# Patient Record
Sex: Female | Born: 1935 | Race: White | Hispanic: No | Smoking: Never smoker
Health system: Southern US, Community
[De-identification: ages and names within clinical notes are randomized; demographics above are authoritative.]

## PROBLEM LIST (undated history)

## (undated) DIAGNOSIS — I739 Peripheral vascular disease, unspecified: Secondary | ICD-10-CM

## (undated) DIAGNOSIS — I4891 Unspecified atrial fibrillation: Secondary | ICD-10-CM

## (undated) DIAGNOSIS — E7800 Pure hypercholesterolemia, unspecified: Secondary | ICD-10-CM

## (undated) DIAGNOSIS — F419 Anxiety disorder, unspecified: Secondary | ICD-10-CM

## (undated) DIAGNOSIS — I4819 Other persistent atrial fibrillation: Secondary | ICD-10-CM

## (undated) DIAGNOSIS — R609 Edema, unspecified: Secondary | ICD-10-CM

## (undated) DIAGNOSIS — K5730 Diverticulosis of large intestine without perforation or abscess without bleeding: Secondary | ICD-10-CM

## (undated) DIAGNOSIS — K449 Diaphragmatic hernia without obstruction or gangrene: Secondary | ICD-10-CM

## (undated) DIAGNOSIS — IMO0001 Reserved for inherently not codable concepts without codable children: Secondary | ICD-10-CM

## (undated) DIAGNOSIS — J8410 Pulmonary fibrosis, unspecified: Secondary | ICD-10-CM

## (undated) DIAGNOSIS — I872 Venous insufficiency (chronic) (peripheral): Secondary | ICD-10-CM

## (undated) DIAGNOSIS — I10 Essential (primary) hypertension: Secondary | ICD-10-CM

## (undated) DIAGNOSIS — M1990 Unspecified osteoarthritis, unspecified site: Secondary | ICD-10-CM

## (undated) DIAGNOSIS — Z8701 Personal history of pneumonia (recurrent): Secondary | ICD-10-CM

## (undated) DIAGNOSIS — C20 Malignant neoplasm of rectum: Secondary | ICD-10-CM

## (undated) DIAGNOSIS — M8580 Other specified disorders of bone density and structure, unspecified site: Secondary | ICD-10-CM

## (undated) DIAGNOSIS — R0989 Other specified symptoms and signs involving the circulatory and respiratory systems: Secondary | ICD-10-CM

## (undated) DIAGNOSIS — I341 Nonrheumatic mitral (valve) prolapse: Secondary | ICD-10-CM

## (undated) DIAGNOSIS — J45909 Unspecified asthma, uncomplicated: Secondary | ICD-10-CM

## (undated) DIAGNOSIS — Z950 Presence of cardiac pacemaker: Secondary | ICD-10-CM

## (undated) DIAGNOSIS — Z789 Other specified health status: Secondary | ICD-10-CM

## (undated) DIAGNOSIS — K219 Gastro-esophageal reflux disease without esophagitis: Secondary | ICD-10-CM

## (undated) DIAGNOSIS — I059 Rheumatic mitral valve disease, unspecified: Secondary | ICD-10-CM

## (undated) DIAGNOSIS — Z5189 Encounter for other specified aftercare: Secondary | ICD-10-CM

## (undated) HISTORY — DX: Diaphragmatic hernia without obstruction or gangrene: K44.9

## (undated) HISTORY — DX: Venous insufficiency (chronic) (peripheral): I87.2

## (undated) HISTORY — DX: Personal history of pneumonia (recurrent): Z87.01

## (undated) HISTORY — DX: Peripheral vascular disease, unspecified: I73.9

## (undated) HISTORY — DX: Rheumatic mitral valve disease, unspecified: I05.9

## (undated) HISTORY — PX: COLON SURGERY: SHX602

## (undated) HISTORY — DX: Malignant neoplasm of rectum: C20

## (undated) HISTORY — DX: Essential (primary) hypertension: I10

## (undated) HISTORY — DX: Reserved for inherently not codable concepts without codable children: IMO0001

## (undated) HISTORY — DX: Nonrheumatic mitral (valve) prolapse: I34.1

## (undated) HISTORY — DX: Pulmonary fibrosis, unspecified: J84.10

## (undated) HISTORY — DX: Anxiety disorder, unspecified: F41.9

## (undated) HISTORY — DX: Unspecified atrial fibrillation: I48.91

## (undated) HISTORY — DX: Other specified symptoms and signs involving the circulatory and respiratory systems: R09.89

## (undated) HISTORY — DX: Pure hypercholesterolemia, unspecified: E78.00

## (undated) HISTORY — DX: Presence of cardiac pacemaker: Z95.0

## (undated) HISTORY — DX: Diverticulosis of large intestine without perforation or abscess without bleeding: K57.30

## (undated) HISTORY — PX: APPENDECTOMY: SHX54

## (undated) HISTORY — DX: Unspecified asthma, uncomplicated: J45.909

## (undated) HISTORY — DX: Unspecified osteoarthritis, unspecified site: M19.90

## (undated) HISTORY — DX: Gastro-esophageal reflux disease without esophagitis: K21.9

## (undated) HISTORY — DX: Other specified disorders of bone density and structure, unspecified site: M85.80

## (undated) HISTORY — DX: Other persistent atrial fibrillation: I48.19

## (undated) HISTORY — DX: Other specified health status: Z78.9

## (undated) HISTORY — DX: Encounter for other specified aftercare: Z51.89

## (undated) HISTORY — DX: Edema, unspecified: R60.9

## (undated) HISTORY — PX: ECTOPIC PREGNANCY SURGERY: SHX613

---

## 1978-11-21 HISTORY — PX: VAGINAL HYSTERECTOMY: SUR661

## 1992-03-22 HISTORY — PX: NASAL SINUS SURGERY: SHX719

## 1993-03-22 HISTORY — PX: HAMMER TOE SURGERY: SHX385

## 1997-08-08 ENCOUNTER — Other Ambulatory Visit: Admission: RE | Admit: 1997-08-08 | Discharge: 1997-08-08 | Payer: Self-pay | Admitting: Obstetrics and Gynecology

## 1998-09-02 ENCOUNTER — Other Ambulatory Visit: Admission: RE | Admit: 1998-09-02 | Discharge: 1998-09-02 | Payer: Self-pay | Admitting: Obstetrics and Gynecology

## 1999-07-08 ENCOUNTER — Other Ambulatory Visit: Admission: RE | Admit: 1999-07-08 | Discharge: 1999-07-08 | Payer: Self-pay | Admitting: Obstetrics and Gynecology

## 2000-07-18 ENCOUNTER — Other Ambulatory Visit: Admission: RE | Admit: 2000-07-18 | Discharge: 2000-07-18 | Payer: Self-pay | Admitting: Obstetrics and Gynecology

## 2001-07-24 ENCOUNTER — Other Ambulatory Visit: Admission: RE | Admit: 2001-07-24 | Discharge: 2001-07-24 | Payer: Self-pay | Admitting: Obstetrics and Gynecology

## 2004-09-17 ENCOUNTER — Ambulatory Visit: Payer: Self-pay | Admitting: Pulmonary Disease

## 2004-10-07 ENCOUNTER — Ambulatory Visit: Payer: Self-pay | Admitting: Cardiology

## 2005-10-11 ENCOUNTER — Ambulatory Visit: Payer: Self-pay | Admitting: Pulmonary Disease

## 2005-10-18 ENCOUNTER — Ambulatory Visit: Payer: Self-pay | Admitting: Internal Medicine

## 2006-04-18 ENCOUNTER — Ambulatory Visit: Payer: Self-pay | Admitting: Pulmonary Disease

## 2006-04-18 LAB — CONVERTED CEMR LAB
AST: 27 units/L (ref 0–37)
Albumin: 3.9 g/dL (ref 3.5–5.2)
BUN: 16 mg/dL (ref 6–23)
Basophils Absolute: 0 10*3/uL (ref 0.0–0.1)
Calcium: 9.3 mg/dL (ref 8.4–10.5)
Chloride: 109 meq/L (ref 96–112)
Eosinophils Absolute: 0.2 10*3/uL (ref 0.0–0.6)
Eosinophils Relative: 2.4 % (ref 0.0–5.0)
GFR calc non Af Amer: 73 mL/min
Glucose, Bld: 109 mg/dL — ABNORMAL HIGH (ref 70–99)
HCT: 37.7 % (ref 36.0–46.0)
HDL: 67.8 mg/dL (ref >39.0)
Hemoglobin: 13.1 g/dL (ref 12.0–15.0)
MCHC: 34.8 g/dL (ref 30.0–36.0)
MCV: 93.3 fL (ref 78.0–100.0)
Neutro Abs: 3.8 10*3/uL (ref 1.4–7.7)
Neutrophils Relative %: 60.9 % (ref 43.0–77.0)
Potassium: 4.7 meq/L (ref 3.5–5.1)
Sodium: 143 meq/L (ref 135–145)
Total Protein: 7.3 g/dL (ref 6.0–8.3)
Triglycerides: 119 mg/dL (ref 0–149)
VLDL: 24 mg/dL (ref 0–40)

## 2006-06-27 ENCOUNTER — Ambulatory Visit: Payer: Self-pay | Admitting: Pulmonary Disease

## 2006-08-31 ENCOUNTER — Ambulatory Visit: Payer: Self-pay | Admitting: Pulmonary Disease

## 2006-10-17 ENCOUNTER — Ambulatory Visit: Payer: Self-pay | Admitting: Pulmonary Disease

## 2006-10-17 LAB — CONVERTED CEMR LAB
ALT: 11 units/L (ref 0–35)
Albumin: 4.1 g/dL (ref 3.5–5.2)
Alkaline Phosphatase: 68 units/L (ref 39–117)
Bilirubin, Direct: 0.1 mg/dL (ref 0.0–0.3)
Cholesterol: 183 mg/dL (ref 0–200)
Creatinine, Ser: 0.8 mg/dL (ref 0.4–1.2)
HDL: 65.2 mg/dL (ref >39.0)
LDL Cholesterol: 96 mg/dL (ref 0–99)
Potassium: 5.6 meq/L — ABNORMAL HIGH (ref 3.5–5.1)
Triglycerides: 111 mg/dL (ref 0–149)
VLDL: 22 mg/dL (ref 0–40)

## 2006-10-24 ENCOUNTER — Ambulatory Visit: Payer: Self-pay | Admitting: Pulmonary Disease

## 2006-10-24 LAB — CONVERTED CEMR LAB: Potassium: 4.7 meq/L (ref 3.5–5.1)

## 2007-04-19 DIAGNOSIS — M1990 Unspecified osteoarthritis, unspecified site: Secondary | ICD-10-CM | POA: Insufficient documentation

## 2007-04-19 DIAGNOSIS — E7800 Pure hypercholesterolemia, unspecified: Secondary | ICD-10-CM | POA: Insufficient documentation

## 2007-04-19 DIAGNOSIS — K219 Gastro-esophageal reflux disease without esophagitis: Secondary | ICD-10-CM

## 2007-04-19 DIAGNOSIS — F411 Generalized anxiety disorder: Secondary | ICD-10-CM

## 2007-04-19 DIAGNOSIS — J209 Acute bronchitis, unspecified: Secondary | ICD-10-CM

## 2007-04-19 DIAGNOSIS — T7840XA Allergy, unspecified, initial encounter: Secondary | ICD-10-CM | POA: Insufficient documentation

## 2007-04-19 HISTORY — DX: Pure hypercholesterolemia, unspecified: E78.00

## 2007-04-20 ENCOUNTER — Ambulatory Visit: Payer: Self-pay | Admitting: Pulmonary Disease

## 2007-04-20 DIAGNOSIS — I059 Rheumatic mitral valve disease, unspecified: Secondary | ICD-10-CM | POA: Insufficient documentation

## 2007-04-20 HISTORY — DX: Rheumatic mitral valve disease, unspecified: I05.9

## 2007-07-10 ENCOUNTER — Encounter: Payer: Self-pay | Admitting: Adult Health

## 2007-07-10 ENCOUNTER — Ambulatory Visit: Payer: Self-pay | Admitting: Internal Medicine

## 2007-07-10 DIAGNOSIS — N3000 Acute cystitis without hematuria: Secondary | ICD-10-CM | POA: Insufficient documentation

## 2007-07-10 LAB — CONVERTED CEMR LAB
Bilirubin Urine: NEGATIVE
Crystals: NEGATIVE
RBC / HPF: NONE SEEN
Specific Gravity, Urine: <=1.005 (ref 1.000–1.03)
pH: 6.5 (ref 5.0–8.0)

## 2007-08-21 ENCOUNTER — Encounter: Payer: Self-pay | Admitting: Pulmonary Disease

## 2007-10-13 ENCOUNTER — Encounter: Payer: Self-pay | Admitting: Adult Health

## 2007-10-19 ENCOUNTER — Telehealth (INDEPENDENT_AMBULATORY_CARE_PROVIDER_SITE_OTHER): Payer: Self-pay | Admitting: *Deleted

## 2007-10-19 ENCOUNTER — Ambulatory Visit: Payer: Self-pay | Admitting: Internal Medicine

## 2007-11-03 ENCOUNTER — Ambulatory Visit: Payer: Self-pay | Admitting: Pulmonary Disease

## 2008-02-01 ENCOUNTER — Ambulatory Visit: Payer: Self-pay | Admitting: Pulmonary Disease

## 2008-02-02 LAB — CONVERTED CEMR LAB
AST: 21 units/L (ref 0–37)
BUN: 15 mg/dL (ref 6–23)
Basophils Absolute: 0 10*3/uL (ref 0.0–0.1)
Basophils Relative: 0.8 % (ref 0.0–3.0)
CO2: 29 meq/L (ref 19–32)
Calcium: 9.1 mg/dL (ref 8.4–10.5)
Cholesterol: 189 mg/dL (ref 0–200)
Creatinine, Ser: 0.8 mg/dL (ref 0.4–1.2)
Eosinophils Relative: 4 % (ref 0.0–5.0)
GFR calc Af Amer: 88 mL/min
Glucose, Bld: 102 mg/dL — ABNORMAL HIGH (ref 70–99)
Hemoglobin: 13.2 g/dL (ref 12.0–15.0)
Lymphocytes Relative: 27.8 % (ref 12.0–46.0)
MCHC: 34.3 g/dL (ref 30.0–36.0)
Monocytes Relative: 7.4 % (ref 3.0–12.0)
Platelets: 220 10*3/uL (ref 150–400)
RDW: 12.4 % (ref 11.5–14.6)
Sodium: 142 meq/L (ref 135–145)
Total Protein: 7.7 g/dL (ref 6.0–8.3)

## 2008-02-13 ENCOUNTER — Telehealth (INDEPENDENT_AMBULATORY_CARE_PROVIDER_SITE_OTHER): Payer: Self-pay | Admitting: *Deleted

## 2008-02-28 LAB — CONVERTED CEMR LAB: Vit D, 1,25-Dihydroxy: 28 — ABNORMAL LOW (ref 30–89)

## 2008-07-31 ENCOUNTER — Ambulatory Visit: Payer: Self-pay | Admitting: Pulmonary Disease

## 2008-07-31 DIAGNOSIS — J189 Pneumonia, unspecified organism: Secondary | ICD-10-CM

## 2008-07-31 DIAGNOSIS — G589 Mononeuropathy, unspecified: Secondary | ICD-10-CM | POA: Insufficient documentation

## 2008-07-31 DIAGNOSIS — K449 Diaphragmatic hernia without obstruction or gangrene: Secondary | ICD-10-CM

## 2008-07-31 DIAGNOSIS — J8410 Pulmonary fibrosis, unspecified: Secondary | ICD-10-CM

## 2008-07-31 HISTORY — DX: Diaphragmatic hernia without obstruction or gangrene: K44.9

## 2008-07-31 HISTORY — DX: Pulmonary fibrosis, unspecified: J84.10

## 2008-08-02 ENCOUNTER — Ambulatory Visit: Payer: Self-pay | Admitting: Pulmonary Disease

## 2008-08-04 LAB — CONVERTED CEMR LAB
ALT: 12 units/L (ref 0–35)
AST: 22 units/L (ref 0–37)
Albumin: 3.8 g/dL (ref 3.5–5.2)
BUN: 13 mg/dL (ref 6–23)
CO2: 30 meq/L (ref 19–32)
Creatinine, Ser: 0.8 mg/dL (ref 0.4–1.2)
Eosinophils Absolute: 0.4 10*3/uL (ref 0.0–0.7)
Eosinophils Relative: 6.6 % — ABNORMAL HIGH (ref 0.0–5.0)
GFR calc non Af Amer: 72.67 mL/min (ref >60)
HDL: 75.3 mg/dL (ref >39.00)
MCHC: 34.5 g/dL (ref 30.0–36.0)
MCV: 94.9 fL (ref 78.0–100.0)
Neutro Abs: 3.6 10*3/uL (ref 1.4–7.7)
Neutrophils Relative %: 60.2 % (ref 43.0–77.0)
Platelets: 229 10*3/uL (ref 150.0–400.0)
Potassium: 4.2 meq/L (ref 3.5–5.1)
Sodium: 146 meq/L — ABNORMAL HIGH (ref 135–145)
Total CHOL/HDL Ratio: 2
Total Protein: 7.2 g/dL (ref 6.0–8.3)
WBC: 5.9 10*3/uL (ref 4.5–10.5)

## 2008-08-23 ENCOUNTER — Encounter: Payer: Self-pay | Admitting: Pulmonary Disease

## 2009-01-29 ENCOUNTER — Ambulatory Visit: Payer: Self-pay | Admitting: Pulmonary Disease

## 2009-08-28 ENCOUNTER — Encounter: Payer: Self-pay | Admitting: Pulmonary Disease

## 2009-09-10 ENCOUNTER — Ambulatory Visit: Payer: Self-pay | Admitting: Pulmonary Disease

## 2009-09-14 LAB — CONVERTED CEMR LAB
AST: 19 units/L (ref 0–37)
Alkaline Phosphatase: 56 units/L (ref 39–117)
BUN: 16 mg/dL (ref 6–23)
Basophils Relative: 0.5 % (ref 0.0–3.0)
Bilirubin Urine: NEGATIVE
Bilirubin, Direct: 0.1 mg/dL (ref 0.0–0.3)
Calcium: 9.2 mg/dL (ref 8.4–10.5)
Chloride: 108 meq/L (ref 96–112)
Cholesterol: 183 mg/dL (ref 0–200)
Creatinine, Ser: 0.8 mg/dL (ref 0.4–1.2)
Eosinophils Absolute: 0.2 10*3/uL (ref 0.0–0.7)
Hemoglobin: 12.5 g/dL (ref 12.0–15.0)
Ketones, ur: NEGATIVE mg/dL
LDL Cholesterol: 88 mg/dL (ref 0–99)
Lymphs Abs: 1.3 10*3/uL (ref 0.7–4.0)
MCHC: 33.4 g/dL (ref 30.0–36.0)
MCV: 97.1 fL (ref 78.0–100.0)
Monocytes Absolute: 0.4 10*3/uL (ref 0.1–1.0)
Neutro Abs: 3.6 10*3/uL (ref 1.4–7.7)
RBC: 3.85 M/uL — ABNORMAL LOW (ref 3.87–5.11)
Total CHOL/HDL Ratio: 2
Urine Glucose: NEGATIVE mg/dL
Urobilinogen, UA: 0.2 (ref 0.0–1.0)

## 2009-09-19 ENCOUNTER — Encounter: Payer: Self-pay | Admitting: Pulmonary Disease

## 2009-09-19 ENCOUNTER — Ambulatory Visit: Payer: Self-pay | Admitting: Internal Medicine

## 2010-04-01 ENCOUNTER — Ambulatory Visit
Admission: RE | Admit: 2010-04-01 | Discharge: 2010-04-01 | Payer: Self-pay | Source: Home / Self Care | Attending: Pulmonary Disease | Admitting: Pulmonary Disease

## 2010-04-21 NOTE — Assessment & Plan Note (Signed)
Summary: 6 months/apc   CC:  6 month ROV & review of mult medical problems....  History of Present Illness: 77 y/o WF here for a follow up visit... she has a hx of AB & is a never smoker, and uses PROAIR Prn... she has hyperlipidemia on LIPITOR, and a hx of GERD controlled on PRILOSEC... she has repeatedly refused routine colonoscopy despite efforts to get her to do so...   ~  May10:  she's had a good 56mo- still plays golf regularly (she shot a 55 yest)... c/o neuropathy discomfort in LE's and friend w/ similar symptoms takes Gabapentin> tried it, no benefit.  ~  Nov10:  she had a good 6 mo- went to [place] w/ her daughter... no new complaints or concerns... she has lost 15# on her diet down to 148# (eating less, appetite is pretty good, etc)... OK flu shot today.   ~  September 10, 2009:  another good 59mo- no new complaints or concerns, in good spirits & feeling well... notes it's too hot to golf, but exerc w/ walking etc... GYN= DrHenley & BMD is due... also due for TDAP today.   Current Problem List:   ** cell # [phone]  ALLERGY (ICD-995.3) - uses OTC meds as needed.  ASTHMATIC BRONCHITIS, ACUTE (ICD-466.0) - uses PROAIR as needed, but doing well, breathing OK.  ~  7/09:  AB exac Rx w/ Pred, Avelox, Mucinex, etc... symptoms resolved.  ~  CXR 8/09 looked like her baseline film w/ scarring, chr changes, HH seen, & NAD.Marland Kitchen.  ~  CXR 5/10 & 6/11>  no change...  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 exam showed bibasilar velcro rales about 1/3rd way up> no change.  MITRAL VALVE PROLAPSE (ICD-424.0) - on ASA 81mg /d... had 2DEcho 2/04 showing MVP, mild MR, normal LVF... StressEcho w/ EF=60%... subseq Cardiolite 12/03 was normal w/o ischemia and EF=65%... denies CP, palpit, dizzy, syncope, edema, etc... she is active- still golfs etc.  HYPERCHOLESTEROLEMIA (ICD-272.0) - controlled on LIPITOR 20mg /d,  and tol well...   ~   labs 7/08 showed TChol 183, TG 111, HDL 65, LDL 96  ~  FLP 11/09 showed TChol 189, TG 70, HDL 71, LDL 104  ~  FLP 5/10 showed TChol 173, TG 53, HDL 75, LDL 87  ~  FLP 6/11 showed TChol 183, TG 103, HDL 74, LDL 88  HIATAL HERNIA (ICD-553.3) & GERD (ICD-530.81) - she has a HH seen on CXR and CT Chest... takes OMEPRAZOLE 20mg /d and denies heartburn, GERD, regurg, dysphagia, etc... pt never had EGD or Colonoscopy... she has repeatedly refused to sched a colonoscopy!  ~  11/10:  denies abd pain, N/V, & notes norm BM's, etc...  DEGENERATIVE JOINT DISEASE (ICD-715.90) - mild in fingers, no other complaints x "I can't make quick turns anymore"... she notes the Tonic Water Qhs works great for her nightime cramps, tried Neurontin for ?neuropathy symptoms but no benefit & she stopped it...  OSTEOPENIA (ICD-733.90) - BMDs here in 2003, 2005, 2007- showed osteopenia/ osteoporosis w/ TScores -1.9 to -2.5.Marland KitchenMarland Kitchen on FOSAMAX, Ca++, Vits... she is due for f/u BMD (if not at [facility] office, then get it here)...  ANXIETY (ICD-300.00)  Health Maintenance - GYN= DrHenley & follow up [place] w/ neg exam she says and stool cards were neg by her report... last Mammogram 6/10 at [facility] was neg... she refuses to consider a colonoscopy... OK 2010 flu vaccine 11/10.Marland KitchenMarland Kitchen  had PNEUMOVAX 2009... OK TDAP 6/11...   Preventive Screening-Counseling & Management  Alcohol-Tobacco     Smoking Status: never  Allergies: 1)  ! Sulfa  Comments:  Nurse/Medical Assistant: The patient's medications and allergies were reviewed with the patient and were updated in the Medication and Allergy Lists.  Past History:  Past Medical History: ALLERGY (ICD-995.3) ASTHMATIC BRONCHITIS, ACUTE (ICD-466.0) Hx of PNEUMONIA, RIGHT MIDDLE LOBE (ICD-486) PULMONARY FIBROSIS, POSTINFLAMMATORY (ICD-515) MITRAL VALVE PROLAPSE (ICD-424.0) HYPERCHOLESTEROLEMIA (ICD-272.0) HIATAL HERNIA (ICD-553.3) GERD (ICD-530.81) DEGENERATIVE JOINT DISEASE  (ICD-715.90) OSTEOPENIA (ICD-733.90) ANXIETY (ICD-300.00)  Past Surgical History: S/P hysterectomy in the 1980's S/P sinus surgery in 1994 S/P hammertoe surgery by DrGioffre in 1995  Family History: Reviewed history from 04/20/2007 and no changes required. Father died age 39 w/ MI, hx strokes, prostate cancer. Mother died age 8 3 Sibs: 2Bro- one died w/ lung cancer, one alive w/ hx stroke             1Sis- good general health  Social History: Reviewed history from 04/20/2007 and no changes required. 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  The patient denies anorexia, fever, weight loss, weight gain, vision loss, decreased hearing, hoarseness, chest pain, syncope, peripheral edema, prolonged cough, headaches, hemoptysis, abdominal pain, melena, hematochezia, severe indigestion/heartburn, hematuria, incontinence, muscle weakness, suspicious skin lesions, transient blindness, difficulty walking, depression, unusual weight change, abnormal bleeding, enlarged lymph nodes, and angioedema.    Vital Signs:  Patient profile:   77 year old female Height:      67.5 inches Weight:      146 pounds BMI:     22.61 O2 Sat:      96 % on Room air Temp:     97.7 degrees F oral Pulse rate:   72 / minute BP sitting:   130 / 70  (left arm) Cuff size:   regular  Vitals Entered By: Elita Boone CMA (September 10, 2009 9:55 AM)  O2 Sat at Rest %:  96 O2 Flow:  Room air CC: 6 month ROV & review of mult medical problems... Is Patient Diabetic? No Pain Assessment Patient in pain? no      Comments meds updated today with pt   Physical Exam  Additional Exam:  WD, WN, 77 y/o WF in NAD... GENERAL:  Alert & oriented; pleasant & cooperative... HEENT:  [place]/AT, Glasses, EOM-wnl, PERRLA, EACs-clear, TMs-wnl, NOSE-clear, THROAT-clear & wnl. NECK:  Supple w/ fair ROM; no JVD; normal carotid impulses w/o  bruits; no thyromegaly or nodules palpated; no lymphadenopathy. CHEST:  few bibasilar dry rales about 1/3rd way up, no wheezing, no rhonchi, no cough, etc... HEART:  Regular Rhythm; without murmurs/ rubs/ or gallops, no msc heard... ABDOMEN:  Soft & nontender; normal bowel sounds; no organomegaly or masses detected. EXT:  no deformities, mild arthritic changes; no varicose veins/ +venous insuffic/ no edema. NEURO:  CN's intact; no focal neuro deficits... DERM:  No lesions noted; no rash etc...    CXR  Procedure date:  09/10/2009  Findings:      CHEST - 2 VIEW Comparison: Chest x-ray of 07/31/2008   Findings: The lungs are clear but hyperaerated consistent with COPD.  Mild cardiomegaly is stable.  There is lucency overlying the midline probably representing a moderate sized hiatal hernia.  No acute bony abnormality is seen.   IMPRESSION: COPD.  Cardiomegaly.  Probable hiatal  hernia.   Read By:  Joretta Bachelor,  M.D.   MISC. Report  Procedure date:  09/10/2009  Findings:      BMP (METABOL)   Sodium                    144 mEq/L                   135-145   Potassium            [H]  5.5 mEq/L                   3.5-5.1   Chloride                  108 mEq/L                   96-112   Carbon Dioxide            31 mEq/L                    19-32   Glucose                   96 mg/dL                    70-99   BUN                       16 mg/dL                    6-23   Creatinine                0.8 mg/dL                   0.4-1.2   Calcium                   9.2 mg/dL                   8.4-10.5   GFR                       74.62 mL/min                >60  Lipid Panel (LIPID)   Cholesterol               183 mg/dL                   0-200   Triglycerides             103.0 mg/dL                 0.0-149.0   HDL                       74.30 mg/dL                 >39.00   LDL Cholesterol           88 mg/dL                    0-99  Hepatic/Liver Function Panel (HEPATIC)   Total Bilirubin            0.9 mg/dL                   0.3-1.2  Direct Bilirubin          0.1 mg/dL                   0.0-0.3   Alkaline Phosphatase      56 U/L                      39-117   AST                       19 U/L                      0-37   ALT                       9 U/L                       0-35   Total Protein             7.8 g/dL                    6.0-8.3   Albumin                   4.2 g/dL                    3.5-5.2  TSH (TSH)   FastTSH                   3.28 uIU/mL                 0.35-5.50  Comments:      CBC Platelet w/Diff (CBCD)   White Cell Count          5.6 K/uL                    4.5-10.5   Red Cell Count       [L]  3.85 Mil/uL                 3.87-5.11   Hemoglobin                12.5 g/dL                   12.0-15.0   Hematocrit                37.4 %                      36.0-46.0   MCV                       97.1 fl                     78.0-100.0   Platelet Count            224.0 K/uL                  150.0-400.0   Neutrophil %              64.4 %                      43.0-77.0   Lymphocyte %              23.1 %  12.0-46.0   Monocyte %                8.0 %                       3.0-12.0   Eosinophils%              4.0 %                       0.0-5.0   Basophils %               0.5 %                       0.0-3.0   UDip w/Micro (URINE)   Color                     LT. YELLOW   Clarity                   CLEAR                       Clear   Specific Gravity          1.020                       1.000 - 1.030   Urine Ph                  6.0                         5.0-8.0   Protein                   30                          Negative   Urine Glucose             NEGATIVE                    Negative   Ketones                   NEGATIVE                    Negative   Urine Bilirubin           NEGATIVE                    Negative   Blood                     TRACE-INTACT                Negative   Urobilinogen              0.2                         0.0 -  1.0   Leukocyte Esterace        LARGE                       Negative   Nitrite                   POSITIVE  Negative   Urine WBC                 TNTC(>50/hpf)               0-2/hpf   Urine RBC                 0-2/hpf                     0-2/hpf   Urine Epith               Rare(0-4/hpf)               Rare(0-4/hpf)   Urine Bacteria            Many(>50/hpf)    Impression & Recommendations:  Problem # 1:  ASTHMATIC BRONCHITIS, ACUTE (ICD-466.0) Stable> fibrotic rales bases w/o change... Her updated medication list for this problem includes:    Proair Hfa 108 (90 Base) Mcg/act Aers (Albuterol sulfate) .Marland Kitchen... 1-2 sprays q4-6h as needed for wheezing...  Orders: T-2 View CXR ([id number])  Problem # 2:  MITRAL VALVE PROLAPSE (ICD-424.0) Stable mild cardiomeg, denies CP/ palpit, exercises w/ walking etc... Her updated medication list for this problem includes:    Adult Aspirin Low Strength 81 Mg Tbdp (Aspirin) ..... Once daily  Problem # 3:  HYPERCHOLESTEROLEMIA (ICD-272.0) Stable on the Lip20... Her updated medication list for this problem includes:    Lipitor 20 Mg Tabs (Atorvastatin calcium) .Marland Kitchen... Take one tablet by mouth at bedtime  Problem # 4:  HIATAL HERNIA (ICD-553.3) Mod HH/ GERD> continue PPI Rx and avoid reflux etc... Her updated medication list for this problem includes:    Prilosec Otc 20 Mg Tbec (Omeprazole magnesium) .Marland Kitchen... 1 tab by mouth once daily taken 30 min before the first meal of the day...  Problem # 5:  OSTEOPENIA (ICD-733.90) She is due for another BMD (?53yrs since last) & poss drug holiday... Her updated medication list for this problem includes:    Fosamax 70 Mg Tabs (Alendronate sodium) .Marland Kitchen... Take one tablet by mouth every week  Problem # 6:  OTHER MEDICAL PROBLEMS AS NOTED>>>  Complete Medication List: 1)  Adult Aspirin Low Strength 81 Mg Tbdp (Aspirin) .... Once daily 2)  Proair Hfa 108 (90 Base) Mcg/act Aers (Albuterol sulfate) .Marland Kitchen.. 1-2  sprays q4-6h as needed for wheezing... 3)  Lipitor 20 Mg Tabs (Atorvastatin calcium) .... Take one tablet by mouth at bedtime 4)  Prilosec Otc 20 Mg Tbec (Omeprazole magnesium) .Marland Kitchen.. 1 tab by mouth once daily taken 30 min before the first meal of the day... 5)  Fosamax 70 Mg Tabs (Alendronate sodium) .... Take one tablet by mouth every week 6)  Calcium Carbonate-vitamin D 600-200 Mg-unit Tabs (Calcium carbonate-vitamin d) .... Two times a day 7)  Vitamin B-12 250 Mcg Tabs (Cyanocobalamin) .... Once daily 8)  Vitamin C 500 Mg Tabs (Ascorbic acid) .... Once daily 9)  Vitamin E 1000 Unit Caps (Vitamin e) .... Take 1 tablet by mouth once a day 10)  Protopic 0.1 % Oint (Tacrolimus) .... Apply as directed  Patient Instructions: 1)  Today we updated your med list- see below.... 2)  Continue your current m,eds the same... 3)  Today we did your follow up CXR & FASTING blood work... 4)  please call the "phone tree" in a few days for your lab results.Marland KitchenMarland Kitchen 5)  Call for any problems.Marland KitchenMarland Kitchen 6)  Please schedule a follow-up appointment in 6 months.

## 2010-04-21 NOTE — Miscellaneous (Signed)
Summary: BONE DENSITY  Clinical Lists Changes  Orders: Added new Test order of T-Bone Densitometry (77080) - Signed Added new Test order of T-Lumbar Vertebral Assessment (77082) - Signed 

## 2010-04-23 NOTE — Assessment & Plan Note (Signed)
Summary: rov 6 months///kp   CC:  6 month ROV & review of mult medical problems....  History of Present Illness: 77 y/o WF here for a follow up visit... she has a hx of AB & is a never-smoker, uses PROAIR Prn... she has hyperlipidemia on LIPITOR, and a hx of GERD controlled on PRILOSEC... she has repeatedly refused routine colonoscopy despite efforts to get her to do so...   ~  May10:  she's had a good 46mo- still plays golf regularly... c/o neuropathy discomfort in LE's and friend w/ similar symptoms takes Gabapentin ==> tried it, no benefit.  ~  Nov10:  she had a good 6 mo- went to [place] w/ her daughter... no new complaints or concerns... she has lost 15# on her diet down to 148# (eating less, appetite is pretty good, etc)... OK flu shot today.  ~  [id number]:  another good 54mo- no new complaints or concerns, in good spirits & feeling well... notes it's too hot to golf, but exerc w/ walking etc... GYN= [facility] Mercado & BMD is due... also due for TDAP today.   ~  April 01, 2010:  she had BMD in our office 7/11> osteopenia w/ TScores -2.3 in Lumbar Spine & -2.3 in right FemNeck> on Fosamax, Calcium, MVI, Vit D 1000 u daily...  she once again notes the Neuropathy symptoms in her legs- esp at night & wants to try the Gabapentin again, therefore we will start w/ 100mg  Qhs & titrate up to 300mg .    Current Problem List:   ** cell # [phone]  ALLERGY (ICD-995.3) - uses OTC meds as needed.  ASTHMATIC BRONCHITIS, ACUTE (ICD-466.0) - uses PROAIR as needed, but doing well, breathing OK.  ~  7/09:  AB exac Rx w/ Pred, Avelox, Mucinex, etc... symptoms resolved.  ~  CXR 8/09 looked like her baseline film w/ scarring, chr changes, HH seen, & NAD.Marland Kitchen.  ~  CXR 5/10 & 6/11>  no change...  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 exam showed bibasilar velcro rales about 1/3rd way up> no change.  MITRAL VALVE PROLAPSE (ICD-424.0) - on ASA  81mg /d... had 2DEcho 2/04 showing MVP, mild MR, normal LVF... StressEcho w/ EF=60%... subseq Cardiolite 12/03 was normal w/o ischemia and EF=65%... denies CP, palpit, dizzy, syncope, edema, etc... she is active- still golfs etc.  HYPERCHOLESTEROLEMIA (ICD-272.0) - controlled on LIPITOR 20mg /d,  and tol well...   ~  labs 7/08 showed TChol 183, TG 111, HDL 65, LDL 96  ~  FLP 11/09 showed TChol 189, TG 70, HDL 71, LDL 104  ~  FLP 5/10 showed TChol 173, TG 53, HDL 75, LDL 87  ~  FLP 6/11 showed TChol 183, TG 103, HDL 74, LDL 88  HIATAL HERNIA (ICD-553.3) & GERD (ICD-530.81) - she has a HH seen on CXR and CT Chest... takes OMEPRAZOLE 20mg /d and denies heartburn, GERD, regurg, dysphagia, etc... pt never had EGD or Colonoscopy... she has repeatedly refused to sched a colonoscopy!  ~  1/12:  she continues to deny abd pain, N/V, etc> & notes norm BM's, etc...  DEGENERATIVE JOINT DISEASE (ICD-715.90) - mild in fingers, no other complaints x "I can't make quick turns anymore"... she notes the Tonic Water Qhs works great for her nightime cramps, tried Neurontin for ?neuropathy symptoms but no benefit & she stopped it...  OSTEOPENIA (ICD-733.90) - BMDs here in 2003, 2005, 2007- showed osteopenia/ osteoporosis w/ TScores -  1.9 to -2.5.Marland KitchenMarland Kitchen on FOSAMAX, Ca++, Vits...  ~  labs 11/09 showed Vit D level = 28... rec to take OTC supplement 1000 u daily.  ~  BMD here 7/11 showed TScores -2.3 in Lumbar Spine, and -2.3 in right FemNeck... continue Rx.  ANXIETY (ICD-300.00)  Health Maintenance - GYN= [facility] & follow up [facility] w/ neg exam she says and stool cards were neg by her report... last Mammogram 6/10 at [facility] was neg... she refuses to consider a colonoscopy... OK 2010 flu vaccine 11/10... had PNEUMOVAX 2009... OK TDAP 6/11...   Allergies (verified): 1)  ! Sulfa  Comments:  Nurse/Medical Assistant: The patient's medications and allergies were reviewed with the patient and were updated in the Medication  and Allergy Lists. Hannah Poisson CNA/MA  April 01, 2010 10:09 AM   Past History:  Past Medical History: ALLERGY (ICD-995.3) ASTHMATIC BRONCHITIS, ACUTE (ICD-466.0) Hx of PNEUMONIA, RIGHT MIDDLE LOBE (ICD-486) PULMONARY FIBROSIS, POSTINFLAMMATORY (ICD-515) MITRAL VALVE PROLAPSE (ICD-424.0) HYPERCHOLESTEROLEMIA (ICD-272.0) HIATAL HERNIA (ICD-553.3) GERD (ICD-530.81) DEGENERATIVE JOINT DISEASE (ICD-715.90) OSTEOPENIA (ICD-733.90) ANXIETY (ICD-300.00)  Past Surgical History: S/P hysterectomy in the 1980's S/P sinus surgery in 1994 S/P hammertoe surgery by [facility] Mercado in 1995  Family History: Reviewed history from 04/20/2007 and no changes required. Father died age 28 w/ MI, hx strokes, prostate cancer. Mother died age 50 3 Sibs: 2Bro- one died w/ lung cancer, one alive w/ hx stroke             1Sis- good general health  Social History: Reviewed history from 04/20/2007 and no changes required. 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    Vital Signs:  Patient profile:   77 year old female Height:      67.5 inches Weight:      146.38 pounds BMI:     22.67 O2 Sat:      97 % on Room air Temp:     97.3 degrees F oral Pulse rate:   77 / minute BP sitting:   140 / 80  (left arm) Cuff size:   regular  Vitals Entered By: Hannah Poisson CNA/MA (April 01, 2010 10:08 AM)  O2 Flow:  Room air 77  Physical Exam  Additional Exam:  WD, WN, 77 y/o WF in NAD... GENERAL:   Alert & oriented; pleasant & cooperative... HEENT:  Hannah Mercado/AT, Glasses, EOM-wnl, PERRLA, EACs-clear, TMs-wnl, NOSE-clear, THROAT-clear & wnl. NECK:  Supple w/ fair ROM; no JVD; normal carotid impulses w/o bruits; no thyromegaly or nodules palpated; no lymphadenopathy. CHEST:  few bibasilar dry rales about 1/3rd way up, no wheezing, no rhonchi, no cough, etc... HEART:  Regular Rhythm; without murmurs/ rubs/ or gallops, no msc heard... ABDOMEN:  Soft & nontender; normal bowel sounds; no organomegaly or masses detected. EXT:  no deformities, mild arthritic changes; no varicose veins/ +venous insuffic/ no edema. NEURO:  CN's intact; no focal neuro deficits... DERM:  No lesions noted; no rash etc...    Impression & Recommendations:  Problem # 1:  ASTHMATIC BRONCHITIS, ACUTE (ICD-466.0) Stable w/o acute exac recently... The following medications were removed from the medication list:    Cipro 250  Mg Tabs (Ciprofloxacin hcl) .Marland Kitchen... Take one tablet by mouth two times a day until gone Her updated medication list for this problem includes:    Proair Hfa 108 (90 Base) Mcg/act Aers (Albuterol sulfate) .Marland Kitchen... 1-2 sprays q4-6h as needed for wheezing...  Problem # 2:  PULMONARY FIBROSIS, POSTINFLAMMATORY (ICD-515) Stable subjectively & by exam... due for f/u CXR 6/12...  Problem # 3:  MITRAL VALVE PROLAPSE (ICD-424.0) Stable w/o CP, palpit, SOB, edema, etc... Her updated medication list for this problem includes:    Adult Aspirin Low Strength 81 Mg Tbdp (Aspirin) ..... Once daily  Problem # 4:  HYPERCHOLESTEROLEMIA (ICD-272.0) Stable on the Lip20... Her updated medication list for this problem includes:    Lipitor 20 Mg Tabs (Atorvastatin calcium) .Marland Kitchen... Take one tablet by mouth at bedtime  Problem # 5:  GERD (ICD-530.81) GI is stable as well... Her updated medication list for this problem includes:    Prilosec Otc 20 Mg Tbec (Omeprazole magnesium) .Marland Kitchen... 1 tab by mouth once daily taken 30 min before  the first meal of the day...  Problem # 6:  OSTEOPENIA (ICD-733.90) DJD, Osteopenia>  continue Fosamax, MVI, Vit D... and wt bearing exerc... Her updated medication list for this problem includes:    Fosamax 70 Mg Tabs (Alendronate sodium) .Marland Kitchen... Take one tablet by mouth every week  Problem # 7:  OTHER MEDICAL PROBLEMS AS NOTED...  Complete Medication List: 1)  Adult Aspirin Low Strength 81 Mg Tbdp (Aspirin) .... Once daily 2)  Proair Hfa 108 (90 Base) Mcg/act Aers (Albuterol sulfate) .Marland Kitchen.. 1-2 sprays q4-6h as needed for wheezing... 3)  Lipitor 20 Mg Tabs (Atorvastatin calcium) .... Take one tablet by mouth at bedtime 4)  Prilosec Otc 20 Mg Tbec (Omeprazole magnesium) .Marland Kitchen.. 1 tab by mouth once daily taken 30 min before the first meal of the day... 5)  Fosamax 70 Mg Tabs (Alendronate sodium) .... Take one tablet by mouth every week 6)  Calcium Carbonate-vitamin D 600-200 Mg-unit Tabs (Calcium carbonate-vitamin d) .... Two times a day 7)  Vitamin B-12 250 Mcg Tabs (Cyanocobalamin) .... Once daily 8)  Vitamin C 500 Mg Tabs (Ascorbic acid) .... Once daily 9)  Vitamin D3 2000 Unit Caps (Cholecalciferol) .... Take 1 cap by mouth once daily... 10)  Vitamin E 1000 Unit Caps (Vitamin e) .... Take 1 tablet by mouth once a day 11)  Protopic 0.1 % Oint (Tacrolimus) .... Apply as directed 12)  Gabapentin 100 Mg Caps (Gabapentin) .... Take 1 tab by mouth at bedtime for 1 week & slowly increase to 3 tabs as directed...  Patient Instructions: 1)  Today we updated your med list- see below.... 2)  We refilled your meds and we wrote a new perscription for GABAPENTIN 100mg  >> start w/ one cap at bedtime for 1 week or so, then increase to 2 caps, then 3 if necessary.Marland KitchenMarland Kitchen 3)  Call for any questions.Marland KitchenMarland Kitchen 4)  Please schedule a follow-up appointment in 6 months, & we will plan to check your fasting blood work at that time... Prescriptions: GABAPENTIN 100 MG CAPS (GABAPENTIN) take 1 tab by mouth at bedtime for 1 week &  slowly increase to 3 tabs as directed...  #90 x 12   Entered and Authorized by:   Noralee Space MD   Signed by:   Noralee Space MD on 04/01/2010   Method used:   Print then Give to Patient   RxID:   [id number] FOSAMAX 70 MG  TABS (ALENDRONATE SODIUM) take one  tablet by mouth every week  #4 x 12   Entered and Authorized by:   Noralee Space MD   Signed by:   Noralee Space MD on 04/01/2010   Method used:   Print then Give to Patient   RxID:   [id number] LIPITOR 20 MG  TABS (ATORVASTATIN CALCIUM) take one tablet by mouth at bedtime  #30 x 12   Entered and Authorized by:   Noralee Space MD   Signed by:   Noralee Space MD on 04/01/2010   Method used:   Print then Give to Patient   RxID:   [id number]    Immunization History:  Influenza Immunization History:    Influenza:  historical (12/20/2009)  Pneumovax Immunization History:    Pneumovax:  historical (03/22/2005)

## 2010-08-19 ENCOUNTER — Other Ambulatory Visit: Payer: Self-pay | Admitting: Pulmonary Disease

## 2010-09-18 ENCOUNTER — Encounter: Payer: Self-pay | Admitting: Pulmonary Disease

## 2010-10-01 ENCOUNTER — Ambulatory Visit: Payer: Self-pay | Admitting: Pulmonary Disease

## 2010-10-09 ENCOUNTER — Encounter: Payer: Self-pay | Admitting: Pulmonary Disease

## 2010-10-09 ENCOUNTER — Ambulatory Visit (INDEPENDENT_AMBULATORY_CARE_PROVIDER_SITE_OTHER): Payer: Medicare Other | Admitting: Pulmonary Disease

## 2010-10-09 ENCOUNTER — Ambulatory Visit (INDEPENDENT_AMBULATORY_CARE_PROVIDER_SITE_OTHER)
Admission: RE | Admit: 2010-10-09 | Discharge: 2010-10-09 | Disposition: A | Discharge status: Home / Self Care | Payer: Medicare Other | Source: Ambulatory Visit | Attending: Pulmonary Disease | Admitting: Pulmonary Disease

## 2010-10-09 ENCOUNTER — Other Ambulatory Visit: Payer: Self-pay | Admitting: *Deleted

## 2010-10-09 ENCOUNTER — Other Ambulatory Visit (INDEPENDENT_AMBULATORY_CARE_PROVIDER_SITE_OTHER): Payer: Medicare Other

## 2010-10-09 ENCOUNTER — Other Ambulatory Visit (INDEPENDENT_AMBULATORY_CARE_PROVIDER_SITE_OTHER): Payer: Medicare Other | Admitting: Pulmonary Disease

## 2010-10-09 DIAGNOSIS — M899 Disorder of bone, unspecified: Secondary | ICD-10-CM

## 2010-10-09 DIAGNOSIS — F411 Generalized anxiety disorder: Secondary | ICD-10-CM

## 2010-10-09 DIAGNOSIS — Z0000 Encounter for general adult medical examination without abnormal findings: Secondary | ICD-10-CM

## 2010-10-09 DIAGNOSIS — N3000 Acute cystitis without hematuria: Secondary | ICD-10-CM

## 2010-10-09 DIAGNOSIS — J8410 Pulmonary fibrosis, unspecified: Secondary | ICD-10-CM

## 2010-10-09 DIAGNOSIS — I059 Rheumatic mitral valve disease, unspecified: Secondary | ICD-10-CM

## 2010-10-09 DIAGNOSIS — M949 Disorder of cartilage, unspecified: Secondary | ICD-10-CM

## 2010-10-09 DIAGNOSIS — K449 Diaphragmatic hernia without obstruction or gangrene: Secondary | ICD-10-CM

## 2010-10-09 DIAGNOSIS — E7800 Pure hypercholesterolemia, unspecified: Secondary | ICD-10-CM

## 2010-10-09 DIAGNOSIS — K219 Gastro-esophageal reflux disease without esophagitis: Secondary | ICD-10-CM

## 2010-10-09 DIAGNOSIS — M1990 Unspecified osteoarthritis, unspecified site: Secondary | ICD-10-CM

## 2010-10-09 LAB — LIPID PANEL
HDL: 74.3 mg/dL (ref >39.00)
LDL Cholesterol: 82 mg/dL (ref 0–99)
VLDL: 25.4 mg/dL (ref 0.0–40.0)

## 2010-10-09 LAB — CBC WITH DIFFERENTIAL/PLATELET
Eosinophils Relative: 2.4 % (ref 0.0–5.0)
HCT: 39.6 % (ref 36.0–46.0)
Hemoglobin: 13.4 g/dL (ref 12.0–15.0)
Lymphs Abs: 1.4 10*3/uL (ref 0.7–4.0)
MCV: 95.9 fl (ref 78.0–100.0)
Monocytes Absolute: 0.4 10*3/uL (ref 0.1–1.0)
Monocytes Relative: 6.5 % (ref 3.0–12.0)
Neutro Abs: 4.6 10*3/uL (ref 1.4–7.7)
Platelets: 234 10*3/uL (ref 150.0–400.0)
RDW: 13.6 % (ref 11.5–14.6)
WBC: 6.6 10*3/uL (ref 4.5–10.5)

## 2010-10-09 LAB — TSH: TSH: 2.4 u[IU]/mL (ref 0.35–5.50)

## 2010-10-09 LAB — URINALYSIS, ROUTINE W REFLEX MICROSCOPIC
Bilirubin Urine: NEGATIVE
Ketones, ur: NEGATIVE
Specific Gravity, Urine: 1.01 (ref 1.000–1.030)
Urine Glucose: NEGATIVE
pH: 7.5 (ref 5.0–8.0)

## 2010-10-09 LAB — HEPATIC FUNCTION PANEL
AST: 21 U/L (ref 0–37)
Albumin: 4.7 g/dL (ref 3.5–5.2)
Alkaline Phosphatase: 64 U/L (ref 39–117)
Total Bilirubin: 0.8 mg/dL (ref 0.3–1.2)

## 2010-10-09 LAB — BASIC METABOLIC PANEL
Calcium: 9.3 mg/dL (ref 8.4–10.5)
GFR: 74.43 mL/min (ref >60.00)
Potassium: 4.6 mEq/L (ref 3.5–5.1)
Sodium: 143 mEq/L (ref 135–145)

## 2010-10-09 MED ORDER — ALENDRONATE SODIUM 70 MG PO TABS: 70.0000 mg | ORAL_TABLET | ORAL | Status: DC

## 2010-10-09 MED ORDER — CIPROFLOXACIN HCL 250 MG PO TABS: 250.0000 mg | ORAL_TABLET | Freq: Two times a day (BID) | ORAL | Status: AC

## 2010-10-09 MED ORDER — ALBUTEROL SULFATE HFA 108 (90 BASE) MCG/ACT IN AERS: 2.0000 | INHALATION_SPRAY | Freq: Four times a day (QID) | RESPIRATORY_TRACT | Status: DC | PRN

## 2010-10-09 MED ORDER — ATORVASTATIN CALCIUM 20 MG PO TABS: 20.0000 mg | ORAL_TABLET | Freq: Every day | ORAL | Status: DC

## 2010-10-09 NOTE — Patient Instructions (Signed)
Today we updated your med list in EPIC...    We refilled the meds you requested...  Today we did your follow up CXR & fasting blood work...    Please call the PHONE TREE in a few days for your results...    Dial [id number] & when prompted enter your patient number followed by the # symbol...    Your patient number is:  [id number]  Call for any questions...  Let's plan another follow up visit in about 6 months, sooner if needed for problems.Marland KitchenMarland Kitchen

## 2010-10-09 NOTE — Progress Notes (Signed)
Subjective:    Patient ID: Hannah Mercado, female    DOB: 14-Apr-1922, 77 y.o.   MRN: [id number]  HPI 77 y/o WF here for a follow up visit... she has a hx of AB & is a never-smoker, uses PROAIR Prn... she has hyperlipidemia on LIPITOR, and a hx of GERD controlled on PRILOSEC... she has repeatedly refused routine colonoscopy despite efforts to get her to do so...  ~  May10:  she's had a good 69mo- still plays golf regularly... c/o neuropathy discomfort in LE's and friend w/ similar symptoms takes Gabapentin ==> tried it, no benefit. ~  Nov10:  she had a good 6 mo- went to [place] w/ her daughter... no new complaints or concerns... she has lost 15# on her diet down to 148# (eating less, appetite is pretty good, etc)... OK flu shot today. ~  [id number]:  another good 35mo- no new complaints or concerns, in good spirits & feeling well... notes it's too hot to golf, but exerc w/ walking etc... GYN= DrHenley & BMD is due... also due for TDAP today.  ~  April 01, 2010:  she had BMD in our office 7/11> osteopenia w/ TScores -2.3 in Lumbar Spine & -2.3 in right FemNeck> on Fosamax, Calcium, MVI, Vit D 1000 u daily...  she once again notes the Neuropathy symptoms in her legs- esp at night & wants to try the Gabapentin again, therefore we will start w/ 100mg  Qhs & titrate up to 300mg .  ~  October 09, 2010:  86mo ROV & she reports a good interval w/o new complaints or concerns> she does not like the hot, humid weather but is generally stable, requesting refill prescriptions, and due for yearly CXR & fasting blood work...   Problem List:   pt's cell # [id number]  ALLERGY (ICD-995.3) - uses OTC meds as needed.  ASTHMATIC BRONCHITIS, ACUTE (ICD-466.0) - uses PROAIR as needed, but doing well, breathing OK. ~  7/09:  AB exac Rx w/ Pred, Avelox, Mucinex, etc... symptoms resolved. ~  CXR: baseline film w/ mild cardiomeg, scarring, chr changes, HH seen, & NAD.Marland Kitchen. ~  f/u CXR 6/11 & 7/12>  no change...  PULMONARY  FIBROSIS, POSTINFLAMMATORY (ICD-515) - she has a large HH seen on CXR and CT scan, + hx of pneumonia... exam showed bibasilar velcro rales about 1/3rd way up> no change.  MITRAL VALVE PROLAPSE (ICD-424.0) - on ASA 81mg /d... had 2DEcho 2/04 showing MVP, mild MR, normal LVF... StressEcho w/ EF=60%... subseq Cardiolite 12/03 was normal w/o ischemia and EF=65%... denies CP, palpit, dizzy, syncope, edema, etc... she is active- still golfs etc.  HYPERCHOLESTEROLEMIA (ICD-272.0) - controlled on LIPITOR 20mg /d,  and tol well...  ~  labs 7/08 showed TChol 183, TG 111, HDL 65, LDL 96... Doing well, continue Lip20. ~  FLP 11/09 showed TChol 189, TG 70, HDL 71, LDL 104 ~  FLP 5/10 showed TChol 173, TG 53, HDL 75, LDL 87... Remains stable on Lip20. ~  FLP 6/11 showed TChol 183, TG 103, HDL 74, LDL 88 ~  FLP 7/12 showed TChol 182, TG 127, HDL 74, LDL 82... Continues stable on Lip20.  HIATAL HERNIA (ICD-553.3) & GERD (ICD-530.81) - she has a HH seen on CXR and CT Chest... takes OMEPRAZOLE 20mg /d and denies heartburn, GERD, regurg, dysphagia, etc... pt never had EGD or Colonoscopy... she has repeatedly refused to sched a colonoscopy! ~  Q19mo ROVs>  she continues to deny abd pain, N/V, etc> & notes norm BM's, etc...  RECURRENT  UTIs>  Routine urinalysis w/ WBC, Bact, Leukocyte+, etc... Treated w/ Cipro as required.  DEGENERATIVE JOINT DISEASE (ICD-715.90) - mild in fingers, no other complaints x "I can't make quick turns anymore"... she notes the Tonic Water Qhs works great for her nightime cramps, & on NEURONTIN 100mg - 2Qhs for ?neuropathy symptoms?  OSTEOPENIA (ICD-733.90) - BMDs here in 2003, 2005, 2007- showed osteopenia/ osteoporosis w/ TScores -1.9 to -2.5.Marland KitchenMarland Kitchen on FOSAMAX, Ca++, Vits... ~  labs 11/09 showed Vit D level = 28... rec to take OTC supplement 1000 u daily. ~  BMD here 7/11 showed TScores -2.3 in Lumbar Spine, and -2.3 in right FemNeck... continue Rx.  ANXIETY (ICD-300.00)  Health Maintenance -  GYN= DrHenley & follow up [place] w/ neg exam she says and stool cards were neg by her report... last Mammogram 6/10 at [facility] was neg... she refuses to consider a colonoscopy... OK 2010 flu vaccine 11/10... had PNEUMOVAX 2009... OK TDAP 6/11...   Past Surgical History  Procedure Date  . Abdominal hysterectomy 1980's  . Nasal sinus surgery 1994  . Hammer toe surgery 1995    Dr. Gladstone Lighter    Outpatient Encounter Prescriptions as of 10/09/2010  Medication Sig Dispense Refill  . albuterol (PROAIR HFA) 108 (90 BASE) MCG/ACT inhaler Inhale 2 puffs into the lungs every 6 (six) hours as needed.  1 Inhaler  11  . aspirin 81 MG tablet Take 81 mg by mouth daily.        Marland Kitchen atorvastatin (LIPITOR) 20 MG tablet Take 1 tablet (20 mg total) by mouth daily.  30 tablet  11  . Calcium Carbonate-Vitamin D (CALCIUM-VITAMIN D) 500-200 MG-UNIT per tablet Take 1 tablet by mouth 2 (two) times daily with a meal.        . Cholecalciferol (VITAMIN D3) 2000 UNITS TABS Take 1 tablet by mouth daily.        Marland Kitchen gabapentin (NEURONTIN) 100 MG tablet Take 100 mg by mouth 2 (two) times daily.        Marland Kitchen omeprazole (PRILOSEC) 20 MG capsule Take 20 mg by mouth daily.        . tacrolimus (PROTOPIC) 0.1 % ointment Apply as directed       . vitamin B-12 (CYANOCOBALAMIN) 250 MCG tablet Take 250 mcg by mouth daily.        . vitamin C (ASCORBIC ACID) 500 MG tablet Take 500 mg by mouth daily.        . vitamin E 1000 UNIT capsule Take 1,000 Units by mouth daily.        Marland Kitchen alendronate (FOSAMAX) 70 MG tablet Take 1 tablet (70 mg total) by mouth every 7 (seven) days. Take with a full glass of water on an empty stomach.  4 tablet  11    Allergies  Allergen Reactions  . Sulfonamide Derivatives     Current Medications, Allergies, Past Medical History, Past Surgical History, Family History, and Social History were reviewed in [employer] record.   Review of Systems         See HPI - all other systems neg except as  noted...  The patient denies anorexia, fever, weight loss, weight gain, vision loss, decreased hearing, hoarseness, chest pain, syncope, dyspnea on exertion, peripheral edema, prolonged cough, headaches, hemoptysis, abdominal pain, melena, hematochezia, severe indigestion/heartburn, hematuria, incontinence, muscle weakness, suspicious skin lesions, transient blindness, difficulty walking, depression, unusual weight change, abnormal bleeding, enlarged lymph nodes, and angioedema.   Objective:   Physical Exam     WD,  WN, 77 y/o WF in NAD... GENERAL:  Alert & oriented; pleasant & cooperative... HEENT:  [place]/AT, Glasses, EOM-wnl, PERRLA, EACs-clear, TMs-wnl, NOSE-clear, THROAT-clear & wnl. NECK:  Supple w/ fair ROM; no JVD; normal carotid impulses w/o bruits; no thyromegaly or nodules palpated; no lymphadenopathy. CHEST:  few bibasilar dry rales about 1/3rd way up, no wheezing, no rhonchi, no cough, etc... HEART:  Regular Rhythm; without murmurs/ rubs/ or gallops, no msc heard... ABDOMEN:  Soft & nontender; normal bowel sounds; no organomegaly or masses detected. EXT:  no deformities, mild arthritic changes; no varicose veins/ +venous insuffic/ no edema. NEURO:  CN's intact; no focal neuro deficits... DERM:  No lesions noted; no rash etc...   Assessment & Plan:   PULM>  AB, Pulm Fibrosis>  She doesn't like the hot, humid weather, but overall stable on prn Proair; CXR & resp exam w/o changes, stable.  MVP>  She is essent asymptomatic w/o CP, palpit, SOB, edema, etc...  CHOL>  Stable on Lip20 + diet, continue same...  GI> HH, GERD, refusal of colonoscopy>  Stable on Prilosec, she requests stool cards to check for hidden blood...  DJD/ Osteopenia>  She remains on Fosamax, Calcium, MVI, Vit D, etc; she will be due for BMD next yr...  UTI>  Treated w/ Cipro empirically...  Anxiety>  Stable & she does not require anxiolytic Rx.Marland KitchenMarland Kitchen

## 2010-10-09 NOTE — Telephone Encounter (Signed)
Called and spoke with pt and she is aware of lab results per SN that shows uti.  cipro has been sent to the pharmacy. And pt is aware

## 2010-11-05 ENCOUNTER — Other Ambulatory Visit: Payer: Medicare Other

## 2010-11-10 ENCOUNTER — Other Ambulatory Visit: Payer: Self-pay | Admitting: Pulmonary Disease

## 2010-11-10 DIAGNOSIS — Z1289 Encounter for screening for malignant neoplasm of other sites: Secondary | ICD-10-CM

## 2010-11-10 LAB — HEMOCCULT SLIDES (X 3 CARDS)
OCCULT 1: POSITIVE
OCCULT 2: POSITIVE
OCCULT 3: NEGATIVE

## 2010-11-12 ENCOUNTER — Other Ambulatory Visit: Payer: Self-pay | Admitting: Pulmonary Disease

## 2010-11-12 DIAGNOSIS — R195 Other fecal abnormalities: Secondary | ICD-10-CM

## 2010-11-12 DIAGNOSIS — K219 Gastro-esophageal reflux disease without esophagitis: Secondary | ICD-10-CM

## 2010-11-13 ENCOUNTER — Telehealth: Payer: Self-pay | Admitting: Gastroenterology

## 2010-11-13 NOTE — Telephone Encounter (Signed)
Left message for patient to call back  

## 2010-11-13 NOTE — Telephone Encounter (Signed)
Patient is rescheduled to see Dr Carlean Purl on 8/28.  Chandler notified

## 2010-11-17 ENCOUNTER — Encounter: Payer: Self-pay | Admitting: Internal Medicine

## 2010-11-17 ENCOUNTER — Ambulatory Visit (INDEPENDENT_AMBULATORY_CARE_PROVIDER_SITE_OTHER): Payer: Medicare Other | Admitting: Internal Medicine

## 2010-11-17 VITALS — BP 166/76 | HR 60 | Ht 67.0 in | Wt 147.2 lb

## 2010-11-17 DIAGNOSIS — R195 Other fecal abnormalities: Secondary | ICD-10-CM

## 2010-11-17 MED ORDER — PEG-KCL-NACL-NASULF-NA ASC-C 100 G PO SOLR: 1.0000 | Freq: Once | ORAL | Status: DC

## 2010-11-17 NOTE — Patient Instructions (Signed)
You have been scheduled for a colonoscopy. Please follow the written instructions given to you today. Please pick up your Moviprep at the pharmacy.

## 2010-11-17 NOTE — Progress Notes (Signed)
  Subjective:    Patient ID: Hannah Mercado, female    DOB: [birth year], 77 y.o.   MRN: [id number]  HPI an elderly white woman who has declined screening colonoscopy for years. She did do Hemoccult cards recently and one of 3 was positive. Over the past 6 months she has also noted some bright red blood streaked on the stool at times but not on the toilet paper, associated with hard stools. There is no other significant change in bowel habit or other GI issues.    Review of Systems Remains active, planning on playing in a golf tournaments in. Does wear eyeglasses. Has some respiratory difficulty at times but overall doing well.    Objective:   Physical Exam General: Well-developed, well-nourished and in no acute distress Vitals: Reviewed and listed above Eyes:anicteric. Mouth and posterior pharynx: normal.  Neck: supple w/o thyromegaly or mass.  Lungs: clear. Heart: S1S2, no rubs, murmurs, gallops. Abdomen: soft, non-tender, no hepatosplenomegaly, hernia, or mass and BS+.  Rectal: Deferred Extremities:  no edema Neuro:  A&O x 3.  Psych: appropriate mood and  affect.        Assessment & Plan:

## 2010-11-17 NOTE — Assessment & Plan Note (Addendum)
Schedule colonoscopy to evaluate for cause - risks benefits and indications reviewed and she agrees to proceed.

## 2010-11-27 ENCOUNTER — Ambulatory Visit (AMBULATORY_SURGERY_CENTER): Payer: Medicare Other | Admitting: Internal Medicine

## 2010-11-27 ENCOUNTER — Telehealth: Payer: Self-pay

## 2010-11-27 ENCOUNTER — Other Ambulatory Visit: Payer: Medicare Other

## 2010-11-27 ENCOUNTER — Encounter: Payer: Self-pay | Admitting: Internal Medicine

## 2010-11-27 ENCOUNTER — Other Ambulatory Visit: Payer: Self-pay | Admitting: Internal Medicine

## 2010-11-27 DIAGNOSIS — K5730 Diverticulosis of large intestine without perforation or abscess without bleeding: Secondary | ICD-10-CM

## 2010-11-27 DIAGNOSIS — C20 Malignant neoplasm of rectum: Secondary | ICD-10-CM

## 2010-11-27 DIAGNOSIS — D126 Benign neoplasm of colon, unspecified: Secondary | ICD-10-CM

## 2010-11-27 DIAGNOSIS — R195 Other fecal abnormalities: Secondary | ICD-10-CM

## 2010-11-27 HISTORY — PX: COLONOSCOPY: SHX174

## 2010-11-27 MED ORDER — SODIUM CHLORIDE 0.9 % IV SOLN: 500.0000 mL | INTRAVENOUS | Status: DC

## 2010-11-27 NOTE — Telephone Encounter (Signed)
Patient is scheduled for CT scan on 11/30/10 3:00 at [facility].  I have contacted [facility] lab and they are going to add a BUN and creatinine to today's labs.  The patient verbalized understanding of instructions.

## 2010-11-27 NOTE — Patient Instructions (Addendum)
There is a mass in the rectum that looks like a cancer. Hopefully, it will be removable with surgery but you need to have a CT scan before we know, and possibly may need a test called endoscopic ultrasound. We will call you about the CT scan appointment. You will go to the lab for a blood test called CEA today. When I have the pathology results back (next week), we will call with the results. We will get you the help you need with this. A small benign polyp was also removed and there is diverticulosis. Gatha Mayer, MD, [facility]  Handouts given on diverticulosis, high fiber diet, and polyps. Discharge instructions given per blue and green sheets Your oral contrast will be sent home with you today. However, Dr Marla Roe office will be calling you with the directions and instructions for use. Today you will go to the lab for blood work Future plans after ct scan done per Dr Carlean Purl

## 2010-11-27 NOTE — Assessment & Plan Note (Signed)
CEA, CT abd/pelvis Await that and determine if needs EUS and GSU eval (likely unless mets)

## 2010-11-30 ENCOUNTER — Telehealth: Payer: Self-pay | Admitting: *Deleted

## 2010-11-30 ENCOUNTER — Ambulatory Visit (INDEPENDENT_AMBULATORY_CARE_PROVIDER_SITE_OTHER)
Admission: RE | Admit: 2010-11-30 | Discharge: 2010-11-30 | Disposition: A | Discharge status: Home / Self Care | Payer: Medicare Other | Source: Ambulatory Visit | Attending: Internal Medicine | Admitting: Internal Medicine

## 2010-11-30 ENCOUNTER — Encounter: Payer: Self-pay | Admitting: Internal Medicine

## 2010-11-30 DIAGNOSIS — C20 Malignant neoplasm of rectum: Secondary | ICD-10-CM

## 2010-11-30 MED ORDER — IOHEXOL 300 MG/ML  SOLN
100.0000 mL | Freq: Once | INTRAMUSCULAR | Status: AC | PRN
  Administered 2010-11-30: 100 mL via INTRAVENOUS

## 2010-11-30 NOTE — Progress Notes (Signed)
Quick Note:  Let her know that as best we can tell the CT does not show any distant spread of the rectal cancer - pathology did confirm this  She does have a cystic lesion in the pelvis that needs evaluation - seems very unlikely to be related to the rectal cancer  1) Pelvic US with vaginal and trans abd images - evaluate left pelvic cyst use radiologic abnormality GU /pelvic organs as dx 2) arrange endoscopic [place] by Dr. Ardis Hughs - rectal cancer, am copying him 3) appt sugery re: rectal cancer - Dr. Barry Dienes (unless she has history or other preference)  Let me know what ?'s she has if any and I can call her later in week ______

## 2010-11-30 NOTE — Progress Notes (Signed)
Quick Note:  Confirms what she was told and will be retold with CT results. No letter and no need for recall colonoscpy ______

## 2010-11-30 NOTE — Telephone Encounter (Signed)

## 2010-12-01 ENCOUNTER — Telehealth: Payer: Self-pay

## 2010-12-01 ENCOUNTER — Other Ambulatory Visit: Payer: Self-pay | Admitting: Gastroenterology

## 2010-12-01 DIAGNOSIS — C20 Malignant neoplasm of rectum: Secondary | ICD-10-CM

## 2010-12-01 DIAGNOSIS — R9389 Abnormal findings on diagnostic imaging of other specified body structures: Secondary | ICD-10-CM

## 2010-12-01 NOTE — Telephone Encounter (Signed)
Patient is scheduled for Dr Barry Dienes for 12/25/10 arrive 11:00 for 11:30 She is scheduled for an [place] at [facility] 12/04/10 at 1:00.  She does not need to be NPO but does need to have a full bladder  Left message for patient to call back

## 2010-12-01 NOTE — Telephone Encounter (Signed)
Patient advised of the two appts.  She is aware that she will be contacted by Dr Ardis Hughs CMA [place] to set up the EUS.

## 2010-12-01 NOTE — Telephone Encounter (Signed)
Message copied by Marlon Pel on Tue Dec 01, 2010 11:15 AM ------      Message from: Silvano Rusk E      Created: Mon Nov 30, 2010  9:59 PM       Let her know that as best we can tell the CT does not show any distant spread of the rectal cancer - pathology did confirm this            She does have a cystic lesion in the pelvis that needs evaluation - seems very unlikely to be related to the rectal cancer            1) Pelvic US with vaginal and trans abd images - evaluate left pelvic cyst use radiologic abnormality GU /pelvic organs as dx      2) arrange endoscopic [place] by Dr. Ardis Hughs - rectal cancer, am copying him      3) appt sugery re: rectal cancer - Dr. Barry Dienes (unless she has history or other preference)            Let me know what ?'s she has if any and I can call her later in week

## 2010-12-03 ENCOUNTER — Ambulatory Visit (HOSPITAL_COMMUNITY)
Admission: RE | Admit: 2010-12-03 | Discharge: 2010-12-03 | Disposition: A | Discharge status: Home / Self Care | Payer: Medicare Other | Source: Ambulatory Visit | Attending: Gastroenterology | Admitting: Gastroenterology

## 2010-12-03 ENCOUNTER — Encounter: Payer: Medicare Other | Admitting: Gastroenterology

## 2010-12-03 DIAGNOSIS — C20 Malignant neoplasm of rectum: Secondary | ICD-10-CM | POA: Insufficient documentation

## 2010-12-03 DIAGNOSIS — C210 Malignant neoplasm of anus, unspecified: Secondary | ICD-10-CM

## 2010-12-04 ENCOUNTER — Ambulatory Visit (HOSPITAL_COMMUNITY)
Admission: RE | Admit: 2010-12-04 | Discharge: 2010-12-04 | Disposition: A | Discharge status: Home / Self Care | Payer: Medicare Other | Source: Ambulatory Visit | Attending: Internal Medicine | Admitting: Internal Medicine

## 2010-12-04 ENCOUNTER — Encounter: Payer: Self-pay | Admitting: *Deleted

## 2010-12-04 ENCOUNTER — Telehealth: Payer: Self-pay | Admitting: *Deleted

## 2010-12-04 DIAGNOSIS — N838 Other noninflammatory disorders of ovary, fallopian tube and broad ligament: Secondary | ICD-10-CM | POA: Insufficient documentation

## 2010-12-04 DIAGNOSIS — R9389 Abnormal findings on diagnostic imaging of other specified body structures: Secondary | ICD-10-CM

## 2010-12-04 DIAGNOSIS — C20 Malignant neoplasm of rectum: Secondary | ICD-10-CM

## 2010-12-04 NOTE — Telephone Encounter (Signed)
error 

## 2010-12-04 NOTE — Telephone Encounter (Signed)
Patient Mrs. Hannah Mercado called from [facility] radiology because she was there for her 1 pm appointment but per radiology patient was scheduled for 1 pm appointment at [facility].   Notes stated per Barbera Setters that she scheduled patient at [facility] for [place] at 1 pm today.  Patient was very upset when I talked to her. I told patient that I do apologize for the mix up and I can call [facility] and explain the mix up and that she may be late for her 1 pm appointment. Patient stated no she didn't want me to call because she will have [facility] radiology call. Patient said she just called because she wanted me to note the mix up with the appointment in her chart.

## 2010-12-07 ENCOUNTER — Ambulatory Visit: Payer: Medicare Other | Admitting: Gastroenterology

## 2010-12-07 NOTE — Progress Notes (Signed)
Quick Note:  Let her know this is inconclusive - to be discussed at GI oncology conference Wed and we can decide other testing after that ______

## 2010-12-09 NOTE — Progress Notes (Signed)
Quick Note:  Let her know probably does not need further testing based upon opinions at cancer conference Next step is to see Dr. Barry Dienes as planned ______

## 2010-12-11 ENCOUNTER — Telehealth: Payer: Self-pay | Admitting: Internal Medicine

## 2010-12-11 NOTE — Telephone Encounter (Signed)
All questions answered.  She will keep her appt with Dr Barry Dienes in October

## 2010-12-29 ENCOUNTER — Ambulatory Visit (INDEPENDENT_AMBULATORY_CARE_PROVIDER_SITE_OTHER): Payer: Medicare Other | Admitting: General Surgery

## 2010-12-29 ENCOUNTER — Encounter (INDEPENDENT_AMBULATORY_CARE_PROVIDER_SITE_OTHER): Payer: Self-pay | Admitting: General Surgery

## 2010-12-29 DIAGNOSIS — N83202 Unspecified ovarian cyst, left side: Secondary | ICD-10-CM | POA: Insufficient documentation

## 2010-12-29 DIAGNOSIS — C20 Malignant neoplasm of rectum: Secondary | ICD-10-CM

## 2010-12-29 DIAGNOSIS — R1900 Intra-abdominal and pelvic swelling, mass and lump, unspecified site: Secondary | ICD-10-CM

## 2010-12-29 NOTE — Assessment & Plan Note (Signed)
Pt will likely need opinion of OB/GYN if Dr. Carlean Purl did not already arrange.

## 2010-12-29 NOTE — Patient Instructions (Addendum)
1.  Laparoscopic low anterior resection (abdominal operation to remove entire portion of colon/rectum with mass)  Advantages Lower risk of cancer coming back.    Disadvantages Higher risk of complications  Risk of ostomy (stool through skin)   Risk of leak of connection and getting very sick in ICU  Risk of other infections in the skin, abscess in abdomen, pneumonia, urinary infection  Higher risk of bleeding.  Risk of hernia at incision Longer recovery time (at least 6 months before energy level back to normal)   2.  Transanal excision of cancer (operation through anus to remove a disc of rectal wall and sewing that back together) Advantages Shorter recovery time Only need ostomy if leak occurs. Lower risk of complications altogether.  Disadvantages. Higher risk of cancer coming back.  I will likely be out of the operating room tomorrow between 11:30 am and 1 pm.  Please call the office between then and they will page me. [phone])      Instructions if you decide on colectomy.   Cancer of the Colon, Surgical Treatment Based on the location and type of colon cancer that you have, we have determined that surgical removal of this part of your colon will be part of your treatment.  This will treat symptoms of bleeding or blockage that you may be experiencing.  I will do everything that I can to remove your entire tumor safely. To do this, some normal tissue must also be removed to give you the best chance for a cure. The following will help describe what happens when you have this surgery.  TREATMENT Surgery is the most common treatment for colorectal cancer. It is a type of local therapy. It treats the cancer in the colon or rectum and the area close to the tumor by removing the tumor and some of the healthy tissue around it. This tissue includes an apron of tissue with lymph nodes and blood vessels.  We usually use laparoscopy (smaller incisions) to help minimize the size of  the larger incision on your abdomen.  However, even if we use laparoscopy primarily, we still need to make an incision large enough to bring the two ends of the colon out to reconnect them.  For tumors that are very large or very adherent to the adjacent structures, we may have to make a larger incision to do your operation safely. The surgeon checks the rest of the abdomen, the intestine and the liver to see if the cancer has spread.  There may be microscopic disease present that we are unable to detect.  We may do a liver ultrasound during surgery as part of this evaluation.    When a section of the colon or rectum is removed, we can usually reconnect the healthy parts. However, sometimes reconnection is not possible. In this case, we will make a new path for your bowel movements to leave the body. This is an opening (a stoma) in the wall of the abdomen. The upper end of the intestine is then connected to the stoma. The other end is closed. The operation to create the stoma is called a colostomy. A flat bag fits over the stoma to collect waste, and a special adhesive holds it in place.  Most patients are able to do their normal activities with a colostomy, including travelling, exercising, and swimming.    Some colostomies are temporary. The colostomy is needed only until the colon or rectum heals from surgery. Some patients will need to get  additional treatment such as chemotherapy before the ostomy can be taken down.  After this occurs, we can reconnect the parts of the intestine and closes the stoma.  Some  patients need a permanent colostomy, and some patients are too frail to undergo another surgery to take the ostomy down.    The part of colon that we plan to remove in your case is the rectum.  Whatever the operative findings, I will discuss the case with your family after we are done in the operating room.  I will talk to you in the next few days when you are more awake.  I will see you in the  hospital every weekday that I am not out of town.  My partners help see patients on the weekends and if I am out of town.    IF YOU ARE TAKING ASPIRIN, PLAVIX, COUMADIN, OR OTHER BLOOD THINNERS, LET us KNOW IMMEDIATELY SO WE CAN CONTACT YOUR PRESCRIBING HEALTH CARE PROVIDER TO HOLD THE MEDICATION FOR 5-7 DAYS BEFORE SURGERY    WHAT HAPPENS AFTER SURGERY: After surgery, you will go first to the recovery room, then to your room.   You will have many tubes and lines in place which is standard for this operation.  You will have several IVs, including possibly a central line in your chest or neck.  YOU WILL NOT BE ABLE TO EAT FOR SEVERAL DAYS AFTER SURGERY.  You will have a catheter in your bladder for around 1-3 days.  On your abdomen, you will have and possibly a pain pump with numbing medicine.  You will have compression stockings on your legs to decrease the risk of blood clots.    We will address your pain in several ways.  We will use an IV pain pump called a "PCA," or Patient Controlled Analgesia.  This allows you to press a button and immediately receive a dose of pain medication without waiting for a nurse.  We also use IV Tylenol and sometimes IV Toradol which is similar to ibuprofen.  You may also have a pump with numbing medicine delivered directly to your incision.  I use doses and medications that work for the majority of people, but you may need an adjustment to the dose or type of medicine if your pain is not adequately controlled.  Your throat may be sore, in which case you may need a throat spray or lozenges.  Some patients become disoriented with the pain medication and may need adjustments due to that.    We will ask you to get out of bed the day after surgery in order to maximize your chances of not having complications.  Your risk of pneumonia and blood clots is lower with walking and sitting in the chair.  We will also ask you to perform breathing exercises.  We will also ask to you walk  in your room and in the halls for the above reasons, but also in order for you to keep up your strength.    EATING: We will usually start you on clear liquids in around 1-2 days if your bowel function seems to be returning.  We advance your diet slowly to make sure you are tolerating each step.  All patients do not have a normal appetite when they go home.  Many patients also find that their taste buds do not seem the same right after surgery, and this can continue into the time of possible post operative chemotherapy and radiation.  We may need  to use different doses or type of medicine than you use at home to control high blood pressure, diabetes, or other medical problems.   SLEEPING: We do not give sleeping medications in the hospital routinely.  Many patients have difficulty sleeping due to the unfamiliar environment or the medical care that occurs at night.  Sleeping medications can interfere with the pain medications and cause significant mental status changes that are dangerous.    OTHER TREATMENT? I will present your case when the pathology is available at our GI Multidisciplinary conference which occurs every 1-2 weeks.  Medical and Radiation Oncologists are present, as well as the pathologist and radiologist in addition to myself.  If you have a larger tumor or if you have positive lymph nodes, we may have the oncologist stop by to see you while you are in the hospital.  Most firm post op treatment plans occur, however, once you are an outpatient because we will need to see how you are recovering from surgery.  GOING HOME! Usually you are able to go home in 4-8 days, depending on whether or not complications happen and what is going on with your overall health status.   If you have more health problems or if you have limited help at home, the therapists and nurses may recommend a temporary rehab or nursing facility to help you get back on your feet before you go home.  These decisions would  be made while you are in the hospital with the assistance of a social worker or case manager.  You cannot do any heavy lifting for 6-8 weeks due to the risk of hernia.    Please bring all insurance/disability forms to our office for the staff to fill out   POSSIBLE COMPLICATIONS This is a major operation and includes complications listed below: Bleeding Infection and possible wound complications such as hernia Damage to adjacent structures Leak of surgical connections, which can lead to other surgeries and possibly an ostomy. (5-10%) Possible need for other procedures, such as abscess drains in radiology  Possible prolonged hospital stay Possible diarrhea from removal of part of the colon. Possible constipation from narcotics.    Prolonged fatigue/weakness/appetite MOST PATIENTS' ENERGY LEVEL IS NOT BACK TO NORMAL FOR AT LEAST 2-4 MONTHS.  OLDER PATIENTS MAY FEEL WEAK FOR LONGER PERIODS OF TIME.   Difficulty with eating or post operative nausea  Possible early recurrence of cancer Possible complications of your medical problems such as heart disease or arrhythmias. Death (less than 1%)  All possible complications are not listed, just the most common.    FURTHER INFORMATION? Please ask questions if you find something that we did not discuss in the office and would like more information.  If you would like another appointment if you have many questions or if your family members would like to come as well, please contact the office.    FOLLOW-UP CARE  Follow-up care after treatment for colorectal cancer is important. Even when the cancer seems to have been completely removed or destroyed, the disease sometimes returns. Undetected cancer cells may still remain somewhere in the body after treatment. We check your recovery and for recurrence of the cancer. Recurrence means that the cancer comes back.  Checkups help make sure that changes in health are found. Checkups may include:  A physical  exam (possibly including a digital rectal exam). This means your caregiver checks you to see if there are any abnormal changes they can see or feel.   Lab  tests (CEA test) may be done. The "fecal occult blood test" checks for blood in the stool. The CEA (carcinoembryonic antigen) is a blood test that looks for a marker of colon cancer in the blood.   A colonoscopy is a test where your caregiver examines your colon with a flexible instrument like a thin telescope which looks at the inside of the large bowel. All patients with colon cancer are recommended to get a colonoscopy 1 year after the surgery  Other specialized x-rays, CT scans, or other tests may be performed.    Between scheduled visits you should contact your caregivers as soon as any health problems appear.

## 2010-12-29 NOTE — Progress Notes (Signed)
Chief Complaint  Patient presents with  . Other    rectal lesion    HISTORY: Pt is 77 year old female that presents with screening detected blood per rectum on hemoccult cards.  Colonoscopy revealed rectal mass at 7 cm on the right lateral wall.  Biopsies positive for cancer.  She has only seen blood in stool once.  She has not had symptoms of anemia.  She denies decreased stool caliber.  She has had slightly worse constipation recently, and she has had to increase laxative intake.  She has been very healthy her entire life.  She plays golf several times/week and does all her own housework.  She has undergone EUS by Dr. Ardis Hughs and this mass is a T2.  She also has had a CT scan which is negative for metastatic disease.  She does, however, have a pelvic mass suspicious for a ovarian mass.  Ultrasound was not conclusive.    Past Medical History  Diagnosis Date  . Allergy history unknown   . Asthmatic bronchitis   . History of pneumonia     right middle lobe  . Pulmonary fibrosis, postinflammatory   . Mitral valve prolapse   . Hypercholesteremia   . Hiatal hernia   . GERD (gastroesophageal reflux disease)   . DJD (degenerative joint disease)   . Osteopenia   . Anxiety     Past Surgical History  Procedure Date  . Nasal sinus surgery 1994  . Hammer toe surgery 1995    Dr. Silverio Decamp  . Appendectomy   . Colonoscopy 11/27/10    rectal cancer, small sigmoid adenoma, diverticulosis  . Vaginal hysterectomy 1980's  . Ectopic pregnancy surgery     Midline incision    Current Outpatient Prescriptions  Medication Sig Dispense Refill  . albuterol (PROAIR HFA) 108 (90 BASE) MCG/ACT inhaler Inhale 2 puffs into the lungs every 6 (six) hours as needed.  1 Inhaler  11  . alendronate (FOSAMAX) 70 MG tablet Take 1 tablet (70 mg total) by mouth every 7 (seven) days. Take with a full glass of water on an empty stomach.  4 tablet  11  . aspirin 81 MG tablet Take 81 mg by mouth daily.        Marland Kitchen  atorvastatin (LIPITOR) 20 MG tablet Take 1 tablet (20 mg total) by mouth daily.  30 tablet  11  . Calcium Carbonate-Vitamin D (CALCIUM-VITAMIN D) 500-200 MG-UNIT per tablet Take 1 tablet by mouth 2 (two) times daily with a meal.        . Cholecalciferol (VITAMIN D3) 2000 UNITS TABS Take 1 tablet by mouth daily.        Marland Kitchen gabapentin (NEURONTIN) 100 MG tablet Take 2 tablets by mouth at bedtime as needed      . omeprazole (PRILOSEC) 20 MG capsule Take 20 mg by mouth daily.        Marland Kitchen VITAMIN A PO Take 1 capsule by mouth daily.        . vitamin B-12 (CYANOCOBALAMIN) 250 MCG tablet Take 250 mcg by mouth daily.        . vitamin C (ASCORBIC ACID) 500 MG tablet Take 500 mg by mouth daily.        . vitamin E 1000 UNIT capsule Take 1,000 Units by mouth daily.        . tacrolimus (PROTOPIC) 0.1 % ointment Apply as directed          Allergies  Allergen Reactions  . Sulfonamide Derivatives  Family History  Problem Relation Age of Onset  . Colon cancer Neg Hx   . Heart disease Father   . Lung cancer Brother     x 2     History   Social History  . Marital Status: Widowed    Spouse Name: N/A    Number of Children: 2  . Years of Education: N/A   Occupational History  . housewife    Social History Main Topics  . Smoking status: Never Smoker   . Smokeless tobacco: None  . Alcohol Use: No  . Drug Use: No  . Sexually Active: None   Other Topics Concern  . None   Social History Narrative  . None     REVIEW OF SYSTEMS - PERTINENT POSITIVES ONLY: 12 point review of systems negative other than HPI and PMH except for occasional urinary incontinence.    EXAM: Filed Vitals:   12/29/10 1145  BP: 142/90  Pulse: 45  Temp: 97.3 F (36.3 C)  Resp: 18    Gen:  No acute distress.  Well nourished and well groomed.  In great shape for any age.   Neurological: Alert and oriented to person, place, and time. Coordination normal.  Head: Normocephalic and atraumatic.  Eyes: Conjunctivae  are normal. Pupils are equal, round, and reactive to light. No scleral icterus.  Neck: Normal range of motion. Neck supple. No tracheal deviation or thyromegaly present.  Cardiovascular: Normal rate, regular rhythm, normal heart sounds and intact distal pulses.  Exam reveals no gallop and no friction rub.  No murmur heard. Respiratory: Effort normal.  No respiratory distress. No chest wall tenderness. Breath sounds normal.  No wheezes, rales or rhonchi.  GI: Soft. Bowel sounds are normal. The abdomen is soft and nontender.  There is no rebound and no guarding.  Rectal exam:  No palpable mass.  Anus with slightly decreased tone.   Musculoskeletal: Normal range of motion. Extremities are nontender.  Lymphadenopathy: No cervical, preauricular, postauricular or axillary adenopathy is present Skin: Skin is warm and dry. No rash noted. No diaphoresis. No erythema. No pallor. No clubbing, cyanosis, or edema.   Psychiatric: Normal mood and affect. Behavior is normal. Judgment and thought content normal.    LABORATORY RESULTS: Available labs are reviewed in Epic.  CEA 2.3    RADIOLOGY RESULTS: See E-Chart or I-Site for most recent results.  Images and reports are reviewed.   Rectal cancer, at 7 cm, uT2N0 cM0 Pt in good condition.   Would be good candidate for lap LAR, but pt is 88 and very active.   Pt may decide to try transanal excision.  Lesion at the upper limits of size and location.   Discussed risks and benefits of both surgeries.   Lesion is T2 on ultrasound, so would not do neoadjuvant therapy.   Pt to talk to daughter and son, and daughter to call me hopefully tomorrow.   I think she would tolerate lap LAR and would not necessarily need a stoma.    Left pelvic cystic mass. Pt will likely need opinion of OB/GYN if Dr. Carlean Purl did not already arrange.        Milus Height MD Surgical Oncology, [facility]      Visit  Diagnoses: 1. Rectal cancer, at 7 cm, uT2N0 cM0   2. Left pelvic cystic mass.     Primary Care Physician: Noralee Space, MD, MD

## 2010-12-29 NOTE — Assessment & Plan Note (Signed)
Pt in good condition.   Would be good candidate for lap LAR, but pt is 88 and very active.   Pt may decide to try transanal excision.  Lesion at the upper limits of size and location.   Discussed risks and benefits of both surgeries.   Lesion is T2 on ultrasound, so would not do neoadjuvant therapy.   Pt to talk to daughter and son, and daughter to call me hopefully tomorrow.   I think she would tolerate lap LAR and would not necessarily need a stoma.

## 2011-01-05 ENCOUNTER — Encounter (INDEPENDENT_AMBULATORY_CARE_PROVIDER_SITE_OTHER): Payer: Self-pay | Admitting: General Surgery

## 2011-01-06 ENCOUNTER — Other Ambulatory Visit (INDEPENDENT_AMBULATORY_CARE_PROVIDER_SITE_OTHER): Payer: Self-pay | Admitting: General Surgery

## 2011-01-06 ENCOUNTER — Telehealth (INDEPENDENT_AMBULATORY_CARE_PROVIDER_SITE_OTHER): Payer: Self-pay

## 2011-01-06 ENCOUNTER — Ambulatory Visit (INDEPENDENT_AMBULATORY_CARE_PROVIDER_SITE_OTHER): Payer: Medicare Other | Admitting: General Surgery

## 2011-01-06 VITALS — BP 152/86 | HR 62 | Temp 97.8°F | Resp 14 | Ht 65.0 in | Wt 142.2 lb

## 2011-01-06 DIAGNOSIS — C20 Malignant neoplasm of rectum: Secondary | ICD-10-CM

## 2011-01-06 NOTE — Progress Notes (Signed)
Hannah Mercado is seeing me today for a second opinion. She has a new diagnosis of rectal cancer. She saw Dr. Barry Dienes last week and treatment options were discussed in detail. She is interested in a laparoscopic low anterior resection. She wants my opinion regarding her treatment options. I have reviewed her notes. She also has a left pelvic cystic mass.    After discussion with her and reviewing her notes I concur with Dr. Marlowe Aschoff recommendation. I think she should see her gynecologist which is Dr. Ulanda Edison regarding the pelvic mass. This may need to be removed at the same time and he may need to be involved. I told her I would be glad to assist Dr. Barry Dienes and she would like this if it would be possible. We will arrange for a referral to Dr. Ulanda Edison. I will talk with Dr. Barry Dienes about scheduling her surgery.

## 2011-01-11 ENCOUNTER — Telehealth (INDEPENDENT_AMBULATORY_CARE_PROVIDER_SITE_OTHER): Payer: Self-pay

## 2011-01-13 ENCOUNTER — Telehealth (INDEPENDENT_AMBULATORY_CARE_PROVIDER_SITE_OTHER): Payer: Self-pay

## 2011-01-13 NOTE — Telephone Encounter (Signed)
Telephone call completed.

## 2011-01-13 NOTE — Telephone Encounter (Signed)
Hannah Mercado called me concerned that her surgery was scheduled for Nov. 19th.  She felt that was too far out and was worried.  I explained that 3-4 weeks was the norm for a coordinated surgery, and assured her that it was as soon as they could schedule for a coordinated case.  I calmed her, and said if she had any questions or concerns she could call me anytime.

## 2011-01-27 ENCOUNTER — Encounter (HOSPITAL_COMMUNITY): Payer: Self-pay | Admitting: Pharmacy Technician

## 2011-01-29 ENCOUNTER — Other Ambulatory Visit: Payer: Self-pay | Admitting: Obstetrics and Gynecology

## 2011-02-01 ENCOUNTER — Encounter (HOSPITAL_COMMUNITY): Payer: Self-pay

## 2011-02-01 ENCOUNTER — Encounter (HOSPITAL_COMMUNITY): Payer: Medicare Other

## 2011-02-01 ENCOUNTER — Other Ambulatory Visit: Payer: Self-pay

## 2011-02-01 ENCOUNTER — Other Ambulatory Visit: Payer: Self-pay | Admitting: Obstetrics and Gynecology

## 2011-02-01 LAB — URINALYSIS, ROUTINE W REFLEX MICROSCOPIC
Hgb urine dipstick: NEGATIVE
Protein, ur: NEGATIVE mg/dL
Specific Gravity, Urine: 1.009 (ref 1.005–1.030)
Urobilinogen, UA: 0.2 mg/dL (ref 0.0–1.0)

## 2011-02-01 LAB — DIFFERENTIAL
Eosinophils Relative: 1 % (ref 0–5)
Lymphocytes Relative: 22 % (ref 12–46)
Lymphs Abs: 1.7 10*3/uL (ref 0.7–4.0)
Monocytes Absolute: 0.6 10*3/uL (ref 0.1–1.0)
Monocytes Relative: 7 % (ref 3–12)

## 2011-02-01 LAB — COMPREHENSIVE METABOLIC PANEL
AST: 20 U/L (ref 0–37)
Albumin: 4.3 g/dL (ref 3.5–5.2)
BUN: 14 mg/dL (ref 6–23)
CO2: 29 mEq/L (ref 19–32)
Calcium: 10 mg/dL (ref 8.4–10.5)
Chloride: 103 mEq/L (ref 96–112)
Creatinine, Ser: 0.84 mg/dL (ref 0.50–1.10)
GFR calc non Af Amer: 61 mL/min — ABNORMAL LOW (ref >90)
Total Bilirubin: 0.3 mg/dL (ref 0.3–1.2)

## 2011-02-01 LAB — SURGICAL PCR SCREEN: Staphylococcus aureus: NEGATIVE

## 2011-02-01 LAB — CBC
HCT: 40.6 % (ref 36.0–46.0)
Hemoglobin: 12.8 g/dL (ref 12.0–15.0)
MCV: 99.3 fL (ref 78.0–100.0)
RBC: 4.09 MIL/uL (ref 3.87–5.11)
WBC: 7.9 10*3/uL (ref 4.0–10.5)

## 2011-02-01 LAB — URINE MICROSCOPIC-ADD ON

## 2011-02-01 NOTE — Progress Notes (Signed)
Quick Note:  Labs ok for surgery ______ 

## 2011-02-01 NOTE — Patient Instructions (Addendum)
[place]  02/01/2011   Your procedure is scheduled on:  02/08/11 [id number] pm   Report to [facility] at [place] AM.  Call this number if you have problems the morning of surgery: [phone]   Remember:One day bowel prep per office of Dr Barry Dienes    Do not eat food:After Midnight.  Do not drink clear liquids: After Midnight.  Take these medicines the morning of surgery with A SIP OF WATER: Prilosec  Bring inhaler with you  Use inhaler if needed am of surgery    Do not wear jewelry, make-up or nail polish.  Do not wear lotions, powders, or perfumes.    Do not shave 48 hours prior to surgery.  Do not bring valuables to the hospital.  Contacts, dentures or bridgework may not be worn into surgery.  Leave suitcase in the car. After surgery it may be brought to your room.  For patients admitted to the hospital, checkout time is 11:00 AM the day of discharge.     Special Instructions: CHG Shower Use Special Wash: 1/2 bottle night before surgery and 1/2 bottle morning of surgery. Wash chin to toes Use regular soap for face and private parts.   Please read over the following fact sheets that you were given: MRSA Information, blood transfusion fact sheet

## 2011-02-02 NOTE — Pre-Procedure Instructions (Signed)
02/01/11 CXR done 10/09/10 report on chart. CT abd/pelvis 11/30/10 on chart and U/S pelvis 12/04/10 on chart .

## 2011-02-07 ENCOUNTER — Other Ambulatory Visit: Payer: Self-pay | Admitting: Obstetrics and Gynecology

## 2011-02-08 ENCOUNTER — Other Ambulatory Visit: Payer: Self-pay | Admitting: Obstetrics and Gynecology

## 2011-02-08 ENCOUNTER — Encounter (HOSPITAL_COMMUNITY): Payer: Self-pay

## 2011-02-08 ENCOUNTER — Inpatient Hospital Stay: Admit: 2011-02-08 | Payer: Self-pay | Admitting: General Surgery

## 2011-02-08 ENCOUNTER — Inpatient Hospital Stay (HOSPITAL_COMMUNITY)
Admission: RE | Admit: 2011-02-08 | Discharge: 2011-02-13 | DRG: 334 | Disposition: A | Discharge status: Home / Self Care | Payer: Medicare Other | Source: Ambulatory Visit | Attending: General Surgery | Admitting: General Surgery

## 2011-02-08 ENCOUNTER — Other Ambulatory Visit (INDEPENDENT_AMBULATORY_CARE_PROVIDER_SITE_OTHER): Payer: Self-pay | Admitting: General Surgery

## 2011-02-08 ENCOUNTER — Encounter (HOSPITAL_COMMUNITY): Payer: Self-pay | Admitting: *Deleted

## 2011-02-08 ENCOUNTER — Encounter (HOSPITAL_COMMUNITY)
Admission: RE | Disposition: A | Discharge status: Home / Self Care | Payer: Self-pay | Source: Ambulatory Visit | Attending: General Surgery

## 2011-02-08 ENCOUNTER — Other Ambulatory Visit: Payer: Self-pay

## 2011-02-08 ENCOUNTER — Inpatient Hospital Stay (HOSPITAL_COMMUNITY): Payer: Medicare Other | Admitting: *Deleted

## 2011-02-08 DIAGNOSIS — Z8701 Personal history of pneumonia (recurrent): Secondary | ICD-10-CM

## 2011-02-08 DIAGNOSIS — Z79899 Other long term (current) drug therapy: Secondary | ICD-10-CM

## 2011-02-08 DIAGNOSIS — N83209 Unspecified ovarian cyst, unspecified side: Secondary | ICD-10-CM | POA: Diagnosis present

## 2011-02-08 DIAGNOSIS — I959 Hypotension, unspecified: Secondary | ICD-10-CM | POA: Diagnosis not present

## 2011-02-08 DIAGNOSIS — Z882 Allergy status to sulfonamides status: Secondary | ICD-10-CM

## 2011-02-08 DIAGNOSIS — M899 Disorder of bone, unspecified: Secondary | ICD-10-CM | POA: Diagnosis present

## 2011-02-08 DIAGNOSIS — C189 Malignant neoplasm of colon, unspecified: Secondary | ICD-10-CM

## 2011-02-08 DIAGNOSIS — J45909 Unspecified asthma, uncomplicated: Secondary | ICD-10-CM | POA: Diagnosis present

## 2011-02-08 DIAGNOSIS — J8410 Pulmonary fibrosis, unspecified: Secondary | ICD-10-CM | POA: Insufficient documentation

## 2011-02-08 DIAGNOSIS — M1990 Unspecified osteoarthritis, unspecified site: Secondary | ICD-10-CM | POA: Diagnosis present

## 2011-02-08 DIAGNOSIS — C20 Malignant neoplasm of rectum: Secondary | ICD-10-CM | POA: Diagnosis present

## 2011-02-08 DIAGNOSIS — F411 Generalized anxiety disorder: Secondary | ICD-10-CM | POA: Diagnosis present

## 2011-02-08 DIAGNOSIS — M949 Disorder of cartilage, unspecified: Secondary | ICD-10-CM | POA: Diagnosis present

## 2011-02-08 DIAGNOSIS — K449 Diaphragmatic hernia without obstruction or gangrene: Secondary | ICD-10-CM | POA: Diagnosis present

## 2011-02-08 DIAGNOSIS — J9600 Acute respiratory failure, unspecified whether with hypoxia or hypercapnia: Secondary | ICD-10-CM

## 2011-02-08 DIAGNOSIS — K219 Gastro-esophageal reflux disease without esophagitis: Secondary | ICD-10-CM | POA: Insufficient documentation

## 2011-02-08 DIAGNOSIS — N83202 Unspecified ovarian cyst, left side: Secondary | ICD-10-CM | POA: Insufficient documentation

## 2011-02-08 DIAGNOSIS — I059 Rheumatic mitral valve disease, unspecified: Secondary | ICD-10-CM | POA: Diagnosis present

## 2011-02-08 DIAGNOSIS — E7800 Pure hypercholesterolemia, unspecified: Secondary | ICD-10-CM | POA: Diagnosis present

## 2011-02-08 DIAGNOSIS — Z801 Family history of malignant neoplasm of trachea, bronchus and lung: Secondary | ICD-10-CM

## 2011-02-08 DIAGNOSIS — I498 Other specified cardiac arrhythmias: Secondary | ICD-10-CM | POA: Diagnosis not present

## 2011-02-08 DIAGNOSIS — I509 Heart failure, unspecified: Secondary | ICD-10-CM

## 2011-02-08 DIAGNOSIS — Z7982 Long term (current) use of aspirin: Secondary | ICD-10-CM

## 2011-02-08 HISTORY — PX: OVARIAN CYST REMOVAL: SHX89

## 2011-02-08 HISTORY — PX: COLON RESECTION: SHX5231

## 2011-02-08 HISTORY — PX: OTHER SURGICAL HISTORY: SHX169

## 2011-02-08 LAB — COMPREHENSIVE METABOLIC PANEL
Alkaline Phosphatase: 41 U/L (ref 39–117)
BUN: 9 mg/dL (ref 6–23)
CO2: 20 mEq/L (ref 19–32)
GFR calc Af Amer: >90 mL/min (ref >90)
GFR calc non Af Amer: 78 mL/min — ABNORMAL LOW (ref >90)
Glucose, Bld: 455 mg/dL — ABNORMAL HIGH (ref 70–99)
Potassium: 3.3 mEq/L — ABNORMAL LOW (ref 3.5–5.1)
Total Bilirubin: 0.2 mg/dL — ABNORMAL LOW (ref 0.3–1.2)
Total Protein: 5.3 g/dL — ABNORMAL LOW (ref 6.0–8.3)

## 2011-02-08 LAB — ABO/RH: ABO/RH(D): A POS

## 2011-02-08 LAB — CBC
MCH: 31.6 pg (ref 26.0–34.0)
MCHC: 32.5 g/dL (ref 30.0–36.0)
Platelets: 183 10*3/uL (ref 150–400)

## 2011-02-08 LAB — TYPE AND SCREEN: ABO/RH(D): A POS

## 2011-02-08 LAB — BASIC METABOLIC PANEL
BUN: 10 mg/dL (ref 6–23)
Creatinine, Ser: 0.65 mg/dL (ref 0.50–1.10)
GFR calc Af Amer: >90 mL/min (ref >90)
GFR calc non Af Amer: 78 mL/min — ABNORMAL LOW (ref >90)

## 2011-02-08 LAB — CREATININE, SERUM: Creatinine, Ser: 0.67 mg/dL (ref 0.50–1.10)

## 2011-02-08 LAB — CARDIAC PANEL(CRET KIN+CKTOT+MB+TROPI): CK, MB: 4.1 ng/mL — ABNORMAL HIGH (ref 0.3–4.0)

## 2011-02-08 SURGERY — LAPAROSCOPIC SIGMOID COLON RESECTION
Anesthesia: General

## 2011-02-08 SURGERY — LAPAROSCOPIC SIGMOID COLON RESECTION
Anesthesia: General | Site: Abdomen | Wound class: Clean Contaminated

## 2011-02-08 MED ORDER — DIPHENHYDRAMINE HCL 50 MG/ML IJ SOLN: 12.5000 mg | Freq: Four times a day (QID) | INTRAMUSCULAR | Status: DC | PRN

## 2011-02-08 MED ORDER — BUPIVACAINE 0.25 % ON-Q PUMP DUAL CATH 300 ML
300.0000 mL | INJECTION | Status: DC
  Filled 2011-02-08: qty 300

## 2011-02-08 MED ORDER — ALVIMOPAN 12 MG PO CAPS
12.0000 mg | ORAL_CAPSULE | Freq: Two times a day (BID) | ORAL | Status: DC
  Administered 2011-02-09 – 2011-02-13 (×9): 12 mg via ORAL
  Filled 2011-02-08 (×10): qty 1

## 2011-02-08 MED ORDER — CISATRACURIUM BESYLATE 2 MG/ML IV SOLN
INTRAVENOUS | Status: DC | PRN
  Administered 2011-02-08: 2 mg via INTRAVENOUS
  Administered 2011-02-08: 4 mg via INTRAVENOUS
  Administered 2011-02-08: 2 mg via INTRAVENOUS
  Administered 2011-02-08: 10 mg via INTRAVENOUS
  Administered 2011-02-08: 4 mg via INTRAVENOUS

## 2011-02-08 MED ORDER — PROPOFOL 10 MG/ML IV EMUL
INTRAVENOUS | Status: DC | PRN
  Administered 2011-02-08: 50 ug/kg/min via INTRAVENOUS
  Administered 2011-02-08: 100 ug/kg/min via INTRAVENOUS

## 2011-02-08 MED ORDER — ALVIMOPAN 12 MG PO CAPS
12.0000 mg | ORAL_CAPSULE | Freq: Once | ORAL | Status: AC
  Administered 2011-02-08: 12 mg via ORAL

## 2011-02-08 MED ORDER — SODIUM CHLORIDE 0.9 % IV SOLN
INTRAVENOUS | Status: AC
  Filled 2011-02-08: qty 50

## 2011-02-08 MED ORDER — LIDOCAINE-EPINEPHRINE (PF) 1 %-1:200000 IJ SOLN
INTRAMUSCULAR | Status: AC
  Filled 2011-02-08: qty 10

## 2011-02-08 MED ORDER — DIPHENHYDRAMINE HCL 12.5 MG/5ML PO ELIX: 12.5000 mg | ORAL_SOLUTION | Freq: Four times a day (QID) | ORAL | Status: DC | PRN

## 2011-02-08 MED ORDER — ENOXAPARIN SODIUM 40 MG/0.4ML ~~LOC~~ SOLN
40.0000 mg | SUBCUTANEOUS | Status: DC
  Administered 2011-02-08 – 2011-02-12 (×5): 40 mg via SUBCUTANEOUS
  Filled 2011-02-08 (×6): qty 0.4

## 2011-02-08 MED ORDER — ACETAMINOPHEN 10 MG/ML IV SOLN
1000.0000 mg | Freq: Four times a day (QID) | INTRAVENOUS | Status: AC
  Administered 2011-02-08 – 2011-02-09 (×4): 1000 mg via INTRAVENOUS
  Filled 2011-02-08 (×4): qty 100

## 2011-02-08 MED ORDER — BUPIVACAINE HCL (PF) 0.25 % IJ SOLN
INTRAMUSCULAR | Status: DC | PRN
  Administered 2011-02-08: 30 mL

## 2011-02-08 MED ORDER — EPHEDRINE SULFATE 50 MG/ML IJ SOLN
INTRAMUSCULAR | Status: DC | PRN
  Administered 2011-02-08: 5 mg via INTRAVENOUS

## 2011-02-08 MED ORDER — BUPIVACAINE ON-Q PAIN PUMP (FOR ORDER SET NO CHG)
INJECTION | Status: DC
  Filled 2011-02-08: qty 1

## 2011-02-08 MED ORDER — ALBUTEROL SULFATE HFA 108 (90 BASE) MCG/ACT IN AERS
INHALATION_SPRAY | RESPIRATORY_TRACT | Status: AC
  Filled 2011-02-08: qty 6.7

## 2011-02-08 MED ORDER — NEOSTIGMINE METHYLSULFATE 1 MG/ML IJ SOLN
INTRAMUSCULAR | Status: DC | PRN
  Administered 2011-02-08 (×2): 2 mg via INTRAVENOUS

## 2011-02-08 MED ORDER — LIDOCAINE HCL (CARDIAC) 20 MG/ML IV SOLN
INTRAVENOUS | Status: DC | PRN
  Administered 2011-02-08: 50 mg via INTRAVENOUS

## 2011-02-08 MED ORDER — FENTANYL CITRATE 0.05 MG/ML IJ SOLN
25.0000 ug | INTRAMUSCULAR | Status: AC | PRN
  Administered 2011-02-08 (×6): 25 ug via INTRAVENOUS

## 2011-02-08 MED ORDER — GLYCOPYRROLATE 0.2 MG/ML IJ SOLN
INTRAMUSCULAR | Status: DC | PRN
  Administered 2011-02-08: .2 mg via INTRAVENOUS
  Administered 2011-02-08: 0.2 mg via INTRAVENOUS
  Administered 2011-02-08: .9 mg via INTRAVENOUS
  Administered 2011-02-08: 0.2 mg via INTRAVENOUS
  Administered 2011-02-08: 0.1 mg via INTRAVENOUS

## 2011-02-08 MED ORDER — FENTANYL CITRATE 0.05 MG/ML IJ SOLN
INTRAMUSCULAR | Status: AC
  Filled 2011-02-08: qty 2

## 2011-02-08 MED ORDER — CEFAZOLIN SODIUM 1-5 GM-% IV SOLN
INTRAVENOUS | Status: AC
  Filled 2011-02-08: qty 50

## 2011-02-08 MED ORDER — ACETAMINOPHEN 10 MG/ML IV SOLN
INTRAVENOUS | Status: AC
  Filled 2011-02-08: qty 100

## 2011-02-08 MED ORDER — ONDANSETRON HCL 4 MG PO TABS: 4.0000 mg | ORAL_TABLET | Freq: Four times a day (QID) | ORAL | Status: DC | PRN

## 2011-02-08 MED ORDER — ONDANSETRON HCL 4 MG/2ML IJ SOLN: 4.0000 mg | Freq: Four times a day (QID) | INTRAMUSCULAR | Status: DC | PRN

## 2011-02-08 MED ORDER — ONDANSETRON HCL 4 MG/2ML IJ SOLN
INTRAMUSCULAR | Status: DC | PRN
  Administered 2011-02-08 (×4): 1 mg via INTRAVENOUS

## 2011-02-08 MED ORDER — ALENDRONATE SODIUM 70 MG PO TABS
70.0000 mg | ORAL_TABLET | ORAL | Status: DC
Start: 2011-02-08 — End: 2011-02-08

## 2011-02-08 MED ORDER — LACTATED RINGERS IV SOLN
INTRAVENOUS | Status: DC | PRN
  Administered 2011-02-08 (×4): via INTRAVENOUS

## 2011-02-08 MED ORDER — FENTANYL CITRATE 0.05 MG/ML IJ SOLN
INTRAMUSCULAR | Status: DC | PRN
  Administered 2011-02-08 (×2): 50 ug via INTRAVENOUS
  Administered 2011-02-08: 100 ug via INTRAVENOUS
  Administered 2011-02-08: 25 ug via INTRAVENOUS
  Administered 2011-02-08 (×2): 50 ug via INTRAVENOUS
  Administered 2011-02-08: 100 ug via INTRAVENOUS
  Administered 2011-02-08: 50 ug via INTRAVENOUS
  Administered 2011-02-08: 25 ug via INTRAVENOUS
  Administered 2011-02-08: 50 ug via INTRAVENOUS

## 2011-02-08 MED ORDER — BUPIVACAINE 0.25 % ON-Q PUMP DUAL CATH 300 ML
INJECTION | Status: DC | PRN
  Administered 2011-02-08 (×2): 300 mL

## 2011-02-08 MED ORDER — ACETAMINOPHEN 10 MG/ML IV SOLN
INTRAVENOUS | Status: DC | PRN
  Administered 2011-02-08: 1000 mg via INTRAVENOUS

## 2011-02-08 MED ORDER — GABAPENTIN 100 MG PO CAPS
100.0000 mg | ORAL_CAPSULE | Freq: Every evening | ORAL | Status: DC | PRN
  Filled 2011-02-08: qty 1

## 2011-02-08 MED ORDER — SODIUM CHLORIDE 0.9 % IV SOLN: 1.0000 g | INTRAVENOUS | Status: DC

## 2011-02-08 MED ORDER — ALBUTEROL SULFATE HFA 108 (90 BASE) MCG/ACT IN AERS
2.0000 | INHALATION_SPRAY | Freq: Four times a day (QID) | RESPIRATORY_TRACT | Status: DC | PRN
  Filled 2011-02-08: qty 6.7

## 2011-02-08 MED ORDER — KCL IN DEXTROSE-NACL 20-5-0.45 MEQ/L-%-% IV SOLN
INTRAVENOUS | Status: DC
  Administered 2011-02-08: 100 mL/h via INTRAVENOUS
  Administered 2011-02-09 – 2011-02-11 (×5): via INTRAVENOUS
  Filled 2011-02-08 (×9): qty 1000

## 2011-02-08 MED ORDER — SODIUM CHLORIDE 0.9 % IV SOLN
1.0000 g | INTRAVENOUS | Status: DC | PRN
  Administered 2011-02-08: 1 g via INTRAVENOUS

## 2011-02-08 MED ORDER — NALOXONE HCL 0.4 MG/ML IJ SOLN: 0.4000 mg | INTRAMUSCULAR | Status: DC | PRN

## 2011-02-08 MED ORDER — LACTATED RINGERS IV SOLN
INTRAVENOUS | Status: DC | PRN
  Administered 2011-02-08 (×4): via INTRAVENOUS

## 2011-02-08 MED ORDER — LACTATED RINGERS IR SOLN
Status: DC | PRN
  Administered 2011-02-08: 1000 mL

## 2011-02-08 MED ORDER — SODIUM CHLORIDE 0.9 % IJ SOLN: 9.0000 mL | INTRAMUSCULAR | Status: DC | PRN

## 2011-02-08 MED ORDER — HYDROMORPHONE HCL PF 1 MG/ML IJ SOLN
INTRAMUSCULAR | Status: DC | PRN
  Administered 2011-02-08: .2 mg via INTRAVENOUS
  Administered 2011-02-08: 1 mg via INTRAVENOUS
  Administered 2011-02-08 (×2): .4 mg via INTRAVENOUS

## 2011-02-08 MED ORDER — DEXAMETHASONE SODIUM PHOSPHATE 10 MG/ML IJ SOLN
INTRAMUSCULAR | Status: DC | PRN
  Administered 2011-02-08: 10 mg via INTRAVENOUS

## 2011-02-08 MED ORDER — CEFAZOLIN SODIUM 1-5 GM-% IV SOLN
INTRAVENOUS | Status: DC | PRN
  Administered 2011-02-08: 1 g via INTRAVENOUS

## 2011-02-08 MED ORDER — MORPHINE SULFATE (PF) 1 MG/ML IV SOLN
INTRAVENOUS | Status: DC
  Administered 2011-02-08: 1 mg via INTRAVENOUS
  Administered 2011-02-09: 12:00:00 via INTRAVENOUS
  Administered 2011-02-09: 3 mg via INTRAVENOUS
  Administered 2011-02-09: 5 mg via INTRAVENOUS
  Administered 2011-02-09: 9 mg via INTRAVENOUS
  Administered 2011-02-09: 2 mg via INTRAVENOUS
  Administered 2011-02-09: 8 mg via INTRAVENOUS
  Administered 2011-02-09: 5 mg via INTRAVENOUS
  Administered 2011-02-10: via INTRAVENOUS
  Administered 2011-02-10: 2 mg via INTRAVENOUS
  Administered 2011-02-10: 15 mg via INTRAVENOUS
  Administered 2011-02-10: 07:00:00 via INTRAVENOUS
  Administered 2011-02-10: 4 mg via INTRAVENOUS
  Administered 2011-02-10: 2 mg via INTRAVENOUS
  Administered 2011-02-10: 3 mg via INTRAVENOUS
  Administered 2011-02-11: 5.23 mg via INTRAVENOUS
  Administered 2011-02-11: 16.5 mL via INTRAVENOUS
  Filled 2011-02-08 (×5): qty 25

## 2011-02-08 MED ORDER — PROPOFOL 10 MG/ML IV EMUL
INTRAVENOUS | Status: DC | PRN
  Administered 2011-02-08: 100 mg via INTRAVENOUS
  Administered 2011-02-08: 20 mg via INTRAVENOUS

## 2011-02-08 SURGICAL SUPPLY — 130 items
APL SKNCLS STERI-STRIP NONHPOA (GAUZE/BANDAGES/DRESSINGS) ×3
APPLICATOR COTTON TIP 6IN STRL (MISCELLANEOUS) ×2 IMPLANT
APPLIER CLIP 5 13 M/L LIGAMAX5 (MISCELLANEOUS)
APPLIER CLIP ROT 10 11.4 M/L (STAPLE)
APR CLP MED LRG 11.4X10 (STAPLE)
APR CLP MED LRG 5 ANG JAW (MISCELLANEOUS)
ATTRACTOMAT 16X20 MAGNETIC DRP (DRAPES) ×2 IMPLANT
BAG SPEC RTRVL LRG 6X4 10 (ENDOMECHANICALS) ×3
BAG URINE DRAINAGE (UROLOGICAL SUPPLIES) ×2 IMPLANT
BENZOIN TINCTURE PRP APPL 2/3 (GAUZE/BANDAGES/DRESSINGS) ×2 IMPLANT
BLADE EXTENDED COATED 6.5IN (ELECTRODE) ×4 IMPLANT
BLADE HEX COATED 2.75 (ELECTRODE) ×4 IMPLANT
BLADE SURG SZ10 CARB STEEL (BLADE) ×4 IMPLANT
CABLE HIGH FREQUENCY MONO STRZ (ELECTRODE) ×4 IMPLANT
CANISTER SUCTION 2500CC (MISCELLANEOUS) ×8 IMPLANT
CANNULA ENDOPATH XCEL 11M (ENDOMECHANICALS) IMPLANT
CELLS DAT CNTRL 66122 CELL SVR (MISCELLANEOUS) IMPLANT
CLIP APPLIE 5 13 M/L LIGAMAX5 (MISCELLANEOUS) IMPLANT
CLIP APPLIE ROT 10 11.4 M/L (STAPLE) IMPLANT
CLIP TI MEDIUM LARGE 6 (CLIP) ×12 IMPLANT
CLOSURE STERI STRIP 1/2 X4 (GAUZE/BANDAGES/DRESSINGS) ×2 IMPLANT
CLOTH BEACON ORANGE TIMEOUT ST (SAFETY) ×6 IMPLANT
CONT PATH 16OZ SNAP LID 3702 (MISCELLANEOUS) ×4 IMPLANT
COVER MAYO STAND STRL (DRAPES) ×4 IMPLANT
COVER SURGICAL LIGHT HANDLE (MISCELLANEOUS) ×4 IMPLANT
DECANTER SPIKE VIAL GLASS SM (MISCELLANEOUS) ×2 IMPLANT
DISSECTOR BLUNT TIP ENDO 5MM (MISCELLANEOUS) IMPLANT
DRAPE LAPAROSCOPIC ABDOMINAL (DRAPES) ×4 IMPLANT
DRAPE LG THREE QUARTER DISP (DRAPES) ×2 IMPLANT
DRAPE UTILITY 15X26 (DRAPE) ×4 IMPLANT
DRAPE WARM FLUID 44X44 (DRAPE) ×10 IMPLANT
DRSG COVADERM 4X8 (GAUZE/BANDAGES/DRESSINGS) ×2 IMPLANT
DRSG TEGADERM 2-3/8X2-3/4 SM (GAUZE/BANDAGES/DRESSINGS) ×2 IMPLANT
DRSG TEGADERM 4X4.75 (GAUZE/BANDAGES/DRESSINGS) ×2 IMPLANT
DRSG VASELINE 3X18 (GAUZE/BANDAGES/DRESSINGS) ×4 IMPLANT
ELECT REM PT RETURN 9FT ADLT (ELECTROSURGICAL) ×8
ELECTRODE REM PT RTRN 9FT ADLT (ELECTROSURGICAL) ×6 IMPLANT
FILTER SMOKE EVAC LAPAROSHD (FILTER) IMPLANT
GAUZE SPONGE 2X2 8PLY STRL LF (GAUZE/BANDAGES/DRESSINGS) ×1 IMPLANT
GAUZE SPONGE 4X4 12PLY STRL LF (GAUZE/BANDAGES/DRESSINGS) ×4 IMPLANT
GAUZE SPONGE 4X4 16PLY XRAY LF (GAUZE/BANDAGES/DRESSINGS) ×4 IMPLANT
GLOVE BIO SURGEON STRL SZ 6 (GLOVE) ×12 IMPLANT
GLOVE BIO SURGEON STRL SZ7.5 (GLOVE) ×8 IMPLANT
GLOVE BIOGEL M STRL SZ7.5 (GLOVE) ×20 IMPLANT
GLOVE BIOGEL PI IND STRL 6.5 (GLOVE) ×3 IMPLANT
GLOVE BIOGEL PI IND STRL 7.0 (GLOVE) ×3 IMPLANT
GLOVE BIOGEL PI INDICATOR 6.5 (GLOVE) ×1
GLOVE BIOGEL PI INDICATOR 7.0 (GLOVE) ×1
GLOVE INDICATOR 6.5 STRL GRN (GLOVE) ×8 IMPLANT
GOWN PREVENTION PLUS LG XLONG (DISPOSABLE) ×8 IMPLANT
GOWN PREVENTION PLUS XLARGE (GOWN DISPOSABLE) ×4 IMPLANT
GOWN PREVENTION PLUS XXLARGE (GOWN DISPOSABLE) ×4 IMPLANT
GOWN STRL REIN XL XLG (GOWN DISPOSABLE) ×4 IMPLANT
KIT BASIN OR (CUSTOM PROCEDURE TRAY) ×4 IMPLANT
LEGGING LITHOTOMY PAIR STRL (DRAPES) ×2 IMPLANT
LIGASURE IMPACT 36 18CM CVD LR (INSTRUMENTS) IMPLANT
NS IRRIG 1000ML POUR BTL (IV SOLUTION) ×18 IMPLANT
PACK ABDOMINAL WL (CUSTOM PROCEDURE TRAY) ×6 IMPLANT
PAD OB MATERNITY 4.3X12.25 (PERSONAL CARE ITEMS) ×4 IMPLANT
PENCIL BUTTON HOLSTER BLD 10FT (ELECTRODE) ×4 IMPLANT
POUCH SPECIMEN RETRIEVAL 10MM (ENDOMECHANICALS) ×2 IMPLANT
RELOAD BLUE (STAPLE) ×2 IMPLANT
RETRACTOR WND ALEXIS 18 MED (MISCELLANEOUS) IMPLANT
RETRACTOR WND ALEXIS 25 LRG (MISCELLANEOUS) IMPLANT
RTRCTR WOUND ALEXIS 18CM MED (MISCELLANEOUS)
RTRCTR WOUND ALEXIS 25CM LRG (MISCELLANEOUS)
SCALPEL HARMONIC ACE (MISCELLANEOUS) IMPLANT
SCISSORS LAP 5X35 DISP (ENDOMECHANICALS) ×4 IMPLANT
SET IRRIG TUBING LAPAROSCOPIC (IRRIGATION / IRRIGATOR) ×2 IMPLANT
SHEET LAVH (DRAPES) ×4 IMPLANT
SLEEVE SURGEON STRL (DRAPES) ×4 IMPLANT
SOLUTION ANTI FOG 6CC (MISCELLANEOUS) ×4 IMPLANT
SPONGE GAUZE 2X2 STER 10/PKG (GAUZE/BANDAGES/DRESSINGS) ×1
SPONGE GAUZE 4X4 12PLY (GAUZE/BANDAGES/DRESSINGS) ×4 IMPLANT
SPONGE LAP 18X18 X RAY DECT (DISPOSABLE) ×14 IMPLANT
SPONGE LAP 4X18 X RAY DECT (DISPOSABLE) ×4 IMPLANT
STAPLE ECHEON FLEX 60 POW ENDO (STAPLE) ×2 IMPLANT
STAPLER CIRC CVD 29MM 37CM (STAPLE) ×2 IMPLANT
STAPLER VISISTAT 35W (STAPLE) ×4 IMPLANT
SUCTION POOLE TIP (SUCTIONS) ×4 IMPLANT
SUT ETHILON 1 LR 30 (SUTURE) IMPLANT
SUT MNCRL AB 4-0 PS2 18 (SUTURE) ×4 IMPLANT
SUT PDS AB 0 CT1 36 (SUTURE) ×2 IMPLANT
SUT PDS AB 0 CTX 60 (SUTURE) ×4 IMPLANT
SUT PDS AB 1 CTX 36 (SUTURE) ×4 IMPLANT
SUT PROLENE 0 SH 30 (SUTURE) ×2 IMPLANT
SUT PROLENE 2 0 SH DA (SUTURE) IMPLANT
SUT SILK 2 0 (SUTURE)
SUT SILK 2 0 30  PSL (SUTURE)
SUT SILK 2 0 30 PSL (SUTURE) IMPLANT
SUT SILK 2 0 SH CR/8 (SUTURE) IMPLANT
SUT SILK 2-0 18XBRD TIE 12 (SUTURE) ×2 IMPLANT
SUT SILK 3 0 (SUTURE)
SUT SILK 3 0 SH CR/8 (SUTURE) IMPLANT
SUT SILK 3-0 18XBRD TIE 12 (SUTURE) IMPLANT
SUT VIC AB 0 CT1 18XCR BRD8 (SUTURE) ×6 IMPLANT
SUT VIC AB 0 CT1 27 (SUTURE)
SUT VIC AB 0 CT1 27XBRD ANBCTR (SUTURE) ×6 IMPLANT
SUT VIC AB 0 CT1 36 (SUTURE) ×6 IMPLANT
SUT VIC AB 0 CT1 8-18 (SUTURE)
SUT VIC AB 2-0 CT2 27 (SUTURE) IMPLANT
SUT VIC AB 2-0 SH 18 (SUTURE) ×2 IMPLANT
SUT VIC AB 2-0 SH 27 (SUTURE)
SUT VIC AB 2-0 SH 27X BRD (SUTURE) ×4 IMPLANT
SUT VIC AB 3-0 CTX 36 (SUTURE) ×2 IMPLANT
SUT VIC AB 3-0 SH 18 (SUTURE) ×2 IMPLANT
SUT VIC AB 3-0 SH 27 (SUTURE)
SUT VIC AB 3-0 SH 27X BRD (SUTURE) IMPLANT
SUT VIC AB 4-0 PS1 27 (SUTURE) ×4 IMPLANT
SUT VICRYL 0 TIES 12 18 (SUTURE) ×4 IMPLANT
SUT VICRYL 2 0 18  UND BR (SUTURE)
SUT VICRYL 2 0 18 UND BR (SUTURE) IMPLANT
SYR 50ML LL SCALE MARK (SYRINGE) ×2 IMPLANT
SYS LAPSCP GELPORT 120MM (MISCELLANEOUS) ×4
SYSTEM LAPSCP GELPORT 120MM (MISCELLANEOUS) ×1 IMPLANT
TOWEL OR 17X24 6PK STRL BLUE (TOWEL DISPOSABLE) ×4 IMPLANT
TOWEL OR 17X26 10 PK STRL BLUE (TOWEL DISPOSABLE) ×8 IMPLANT
TOWEL OR NON WOVEN STRL DISP B (DISPOSABLE) ×4 IMPLANT
TRAY FOLEY CATH 14FR (SET/KITS/TRAYS/PACK) ×2 IMPLANT
TRAY FOLEY CATH 14FRSI W/METER (CATHETERS) ×6 IMPLANT
TRAY LAP CHOLE (CUSTOM PROCEDURE TRAY) ×4 IMPLANT
TROCAR ADV FIXATION 5X100MM (TROCAR) ×2 IMPLANT
TROCAR BLADELESS OPT 5 75 (ENDOMECHANICALS) ×6 IMPLANT
TROCAR XCEL 12X100 BLDLESS (ENDOMECHANICALS) ×2 IMPLANT
TROCAR XCEL BLUNT TIP 100MML (ENDOMECHANICALS) ×2 IMPLANT
TROCAR XCEL NON-BLD 11X100MML (ENDOMECHANICALS) IMPLANT
WATER STERILE IRR 1000ML POUR (IV SOLUTION) ×4 IMPLANT
WATER STERILE IRR 1500ML POUR (IV SOLUTION) ×4 IMPLANT
YANKAUER SUCT BULB TIP 10FT TU (MISCELLANEOUS) ×4 IMPLANT
YANKAUER SUCT BULB TIP NO VENT (SUCTIONS) ×4 IMPLANT

## 2011-02-08 NOTE — H&P (Signed)
NAME:  Hannah Mercado, TOP NO.:  MEDICAL RECORD NO.:  [id number]  LOCATION:                                 FACILITY:  PHYSICIAN:  Lucille Passy. Ulanda Edison, M.D. DATE OF BIRTH:  1922/07/20  DATE OF ADMISSION:  02/08/2011 DATE OF DISCHARGE:                             HISTORY & PHYSICAL   HISTORY OF PRESENT ILLNESS:  This is an 77 year old white female who was admitted to the hospital for treatment of rectal cancer and incidentally, she has an enlarged ovary that is going to be removed at the same time.  This patient was noted to have rectal cancer and during a CT scan, it was noted to have an enlargement of an ovary.  She is going to have the rectal cancer removed and the enlarged ovary will be removed at the same time.  The ovarian cyst does not show evidence of any malignancy.  It is a simple cyst with a septation.  PAST MEDICAL HISTORY:  Reveals allergy to SULFA.  She has had a breast biopsy, 2 tubal pregnancies, vaginal hysterectomy, A and P repair, and appendectomy.  She has had usual childhood diseases.  She does not drink, smoke, or take drugs.  FAMILY HISTORY:  Revealed her mother and father died at 38 and 27 respectively, 2 brothers died in 71 and in the 38s from lung cancer and 1 sister died in her 43s.  PHYSICAL EXAMINATION:  GENERAL:  On admission, well-developed, slender white female, in no distress. VITAL SIGNS:  Blood pressure is 130/78, pulse is 80. HEART:  There are occasional premature beats.  Heart normal size and sounds.  No murmurs. LUNGS:  Clear to auscultation. ABDOMEN:  Soft.  There is a midline scar from the pubis to the umbilicus.  PELVIC:  Exam reveals lichen sclerosus is present on the vulva.  The opening to the vagina is single digit.  There are no pelvic masses felt.  I can feel the cancer of the rectum on rectal exam, high in the rectum at about 8 cm.  ADMITTING IMPRESSION:  Rectal cancer, enlarged ovary.  PLAN:  To do  laparoscopy and hopefully remove the affected tube and ovary through laparoscopic intervention and possibly, the other tube and ovary if cannot be removed laparoscopically, then a consideration will be given to removing it by making an incision.  The patient understands the risks of surgery, and she understands that the ovarian cyst is a __________ indication for this procedure.     Lucille Passy. Ulanda Edison, M.D.     TFH/MEDQ  D:  02/07/2011  T:  02/07/2011  Job:  [id number]

## 2011-02-08 NOTE — Preoperative (Signed)
Beta Blockers   Reason not to administer Beta Blockers:Not Applicable 

## 2011-02-08 NOTE — Transfer of Care (Signed)
Immediate Anesthesia Transfer of Care Note  Patient: Hannah Mercado  Procedure(s) Performed:  LAPAROSCOPIC SIGMOID COLON RESECTION - Laparoscopic Lower Anterior Bowel Resection; OVARIAN CYSTECTOMY - Removal of Left Ovary and Pelvic Mass  Patient Location: PACU  Anesthesia Type: General  Level of Consciousness: awake, sedated and patient cooperative  Airway & Oxygen Therapy: Patient Spontanous Breathing and Patient connected to face mask oxygen  Post-op Assessment: Report given to PACU RN, Post -op Vital signs reviewed and stable and Patient moving all extremities X 4  Post vital signs: Reviewed and stable  Complications: No apparent anesthesia complications

## 2011-02-08 NOTE — Progress Notes (Signed)
Patient name: Hannah Mercado Medical record number: [id number] Date of birth: 11-Aug-1922 Age: 77 y.o. Gender: female PCP: Noralee Space, MD, MD  Date: 02/08/2011 Reason for Consult: medical rx/ hypotension Referring Physician: Norman Clay   Brief history 77 year old female s/p  laparoscopic partial colectomy for Rectal Cancer, uT2N0M0. Rectal cancer on 11/18. Returned to the ICU post-op. Intra-op noted to have episodes of bradycardia and hypotension. She is followed by Dr Lenna Gilford in the [place] setting. PCCM asked to eval to assist with post-op management.   Lines/tubes  Culture data/sepsis markers  Antibiotics invanz  (surgical prophy)11/19>>>  Best practice ppi 11/19 lmwh 11/19  Protocols/consults  Events/studies 11/19: s/p  laparoscopic partial colectomy for Rectal Cancer, uT2N0M0.  HPI:  77 year old female s/p  laparoscopic partial colectomy for Rectal Cancer, uT2N0M0. Rectal cancer on 11/18. Returns to the ICU post-op. Intra-op noted to have episodes of bradycardia and hypotension. She is followed by Dr Lenna Gilford in the [place] setting. PCCM asked to eval to assist with post-op management.    Past Medical History  Diagnosis Date  . Allergy history unknown   . History of pneumonia     right middle lobe  . Pulmonary fibrosis, postinflammatory     pt denies at this visit   . Mitral valve prolapse     pt denies at this visit   . Hypercholesteremia   . Hiatal hernia   . GERD (gastroesophageal reflux disease)   . DJD (degenerative joint disease)   . Osteopenia   . Anxiety   . Asthmatic bronchitis     use inhaler prn   . Blood transfusion     ? with ectopic pregnancy 50 yrs ago   . Cancer     rectal cancer     Past Surgical History  Procedure Date  . Nasal sinus surgery 1994  . Hammer toe surgery 1995    Dr. Silverio Decamp  . Appendectomy   . Colonoscopy 11/27/10    rectal cancer, small sigmoid adenoma, diverticulosis  . Vaginal hysterectomy 1980's  . Ectopic pregnancy  surgery     Midline incision    Family History  Problem Relation Age of Onset  . Colon cancer Neg Hx   . Heart disease Father   . Lung cancer Brother     x 2    Social History:  reports that she has never smoked. She has never used smokeless tobacco. She reports that she does not drink alcohol or use illicit drugs.  Allergies:  Allergies  Allergen Reactions  . Sulfonamide Derivatives Nausea And Vomiting    Happened a long time ago, does not remember the other reaction she had.    Medications:  Prior to Admission medications   Medication Sig Start Date End Date Taking? Authorizing Provider  alendronate (FOSAMAX) 70 MG tablet Take 1 tablet (70 mg total) by mouth every 7 (seven) days. Take with a full glass of water on an empty stomach. 10/09/10  Yes Noralee Space, MD  ascorbic acid (VITAMIN C) 250 MG CHEW Chew 250 mg by mouth daily.    Yes Historical Provider, MD  aspirin 81 MG tablet Take 81 mg by mouth every morning.    Yes Historical Provider, MD  Calcium Carbonate-Vitamin D (CALCIUM-VITAMIN D) 500-200 MG-UNIT per tablet Take 2 tablets by mouth daily.    Yes Historical Provider, MD  Cholecalciferol (VITAMIN D3) 1000 UNITS CAPS Take 1 capsule by mouth daily.    Yes Historical Provider, MD  gabapentin (NEURONTIN) 100  MG tablet Take 100 mg by mouth at bedtime as needed. Pain    Yes Historical Provider, MD  omeprazole (PRILOSEC) 20 MG capsule Take 20 mg by mouth daily.    Yes Historical Provider, MD  vitamin A 8000 UNIT capsule Take 8,000 Units by mouth daily.    Yes Historical Provider, MD  vitamin B-12 (CYANOCOBALAMIN) 250 MCG tablet Take 250 mcg by mouth daily.    Yes Historical Provider, MD  vitamin E (VITAMIN E) 200 UNIT capsule Take 200 Units by mouth daily.    Yes Historical Provider, MD  albuterol (PROVENTIL HFA;VENTOLIN HFA) 108 (90 BASE) MCG/ACT inhaler Inhale 2 puffs into the lungs every 6 (six) hours as needed. Wheezing  10/09/10   Noralee Space, MD  atorvastatin (LIPITOR)  20 MG tablet Take 20 mg by mouth at bedtime.  10/09/10   Noralee Space, MD     Temp:  [96.1 F (35.6 C)-98.1 F (36.7 C)] 96.1 F (35.6 C) (11/19 1424) Pulse Rate:  [52-75] 56  (11/19 1430) Resp:  [9-18] 18  (11/19 1546) BP: (128-170)/(53-73) 150/73 mmHg (11/19 1430) SpO2:  [95 %-100 %] 100 % (11/19 1546) Weight:  [65 kg (143 lb 4.8 oz)] 143 lb 4.8 oz (65 kg) (11/19 1430)    Intake/Output Summary (Last 24 hours) at 02/08/11 1616 Last data filed at 02/08/11 1400  Gross per 24 hour  Intake   4725 ml  Output   1950 ml  Net   2775 ml   Physical exam General: well developed 78 year old female currently no acute distress s/p lab. HENT: N/C, no JVD, no adenpoathy. MM moist. Pulm: clear, equal no accessory muscle use. Card: no MMR, bradycardia on tele. Abd: soft, tender to palp. Dressing w/ staining. Pain cath in place. GU: foley to straight drain. Neuro: a&O   radiology    LAB RESULT Lab Results  Component Value Date   CREATININE 0.67 02/08/2011   BUN 10 02/08/2011   NA 138 02/08/2011   K 3.5 02/08/2011   CL 103 02/08/2011   CO2 26 02/08/2011   Lab Results  Component Value Date   WBC 11.8* 02/08/2011   HGB 11.0* 02/08/2011   HCT 33.8* 02/08/2011   MCV 97.1 02/08/2011   PLT 183 02/08/2011   Lab Results  Component Value Date   ALT 5 02/08/2011   AST 13 02/08/2011   ALKPHOS 41 02/08/2011   BILITOT 0.2* 02/08/2011   Lab Results  Component Value Date   INR 0.96 02/01/2011    Assessment and Plan  1)  Rectal cancer, at 7 cm, uT2N0 cM0. S/p lap per gen surg.  2) post-op bradycardia. Currently clinically stable. Has had isolated episode of hypotension.  No results found for this basename: CKTOTAL, CKMB, CKMBINDEX, TROPONINI  plan: -check EKG -cycle CEs -careful w/ narcotics, and avoid hypoxia -watch in ICU  3) PULMONARY FIBROSIS, POSTINFLAMMATORY.  Relatively well compensated at present, monitor sats, early mobilization and IS     4)  GERD Plan: -ppi   Christinia Gully, MD [facility] [phone]

## 2011-02-08 NOTE — H&P (Signed)
Chief Complaint   Patient presents with   .  Other     rectal lesion    HISTORY:  Pt is 77 year old female that presents with screening detected blood per rectum on hemoccult cards. Colonoscopy revealed rectal mass at 7 cm on the right lateral wall. Biopsies positive for cancer. She has only seen blood in stool once. She has not had symptoms of anemia. She denies decreased stool caliber. She has had slightly worse constipation recently, and she has had to increase laxative intake. She has been very healthy her entire life. She plays golf several times/week and does all her own housework. She has undergone EUS by Dr. Ardis Hughs and this mass is a T2. She also has had a CT scan which is negative for metastatic disease. She does, however, have a pelvic mass suspicious for a ovarian mass. Ultrasound was not conclusive.   Past Medical History   Diagnosis  Date   .  Allergy history unknown    .  Asthmatic bronchitis    .  History of pneumonia      right middle lobe   .  Pulmonary fibrosis, postinflammatory    .  Mitral valve prolapse    .  Hypercholesteremia    .  Hiatal hernia    .  GERD (gastroesophageal reflux disease)    .  DJD (degenerative joint disease)    .  Osteopenia    .  Anxiety     Past Surgical History   Procedure  Date   .  Nasal sinus surgery  1994   .  Hammer toe surgery  1995     Dr. Silverio Decamp   .  Appendectomy    .  Colonoscopy  11/27/10     rectal cancer, small sigmoid adenoma, diverticulosis   .  Vaginal hysterectomy  1980's   .  Ectopic pregnancy surgery      Midline incision    Current Outpatient Prescriptions   Medication  Sig  Dispense  Refill   .  albuterol (PROAIR HFA) 108 (90 BASE) MCG/ACT inhaler  Inhale 2 puffs into the lungs every 6 (six) hours as needed.  1 Inhaler  11   .  alendronate (FOSAMAX) 70 MG tablet  Take 1 tablet (70 mg total) by mouth every 7 (seven) days. Take with a full glass of water on an empty stomach.  4 tablet  11   .  aspirin 81 MG  tablet  Take 81 mg by mouth daily.     Marland Kitchen  atorvastatin (LIPITOR) 20 MG tablet  Take 1 tablet (20 mg total) by mouth daily.  30 tablet  11   .  Calcium Carbonate-Vitamin D (CALCIUM-VITAMIN D) 500-200 MG-UNIT per tablet  Take 1 tablet by mouth 2 (two) times daily with a meal.     .  Cholecalciferol (VITAMIN D3) 2000 UNITS TABS  Take 1 tablet by mouth daily.     Marland Kitchen  gabapentin (NEURONTIN) 100 MG tablet  Take 2 tablets by mouth at bedtime as needed     .  omeprazole (PRILOSEC) 20 MG capsule  Take 20 mg by mouth daily.     Marland Kitchen  VITAMIN A PO  Take 1 capsule by mouth daily.     .  vitamin B-12 (CYANOCOBALAMIN) 250 MCG tablet  Take 250 mcg by mouth daily.     .  vitamin C (ASCORBIC ACID) 500 MG tablet  Take 500 mg by mouth daily.     Marland Kitchen  vitamin E 1000 UNIT capsule  Take 1,000 Units by mouth daily.     .  tacrolimus (PROTOPIC) 0.1 % ointment  Apply as directed      Allergies   Allergen  Reactions   .  Sulfonamide Derivatives     Family History   Problem  Relation  Age of Onset   .  Colon cancer  Neg Hx    .  Heart disease  Father    .  Lung cancer  Brother       x 2    History    Social History   .  Marital Status:  Widowed     Spouse Name:  N/A     Number of Children:  2   .  Years of Education:  N/A    Occupational History   .  housewife     Social History Main Topics   .  Smoking status:  Never Smoker   .  Smokeless tobacco:  None   .  Alcohol Use:  No   .  Drug Use:  No   .  Sexually Active:  None     REVIEW OF SYSTEMS - PERTINENT POSITIVES ONLY:  12 point review of systems negative other than HPI and PMH except for occasional urinary incontinence.   EXAM:  Filed Vitals:    12/29/10 1145   BP:  142/90   Pulse:  45   Temp:  97.3 F (36.3 C)   Resp:  18     Gen: No acute distress. Well nourished and well groomed. In great shape for any age.  Neurological: Alert and oriented to person, place, and time. Coordination normal.  Head: Normocephalic and atraumatic.  Eyes:  Conjunctivae are normal. Pupils are equal, round, and reactive to light. No scleral icterus.  Neck: Normal range of motion. Neck supple. No tracheal deviation or thyromegaly present.  Cardiovascular: Normal rate, regular rhythm, normal heart sounds and intact distal pulses. Exam reveals no gallop and no friction rub. No murmur heard.  Respiratory: Effort normal. No respiratory distress. No chest wall tenderness. Breath sounds normal. No wheezes, rales or rhonchi.  GI: Soft. Bowel sounds are normal. The abdomen is soft and nontender. There is no rebound and no guarding.  Rectal exam: No palpable mass. Anus with slightly decreased tone.  Musculoskeletal: Normal range of motion. Extremities are nontender.  Lymphadenopathy: No cervical, preauricular, postauricular or axillary adenopathy is present Skin: Skin is warm and dry. No rash noted. No diaphoresis. No erythema. No pallor. No clubbing, cyanosis, or edema.  Psychiatric: Normal mood and affect. Behavior is normal. Judgment and thought content normal.   LABORATORY RESULTS:  Available labs are reviewed in Epic. CEA 2.3   RADIOLOGY RESULTS:  See E-Chart or I-Site for most recent results. Images and reports are reviewed.  Rectal cancer, at 7 cm, uT2N0 cM0  Pt in good condition.  Would be good candidate for lap LAR, but pt is 88 and very active.  Pt may decide to try transanal excision. Lesion at the upper limits of size and location.  Discussed risks and benefits of both surgeries.  Lesion is T2 on ultrasound, so would not do neoadjuvant therapy.  Pt to talk to daughter and son, and daughter to call me hopefully tomorrow.  I think she would tolerate lap LAR and would not necessarily need a stoma.  Left pelvic cystic mass.  Pt will likely need opinion of OB/GYN if Dr. Carlean Purl did not already arrange.

## 2011-02-08 NOTE — Interval H&P Note (Signed)
History and Physical Interval Note:   02/08/2011  7:14 AM  Hannah Mercado  has presented today for surgery, with the diagnosis of rectal cancer  The various methods of treatment have been discussed with the patient and family. After consideration of risks, benefits and other options for treatment, the patient has consented to  Procedure(s): [place] as a surgical intervention .  The patients' history has been reviewed, patient examined, no change in status, stable for surgery.  I have reviewed the patients' chart and labs.  Questions were answered to the patient's satisfaction.     [facility]  MD

## 2011-02-08 NOTE — Op Note (Signed)
Indications: This patient presents for a laparoscopic partial colectomy for Rectal Cancer, uT2N0M0.  Rectal cancer discovered after FOBT positive.   Incidentally noted was a Left septated pelvic mass.  Patient had normal CEA and no evidence for metastatic disease.    Pre-operative Diagnosis: Rectal Cancer, uT2N0M0   Post-operative Diagnosis: Rectal Cancer, uT2N0M0   Procedure:  Laparoscopic Left oophorectomy, laparoscopic low anterior resection, placement of OnQ pain pump.  Surgeon: Stark Klein   Assistants: Newton Pigg (FOR OOPHORECTOMY) AND ROSENBOWER, TODD (FOR LAR)  Anesthesia: General endotracheal anesthesia   ASA Class: 3      Procedure Details  The patient was seen in the Holding Room. The risks, benefits, complications, treatment options, and expected outcomes were discussed with the patient. The possibilities of reaction to medication, bleeding, Infection, recurrent cancer, the need for additional procedures, failure to diagnose a condition, and creating a complication requiring transfusion or operation were discussed with the patient. The patient was advised of the risk of ostomy. The patient concurred with the proposed plan, giving informed consent. The patient was taken to the operating room, identified, and the procedure verified. A Time Out was held and the above information confirmed.   The patient was brought to the operating room and placed supine. After induction of a general anesthetic, a Foley catheter was inserted and the abdomen was prepped and draped in standard fashion. Local anesthetic was infiltrated around the umbilicus. The skin was incised vertically with a #11 blade. The subcutaneous tissues were divided bluntly with a Kelly clamp. The fascia was elevated and incised with a #11 blade. A Claiborne Billings was used to confirm entrance into the peritoneal cavity. A 0 Vicryl pursestring suture was placed around the fascial incision. The Hasson was introduced into the abdomen and  held in place to the abdominal wall with the tails of the suture. Pneumoperitoneum was insufflated to a pressure of 15 mm Hg. This was decreased to 12 mmHg due to bradycardia in the patient.  The laparoscope was introduced.   Exploration revealed a normal omentum, colon, small bowel, peritoneum, liver, and stomach. Two left lateral and one right lateral 5-mm trocars were then placed after anesthetizing the skin and peritoneum with Marcaine. The midline adhesions were taken down with the scissors and the EnSeal.  Two 5 mm trocars were placed similarly on the right side.  The left ovary was seen with a large benign appearing cyst.  This was freed up from the surrounding structures with the EnSeal.  It was then placed in the Endocatch bag, aspirated with a needle and removed through the Hasson trocar.  The right ovary was normal in appearance.  The descending colon and sigmoid colon were then mobilized with gentle retraction of the colon in a medial direction with mobilization of the peritoneal reflection with cautery and the Enseal. Mobilization of this area was complete to expose the retroperitoneum. The ureter was identified during this mobilization process but no structures were divided during this mobilization. There was no blood loss during this portion of the procedure.   The mobilization continued to include the rectum circumferentially down into the pelvis. The left and right hypogastric plexus was identified and avoided. Posteriorly, care was taken to preserve the mesorectum. A hand port was placed vertically in the low midline to assist with visualization.  The mesentery was divided high at the inferior mesenteric artery with the EnSeal. The remaining mesentery was divided with the EnSeal. The distal portion was identified from the rectum with rectal exam to  make sure adequate mobilization was achieved.  The colon was resected distally with the powered Echelon 60 mm  stapler 2 cm distal to the area in  question in regard to the specimen. The rectum was removed through the hand port.  An appropriate site for transection was identified, and a curved bowel clamp was placed on the sigmoid.  The bowel was transected with the cautery.    The end of the colon was sized and a 29 mm EEA was selected. A 0 Prolene suture was used to create a pursestring suture around the anvil. The EEA was advanced through the rectum. The spike was deployed through the rectal wall. Prior to deployment, the vagina was checked to confirm that the stapler was in the rectum. The anvil was coupled to the spike. The surrounding tissues were held out of the way and the stapler closed. The position was checked to make sure that the colon was not twisted, and that it was not under tension.  The stapler was fired and pressure was held for 30 seconds. The stapler was opened 3 half turns and removed.    The proctoscope was advanced through the anus and the rectum was insufflated. There was no evidence of a leak. The pelvis was irrigated and there was no evidence of bleeding. There was no evidence of metastatic disease in the liver, either.   The OnQ pump catheters were placed with the tunneler. The peritoneum was closed with a 0-0 Vicryl. The fascial incision was then closed with a running #1 PDS suture. The skin was interrupted with deep dermal 3-0 vicryl sutures and 4-0 Monocryl sutures.  Umbilical tape wicks were placed. Steri-Strips were applied.    The 12-mm trocar incision was closed with a 0 Vicryl suture. The soft tissue was irrigated and the incisions were closed with  4-0 monocryl subcuticular closure.   Instrument, sponge, and needle counts were correct prior to abdominal closure and at the conclusion of the case.   Findings: Rectal cancer at 7 cm  Estimated Blood Loss: 100 mL   Drains: None   Total IV Fluids: 4100 crystalloid  Specimens: L ovarian cyst fluid for cytology, L ovary and pelvic mass, rectosigmoid colon with   proximal end open and proximal anastamotic ring, final distal margin, and peritoneal nodule.  Complications: The patient had a labile heart rate and blood pressure during the case.     Disposition: PACU - hemodynamically stable.   Condition: stable

## 2011-02-08 NOTE — Anesthesia Preprocedure Evaluation (Addendum)
Anesthesia Evaluation  Patient identified by MRN, date of birth, ID band Patient awake    Reviewed: Allergy & Precautions, H&P , NPO status , Patient's Chart, lab work & pertinent test results  Airway Mallampati: I TM Distance: >3 FB Neck ROM: Full    Dental  (+) Edentulous Upper and Edentulous Lower   Pulmonary neg pulmonary ROS, asthma ,    + decreased breath sounds      Cardiovascular neg cardio ROS Irregular Normal    Neuro/Psych Negative Neurological ROS  Negative Psych ROS   GI/Hepatic negative GI ROS, Neg liver ROS, hiatal hernia, GERD-  ,  Endo/Other  Negative Endocrine ROS  Renal/GU negative Renal ROS  Genitourinary negative   Musculoskeletal negative musculoskeletal ROS (+)   Abdominal Normal abdominal exam  (+)   Peds negative pediatric ROS (+)  Hematology negative hematology ROS (+)   Anesthesia Other Findings   Reproductive/Obstetrics negative OB ROS                          Anesthesia Physical Anesthesia Plan  ASA: III  Anesthesia Plan: General   Post-op Pain Management:    Induction: Intravenous  Airway Management Planned: Oral ETT  Additional Equipment:   Intra-op Plan:   Post-operative Plan: Extubation in OR and Possible Post-op intubation/ventilation  Informed Consent: I have reviewed the patients History and Physical, chart, labs and discussed the procedure including the risks, benefits and alternatives for the proposed anesthesia with the patient or authorized representative who has indicated his/her understanding and acceptance.   Dental advisory given  Plan Discussed with: CRNA  Anesthesia Plan Comments:         Anesthesia Quick Evaluation

## 2011-02-08 NOTE — Progress Notes (Signed)

## 2011-02-08 NOTE — Anesthesia Postprocedure Evaluation (Signed)
  Anesthesia Post-op Note  Patient: Hannah Mercado  Procedure(s) Performed:  LAPAROSCOPIC SIGMOID COLON RESECTION - Laparoscopic Lower Anterior Bowel Resection; OVARIAN CYSTECTOMY - Removal of Left Ovary and Pelvic Mass  Patient Location: PACU  Anesthesia Type: General  Level of Consciousness: awake and alert   Airway and Oxygen Therapy: Patient Spontanous Breathing  Post-op Pain: mild  Post-op Assessment: Post-op Vital signs reviewed, Patient's Cardiovascular Status Stable, Respiratory Function Stable, Patent Airway and No signs of Nausea or vomiting  Post-op Vital Signs: stable  Complications: No apparent anesthesia complications

## 2011-02-09 ENCOUNTER — Encounter: Payer: Self-pay | Admitting: Internal Medicine

## 2011-02-09 DIAGNOSIS — I498 Other specified cardiac arrhythmias: Secondary | ICD-10-CM

## 2011-02-09 DIAGNOSIS — J9600 Acute respiratory failure, unspecified whether with hypoxia or hypercapnia: Secondary | ICD-10-CM

## 2011-02-09 DIAGNOSIS — J8410 Pulmonary fibrosis, unspecified: Secondary | ICD-10-CM

## 2011-02-09 LAB — COMPREHENSIVE METABOLIC PANEL
ALT: 7 U/L (ref 0–35)
Calcium: 7.7 mg/dL — ABNORMAL LOW (ref 8.4–10.5)
Creatinine, Ser: 0.78 mg/dL (ref 0.50–1.10)
GFR calc Af Amer: 85 mL/min — ABNORMAL LOW (ref >90)
Glucose, Bld: 145 mg/dL — ABNORMAL HIGH (ref 70–99)
Sodium: 135 mEq/L (ref 135–145)
Total Protein: 5.5 g/dL — ABNORMAL LOW (ref 6.0–8.3)

## 2011-02-09 LAB — CARDIAC PANEL(CRET KIN+CKTOT+MB+TROPI)
CK, MB: 3.4 ng/mL (ref 0.3–4.0)
CK, MB: 2.8 ng/mL (ref 0.3–4.0)
Relative Index: 3.3 — ABNORMAL HIGH (ref 0.0–2.5)
Relative Index: 2.4 (ref 0.0–2.5)
Total CK: 104 U/L (ref 7–177)
Total CK: 116 U/L (ref 7–177)
Troponin I: <0.3 ng/mL (ref <0.30)

## 2011-02-09 LAB — CBC
Hemoglobin: 9.7 g/dL — ABNORMAL LOW (ref 12.0–15.0)
MCH: 31.9 pg (ref 26.0–34.0)
MCHC: 32.7 g/dL (ref 30.0–36.0)
MCV: 97.7 fL (ref 78.0–100.0)

## 2011-02-09 LAB — MAGNESIUM: Magnesium: 1.4 mg/dL — ABNORMAL LOW (ref 1.5–2.5)

## 2011-02-09 MED ORDER — MAGNESIUM SULFATE BOLUS VIA INFUSION
4.0000 g | Freq: Once | INTRAVENOUS | Status: DC
Start: 2011-02-09 — End: 2011-02-09
  Filled 2011-02-09 (×3): qty 500

## 2011-02-09 MED ORDER — MAGNESIUM SULFATE 40 MG/ML IJ SOLN
4.0000 g | Freq: Once | INTRAMUSCULAR | Status: AC
  Administered 2011-02-09: 4 g via INTRAVENOUS
  Filled 2011-02-09: qty 100

## 2011-02-09 MED ORDER — VITAMINS A & D EX OINT
TOPICAL_OINTMENT | CUTANEOUS | Status: AC
  Administered 2011-02-09: 23:00:00
  Filled 2011-02-09: qty 5

## 2011-02-09 NOTE — Plan of Care (Signed)
Problem: Phase I Progression Outcomes Goal: Pain controlled with appropriate interventions Outcome: Progressing Pain controlled with PCA morphine.

## 2011-02-09 NOTE — Progress Notes (Signed)
1 Day Post-Op  Subjective: Pt had significant bradycardia in the OR and PACU yesterday.  This has resolved overnight.  Pt denies nausea.  Pain controlled with PCA.  Objective: Vital signs in last 24 hours: Temp:  [96.1 F (35.6 C)-98.3 F (36.8 C)] 98.3 F (36.8 C) (11/20 0429) Pulse Rate:  [52-71] 70  (11/20 0500) Resp:  [9-21] 16  (11/20 0500) BP: (105-170)/(36-73) 105/36 mmHg (11/20 0500) SpO2:  [95 %-100 %] 96 % (11/20 0500) Weight:  [143 lb 4.8 oz (65 kg)-147 lb 11.3 oz (67 kg)] 147 lb 11.3 oz (67 kg) (11/20 0008)    Intake/Output from previous day: 11/19 0701 - 11/20 0700 In: 6425 [I.V.:6225; IV Piggyback:200] Out: 2900 [Urine:2800; Blood:100] Intake/Output this shift:    General appearance: alert, cooperative and no distress Head: Normocephalic, without obvious abnormality, atraumatic Resp: clear to auscultation bilaterally GI: Appropriately tender, Dressing ok.  OnQ in place.   Extremities: extremities normal, atraumatic, no cyanosis or edema  Lab Results:   [facility] 02/09/11 0625 02/08/11 1539  WBC 8.9 11.8*  HGB 9.7* 11.0*  HCT 29.7* 33.8*  PLT 170 183   BMET  Basename 02/09/11 0625 02/08/11 1539 02/08/11 1308  NA 135 -- 138  K 4.2 -- 3.5  CL 103 -- 103  CO2 25 -- 26  GLUCOSE 145* -- 162*  BUN 11 -- 10  CREATININE 0.78 0.67 --  CALCIUM 7.7* -- 8.6    Anti-infectives: Anti-infectives     Start     Dose/Rate Route Frequency Ordered Stop   02/08/11 0605   ertapenem (INVANZ) 1 g in sodium chloride 0.9 % 50 mL IVPB  Status:  Discontinued        1 g 100 mL/hr over 30 Minutes Intravenous Every 24 hours 02/08/11 0605 02/08/11 1426          Assessment/Plan: s/p Procedure(s): LAPAROSCOPIC SIGMOID COLON RESECTION OVARIAN CYSTECTOMY Incentive spirometry, out of bed, sips of clears  Bradycardia: Resolved.  Keep on monitor x 24 hours.  Plan send to floor tomorrow AM.    LOS: 1 day    [facility] 02/09/2011

## 2011-02-09 NOTE — Progress Notes (Signed)
Patient name: Hannah Mercado Medical record number: [id number] Date of birth: 1922-08-03 Age: 77 y.o. Gender: female PCP: Noralee Space, MD, MD  Date: 02/08/2011 Reason for Consult: medical rx/ hypotension Referring Physician: Norman Clay   Brief history 77 year old female s/p  laparoscopic partial colectomy for Rectal Cancer, uT2N0M0. Rectal cancer on 11/18. Returned to the ICU post-op. Intra-op noted to have episodes of bradycardia and hypotension. She is followed by Dr Lenna Gilford in the [place] setting. PCCM asked to eval to assist with post-op management.   Lines/tubes  Culture data/sepsis markers  Antibiotics invanz  (surgical prophy)11/19>>>  Best practice ppi 11/19 lmwh 11/19  Protocols/consults  Events/studies 11/19: s/p  laparoscopic partial colectomy for Rectal Cancer, uT2N0M0.   Alert and approp off 02 sitting in chair doing well with IS   No jvd Oropharynx clear Neck supple Lungs with a few scattered exp > insp rhonchi bilaterally RRR no s3 or or sign murmur Abd obese with excursion ok Extr wam with no edema or clubbing noted Neuro  No motor deficits   BP 123/42  Pulse 75  Temp(Src) 98.3 F (36.8 C) (Oral)  Resp 19  Ht 5\' 7"  (1.702 m)  Wt 147 lb 11.3 oz (67 kg)  BMI 23.13 kg/m2  SpO2 98%    Intake/Output Summary (Last 24 hours) at 02/09/11 1744 Last data filed at 02/09/11 1700  Gross per 24 hour  Intake   2900 ml  Output   1350 ml  Net   1550 ml     Assessment and Plan  1)  Rectal cancer, at 7 cm, uT2N0 cM0. S/p lap per gen surg.  2) post-op bradycardia. Currently clinically stable. Has had isolated episode of hypotension.  Cardiac enzymes neg  Resolved, no further concerns  3) PULMONARY FIBROSIS, POSTINFLAMMATORY.  Relatively well compensated at present, monitor sats, early mobilization and IS , not even on 02     4) GERD Plan: -ppi   CCM to sign off, please call if needed  Christinia Gully, MD Pulmonary and [place] Cell [phone]

## 2011-02-09 NOTE — Progress Notes (Signed)
ON Q pain pump applied to abd incision

## 2011-02-10 ENCOUNTER — Encounter (HOSPITAL_COMMUNITY): Payer: Self-pay | Admitting: General Surgery

## 2011-02-10 LAB — CBC
MCH: 32.1 pg (ref 26.0–34.0)
MCHC: 32.3 g/dL (ref 30.0–36.0)
Platelets: 170 10*3/uL (ref 150–400)
RDW: 13.2 % (ref 11.5–15.5)

## 2011-02-10 LAB — BASIC METABOLIC PANEL
BUN: 7 mg/dL (ref 6–23)
Calcium: 7.7 mg/dL — ABNORMAL LOW (ref 8.4–10.5)
Creatinine, Ser: 0.7 mg/dL (ref 0.50–1.10)
GFR calc Af Amer: 88 mL/min — ABNORMAL LOW (ref >90)
GFR calc non Af Amer: 76 mL/min — ABNORMAL LOW (ref >90)
Potassium: 4 mEq/L (ref 3.5–5.1)

## 2011-02-10 LAB — PHOSPHORUS: Phosphorus: 1.9 mg/dL — ABNORMAL LOW (ref 2.3–4.6)

## 2011-02-10 MED ORDER — SODIUM PHOSPHATE 3 MMOLE/ML IV SOLN
20.0000 mmol | Freq: Once | INTRAVENOUS | Status: AC
  Administered 2011-02-10: 20 mmol via INTRAVENOUS
  Filled 2011-02-10: qty 6.67

## 2011-02-10 NOTE — Progress Notes (Signed)
Notified Will Creig [facility] for Dr. Barry Dienes of pt POD #2 and still has foley, new order obtained to d/c foley cath.  Also notified him of pt no bm x5 days and not passing flatus, no new order given.  Markham Jordan, RN

## 2011-02-10 NOTE — Progress Notes (Signed)
2 Days Post-Op  Subjective: Pt feeling much better overall.  No nausea with clears.  Was out of bed for 2 hours yesterday.  No fevers.  Bradycardia remains resolved.    Objective: Vital signs in last 24 hours: Temp:  [98.3 F (36.8 C)-98.7 F (37.1 C)] 98.5 F (36.9 C) (11/21 0800) Pulse Rate:  [70-80] 71  (11/21 0600) Resp:  [13-23] 15  (11/21 0600) BP: (105-129)/(33-75) 117/45 mmHg (11/21 0600) SpO2:  [92 %-100 %] 95 % (11/21 0600) Weight:  [151 lb 14.4 oz (68.9 kg)] 151 lb 14.4 oz (68.9 kg) (11/21 0400)    Intake/Output from previous day: 11/20 0701 - 11/21 0700 In: 2600 [P.O.:300; I.V.:2100; IV Piggyback:200] Out: 1950 [Urine:1950] Intake/Output this shift:    General appearance: alert, cooperative and no distress Head: Normocephalic, without obvious abnormality, atraumatic GI: Dressings, on Q remain intact, mild distention, approp tender at incisions.   Extremities: extremities normal, atraumatic, no cyanosis or edema  Lab Results:   [facility] 02/10/11 0307 02/09/11 0625  WBC 7.9 8.9  HGB 9.2* 9.7*  HCT 28.5* 29.7*  PLT 170 170   BMET  Basename 02/10/11 0307 02/09/11 0625  NA 136 135  K 4.0 4.2  CL 105 103  CO2 28 25  GLUCOSE 110* 145*  BUN 7 11  CREATININE 0.70 0.78  CALCIUM 7.7* 7.7*   PT/INR No results found for this basename: LABPROT:2,INR:2 in the last 72 hours ABG No results found for this basename: PHART:2,PCO2:2,PO2:2,HCO3:2 in the last 72 hours  Studies/Results: No results found.  Anti-infectives: Anti-infectives     Start     Dose/Rate Route Frequency Ordered Stop   02/08/11 0605   ertapenem (INVANZ) 1 g in sodium chloride 0.9 % 50 mL IVPB  Status:  Discontinued        1 g 100 mL/hr over 30 Minutes Intravenous Every 24 hours 02/08/11 0605 02/08/11 1426          Assessment/Plan: s/p Procedure(s): LAPAROSCOPIC SIGMOID COLON RESECTION OVARIAN CYSTECTOMY d/c foley Remain on clears while no flatus.  Ambulate today.  Transfer to  floor Hypophosphatemia:  Replete.  LOS: 2 days    [facility] 02/10/2011

## 2011-02-10 NOTE — Clinical Documentation Improvement (Signed)
Please update your documentation within the medical record to reflect your response to this query.                                                                                   02/10/11  Dear Dr. Barry Dienes Rolley Sims  In a better effort to capture your patient's severity of illness, reflect appropriate length of stay and utilization of resources, a review of the medical record has revealed the following indicators.    Based on your clinical judgment, please clarify and document in a progress note and/or discharge summary the clinical condition associated with the following supporting information:  I Pt with rectal cancer.  Pt's current H/H=9.2/28.5   Please clarify the underlying diagnosis responsible for the abn H/H lab result  Or another diagnosis and document in pn and d/c summary. -   Other Condition__________________                  Cannot Clinically Determine_________   Clinical Information:  Risk Factors: Rectal cancer  Diagnostics 02/08/2011 15:39 HGB: 11.0 (L) HCT: 33.8 (L)  11/20/201az2 06:25 HGB: 9.7 (L) HCT: 29.7 (L)  02/10/2011 03:07 HGB: 9.2 (L) HCT: 28.5 (L)   Treatment Vit D Vit C Patient Values: [place] H/H Lactated Ringers  n responding to this query please exercise your independent judgment.  The fact that a query is asked, does not imply that any particular answer is desired or expected.  Abnormal findings (laboratory, x-ray, pathologic, and other diagnostic results) are not coded and reported unless the physician indicates their clinical significance.   The medical record reflects the following clinical findings, please clarify the diagnostic and/or clinical significance:         Reviewed:  no additional documentation provided 02/16/11     Thank You,  Kayin Kettering, [place] Documentation Specialist Office

## 2011-02-11 LAB — PHOSPHORUS: Phosphorus: 2.4 mg/dL (ref 2.3–4.6)

## 2011-02-11 LAB — BASIC METABOLIC PANEL
BUN: 5 mg/dL — ABNORMAL LOW (ref 6–23)
CO2: 29 mEq/L (ref 19–32)
Glucose, Bld: 97 mg/dL (ref 70–99)
Potassium: 4.2 mEq/L (ref 3.5–5.1)
Sodium: 136 mEq/L (ref 135–145)

## 2011-02-11 LAB — CBC
HCT: 27.8 % — ABNORMAL LOW (ref 36.0–46.0)
Hemoglobin: 9 g/dL — ABNORMAL LOW (ref 12.0–15.0)
MCH: 32.1 pg (ref 26.0–34.0)
RBC: 2.8 MIL/uL — ABNORMAL LOW (ref 3.87–5.11)

## 2011-02-11 MED ORDER — SODIUM CHLORIDE 0.9 % IJ SOLN
3.0000 mL | INTRAMUSCULAR | Status: DC | PRN
  Administered 2011-02-11: 3 mL via INTRAVENOUS

## 2011-02-11 MED ORDER — ACETAMINOPHEN 325 MG PO TABS: 325.0000 mg | ORAL_TABLET | Freq: Four times a day (QID) | ORAL | Status: DC | PRN

## 2011-02-11 MED ORDER — BUPIVACAINE 0.25 % ON-Q PUMP DUAL CATH 300 ML
300.0000 mL | INJECTION | Status: DC
  Filled 2011-02-11 (×3): qty 300

## 2011-02-11 MED ORDER — LACTATED RINGERS IV BOLUS (SEPSIS): 1000.0000 mL | Freq: Four times a day (QID) | INTRAVENOUS | Status: DC | PRN

## 2011-02-11 MED ORDER — ALUM & MAG HYDROXIDE-SIMETH 400-400-40 MG/5ML PO SUSP
30.0000 mL | Freq: Four times a day (QID) | ORAL | Status: DC | PRN
  Filled 2011-02-11: qty 30

## 2011-02-11 MED ORDER — SODIUM CHLORIDE 0.9 % IJ SOLN
3.0000 mL | Freq: Two times a day (BID) | INTRAMUSCULAR | Status: DC
  Administered 2011-02-11 – 2011-02-13 (×5): 3 mL via INTRAVENOUS

## 2011-02-11 MED ORDER — HYDROCODONE-ACETAMINOPHEN 5-325 MG PO TABS: 1.0000 | ORAL_TABLET | ORAL | Status: DC | PRN

## 2011-02-11 MED ORDER — FLORA-Q PO CAPS
1.0000 | ORAL_CAPSULE | Freq: Every day | ORAL | Status: DC
  Administered 2011-02-11 – 2011-02-13 (×3): 1 via ORAL
  Filled 2011-02-11 (×3): qty 1

## 2011-02-11 MED ORDER — MORPHINE SULFATE 2 MG/ML IJ SOLN: 2.0000 mg | INTRAMUSCULAR | Status: DC | PRN

## 2011-02-11 MED ORDER — ASPIRIN-ACETAMINOPHEN-CAFFEINE 250-250-65 MG PO TABS
1.0000 | ORAL_TABLET | Freq: Three times a day (TID) | ORAL | Status: DC | PRN
  Filled 2011-02-11: qty 1

## 2011-02-11 MED ORDER — PSYLLIUM 95 % PO PACK
1.0000 | PACK | Freq: Two times a day (BID) | ORAL | Status: DC
  Administered 2011-02-11 – 2011-02-13 (×5): 1 via ORAL
  Filled 2011-02-11 (×7): qty 1

## 2011-02-11 MED ORDER — IBUPROFEN 800 MG PO TABS: 400.0000 mg | ORAL_TABLET | Freq: Four times a day (QID) | ORAL | Status: DC | PRN

## 2011-02-11 NOTE — Progress Notes (Signed)
PCP: Noralee Space, MD, MD  Outpatient Care Team: Patient Care Team: Noralee Space, MD as PCP - General (Pulmonary Disease)  Inpatient Treatment Team: Treatment Team: Attending Provider: Stark Klein, MD; Registered Nurse: Wynn Banker, RN; Registered Nurse: Thea Alken, RN; Technician: Pervis Hocking, NT; Respiratory Therapist: Deetta Perla Closson, RRT   LOS: 3 days   3 Days Post-Op  Procedure(s): LAPAROSCOPIC SIGMOID COLON RESECTION OVARIAN CYSTECTOMY  Subjective:  Sore - PCA helps. Not much appetite, but no N/V Family & CNA at bedside.  Numerous ?s asked by them & answered by me   Objective:  Vital signs:  Temp:  [98.2 F (36.8 C)-98.9 F (37.2 C)] 98.4 F (36.9 C) (11/22 0545) Pulse Rate:  [66-79] 79  (11/22 0545) Resp:  [16-21] 18  (11/22 0545) BP: (116-158)/(49-72) 157/72 mmHg (11/22 0545) SpO2:  [94 %-98 %] 97 % (11/22 0948) Weight:  [145 lb 8.1 oz (66 kg)] 145 lb 8.1 oz (66 kg) (11/21 2230)    Intake/Output    from previous day: 11/21 0701 - 11/22 0700 In: 1902.5 [I.V.:1902.5] Out: 1626 [Urine:1625; Stool:1]  this shift:    Flatus: yes BM: yes  Physical Exam:  General: Pt awake/alert/oriented x4 in no acute distress Eyes: PERRL, normal EOM.  Sclera clear.  No icterus Neuro: CN II-XII intact w/o focal sensory/motor deficits. Lymph: No head/neck/groin lymphadenopathy Psych:  No delerium/psychosis/paranoia HENT: Normocephalic, Mucus membranes moist.  No thrush Neck: Supple, No tracheal deviation Chest: Clear lungs No chest wall pain w good excursion CV:  Pulses intact.  Regular rhythm Abdomen: Soft, Mildly tender at incisions.  Obese/pear shape.  ?Mildly distended.  No incarcerated hernias. Ext:  SCDs BLE.  No mjr edema.  No cyanosis Skin: No petechiae / purpurae  Results:   Labs: Results for orders placed during the hospital encounter of 02/08/11 (from the past 48 hour(s))  MAGNESIUM     Status: Normal   Collection Time     02/10/11  3:07 AM      Component Value Range Comment   Magnesium 2.4  1.5 - 2.5 (mg/dL)   BASIC METABOLIC PANEL     Status: Abnormal   Collection Time   02/10/11  3:07 AM      Component Value Range Comment   Sodium 136  135 - 145 (mEq/L)    Potassium 4.0  3.5 - 5.1 (mEq/L)    Chloride 105  96 - 112 (mEq/L)    CO2 28  19 - 32 (mEq/L)    Glucose, Bld 110 (*) 70 - 99 (mg/dL)    BUN 7  6 - 23 (mg/dL)    Creatinine, Ser 0.70  0.50 - 1.10 (mg/dL)    Calcium 7.7 (*) 8.4 - 10.5 (mg/dL)    GFR calc non Af Amer 76 (*) >90 (mL/min)    GFR calc Af Amer 88 (*) >90 (mL/min)   CBC     Status: Abnormal   Collection Time   02/10/11  3:07 AM      Component Value Range Comment   WBC 7.9  4.0 - 10.5 (K/uL)    RBC 2.87 (*) 3.87 - 5.11 (MIL/uL)    Hemoglobin 9.2 (*) 12.0 - 15.0 (g/dL)    HCT 28.5 (*) 36.0 - 46.0 (%)    MCV 99.3  78.0 - 100.0 (fL)    MCH 32.1  26.0 - 34.0 (pg)    MCHC 32.3  30.0 - 36.0 (g/dL)    RDW 13.2  11.5 - 15.5 (%)  Platelets 170  150 - 400 (K/uL)   PHOSPHORUS     Status: Abnormal   Collection Time   02/10/11  3:07 AM      Component Value Range Comment   Phosphorus 1.9 (*) 2.3 - 4.6 (mg/dL)   CBC     Status: Abnormal   Collection Time   02/11/11  5:30 AM      Component Value Range Comment   WBC 7.2  4.0 - 10.5 (K/uL)    RBC 2.80 (*) 3.87 - 5.11 (MIL/uL)    Hemoglobin 9.0 (*) 12.0 - 15.0 (g/dL)    HCT 27.8 (*) 36.0 - 46.0 (%)    MCV 99.3  78.0 - 100.0 (fL)    MCH 32.1  26.0 - 34.0 (pg)    MCHC 32.4  30.0 - 36.0 (g/dL)    RDW 12.9  11.5 - 15.5 (%)    Platelets 183  150 - 400 (K/uL)   BASIC METABOLIC PANEL     Status: Abnormal   Collection Time   02/11/11  5:30 AM      Component Value Range Comment   Sodium 136  135 - 145 (mEq/L)    Potassium 4.2  3.5 - 5.1 (mEq/L)    Chloride 103  96 - 112 (mEq/L)    CO2 29  19 - 32 (mEq/L)    Glucose, Bld 97  70 - 99 (mg/dL)    BUN 5 (*) 6 - 23 (mg/dL)    Creatinine, Ser 0.70  0.50 - 1.10 (mg/dL)    Calcium 7.9 (*) 8.4 -  10.5 (mg/dL)    GFR calc non Af Amer 76 (*) >90 (mL/min)    GFR calc Af Amer 88 (*) >90 (mL/min)   MAGNESIUM     Status: Normal   Collection Time   02/11/11  5:30 AM      Component Value Range Comment   Magnesium 2.0  1.5 - 2.5 (mg/dL)   PHOSPHORUS     Status: Normal   Collection Time   02/11/11  5:30 AM      Component Value Range Comment   Phosphorus 2.4  2.3 - 4.6 (mg/dL)     Imaging / Studies: @[id number] @  Antibiotics: Anti-infectives     Start     Dose/Rate Route Frequency Ordered Stop   02/08/11 0605   ertapenem (INVANZ) 1 g in sodium chloride 0.9 % 50 mL IVPB  Status:  Discontinued        1 g 100 mL/hr over 30 Minutes Intravenous Every 24 hours 02/08/11 0605 02/08/11 1426          Medications / Allergies: per chart  Assessment / Plan: Hannah Mercado  77 y.o. female  Procedure(s): LAPAROSCOPIC SIGMOID COLON RESECTION OVARIAN CYSTECTOMY  Problem List:  Principal Problem:  *Rectal cancer, at 7 cm, uT2N0cM0  -adv diet  -bowel regimen  -transition from IV to PO medicines Active Problems:  PULMONARY FIBROSIS, POSTINFLAMMATORY  GERD  Bradycardia -VTE prophylaxis- SCDs, etc -mobilize as tolerated to help recovery -f/u pathology  Morton Peters, M.D., F.A.C.S. Gastrointestinal and Minimally Invasive Surgery [facility] [address] [place], [place] [address] [phone] Main / Paging [phone] Voice Mail   02/11/2011

## 2011-02-12 MED ORDER — BISACODYL 5 MG PO TBEC
10.0000 mg | DELAYED_RELEASE_TABLET | Freq: Every day | ORAL | Status: DC
  Administered 2011-02-13: 10 mg via ORAL
  Filled 2011-02-12: qty 2

## 2011-02-12 MED ORDER — BISACODYL 10 MG RE SUPP: 10.0000 mg | Freq: Every day | RECTAL | Status: DC | PRN

## 2011-02-12 MED ORDER — DOCUSATE SODIUM 100 MG PO CAPS
100.0000 mg | ORAL_CAPSULE | Freq: Two times a day (BID) | ORAL | Status: DC
  Administered 2011-02-12 – 2011-02-13 (×2): 100 mg via ORAL
  Filled 2011-02-12 (×3): qty 1

## 2011-02-12 NOTE — Progress Notes (Signed)
Pt looks great! Adv diet to bland diet Continue to mobilize more Possible D/C 1-2 days if continues good progress Pathology c/w Stage 1 disease - good cure rate for her

## 2011-02-12 NOTE — Progress Notes (Signed)
4 Days Post-Op  Subjective: She looks great.  Some flatus and just a small BM.  Moving and up in halls 3 times yesterday.  Pain is well controlled. Objective: Vital signs in last 24 hours: Temp:  [98.1 F (36.7 C)-98.5 F (36.9 C)] 98.4 F (36.9 C) (11/23 0543) Pulse Rate:  [64-71] 64  (11/23 0543) Resp:  [18] 18  (11/23 0543) BP: (148-161)/(64-84) 153/72 mmHg (11/23 0543) SpO2:  [97 %-100 %] 97 % (11/23 0543) Last BM Date: 02/10/11  Intake/Output from previous day: 11/22 0701 - 11/23 0700 In: 3 [I.V.:3] Out: 2651 [Urine:2650; Stool:1] Intake/Output this shift:    General appearance: alert, cooperative and no distress Resp: Bilateral basilar rales. GI: soft, non-tender; bowel sounds normal; no masses,  no organomegaly and Incisions looks good, new ONq and seems to be work well,   Lab Results:   Basename 02/11/11 0530 02/10/11 0307  WBC 7.2 7.9  HGB 9.0* 9.2*  HCT 27.8* 28.5*  PLT 183 170    BMET  Basename 02/11/11 0530 02/10/11 0307  NA 136 136  K 4.2 4.0  CL 103 105  CO2 29 28  GLUCOSE 97 110*  BUN 5* 7  CREATININE 0.70 0.70  CALCIUM 7.9* 7.7*   PT/INR No results found for this basename: LABPROT:2,INR:2 in the last 72 hours   Studies/Results: No results found.  Anti-infectives: Anti-infectives     Start     Dose/Rate Route Frequency Ordered Stop   02/08/11 0605   ertapenem [facility]) 1 g in sodium chloride 0.9 % 50 mL IVPB  Status:  Discontinued        1 g 100 mL/hr over 30 Minutes Intravenous Every 24 hours 02/08/11 0605 02/08/11 1426         Current Facility-Administered Medications  Medication Dose Route Frequency Provider Last Rate Last Dose  . acetaminophen (TYLENOL) tablet 325-650 mg  325-650 mg Oral Q6H PRN Adin Hector, MD      . albuterol (PROVENTIL HFA;VENTOLIN HFA) 108 (90 BASE) MCG/ACT inhaler 2 puff  2 puff Inhalation Q6H PRN Stark Klein, MD      . alum & mag hydroxide-simeth (MAALOX PLUS) 400-400-40 MG/5ML suspension 30 mL  30 mL  Oral Q6H PRN Adin Hector, MD      . alvimopan (ENTEREG) capsule 12 mg  12 mg Oral BID Stark Klein, MD   12 mg at 02/11/11 2149  . aspirin-acetaminophen-caffeine (EXCEDRIN MIGRAINE) per tablet 1 tablet  1 tablet Oral Q8H PRN Adin Hector, MD      . bupivacaine 0.25 % ON-Q pump DUAL CATH 300 mL  300 mL Other Continuous Adin Hector, MD   300 mL at 02/11/11 1511  . diphenhydrAMINE (BENADRYL) injection 12.5 mg  12.5 mg Intravenous Q6H PRN Stark Klein, MD       Or  . diphenhydrAMINE (BENADRYL) 12.5 MG/5ML elixir 12.5 mg  12.5 mg Oral Q6H PRN Stark Klein, MD      . enoxaparin (LOVENOX) injection 40 mg  40 mg Subcutaneous Q24H Stark Klein, MD   40 mg at 02/11/11 1836  . Flora-Q (FLORA-Q) Capsule 1 capsule  1 capsule Oral Daily Adin Hector, MD   1 capsule at 02/11/11 1308  . gabapentin (NEURONTIN) capsule 100 mg  100 mg Oral QHS PRN Stark Klein, MD      . HYDROcodone-acetaminophen (NORCO) 5-325 MG per tablet 1-2 tablet  1-2 tablet Oral Q4H PRN Adin Hector, MD      . ibuprofen (ADVIL,MOTRIN)  tablet 400-800 mg  400-800 mg Oral Q6H PRN Adin Hector, MD      . lactated ringers bolus 1,000 mL  1,000 mL Intravenous Q6H PRN Adin Hector, MD      . morphine 2 MG/ML injection 2-5 mg  2-5 mg Intravenous Q1H PRN Adin Hector, MD      . naloxone [facility]) injection 0.4 mg  0.4 mg Intravenous PRN Stark Klein, MD      . ondansetron (ZOFRAN) tablet 4 mg  4 mg Oral Q6H PRN Stark Klein, MD       Or  . ondansetron (ZOFRAN) injection 4 mg  4 mg Intravenous Q6H PRN Stark Klein, MD      . psyllium (HYDROCIL/METAMUCIL) packet 1 packet  1 packet Oral BID Adin Hector, MD   1 packet at 02/11/11 2200  . sodium chloride 0.9 % injection 3 mL  3 mL Intravenous Q12H Adin Hector, MD   3 mL at 02/12/11 0352  . sodium chloride 0.9 % injection 3 mL  3 mL Intravenous PRN Adin Hector, MD   3 mL at 02/11/11 2149  . DISCONTD: bupivacaine ON-Q pain pump   Other Continuous Stark Klein, MD      .  DISCONTD: dextrose 5 % and 0.45 % NaCl with KCl 20 mEq/L infusion   Intravenous Continuous Stark Klein, MD 50 mL/hr at 02/11/11 0723    . DISCONTD: morphine 1 MG/ML PCA injection   Intravenous Q4H Stark Klein, MD   5.23 mg at 02/11/11 1306  . DISCONTD: sodium chloride 0.9 % injection 9 mL  9 mL Intravenous PRN Stark Klein, MD       Facility-Administered Medications Ordered in Other Encounters  Medication Dose Route Frequency Provider Last Rate Last Dose  . ceFAZolin (ANCEF) IVPB 1 g/50 mL premix    PRN Erich Montane   1 g at 02/08/11 [id number]  . cisatracurium (NIMBEX) 2 MG/ML injection    PRN Erich Montane   4 mg at 02/08/11 1100  . dexamethasone (DECADRON) injection    PRN Erich Montane   10 mg at 02/08/11 0800  . ePHEDrine injection    PRN Erich Montane   5 mg at 02/08/11 0800  . ertapenem (INVANZ) 1 g in sodium chloride 0.9 % 50 mL IVPB  1 g  Continuous PRN Erich Montane   1 g at 02/08/11 0729  . fentaNYL (SUBLIMAZE) injection    PRN Erich Montane   50 mcg at 02/08/11 1030  . glycopyrrolate (ROBINUL) injection    PRN Clelia Schaumann Gray   0.2 mg at 02/08/11 1200  . HYDROmorphone (DILAUDID) injection    PRN Erich Montane   1 mg at 02/08/11 1230  . lidocaine (cardiac) 100 mg/1ml (XYLOCAINE) 20 MG/ML injection 2%    PRN Clelia Schaumann Gray   50 mg at 02/08/11 0730  . neostigmine (PROSTIGMINE) injection   Intravenous PRN Erich Montane   2 mg at 02/08/11 1130  . ondansetron (ZOFRAN) injection    PRN Erich Montane   1 mg at 02/08/11 0915  . propofol (DIPRIVAN) 10 MG/ML infusion    PRN Erich Montane   20 mg at 02/08/11 0815  . propofol (DIPRIVAN) 10 MG/ML infusion    Continuous PRN Clelia Schaumann Gray   120 mcg/kg/min at 02/08/11 1050  . DISCONTD: acetaminophen (OFIRMEV) IVPB    PRN Erich Montane   1,000 mg at 02/08/11 1115  .  DISCONTD: lactated ringers infusion    Continuous PRN Erich Montane      . DISCONTD: lactated ringers infusion     Continuous PRN Erich Montane        Assessment/Plan POD 4 Lap, L oophrectomy, Low Anterior colon resection. Patient Active Problem List  Diagnoses  . HYPERCHOLESTEROLEMIA  . ANXIETY  . NEUROPATHY  . MITRAL VALVE PROLAPSE  . PULMONARY FIBROSIS, POSTINFLAMMATORY  . GERD  . HIATAL HERNIA  . DEGENERATIVE JOINT DISEASE  . OSTEOPENIA  . ALLERGY  . Occult blood in stools  . pT2pN0 (0/12 lymph nodes) cM0  . Ovarian cyst, left  . Bradycardia  Plan:  Continue to mobilize, ask OT &PT to help, She will go home with daughter for a time, but lives alone after that.  Increase IS q1h while awake.  Continue full  Liquids for now.  Soft later today or in AM.   LOS: 4 days    Eoin Willden 02/12/2011

## 2011-02-12 NOTE — Progress Notes (Signed)
Physical Therapy Evaluation 1x eval only, no other PT needs at this time, thanks Patient Details Name: Hannah Mercado MRN: [id number] DOB: Aug 19, 1922 Today's Date: 02/12/2011 11:15=11:47 EV1 Problem List:  Patient Active Problem List  Diagnoses  . HYPERCHOLESTEROLEMIA  . ANXIETY  . NEUROPATHY  . MITRAL VALVE PROLAPSE  . PULMONARY FIBROSIS, POSTINFLAMMATORY  . GERD  . HIATAL HERNIA  . DEGENERATIVE JOINT DISEASE  . OSTEOPENIA  . ALLERGY  . Occult blood in stools  . pT2pN0 (0/12 lymph nodes) cM0  . Ovarian cyst, left  . Bradycardia    Past Medical History:  Past Medical History  Diagnosis Date  . Allergy history unknown   . History of pneumonia     right middle lobe  . Pulmonary fibrosis, postinflammatory     pt denies at this visit   . Mitral valve prolapse     pt denies at this visit   . Hypercholesteremia   . Hiatal hernia   . GERD (gastroesophageal reflux disease)   . DJD (degenerative joint disease)   . Osteopenia   . Anxiety   . Asthmatic bronchitis     use inhaler prn   . Blood transfusion     ? with ectopic pregnancy 50 yrs ago   . Cancer     rectal cancer    Past Surgical History:  Past Surgical History  Procedure Date  . Nasal sinus surgery 1994  . Hammer toe surgery 1995    Dr. Silverio Decamp  . Appendectomy   . Colonoscopy 11/27/10    rectal cancer, small sigmoid adenoma, diverticulosis  . Vaginal hysterectomy 1980's  . Ectopic pregnancy surgery     Midline incision  . Laparoscopic left oophorectomy, laparoscopic low anterior resection, placement of onq pain pump 02/08/2011  . Colon resection 02/08/2011    Procedure: LAPAROSCOPIC SIGMOID COLON RESECTION;  Surgeon: Stark Klein, MD;  Location: [facility];  Service: General;  Laterality: N/A;  Laparoscopic Lower Anterior Bowel Resection  . Ovarian cyst removal 02/08/2011    Procedure: OVARIAN CYSTECTOMY;  Surgeon: Stark Klein, MD;  Location: [facility];  Service: General;  Laterality: Left;  Removal of  Left Ovary and Pelvic Mass    PT Assessment/Plan/Recommendation PT Assessment Clinical Impression Statement: Pt doing very well.  Ambulating with family in halls ways, they did want me to assess how ell she is doing and very smart about how to progress herself and steady, just holding on to person for extra suppport for extra safety.  Pt and family agrees no futher PT needs needed at this time.  They will continue to ambulate n hallway several times a daya dn she will have family staying with her the first few days as well.  PT Recommendation/Assessment: Patent does not need any further PT services PT Goals     PT Evaluation Precautions/Restrictions  Restrictions Weight Bearing Restrictions: No Prior Functioning  Home Living Lives With: Alone (family in town to asssit and a lot of others to help also) Type of Home: Other (Comment) (town home 2 story) Home Layout: Two level (with an Media planner) Home Access: Stairs to enter (one only) Prior Function Level of Independence: Independent with basic ADLs;Independent with gait (very independent and driving, active with events and golf) Driving: Yes Vocation: Other (comment) Vocation Requirements: helps at church Leisure: Hobbies-yes (Comment) Comments:  (golfing!! ) Cognition Cognition Arousal/Alertness: Awake/alert Orientation Level: Oriented X4 Sensation/Coordination   Extremity Assessment   Mobility (including Balance) Bed Mobility Bed Mobility: Yes Transfers Transfers: Yes Sit to  Stand: 5: Supervision Ambulation/Gait Ambulation/Gait: Yes Ambulation/Gait Assistance: 4: Min assist (hand held assist to steady, did take some Indep steps) Ambulation Distance (Feet): 250 Feet Assistive device: 1 person hand held assist Gait Pattern: Step-through pattern    Exercise    End of Session PT - End of Session Activity Tolerance: Patient tolerated treatment well Patient left: in chair General Behavior During Session: [facility] for tasks  performed  Clide Dales 02/12/2011, 12:48 PM Clide Dales, PT Pager: [phone] 02/12/2011

## 2011-02-13 MED ORDER — HYDROCODONE-ACETAMINOPHEN 5-325 MG PO TABS: 1.0000 | ORAL_TABLET | Freq: Four times a day (QID) | ORAL | Status: AC | PRN

## 2011-02-13 NOTE — Discharge Summary (Signed)
Admit  04/09/2010  D/C 04/14/2010   PCP - [facility] [place]  1. LAPAROSCOPIC SIGMOID COLON RESECTION OVARIAN CYSTECTOMY - 04/09/10    (pT2pN0 (0/12 lymph nodes) cM0))   I could not find final path in computer ????   2. PULMONARY FIBROSIS, POSTINFLAMMATORY  3. GERD (04/19/2007) 4. History of Bradycardia.   DC summary # [id number] 02/13/2011

## 2011-02-13 NOTE — Progress Notes (Signed)
Gave pt discharge instructions and prescription. Pt. Verbalized understanding of instructions given. Pt left in no acute distress via wheelchair.

## 2011-02-13 NOTE — Progress Notes (Signed)
POD# 5  Assessment/Plan:   1. LAPAROSCOPIC SIGMOID COLON RESECTION OVARIAN CYSTECTOMY - 04/09/10 (pT2pN0 (0/12 lymph nodes) cM0))  Has done well and is ready for discharge.  Instructions reviewed with patient.   2.  PULMONARY FIBROSIS, POSTINFLAMMATORY   3.  GERD (04/19/2007)     4.  History of Bradycardia     LOS: 5 days   Subjective:  Doing well.  Minimal discomfort.  Had BM x 2.  Spoke to Dr. Barry Dienes yesterday on phone.  Objective:   Filed Vitals:   02/13/11 0613  BP: 113/73  Pulse: 77  Temp: 98.7 F (37.1 C)  Resp: 20     Intake/Output from previous day:  11/23 0701 - 11/24 0700 In: -  Out: 2300 [Urine:2300]  Intake/Output this shift:      Physical Exam:   General: WN WF who is alert and generally healthy appearing.    HEENT: Normal. Pupils equal. Good dentition. .   Lungs: Clear   Abdomen: Soft.  OnQ removed.  BS present   Wound: Wicks removed.  And redressed wound.   Neurologic:  Grossly intact to motor and sensory function.   Psychiatric: Has normal mood and affect. Behavior is normal   Lab Results:    Basename 02/11/11 0530  WBC 7.2  HGB 9.0*  HCT 27.8*  PLT 183    BMET   Basename 02/11/11 0530  NA 136  K 4.2  CL 103  CO2 29  GLUCOSE 97  BUN 5*  CREATININE 0.70  CALCIUM 7.9*    PT/INR  No results found for this basename: LABPROT:2,INR:2 in the last 72 hours  ABG  No results found for this basename: PHART:2,PCO2:2,PO2:2,HCO3:2 in the last 72 hours   Studies/Results:  No results found.   Anti-infectives:   Anti-infectives     Start     Dose/Rate Route Frequency Ordered Stop   02/08/11 0605   ertapenem [facility]) 1 g in sodium chloride 0.9 % 50 mL IVPB  Status:  Discontinued        1 g 100 mL/hr over 30 Minutes Intravenous Every 24 hours 02/08/11 0605 02/08/11 1426           Alphonsa Overall, MD, [place] Pager: [phone],   [place] [facility] Office: [phone] 02/13/2011

## 2011-02-13 NOTE — Discharge Summary (Addendum)
NAMEFARREN, EDGELL NO.:  [id number]  MEDICAL RECORD NO.:  [id number]  LOCATION:  [id number]                         FACILITY:  [facility]  PHYSICIAN:  [place] Malling. Lucia Gaskins, M.D.  DATE OF BIRTH:  01/16/1923  DATE OF ADMISSION:  02/08/2011 DATE OF DISCHARGE:  02/13/2011                              DISCHARGE SUMMARY  DISCHARGE DIAGNOSES: 1. Adenocarcinoma of the rectum, T2 N0.  (Final path pending.  Or I could not find the results in Epic, which is possible. 2. Left ovarian cyst. 3. History of pulmonary fibrosis. 4. Gastroesophageal reflux disease.  OPERATION PERFORMED:  Patient underwent a laparoscopic-assisted low anterior resection and left ovarian cystectomy by Dr. Barry Dienes and Dr. Ulanda Edison on April 09, 2010.  HISTORY OF PRESENT ILLNESS:  This is an 77 year old white female, who was found to have a rectal mass, approximately 7 cm from the anal verge, which biopsies were positive for cancer.  An ultrasound by Dr. Ardis Hughs suggests this be a T2 tumor and the CAT scan was negative for metastatic disease.  She had a pelvic mass suspicious for ovarian mass.  Discussion carried out with Dr. Ulanda Edison who assisted Dr. Barry Dienes with the procedure.  PAST MEDICAL HISTORY:  Significant that she has history of; 1. Pulmonary fibrosis. 2. She has a mitral valve prolapse. 3. She has hypercholesterolemia. 4. Gastroesophageal reflux disease. 5. Degenerative joint disease.  HOSPITAL COURSE:  On the date of admission, she was taken to the operating room, underwent a laparoscopic-assisted low anterior resection with resection of the left ovarian mass by Drs. Byerly and [place].  She had an On-Q pump placed, and postoperatively she did well.  Her activity was increased.  Her diet increased.  She is now 5 days postop. Her abdominal incision looks good and her wicks have been removed. She had an On-Q pump for pain control, which was removed today after discharge.  I cannot find a final pathology in  the computer at this time.  So, that is pending, and her discharge instructions were reviewed.  DISCHARGE INSTRUCTIONS:   She can shower.   She should not drive for 5-7 days.   She is to see Dr. Barry Dienes back in about 2 weeks for followup.   She was given Vicodin for pain.  She can resume her home medications.  DISCHARGE CONDITION:  Good.   [place] Malling. Lucia Gaskins, M.D., FACS    DHN/MEDQ  D:  02/13/2011  T:  02/13/2011  Job:  [id number]  cc:   Deborra Medina. Lenna Gilford, [place] [address]  Carl E. Gessner, MD,FACG [place], [place] [address]  Lucille Passy. Ulanda Edison, M.D. Fax: [phone]

## 2011-03-03 ENCOUNTER — Encounter (INDEPENDENT_AMBULATORY_CARE_PROVIDER_SITE_OTHER): Payer: Medicare Other | Admitting: General Surgery

## 2011-03-04 ENCOUNTER — Encounter (INDEPENDENT_AMBULATORY_CARE_PROVIDER_SITE_OTHER): Payer: Self-pay | Admitting: General Surgery

## 2011-03-04 ENCOUNTER — Ambulatory Visit (INDEPENDENT_AMBULATORY_CARE_PROVIDER_SITE_OTHER): Payer: Medicare Other | Admitting: General Surgery

## 2011-03-04 DIAGNOSIS — C20 Malignant neoplasm of rectum: Secondary | ICD-10-CM

## 2011-03-04 NOTE — Assessment & Plan Note (Signed)
Doing well May drive in another 1-2 weeks. OK to leave dressings off. OK for diet as tolerated. Follow up in 4-6 weeks.   Will discuss whether we will do 1 year colonoscopy and CEA or not.

## 2011-03-04 NOTE — Progress Notes (Signed)
HISTORY: Pt doing well.  Is still taking 1 pain pill at night, otherwise none during the day.  She is eating better.  She still does not have a huge appetite, but is eating and drinking enough.  She is staying on her own now, and her neighbors have been helping with food and housework.     PERTINENT REVIEW OF SYSTEMS: Otherwise negative times 12.   EXAM: Head: Normocephalic and atraumatic.  Eyes:  Conjunctivae are normal. Pupils are equal, round, and reactive to light. No scleral icterus.  Neck:  Normal range of motion. Neck supple. No tracheal deviation present. No thyromegaly present.  Resp: No respiratory distress, normal effort. Abd:  Abdomen is soft, non distended and non tender. Incision is well healed.  There is a pucker at the top of her incision.  There are no signs of infection.   There is no rebound and no guarding.  Neurological: Alert and oriented to person, place, and time. Just a bit unsteady.   Skin: Skin is warm and dry. No rash noted. No diaphoretic. No erythema. No pallor.  Psychiatric: Normal mood and affect. Normal behavior. Judgment and thought content normal.     ASSESSMENT AND PLAN:   pT2pN0 (0/12 lymph nodes) cM0 Doing well May drive in another 1-2 weeks. OK to leave dressings off. OK for diet as tolerated. Follow up in 4-6 weeks.   Will discuss whether we will do 1 year colonoscopy and CEA or not.      Milus Height, MD Surgical Oncology, [facility]  Noralee Space, MD, MD Noralee Space, MD

## 2011-03-04 NOTE — Patient Instructions (Signed)
No driving for another 1-2 weeks.  When you start, do short distances first with someone else in the car with you.  Can liberalize diet.

## 2011-04-02 ENCOUNTER — Ambulatory Visit (INDEPENDENT_AMBULATORY_CARE_PROVIDER_SITE_OTHER): Payer: Medicare Other | Admitting: General Surgery

## 2011-04-02 ENCOUNTER — Encounter (INDEPENDENT_AMBULATORY_CARE_PROVIDER_SITE_OTHER): Payer: Self-pay | Admitting: General Surgery

## 2011-04-02 VITALS — BP 180/86 | HR 77 | Temp 98.1°F | Ht 67.5 in | Wt 131.4 lb

## 2011-04-02 DIAGNOSIS — C20 Malignant neoplasm of rectum: Secondary | ICD-10-CM

## 2011-04-02 NOTE — Progress Notes (Signed)
HISTORY: Pt doing well.  No fevers/ chills.  Has had a few episodes of diarrhea.  Appetite still not ideal.  Energy slowly improving.  Now 8 weeks after LAR   PERTINENT REVIEW OF SYSTEMS: Otherwise negative.     EXAM: Head: Normocephalic and atraumatic.  Eyes:  Conjunctivae are normal. Pupils are equal, round, and reactive to light. No scleral icterus.  Resp: No respiratory distress, normal effort. Abd:  Abdomen is soft, non distended and non tender. Incisions well healed.   Neurological: Alert and oriented to person, place, and time. Coordination normal.  Skin: Skin is warm and dry. No rash noted. No diaphoretic. No erythema. No pallor.  Psychiatric: Normal mood and affect. Normal behavior. Judgment and thought content normal.     ASSESSMENT AND PLAN:   pT2pN0 (0/12 lymph nodes) cM0 Rectal Cancer Increase protein intake. Continue watching bowel function. Follow up with me in 5 months.         Milus Height, MD Surgical Oncology, [facility]  Noralee Space, MD, MD Noralee Space, MD

## 2011-04-02 NOTE — Assessment & Plan Note (Signed)
Increase protein intake. Continue watching bowel function. Follow up with me in 5 months.

## 2011-04-02 NOTE — Patient Instructions (Signed)
Avoid high fiber foods all at once.

## 2011-04-14 ENCOUNTER — Ambulatory Visit (INDEPENDENT_AMBULATORY_CARE_PROVIDER_SITE_OTHER): Payer: Medicare Other | Admitting: Pulmonary Disease

## 2011-04-14 ENCOUNTER — Other Ambulatory Visit (INDEPENDENT_AMBULATORY_CARE_PROVIDER_SITE_OTHER): Payer: Medicare Other

## 2011-04-14 ENCOUNTER — Encounter: Payer: Self-pay | Admitting: Pulmonary Disease

## 2011-04-14 DIAGNOSIS — I10 Essential (primary) hypertension: Secondary | ICD-10-CM

## 2011-04-14 DIAGNOSIS — M899 Disorder of bone, unspecified: Secondary | ICD-10-CM

## 2011-04-14 DIAGNOSIS — C20 Malignant neoplasm of rectum: Secondary | ICD-10-CM

## 2011-04-14 DIAGNOSIS — I059 Rheumatic mitral valve disease, unspecified: Secondary | ICD-10-CM

## 2011-04-14 DIAGNOSIS — M949 Disorder of cartilage, unspecified: Secondary | ICD-10-CM

## 2011-04-14 DIAGNOSIS — D649 Anemia, unspecified: Secondary | ICD-10-CM | POA: Diagnosis not present

## 2011-04-14 DIAGNOSIS — M1990 Unspecified osteoarthritis, unspecified site: Secondary | ICD-10-CM

## 2011-04-14 DIAGNOSIS — E7800 Pure hypercholesterolemia, unspecified: Secondary | ICD-10-CM

## 2011-04-14 DIAGNOSIS — K219 Gastro-esophageal reflux disease without esophagitis: Secondary | ICD-10-CM

## 2011-04-14 DIAGNOSIS — G589 Mononeuropathy, unspecified: Secondary | ICD-10-CM

## 2011-04-14 DIAGNOSIS — J8410 Pulmonary fibrosis, unspecified: Secondary | ICD-10-CM

## 2011-04-14 DIAGNOSIS — F411 Generalized anxiety disorder: Secondary | ICD-10-CM

## 2011-04-14 HISTORY — DX: Essential (primary) hypertension: I10

## 2011-04-14 LAB — BASIC METABOLIC PANEL
Calcium: 9.6 mg/dL (ref 8.4–10.5)
GFR: 85.64 mL/min (ref >60.00)
Potassium: 4.7 mEq/L (ref 3.5–5.1)
Sodium: 141 mEq/L (ref 135–145)

## 2011-04-14 LAB — CBC WITH DIFFERENTIAL/PLATELET
Basophils Absolute: 0 10*3/uL (ref 0.0–0.1)
Eosinophils Absolute: 0 10*3/uL (ref 0.0–0.7)
HCT: 37.2 % (ref 36.0–46.0)
Hemoglobin: 12.7 g/dL (ref 12.0–15.0)
Lymphs Abs: 1.6 10*3/uL (ref 0.7–4.0)
MCHC: 34.1 g/dL (ref 30.0–36.0)
MCV: 95.5 fl (ref 78.0–100.0)
Monocytes Absolute: 0.5 10*3/uL (ref 0.1–1.0)
Monocytes Relative: 7.1 % (ref 3.0–12.0)
Neutro Abs: 5.5 10*3/uL (ref 1.4–7.7)
RDW: 13.3 % (ref 11.5–14.6)

## 2011-04-14 LAB — HEPATIC FUNCTION PANEL
Alkaline Phosphatase: 77 U/L (ref 39–117)
Bilirubin, Direct: 0.1 mg/dL (ref 0.0–0.3)

## 2011-04-14 MED ORDER — AMLODIPINE BESYLATE 5 MG PO TABS: 5.0000 mg | ORAL_TABLET | Freq: Every day | ORAL | Status: DC

## 2011-04-14 MED ORDER — ATORVASTATIN CALCIUM 20 MG PO TABS: 20.0000 mg | ORAL_TABLET | Freq: Every day | ORAL | Status: DC

## 2011-04-14 NOTE — Patient Instructions (Signed)
Today we updated your med list in our EPIC system...    Continue your current medications the same...    We refilled your Lipitor per request...  Today we wrote a new prescription for NORVASC (Amlodipine) 5mg  tabs, one tab daily for BP...    Please monitor your BP at home & call for any questions...  For the diarrhea:    Start the ALIGN probiotic one daily...    Add the Activia yogurt daily...    You may use the OTC IMMODIUM every 4-6H as needed for the severe watery stuff!  Today we did your follow up blood work...    Please call the PHONE TREE in a few days for your results...    Dial [id number] & when prompted enter your patient number followed by the # symbol...    Your patient number is:  [id number]  Let's plan a follow up visit in 6-8 weeks.Marland KitchenMarland Kitchen

## 2011-04-14 NOTE — Progress Notes (Signed)
Subjective:    Patient ID: Hannah Mercado, female    DOB: 1923/01/07, 78 y.o.   MRN: [id number]  HPI 78 y/o WF here for a follow up visit... she has a hx of AB & is a never-smoker, uses PROAIR Prn... she has hyperlipidemia on LIPITOR, and a hx of GERD controlled on PRILOSEC... she has repeatedly refused routine colonoscopy despite efforts to get her to do so...  ~  May10:  she's had a good 18mo- still plays golf regularly... c/o neuropathy discomfort in LE's and friend w/ similar symptoms takes Gabapentin ==> tried it, no benefit. ~  Nov10:  she had a good 6 mo- went to [place] w/ her daughter... no new complaints or concerns... she has lost 15# on her diet down to 148# (eating less, appetite is pretty good, etc)... OK flu shot today. ~  [id number]:  another good 70mo- no new complaints or concerns, in good spirits & feeling well... notes it's too hot to golf, but exerc w/ walking etc... GYN= DrHenley & BMD is due... also due for TDAP today.  ~  April 01, 2010:  she had BMD in our office 7/11> osteopenia w/ TScores -2.3 in Lumbar Spine & -2.3 in right FemNeck> on Fosamax, Calcium, MVI, Vit D 1000 u daily...  she once again notes the Neuropathy symptoms in her legs- esp at night & wants to try the Gabapentin again, therefore we will start w/ 100mg  Qhs & titrate up to 300mg .  ~  October 09, 2010:  50mo ROV & she reports a good interval w/o new complaints or concerns> she does not like the hot, humid weather but is generally stable, requesting refill prescriptions, and due for yearly CXR & fasting blood work...  ~  April 14, 2011:  35mo ROV & post hosp check> When last seen 2/6 stool cards returned pos for blood & she was sent to [facility] & colonoscopy 9/12 revealed rectal adenocarcinoma 7cm from the anus; 11/12 she had laparoscopic assisted low anterior resection by DrByerly CCS & left ovarian cystectomy (benign)by DrHenley; final path = T2 N0 mod diff adenocarcinoma invading into but not through the  muscularis, no lymphatic invasion, 12 neg LNs, clear margins;  She lost wt & is down from 145# to 135# currently, appetite fair, eating well she says & gaining strength after her rehab stay, she has a great attitude!...    BP is elevated at 160/80 & we decided to add Norvasc 5mg /d to her regimen...    Currently she is c/o some diarrhea & rec to start Align, Activia, & ok to use Immodium prn...    She had some post op anemia w/ Hg down to 9.0 by disch, and this has improved to 12.7 now...          Problem List:   pt's cell # [id number]  ALLERGY (ICD-995.3) - uses OTC meds as needed.  ASTHMATIC BRONCHITIS, ACUTE (ICD-466.0) - uses PROAIR as needed, but doing well, breathing OK. ~  7/09:  AB exac Rx w/ Pred, Avelox, Mucinex, etc... symptoms resolved. ~  CXR: baseline film w/ mild cardiomeg, scarring, chr changes, HH seen, & NAD.Marland Kitchen. ~  f/u CXR 6/11 & 7/12>  no change...  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 exam showed bibasilar velcro rales about 1/3rd way up> no change.  MITRAL VALVE PROLAPSE (ICD-424.0) - on ASA 81mg /d... had 2DEcho 2/04 showing MVP, mild MR, normal  LVF... StressEcho w/ EF=60%... subseq Cardiolite 12/03 was normal w/o ischemia and EF=65%... denies CP, palpit, dizzy, syncope, edema, etc; she is gradually increasing activities post op...  HYPERCHOLESTEROLEMIA (ICD-272.0) - controlled on LIPITOR 20mg /d,  and tol well...  ~  labs 7/08 showed TChol 183, TG 111, HDL 65, LDL 96... Doing well, continue Lip20. ~  FLP 11/09 showed TChol 189, TG 70, HDL 71, LDL 104 ~  FLP 5/10 showed TChol 173, TG 53, HDL 75, LDL 87... Remains stable on Lip20. ~  FLP 6/11 showed TChol 183, TG 103, HDL 74, LDL 88 ~  FLP 7/12 showed TChol 182, TG 127, HDL 74, LDL 82... Continues stable on Lip20.  HIATAL HERNIA (ICD-553.3) & GERD (ICD-530.81) - she has a HH seen on CXR and CT Chest... takes OMEPRAZOLE 20mg /d and denies  heartburn, GERD, regurg, dysphagia, etc... pt never had EGD or Colonoscopy> she had repeatedly refused to sched a colonoscopy! ~  Q74mo ROVs>  she continues to deny abd pain, N/V, etc>  ADENOCARCINOMA of the RECTUM >> Dx 9/12 via colonoscopy for hematochezia & 11/12 underwent laparoscopic assisted low anterior resection by DrByerly CCS & left ovarian cystectomy (benign) by Samuel Jester; final path = T2 N0 mod diff adenocarcinoma invading into but not through the muscularis, no lymphatic invasion, 12 neg LNs, clear margins...  RECURRENT UTIs>  Routine urinalysis w/ WBC, Bact, Leukocyte+, etc... Treated w/ Cipro as required.  DEGENERATIVE JOINT DISEASE (ICD-715.90) - mild in fingers, no other complaints x "I can't make quick turns anymore"... she notes the Tonic Water Qhs works great for her nightime cramps, & on NEURONTIN 100mg - 2Qhs for ?neuropathy symptoms?  OSTEOPENIA (ICD-733.90) - BMDs here in 2003, 2005, 2007- showed osteopenia/ osteoporosis w/ TScores -1.9 to -2.5.Marland KitchenMarland Kitchen on FOSAMAX, Ca++, Vits... ~  labs 11/09 showed Vit D level = 28... rec to take OTC supplement 1000 u daily. ~  BMD here 7/11 showed TScores -2.3 in Lumbar Spine, and -2.3 in right FemNeck... continue Rx.  ANXIETY (ICD-300.00)  Health Maintenance - GYN= DrHenley & follow up [place] w/ neg exam she says and stool cards were neg by her report... last [facility] 6[facility]10 [facility] was neg... she refuses to consider a colonoscopy... OK 2010 flu vaccine 11/10... had PNEUMOVAX 2009... OK TDAP 6/11...   Past Surgical History  Procedure Date  . Nasal sinus surgery 1994  . Hammer toe surgery 1995    Dr. Silverio Decamp  . Appendectomy   . Colonoscopy 11/27/10    rectal cancer, small sigmoid adenoma, diverticulosis  . Vaginal hysterectomy 1980's  . Ectopic pregnancy surgery     Midline incision  . Laparoscopic left oophorectomy, laparoscopic low anterior resection, placement of onq pain pump 02/08/2011  . Colon resection 02/08/2011     Procedure: LAPAROSCOPIC SIGMOID COLON RESECTION;  Surgeon: Stark Klein, MD;  Location: [facility];  Service: General;  Laterality: N/A;  Laparoscopic Lower Anterior Bowel Resection  . Ovarian cyst removal 02/08/2011    Procedure: OVARIAN CYSTECTOMY;  Surgeon: Stark Klein, MD;  Location: [facility];  Service: General;  Laterality: Left;  Removal of Left Ovary and Pelvic Mass  . Colon surgery     Outpatient Encounter Prescriptions as of 04/14/2011  Medication Sig Dispense Refill  . albuterol (PROVENTIL HFA;VENTOLIN HFA) 108 (90 BASE) MCG/ACT inhaler Inhale 2 puffs into the lungs every 6 (six) hours as needed. Wheezing       . alendronate (FOSAMAX) 70 MG tablet Take 1 tablet (70 mg total) by mouth every 7 (seven) days. Take  with a full glass of water on an empty stomach.  4 tablet  11  . ascorbic acid (VITAMIN C) 250 MG CHEW Chew 250 mg by mouth daily.       Marland Kitchen aspirin 81 MG tablet Take 81 mg by mouth every morning.       Marland Kitchen atorvastatin (LIPITOR) 20 MG tablet Take 20 mg by mouth at bedtime.       . Calcium Carbonate-Vitamin D (CALCIUM-VITAMIN D) 500-200 MG-UNIT per tablet Take 2 tablets by mouth daily.       . Cholecalciferol (VITAMIN D3) 1000 UNITS CAPS Take 1 capsule by mouth daily.       Marland Kitchen gabapentin (NEURONTIN) 100 MG tablet Take 100 mg by mouth at bedtime as needed. Pain       . omeprazole (PRILOSEC) 20 MG capsule Take 20 mg by mouth daily.       . vitamin A 8000 UNIT capsule Take 8,000 Units by mouth daily.       . vitamin B-12 (CYANOCOBALAMIN) 250 MCG tablet Take 250 mcg by mouth daily.       . vitamin E (VITAMIN E) 200 UNIT capsule Take 200 Units by mouth daily.         Allergies  Allergen Reactions  . Sulfonamide Derivatives Nausea And Vomiting    Happened a long time ago, does not remember the other reaction she had.    Current Medications, Allergies, Past Medical History, Past Surgical History, Family History, and Social History were reviewed in [employer]  record.   Review of Systems         See HPI - all other systems neg except as noted...  The patient denies anorexia, fever, weight loss, weight gain, vision loss, decreased hearing, hoarseness, chest pain, syncope, dyspnea on exertion, peripheral edema, prolonged cough, headaches, hemoptysis, abdominal pain, melena, hematochezia, severe indigestion/heartburn, hematuria, incontinence, muscle weakness, suspicious skin lesions, transient blindness, difficulty walking, depression, unusual weight change, abnormal bleeding, enlarged lymph nodes, and angioedema.   Objective:   Physical Exam     WD, WN, 78 y/o WF in NAD... GENERAL:  Alert & oriented; pleasant & cooperative... HEENT:  [place]/AT, Glasses, EOM-wnl, PERRLA, EACs-clear, TMs-wnl, NOSE-clear, THROAT-clear & wnl. NECK:  Supple w/ fair ROM; no JVD; normal carotid impulses w/o bruits; no thyromegaly or nodules palpated; no lymphadenopathy. CHEST:  few bibasilar dry rales about 1/3rd way up, no wheezing, no rhonchi, no cough, etc... HEART:  Regular Rhythm; without murmurs/ rubs/ or gallops, no msc heard... ABDOMEN:  Surg scar healed, nontender to palp, normal bowel sounds; no organomegaly or masses detected. EXT:  no deformities, mild arthritic changes; no varicose veins/ +venous insuffic/ no edema. NEURO:  CN's intact; no focal neuro deficits... DERM:  No lesions noted; no rash etc...  RADIOLOGY DATA:  Reviewed in the EPIC EMR & discussed w/ the patient...  LABORATORY DATA:  Reviewed in the EPIC EMR & discussed w/ the patient...   Assessment & Plan:   RECTAL CANCER>  As above- she is post op 11/12 & had uneventful rehab; slowly improving w/ c/o diarrhea & we discussed Nicoletta Dress, & immodium if Delma Post; she will maintain f/u w/ DrByerly & DrGessner.   PULM>  AB, Pulm Fibrosis>  Overall stable on prn Proair; prev CXR reviewed & resp exam w/o changes, stable.  MVP>  She is essent asymptomatic w/o CP, palpit, SOB, edema, etc...  CHOL>   Stable on Lip20 + diet, continue same...  GI> HH, GERD>  Stable  on Prilosec, continue same...  DJD/ Osteopenia>  She remains on Fosamax, Calcium, MVI, Vit D, etc; she will be due for BMD next yr...  UTI>  Prev treated w/ Cipro empirically & currently asymptomatic...  Anxiety>  Stable & she does not require anxiolytic Rx...   Patient's Medications  New Prescriptions   No medications on file  Previous Medications   ALBUTEROL (PROVENTIL HFA;VENTOLIN HFA) 108 (90 BASE) MCG/ACT INHALER    Inhale 2 puffs into the lungs every 6 (six) hours as needed. Wheezing    ALENDRONATE (FOSAMAX) 70 MG TABLET    Take 1 tablet (70 mg total) by mouth every 7 (seven) days. Take with a full glass of water on an empty stomach.   ASCORBIC ACID (VITAMIN C) 250 MG CHEW    Chew 250 mg by mouth daily.    ASPIRIN 81 MG TABLET    Take 81 mg by mouth every morning.    CALCIUM CARBONATE-VITAMIN D (CALCIUM-VITAMIN D) 500-200 MG-UNIT PER TABLET    Take 2 tablets by mouth daily.    CHOLECALCIFEROL (VITAMIN D3) 1000 UNITS CAPS    Take 1 capsule by mouth daily.    GABAPENTIN (NEURONTIN) 100 MG TABLET    Take 100 mg by mouth at bedtime as needed. Pain    OMEPRAZOLE (PRILOSEC) 20 MG CAPSULE    Take 20 mg by mouth daily.    VITAMIN A 8000 UNIT CAPSULE    Take 8,000 Units by mouth daily.    VITAMIN B-12 (CYANOCOBALAMIN) 250 MCG TABLET    Take 250 mcg by mouth daily.    VITAMIN E (VITAMIN E) 200 UNIT CAPSULE    Take 200 Units by mouth daily.   Modified Medications   Modified Medication Previous Medication   AMLODIPINE (NORVASC) 5 MG TABLET amLODipine (NORVASC) 5 MG tablet      Take 1 tablet (5 mg total) by mouth daily.    Take 5 mg by mouth daily.   ATORVASTATIN (LIPITOR) 20 MG TABLET atorvastatin (LIPITOR) 20 MG tablet      Take 1 tablet (20 mg total) by mouth at bedtime.    Take 20 mg by mouth at bedtime.   Discontinued Medications   No medications on file

## 2011-04-24 ENCOUNTER — Encounter: Payer: Self-pay | Admitting: Pulmonary Disease

## 2011-04-28 ENCOUNTER — Telehealth: Payer: Self-pay | Admitting: Pulmonary Disease

## 2011-04-28 NOTE — Telephone Encounter (Signed)
Pt's BP readings had been elevated and at 04/14/11 Ov Norvasc 5mg  was added by SN. Pt has been checking BP readings at [facility]. 1/29 @11am   BP 102/82  P 91 2/2   @10am  BP 135/80  P 88 2/4   @11am  BP  98/62   P 89 2/6   @ 12pm BP 102/82 P 91  Pt would like to know if this is too low or should she continue on Norvasc and continue to monitor. Pls advise. Allergies  Allergen Reactions  . Sulfonamide Derivatives Nausea And Vomiting    Happened a long time ago, does not remember the other reaction she had.

## 2011-04-28 NOTE — Telephone Encounter (Signed)
Per SN---BP's are on the low side---rec to decrease the norvasc to 1/2 tablet daily.  Keep checking her BP's at home and keep a log.  thanks

## 2011-04-28 NOTE — Telephone Encounter (Signed)
Called spoke with patient, advised of SN's recs of decreasing her norvasc to 1/2 tab daily and to keep a log.  Pt will keep 3.7.13 appt with SN.  MAR updated.

## 2011-05-26 ENCOUNTER — Other Ambulatory Visit (INDEPENDENT_AMBULATORY_CARE_PROVIDER_SITE_OTHER): Payer: Self-pay | Admitting: General Surgery

## 2011-05-27 ENCOUNTER — Ambulatory Visit (INDEPENDENT_AMBULATORY_CARE_PROVIDER_SITE_OTHER): Payer: Medicare Other | Admitting: Pulmonary Disease

## 2011-05-27 ENCOUNTER — Encounter: Payer: Self-pay | Admitting: Pulmonary Disease

## 2011-05-27 DIAGNOSIS — I10 Essential (primary) hypertension: Secondary | ICD-10-CM

## 2011-05-27 DIAGNOSIS — M949 Disorder of cartilage, unspecified: Secondary | ICD-10-CM

## 2011-05-27 DIAGNOSIS — C20 Malignant neoplasm of rectum: Secondary | ICD-10-CM | POA: Diagnosis not present

## 2011-05-27 DIAGNOSIS — I059 Rheumatic mitral valve disease, unspecified: Secondary | ICD-10-CM

## 2011-05-27 DIAGNOSIS — J8410 Pulmonary fibrosis, unspecified: Secondary | ICD-10-CM

## 2011-05-27 DIAGNOSIS — E7800 Pure hypercholesterolemia, unspecified: Secondary | ICD-10-CM

## 2011-05-27 DIAGNOSIS — F411 Generalized anxiety disorder: Secondary | ICD-10-CM

## 2011-05-27 DIAGNOSIS — K219 Gastro-esophageal reflux disease without esophagitis: Secondary | ICD-10-CM

## 2011-05-27 DIAGNOSIS — K449 Diaphragmatic hernia without obstruction or gangrene: Secondary | ICD-10-CM

## 2011-05-27 DIAGNOSIS — M1990 Unspecified osteoarthritis, unspecified site: Secondary | ICD-10-CM

## 2011-05-27 NOTE — Progress Notes (Signed)
Subjective:    Patient ID: Hannah Mercado, female    DOB: 19-Jun-1922, 78 y.o.   MRN: [id number]  HPI 78 y/o WF here for a follow up visit...  ~  May10:  she's had a good 37mo- still plays golf regularly... c/o neuropathy discomfort in LE's and friend w/ similar symptoms takes Gabapentin ==> tried it, no benefit. ~  Nov10:  she had a good 6 mo- went to [place] w/ her daughter... no new complaints or concerns... she has lost 15# on her diet down to 148# (eating less, appetite is pretty good, etc)... OK flu shot today. ~  [id number]:  another good 25mo- no new complaints or concerns, in good spirits & feeling well... notes it's too hot to golf, but exerc w/ walking etc... GYN= Hannah Mercado & BMD is due... also due for TDAP today.  ~  April 01, 2010:  she had BMD in our office 7/11> osteopenia w/ TScores -2.3 in Lumbar Spine & -2.3 in right FemNeck> on Fosamax, Calcium, MVI, Vit D 1000 u daily...  she once again notes the Neuropathy symptoms in her legs- esp at night & wants to try the Gabapentin again, therefore we will start w/ 100mg  Qhs & titrate up to 300mg .  ~  October 09, 2010:  71mo ROV & she reports a good interval w/o new complaints or concerns> she does not like the hot, humid weather but is generally stable, requesting refill prescriptions, and due for yearly CXR & fasting blood work...  ~  April 14, 2011:  51mo ROV & post hosp check> When last seen 2/6 stool cards returned pos for blood & she was sent to [facility] & colonoscopy 9/12 revealed rectal adenocarcinoma 7cm from the anus; 11/12 she had laparoscopic assisted low anterior resection by Hannah Mercado CCS & left ovarian cystectomy (benign)by Hannah Mercado; final path = T2 N0 mod diff adenocarcinoma invading into but not through the muscularis, no lymphatic invasion, 12 neg LNs, clear margins;  She lost wt & is down from 145# to 135# currently, appetite fair, eating well she says & gaining strength after her rehab stay, she has a great attitude!...    BP is  elevated at 160/80 & we decided to add Norvasc 5mg /d to her regimen...    Currently she is c/o some diarrhea & rec to start Align, Activia, & ok to use Immodium prn...    She had some post op anemia w/ Hg down to 9.0 by disch, and this has improved to 12.7 now...  ~  March 7,2013:  6wk ROV & she continues to improve, gaining strength, & looks fabulous;  C/o BP variations but she only checks it periodically at the Pharm> numbers read from 98/62 to 180/70;  Hannah Mercado is 120-140/70-80 & she is asymptomatic;  We had cut her Amlodipine5mg  to 1/2 daily & looks good on this dose- continue same...    Hx AB, pulm fibrosis> no resp symptoms & breathing good, getting back into her activities, etc...    MVP> stable w/ msc & gr1/6 SEM; no CP, palpit, ch in SOB, edema, etc...    Chol> remains on Lip20 & we discussed f/u FLP on return...    HH, GERD> on Prilosec20mg /d + antireflux regimen in view of her HH etc...    Hx rectal cancer> still w/ loose stool but not diarrhea; on Align & Activia; she's avoiding lactose & gas is improved, wants nutrition consult- ok...    Other problems as noted.Marland KitchenMarland Kitchen  Problem List:   pt's cell # [id number]  ALLERGY (ICD-995.3) - uses OTC meds as needed.  ASTHMATIC BRONCHITIS, ACUTE (ICD-466.0) - uses PROAIR as needed, but doing well, breathing OK. ~  7/09:  AB exac Rx w/ Pred, Avelox, Mucinex, etc... symptoms resolved. ~  CXR: baseline film w/ mild cardiomeg, scarring, chr changes, HH seen, & NAD.Marland Kitchen. ~  f/u CXR 6/11 & 7/12>  no change...  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 exam showed bibasilar velcro rales about 1/3rd way up> no change.  HYPERTENSION >> on NORVASC 5mg - 1/2 tab daily... ~  1/13: BP= 160/80 & we decided to start Norvasc5mg /d... subeq cut down to 1/2 tab per phone message. ~  3/13: BP= 154/84 & repeat 124/74; she feels well 7 denies CP, palpit, SOB, edema, etc; continue  same...  MITRAL VALVE PROLAPSE (ICD-424.0) - on ASA 81mg /d... had 2DEcho 2/04 showing MVP, mild MR, normal LVF... StressEcho w/ EF=60%... subseq Cardiolite 12/03 was normal w/o ischemia and EF=65%... denies CP, palpit, dizzy, syncope, edema, etc; she is gradually increasing activities post op...  HYPERCHOLESTEROLEMIA (ICD-272.0) - controlled on LIPITOR 20mg /d,  and tol well...  ~  labs 7/08 showed TChol 183, TG 111, HDL 65, LDL 96... Doing well, continue Lip20. ~  FLP 11/09 showed TChol 189, TG 70, HDL 71, LDL 104 ~  FLP 5/10 showed TChol 173, TG 53, HDL 75, LDL 87... Remains stable on Lip20. ~  FLP 6/11 showed TChol 183, TG 103, HDL 74, LDL 88 ~  FLP 7/12 showed TChol 182, TG 127, HDL 74, LDL 82... Continues stable on Lip20.  HIATAL HERNIA (ICD-553.3) & GERD (ICD-530.81) - she has a HH seen on CXR and CT Chest... takes OMEPRAZOLE 20mg /d and denies heartburn, GERD, regurg, dysphagia, etc... pt never had EGD or Colonoscopy> she had repeatedly refused to sched a colonoscopy! ~  Q31mo ROVs>  she continues to deny abd pain, N/V, etc>  ADENOCARCINOMA of the RECTUM >> Dx 9/12 via colonoscopy for hematochezia & 11/12 underwent laparoscopic assisted low anterior resection by Hannah Mercado CCS & left ovarian cystectomy (benign) by Hannah Mercado; final path = T2 N0 mod diff adenocarcinoma invading into but not through the muscularis, no lymphatic invasion, 12 neg LNs, clear margins...  RECURRENT UTIs>  Routine urinalysis w/ WBC, Bact, Leukocyte+, etc... Treated w/ Cipro as required.  DEGENERATIVE JOINT DISEASE (ICD-715.90) - mild in fingers, no other complaints x "I can't make quick turns anymore"... she notes the Tonic Water Qhs works great for her nightime cramps, & on NEURONTIN 100mg - 2Qhs for ?neuropathy symptoms?  OSTEOPENIA (ICD-733.90) - BMDs here in 2003, 2005, 2007- showed osteopenia/ osteoporosis w/ TScores -1.9 to -2.5.Marland KitchenMarland Kitchen on FOSAMAX, Ca++, Vits... ~  labs 11/09 showed Vit D level = 28... rec to take OTC  supplement 1000 u daily. ~  BMD here 7/11 showed TScores -2.3 in Lumbar Spine, and -2.3 in right FemNeck... continue Rx.  ANXIETY (ICD-300.00)  Health Maintenance - GYN= Hannah Mercado & follow up [place] w/ neg exam she says and stool cards were neg by her report... last M[facility] 6/10 at [facility] was neg... she refuses to consider a colonoscopy... OK 2010 flu vaccine 11/10... had PNEUMOVAX 2009... OK TDAP 6/11...   Past Surgical History  Procedure Date  . Nasal sinus surgery 1994  . Hammer toe surgery 1995    Dr. Silverio Decamp  . Appendectomy   . Colonoscopy 11/27/10    rectal cancer, small  sigmoid adenoma, diverticulosis  . Vaginal hysterectomy 1980's  . Ectopic pregnancy surgery     Midline incision  . Laparoscopic left oophorectomy, laparoscopic low anterior resection, placement of onq pain pump 02/08/2011  . Colon resection 02/08/2011    Procedure: LAPAROSCOPIC SIGMOID COLON RESECTION;  Surgeon: Stark Klein, MD;  Location: [facility];  Service: General;  Laterality: N/A;  Laparoscopic Lower Anterior Bowel Resection  . Ovarian cyst removal 02/08/2011    Procedure: OVARIAN CYSTECTOMY;  Surgeon: Stark Klein, MD;  Location: [facility];  Service: General;  Laterality: Left;  Removal of Left Ovary and Pelvic Mass  . Colon surgery     Outpatient Encounter Prescriptions as of 05/27/2011  Medication Sig Dispense Refill  . albuterol (PROVENTIL HFA;VENTOLIN HFA) 108 (90 BASE) MCG/ACT inhaler Inhale 2 puffs into the lungs every 6 (six) hours as needed. Wheezing       . alendronate (FOSAMAX) 70 MG tablet Take 1 tablet (70 mg total) by mouth every 7 (seven) days. Take with a full glass of water on an empty stomach.  4 tablet  11  . amLODipine (NORVASC) 5 MG tablet Take 2.5 mg by mouth daily.      Marland Kitchen ascorbic acid (VITAMIN C) 250 MG CHEW Chew 250 mg by mouth daily.       Marland Kitchen aspirin 81 MG tablet Take 81 mg by mouth every morning.       Marland Kitchen atorvastatin (LIPITOR) 20 MG tablet Take 1 tablet (20 mg total) by mouth  at bedtime.  30 tablet  11  . Calcium Carbonate-Vitamin D (CALCIUM-VITAMIN D) 500-200 MG-UNIT per tablet Take 2 tablets by mouth daily.       . Cholecalciferol (VITAMIN D3) 1000 UNITS CAPS Take 1 capsule by mouth daily.       . ferrous sulfate 325 (65 FE) MG tablet Take 325 mg by mouth daily with breakfast.      . gabapentin (NEURONTIN) 100 MG tablet Take 100 mg by mouth at bedtime as needed. Pain       . omeprazole (PRILOSEC) 20 MG capsule Take 20 mg by mouth daily.       . vitamin B-12 (CYANOCOBALAMIN) 250 MCG tablet Take 250 mcg by mouth daily.       . vitamin E (VITAMIN E) 200 UNIT capsule Take 200 Units by mouth daily.       Marland Kitchen DISCONTD: vitamin A 8000 UNIT capsule Take 8,000 Units by mouth daily.         Allergies  Allergen Reactions  . Sulfonamide Derivatives Nausea And Vomiting    Happened a long time ago, does not remember the other reaction she had.    Current Medications, Allergies, Past Medical History, Past Surgical History, Family History, and Social History were reviewed in [employer] record.   Review of Systems         See HPI - all other systems neg except as noted...  The patient denies anorexia, fever, weight loss, weight gain, vision loss, decreased hearing, hoarseness, chest pain, syncope, dyspnea on exertion, peripheral edema, prolonged cough, headaches, hemoptysis, abdominal pain, melena, hematochezia, severe indigestion/heartburn, hematuria, incontinence, muscle weakness, suspicious skin lesions, transient blindness, difficulty walking, depression, unusual weight change, abnormal bleeding, enlarged lymph nodes, and angioedema.   Objective:   Physical Exam     WD, WN, 78 y/o WF in NAD... GENERAL:  Alert & oriented; pleasant & cooperative... HEENT:  [place]/AT, Glasses, EOM-wnl, PERRLA, EACs-clear, TMs-wnl, NOSE-clear, THROAT-clear & wnl. NECK:  Supple w/ fair  ROM; no JVD; normal carotid impulses w/o bruits; no thyromegaly or nodules palpated;  no lymphadenopathy. CHEST:  few bibasilar dry rales about 1/3rd way up, no wheezing, no rhonchi, no cough, etc... HEART:  Regular Rhythm; without murmurs/ rubs/ or gallops, no msc heard... ABDOMEN:  Surg scar healed, nontender to palp, normal bowel sounds; no organomegaly or masses detected. EXT:  no deformities, mild arthritic changes; no varicose veins/ +venous insuffic/ no edema. NEURO:  CN's intact; no focal neuro deficits... DERM:  No lesions noted; no rash etc...  RADIOLOGY DATA:  Reviewed in the EPIC EMR & discussed w/ the patient...  LABORATORY DATA:  Reviewed in the EPIC EMR & discussed w/ the patient...   Assessment & Plan:   RECTAL CANCER>  As above- she is post op 11/12 & had uneventful rehab; slowly improving and diarrhea is diminished w/ Align/ Activia; she will maintain f/u w/ Hannah Mercado & DrGessner.   PULM>  AB, Pulm Fibrosis>  Overall stable on prn Proair; prev CXR reviewed & resp exam w/o changes, stable.  HBP>  BP improved w/ low dose Amlodipine 2.5mg /d, continue same...  MVP>  She is essent asymptomatic w/o CP, palpit, SOB, edema, etc...  CHOL>  Stable on Lip20 + diet, continue same...  GI> HH, GERD>  Stable on Prilosec, continue same...  DJD/ Osteopenia>  She remains on Fosamax, Calcium, MVI, Vit D, etc; she will be due for BMD next yr...  UTI>  Prev treated w/ Cipro empirically & currently asymptomatic...  Anxiety>  Stable & she does not require anxiolytic Rx...   Patient's Medications  New Prescriptions   No medications on file  Previous Medications   ALBUTEROL (PROVENTIL HFA;VENTOLIN HFA) 108 (90 BASE) MCG/ACT INHALER    Inhale 2 puffs into the lungs every 6 (six) hours as needed. Wheezing    ALENDRONATE (FOSAMAX) 70 MG TABLET    Take 1 tablet (70 mg total) by mouth every 7 (seven) days. Take with a full glass of water on an empty stomach.   AMLODIPINE (NORVASC) 5 MG TABLET    Take 2.5 mg by mouth daily.   ASCORBIC ACID (VITAMIN C) 250 MG CHEW    Chew  250 mg by mouth daily.    ASPIRIN 81 MG TABLET    Take 81 mg by mouth every morning.    ATORVASTATIN (LIPITOR) 20 MG TABLET    Take 1 tablet (20 mg total) by mouth at bedtime.   CALCIUM CARBONATE-VITAMIN D (CALCIUM-VITAMIN D) 500-200 MG-UNIT PER TABLET    Take 2 tablets by mouth daily.    CHOLECALCIFEROL (VITAMIN D3) 1000 UNITS CAPS    Take 1 capsule by mouth daily.    FERROUS SULFATE 325 (65 FE) MG TABLET    Take 325 mg by mouth daily with breakfast.   GABAPENTIN (NEURONTIN) 100 MG TABLET    Take 100 mg by mouth at bedtime as needed. Pain    OMEPRAZOLE (PRILOSEC) 20 MG CAPSULE    Take 20 mg by mouth daily.    VITAMIN B-12 (CYANOCOBALAMIN) 250 MCG TABLET    Take 250 mcg by mouth daily.    VITAMIN E (VITAMIN E) 200 UNIT CAPSULE    Take 200 Units by mouth daily.   Modified Medications   No medications on file  Discontinued Medications   VITAMIN A 8000 UNIT CAPSULE    Take 8,000 Units by mouth daily.

## 2011-05-27 NOTE — Patient Instructions (Signed)
Today we updated your med list in our EPIC system...    Continue your current medications the same...  Continue the [employer], etc...    Try to avoid the dairy products & lactose...  We will arrange for a Nutrition consult at your request...  Call for any questions...  Let's plan a follow up appt in about 3 months.Marland KitchenMarland Kitchen

## 2011-07-01 ENCOUNTER — Encounter: Payer: Self-pay | Admitting: Dietician

## 2011-07-01 ENCOUNTER — Encounter: Payer: Medicare Other | Attending: Pulmonary Disease | Admitting: Dietician

## 2011-07-01 VITALS — Ht 67.75 in | Wt 132.1 lb

## 2011-07-01 DIAGNOSIS — C20 Malignant neoplasm of rectum: Secondary | ICD-10-CM | POA: Diagnosis not present

## 2011-07-01 DIAGNOSIS — K219 Gastro-esophageal reflux disease without esophagitis: Secondary | ICD-10-CM | POA: Insufficient documentation

## 2011-07-01 DIAGNOSIS — R636 Underweight: Secondary | ICD-10-CM | POA: Insufficient documentation

## 2011-07-01 DIAGNOSIS — Z713 Dietary counseling and surveillance: Secondary | ICD-10-CM | POA: Insufficient documentation

## 2011-07-01 DIAGNOSIS — D649 Anemia, unspecified: Secondary | ICD-10-CM | POA: Diagnosis not present

## 2011-07-01 NOTE — Progress Notes (Signed)
Medical Nutrition Therapy:  Appt start time: 1630 end time:  1730.  Assessment:  Primary concerns today: Try to learn what to eat and to gain some weight.  Had colo-rectal/ovarian surgery on 02/08/2011.  She notes she lost approximately 12 inches of her colon along with whatever was taken to remove the rectal mass.  Her pre-op weight was 160 lb and she has since lost approximately 30 lbs. For a time was on liquid or soft foods.  When she started solid foods, she started to experience problems with diarrhea.  She has tried to work with the symptoms and to return to a regular diet.  She is avoiding raw and spicy foods especially.  She finds she does not toleraate cow's milk, but does tolerate soy milk at this time.  She finds that sweets (cake, cookies) tend to cause diarrhea.  She has found using Imodium does help.  The frequency of the diarrhea stools is decreasing, but she does have to work hard to plan meals/food intake in relation to her errands and appointments.  She has found it stressful to be out and suddenly have diarrhea that will soil her clothing before she can get to a bathroom.  She has adopted the approach to eating and giving herself 2-3 hours before dressing and leaving the house.  This appears to work and feels safer at this time. She was told she had some polyps present in the surgical specimen and was advised to avoid seeds and nuts.  MEDICATIONS: completed review of medications.  DIETARY INTAKE:   24-hr recall:  B (tends to not have breakfast AM): Hannah Mercado have an omlet and juice with her pills and a couple cups of coffee, some days  Snk (mid AM) :none  L (12:30  PM): ham sandwich and cup of tea, fruit (oranges, bananas, no apples)  Snk (mid  PM): 1/2 cup of Just Greens D (4:30-5:00 PM): chicken breast boiled about 4-5 oz, mac and cheese or baked potato with butter, or filet with green beans and corn. Salmon, Liptons dish that has noodles, broccoli, cream sauce. Eat a banana daily,  cheese with crackers. Using all grain and whole wheat breads.Using Special K cereal. Avoiding pop corn, seeds. Snk ( PM): cereal with soy milk Beverages: coffee, soy milk, orange juice, hot tea  Recent physical activity: Currently, not active. During recovery has had a marked decrease in energy level.  Prior to surgery, played golf on a regular basis.  Notes she is looking forward to when she can get back out to the golf course.  Estimated energy needs:HT: 67.75 in  WT: 132.1lb  BMI: 20.3 k/m2 1800-2000 calories for weight gain 205-210 g carbohydrates 135-140 g protein 48-50 g fat  Progress Towards Goal(s):  In progress.   Nutritional Diagnosis:  NI-1.4 Inadequate energy intake As related to altered absorption and decreased appetite s/p colon surgery.  As evidenced by marked decrease in nutrient intake and 30 lb weight loss with a BMI of 20.3.Marland Kitchen    Intervention:  Nutrition Has been working to increase her nutrient intake and is seeking help.  She has most likely had a decrease in her metabolism as her appetite and intake are decreased.  On most days, she is not eating until after 10:30-11:00 AM and that is juice with her medications.  Lunch is at 12:00-12:30.  She is doing predominantly blander foods, most items are cooked.  She is doing some raw fruits and has not found them to be an issue.  She does however go for a long period of time (9:00 PM-12:00 Noon) without eating each day.  We will work to help her regain an appetite and to increase her appetite while trying to not stimulate the diarrhea.  Hopefully with time this will decrease as her gut heals.  Handouts given during visit include:  Yellow card with nutrient categories and list of those to measure and limit,  Generalized, personal guidelines for incorporating more foods into her diet.  Monitoring/Evaluation:  Dietary intake,  body weight follow-up in 8-12 weeks and call with problems or questions in the mean time.

## 2011-07-04 ENCOUNTER — Encounter: Payer: Self-pay | Admitting: Dietician

## 2011-07-04 NOTE — Patient Instructions (Signed)
   Try to have the omelet with cheese at least 2-3 times per week.  Try to eat something in the morning hours.  You go for over 12 hours without any nutrient intake.  Try a slice of the whole wheat bread with peanut butter or cheese for an early AM breakfast.  Try to do small frequent meals or large snacks throughout the day.  Try to decrease the volume/amout of food, especially when you plan to go out later.  Experiment with coffee and food together, you need at least a 2 hour window for digestion to become more complete and allow you to leave the house.  Continue to use the blander foods.  Add butter and salad dressing to your cooked asparagus, broccoli, other veggies.  A little fat can help slow transit time in the intestine, it has more calories.  Too much fat can cause diarrhea, but a little will probably help you with energy and weight gain.  Keep a diary that will include the foods you eat, approximate serving sizes, the time you eat and the effects it has on your intestinal tract.  Plan to see me back in about 8-12 weeks.  I want to follow-up with your weight..  It was a pleasure to work with you last week.  As you can see., I have to grab extra time to keep up.  Take Care.  Maggie Darik Massing.

## 2011-08-03 ENCOUNTER — Encounter: Payer: Self-pay | Admitting: Pulmonary Disease

## 2011-08-03 ENCOUNTER — Ambulatory Visit (INDEPENDENT_AMBULATORY_CARE_PROVIDER_SITE_OTHER): Payer: Medicare Other | Admitting: Pulmonary Disease

## 2011-08-03 VITALS — BP 150/68 | HR 64 | Temp 97.0°F | Ht 67.5 in | Wt 134.4 lb

## 2011-08-03 DIAGNOSIS — K449 Diaphragmatic hernia without obstruction or gangrene: Secondary | ICD-10-CM

## 2011-08-03 DIAGNOSIS — M899 Disorder of bone, unspecified: Secondary | ICD-10-CM

## 2011-08-03 DIAGNOSIS — I059 Rheumatic mitral valve disease, unspecified: Secondary | ICD-10-CM | POA: Diagnosis not present

## 2011-08-03 DIAGNOSIS — C20 Malignant neoplasm of rectum: Secondary | ICD-10-CM

## 2011-08-03 DIAGNOSIS — I10 Essential (primary) hypertension: Secondary | ICD-10-CM

## 2011-08-03 DIAGNOSIS — M949 Disorder of cartilage, unspecified: Secondary | ICD-10-CM

## 2011-08-03 DIAGNOSIS — J8410 Pulmonary fibrosis, unspecified: Secondary | ICD-10-CM | POA: Diagnosis not present

## 2011-08-03 DIAGNOSIS — M1990 Unspecified osteoarthritis, unspecified site: Secondary | ICD-10-CM

## 2011-08-03 DIAGNOSIS — E7800 Pure hypercholesterolemia, unspecified: Secondary | ICD-10-CM | POA: Diagnosis not present

## 2011-08-03 DIAGNOSIS — F411 Generalized anxiety disorder: Secondary | ICD-10-CM

## 2011-08-03 DIAGNOSIS — D649 Anemia, unspecified: Secondary | ICD-10-CM

## 2011-08-03 NOTE — Progress Notes (Signed)
Subjective:    Patient ID: Hannah Mercado, female    DOB: 22-Mar-1923, 78 y.o.   MRN: [id number]  HPI 77 y/o WF here for a follow up visit...  ~  April 01, 2010:  she had BMD in our office 7/11> osteopenia w/ TScores -2.3 in Lumbar Spine & -2.3 in right FemNeck> on Fosamax, Calcium, MVI, Vit D 1000 u daily...  she once again notes the Neuropathy symptoms in her legs- esp at night & wants to try the Gabapentin again, therefore we will start w/ 100mg  Qhs & titrate up to 300mg .  ~  October 09, 2010:  76mo ROV & she reports a good interval w/o new complaints or concerns> she does not like the hot, humid weather but is generally stable, requesting refill prescriptions, and due for yearly CXR & fasting blood work...  ~  April 14, 2011:  59mo ROV & post hosp check> When last seen 2of6 stool cards returned pos for blood & she was sent to [facility] & colonoscopy 9/12 revealed rectal adenocarcinoma 7cm from the anus; 11/12 she had laparoscopic assisted low anterior resection by DrByerly CCS & left ovarian cystectomy (benign) by Samuel Jester; final path = T2 N0 mod diff adenocarcinoma invading into but not through the muscularis, no lymphatic invasion, 12 neg LNs, clear margins;  She lost wt & is down from 145# to 135# currently, appetite fair, eating well she says & gaining strength after her rehab stay, she has a great attitude!...    BP is elevated at 160/80 & we decided to add Norvasc 5mg /d to her regimen...    Currently she is c/o some diarrhea & rec to start Align, Activia, & ok to use Immodium prn...    She had some post op anemia w/ Hg down to 9.0 by disch, and this has improved to 12.7 now...  ~  March 7,2013:  6wk ROV & she continues to improve, gaining strength, & looks fabulous;  C/o BP variations but she only checks it periodically at the Pharm> numbers read from 98/62 to 180/70;  [name] is 120-140/70-80 & she is asymptomatic;  We had cut her Amlodipine5mg  to 1/2 daily & looks good on this dose- continue  same...    Hx AB, pulm fibrosis> no resp symptoms & breathing good, getting back into her activities, etc...    MVP> stable w/ msc & gr1/6 SEM; no CP, palpit, ch in SOB, edema, etc...    Chol> remains on Lip20 & we discussed f/u FLP on return...    HH, GERD> on Prilosec20mg /d + antireflux regimen in view of her HH etc...    Hx rectal cancer> still w/ loose stool but not diarrhea; on Align & Activia; she's avoiding lactose & gas is improved, wants nutrition consult- ok (saw MaggieMay)...    Other problems as noted...  ~  Aug 03, 2011:  65mo ROV & overall Hannah Mercado is improved;  BP controlled on sm dose of Amlodipine;  Chol looks great on Lip20;  GI symptoms improved;  We reviewed prob list, meds, xrays, & labs>  see below>> LABS 5/13:  FLP- all parameters at goals on Lip20;  Chems- wnl;  CBC- wnl & Iron is ok;  TSH=3.28;  VitD=54...             Problem List:   pt's cell # [id number]  ALLERGY (ICD-995.3) - uses OTC meds as needed.  ASTHMATIC BRONCHITIS, ACUTE (ICD-466.0) - uses PROAIR as needed, but doing well, breathing OK. ~  7/09:  AB exac Rx w/ Pred, Avelox, Mucinex, etc... symptoms resolved. ~  CXR: baseline film w/ mild cardiomeg, scarring, chr changes, HH seen, & NAD.Marland Kitchen. ~  f/u CXR 6/11 & 7/12>  no change...  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 exam showed bibasilar velcro rales about 1/3rd way up> no change.  HYPERTENSION >> on NORVASC 5mg - 1/2 tab daily... ~  1/13: BP= 160/80 & we decided to start Norvasc5mg /d... subeq cut down to 1/2 tab per phone message. ~  3/13: BP= 154/84 & repeat 124/74; she feels well & denies CP, palpit, SOB, edema, etc; continue same... ~  5/13:  BP= 150/68 & she remains asymptomatic &in good spirits; ok to continue same for now, monitor BP at home.  MITRAL VALVE PROLAPSE (ICD-424.0) - on ASA 81mg /d... had 2DEcho 2/04 showing MVP, mild MR, normal LVF... StressEcho w/ EF=60%...  subseq Cardiolite 12/03 was normal w/o ischemia and EF=65%... denies CP, palpit, dizzy, syncope, edema, etc; she is gradually increasing activities post op...  HYPERCHOLESTEROLEMIA (ICD-272.0) - controlled on LIPITOR 20mg /d,  and tol well...  ~  labs 7/08 showed TChol 183, TG 111, HDL 65, LDL 96... Doing well, continue Lip20. ~  FLP 11/09 showed TChol 189, TG 70, HDL 71, LDL 104 ~  FLP 5/10 showed TChol 173, TG 53, HDL 75, LDL 87... Remains stable on Lip20. ~  FLP 6/11 showed TChol 183, TG 103, HDL 74, LDL 88 ~  FLP 7/12 showed TChol 182, TG 127, HDL 74, LDL 82... Continues stable on Lip20. ~  FLP 5/13 on Lip20 showed TChol 167, TG 79, HDL 82, LDL 69... Continue same.  HIATAL HERNIA (ICD-553.3) & GERD (ICD-530.81) - she has a HH seen on CXR and CT Chest... takes OMEPRAZOLE 20mg /d and denies heartburn, GERD, regurg, dysphagia, etc... pt never had EGD or Colonoscopy> she had repeatedly refused to sched a colonoscopy! ~  Q58mo ROVs>  she continues to deny reflux symptoms, abd pain, N/V, etc>  ADENOCARCINOMA of the RECTUM >> Dx 9/12 via colonoscopy for hematochezia & 11/12 underwent laparoscopic assisted low anterior resection by DrByerly CCS & left ovarian cystectomy (benign) by Samuel Jester; final path = T2 N0 mod diff adenocarcinoma invading into but not through the muscularis, no lymphatic invasion, 12 neg LNs, clear margins...  RECURRENT UTIs>  Routine urinalysis w/ WBC, Bact, Leukocyte+, etc... Treated w/ Cipro as required.  DEGENERATIVE JOINT DISEASE (ICD-715.90) - mild in fingers, no other complaints x "I can't make quick turns anymore"... she notes the Tonic Water Qhs works great for her nightime cramps, & on NEURONTIN 100mg - 2Qhs for ?neuropathy symptoms?  OSTEOPENIA (ICD-733.90) - BMDs here in 2003, 2005, 2007- showed osteopenia/ osteoporosis w/ TScores -1.9 to -2.5.Marland KitchenMarland Kitchen on FOSAMAX, Ca++, Vits... ~  labs 11/09 showed Vit D level = 28... rec to take OTC supplement 1000 u daily. ~  BMD here 7/11  showed TScores -2.3 in Lumbar Spine, and -2.3 in right FemNeck... continue Rx. ~  Labs 5/13 showed Vit D level = 54... rec to continue her 1000u supplement...  ANXIETY (ICD-300.00)  Health Maintenance - GYN= DrHenley & follow up Mona w/ neg exam she says and stool cards were neg by her report... last Mammogram 6/10 at [facility] was neg... she refuses to consider a colonoscopy... OK 2010 flu vaccine 11/10... had PNEUMOVAX 2009... OK TDAP 6/11...   Past Surgical History  Procedure Date  . Nasal sinus surgery 1994  . Hammer toe surgery  1995    Dr. Silverio Decamp  . Appendectomy   . Colonoscopy 11/27/10    rectal cancer, small sigmoid adenoma, diverticulosis  . Vaginal hysterectomy 1980's  . Ectopic pregnancy surgery     Midline incision  . Laparoscopic left oophorectomy, laparoscopic low anterior resection, placement of onq pain pump 02/08/2011  . Colon resection 02/08/2011    Procedure: LAPAROSCOPIC SIGMOID COLON RESECTION;  Surgeon: Stark Klein, MD;  Location: [facility];  Service: General;  Laterality: N/A;  Laparoscopic Lower Anterior Bowel Resection  . Ovarian cyst removal 02/08/2011    Procedure: OVARIAN CYSTECTOMY;  Surgeon: Stark Klein, MD;  Location: [facility];  Service: General;  Laterality: Left;  Removal of Left Ovary and Pelvic Mass  . Colon surgery     Outpatient Encounter Prescriptions as of 08/03/2011  Medication Sig Dispense Refill  . albuterol (PROVENTIL HFA;VENTOLIN HFA) 108 (90 BASE) MCG/ACT inhaler Inhale 2 puffs into the lungs every 6 (six) hours as needed. Wheezing       . alendronate (FOSAMAX) 70 MG tablet Take 1 tablet (70 mg total) by mouth every 7 (seven) days. Take with a full glass of water on an empty stomach.  4 tablet  11  . amLODipine (NORVASC) 5 MG tablet Take 2.5 mg by mouth daily.      Marland Kitchen ascorbic acid (VITAMIN C) 250 MG CHEW Chew 250 mg by mouth daily.       Marland Kitchen aspirin 81 MG tablet Take 81 mg by mouth every morning.       Marland Kitchen atorvastatin (LIPITOR) 20 MG  tablet Take 1 tablet (20 mg total) by mouth at bedtime.  30 tablet  11  . Calcium Carbonate-Vitamin D (CALCIUM-VITAMIN D) 500-200 MG-UNIT per tablet Take 2 tablets by mouth daily.       . Cholecalciferol (VITAMIN D3) 1000 UNITS CAPS Take 1 capsule by mouth daily.       . ferrous sulfate 325 (65 FE) MG tablet Take 325 mg by mouth daily with breakfast.      . loperamide (IMODIUM) 1 MG/5ML solution Take 2-4 mg by mouth 4 (four) times daily as needed.      Marland Kitchen omeprazole (PRILOSEC) 20 MG capsule Take 20 mg by mouth daily.       . vitamin B-12 (CYANOCOBALAMIN) 250 MCG tablet Take 250 mcg by mouth daily.       . vitamin E (VITAMIN E) 200 UNIT capsule Take 200 Units by mouth daily.       Marland Kitchen DISCONTD: gabapentin (NEURONTIN) 100 MG tablet Take 100 mg by mouth at bedtime as needed. Pain         Allergies  Allergen Reactions  . Sulfonamide Derivatives Nausea And Vomiting    Happened a long time ago, does not remember the other reaction she had.    Current Medications, Allergies, Past Medical History, Past Surgical History, Family History, and Social History were reviewed in [employer] record.   Review of Systems         See HPI - all other systems neg except as noted...  The patient denies anorexia, fever, weight loss, weight gain, vision loss, decreased hearing, hoarseness, chest pain, syncope, dyspnea on exertion, peripheral edema, prolonged cough, headaches, hemoptysis, abdominal pain, melena, hematochezia, severe indigestion/heartburn, hematuria, incontinence, muscle weakness, suspicious skin lesions, transient blindness, difficulty walking, depression, unusual weight change, abnormal bleeding, enlarged lymph nodes, and angioedema.   Objective:   Physical Exam     WD, WN, 78 y/o WF in NAD... GENERAL:  Alert & oriented; pleasant & cooperative... HEENT:  [place]/AT, Glasses, EOM-wnl, PERRLA, EACs-clear, TMs-wnl, NOSE-clear, THROAT-clear & wnl. NECK:  Supple w/ fair ROM; no  JVD; normal carotid impulses w/o bruits; no thyromegaly or nodules palpated; no lymphadenopathy. CHEST:  few bibasilar dry rales about 1/3rd way up, no wheezing, no rhonchi, no cough, etc... HEART:  Regular Rhythm; without murmurs/ rubs/ or gallops, no msc heard... ABDOMEN:  Surg scar healed, nontender to palp, normal bowel sounds; no organomegaly or masses detected. EXT:  no deformities, mild arthritic changes; no varicose veins/ +venous insuffic/ no edema. NEURO:  CN's intact; no focal neuro deficits... DERM:  No lesions noted; no rash etc...  RADIOLOGY DATA:  Reviewed in the EPIC EMR & discussed w/ the patient...  LABORATORY DATA:  Reviewed in the EPIC EMR & discussed w/ the patient...   Assessment & Plan:   PULM>  AB, Pulm Fibrosis>  Overall stable on prn Proair; prev CXR reviewed & resp exam w/o changes, stable.  HBP>  BP improved w/ low dose Amlodipine 2.5mg /d, continue same & monitor at home...  MVP>  She is essent asymptomatic w/o CP, palpit, SOB, edema, etc...  CHOL>  Stable on Lip20 + diet, continue same...  GI> HH, GERD>  Stable on Prilosec, continue same...  RECTAL CANCER>  As above- she is post op 11/12 & had uneventful rehab; slowly improving and diarrhea is diminished w/ Align/ Activia; she will maintain f/u w/ DrByerly & DrGessner.  DJD/ Osteopenia>  She remains on Fosamax, Calcium, MVI, Vit D, etc; she will be due for BMD next yr...  UTI>  Prev treated w/ Cipro empirically & currently asymptomatic...  Anxiety>  Stable & she does not require anxiolytic Rx...   Patient's Medications  New Prescriptions   No medications on file  Previous Medications   ALBUTEROL (PROVENTIL HFA;VENTOLIN HFA) 108 (90 BASE) MCG/ACT INHALER    Inhale 2 puffs into the lungs every 6 (six) hours as needed. Wheezing    ALENDRONATE (FOSAMAX) 70 MG TABLET    Take 1 tablet (70 mg total) by mouth every 7 (seven) days. Take with a full glass of water on an empty stomach.   AMLODIPINE (NORVASC)  5 MG TABLET    Take 2.5 mg by mouth daily.   ASCORBIC ACID (VITAMIN C) 250 MG CHEW    Chew 250 mg by mouth daily.    ASPIRIN 81 MG TABLET    Take 81 mg by mouth every morning.    ATORVASTATIN (LIPITOR) 20 MG TABLET    Take 1 tablet (20 mg total) by mouth at bedtime.   CALCIUM CARBONATE-VITAMIN D (CALCIUM-VITAMIN D) 500-200 MG-UNIT PER TABLET    Take 2 tablets by mouth daily.    CHOLECALCIFEROL (VITAMIN D3) 1000 UNITS CAPS    Take 1 capsule by mouth daily.    FERROUS SULFATE 325 (65 FE) MG TABLET    Take 325 mg by mouth daily with breakfast.   LOPERAMIDE (IMODIUM) 1 MG/5ML SOLUTION    Take 2-4 mg by mouth 4 (four) times daily as needed.   OMEPRAZOLE (PRILOSEC) 20 MG CAPSULE    Take 20 mg by mouth daily.    VITAMIN B-12 (CYANOCOBALAMIN) 250 MCG TABLET    Take 250 mcg by mouth daily.    VITAMIN E (VITAMIN E) 200 UNIT CAPSULE    Take 200 Units by mouth daily.   Modified Medications   No medications on file  Discontinued Medications   GABAPENTIN (NEURONTIN) 100 MG TABLET    Take  100 mg by mouth at bedtime as needed. Pain

## 2011-08-03 NOTE — Patient Instructions (Signed)
Today we updated your med list in our EPIC system...    Continue your current medications the same...  Please return to our lab one morning this week for your FASTING blood work...    We will call you w/ the results when avail...  Call for any questions...  Let's plan a follow up visit in 6 months, sooner if needed for problems.Marland KitchenMarland Kitchen

## 2011-08-04 ENCOUNTER — Other Ambulatory Visit (INDEPENDENT_AMBULATORY_CARE_PROVIDER_SITE_OTHER): Payer: Medicare Other

## 2011-08-04 DIAGNOSIS — D649 Anemia, unspecified: Secondary | ICD-10-CM

## 2011-08-04 DIAGNOSIS — I10 Essential (primary) hypertension: Secondary | ICD-10-CM | POA: Diagnosis not present

## 2011-08-04 DIAGNOSIS — M899 Disorder of bone, unspecified: Secondary | ICD-10-CM

## 2011-08-04 DIAGNOSIS — E7800 Pure hypercholesterolemia, unspecified: Secondary | ICD-10-CM | POA: Diagnosis not present

## 2011-08-04 DIAGNOSIS — M949 Disorder of cartilage, unspecified: Secondary | ICD-10-CM | POA: Diagnosis not present

## 2011-08-04 DIAGNOSIS — F411 Generalized anxiety disorder: Secondary | ICD-10-CM

## 2011-08-04 LAB — BASIC METABOLIC PANEL
Calcium: 9 mg/dL (ref 8.4–10.5)
Creatinine, Ser: 0.8 mg/dL (ref 0.4–1.2)
GFR: 72.15 mL/min (ref >60.00)
Sodium: 144 mEq/L (ref 135–145)

## 2011-08-04 LAB — HEPATIC FUNCTION PANEL
Alkaline Phosphatase: 49 U/L (ref 39–117)
Bilirubin, Direct: 0 mg/dL (ref 0.0–0.3)
Total Bilirubin: 0.5 mg/dL (ref 0.3–1.2)
Total Protein: 7.6 g/dL (ref 6.0–8.3)

## 2011-08-04 LAB — CBC WITH DIFFERENTIAL/PLATELET
Basophils Absolute: 0 10*3/uL (ref 0.0–0.1)
Basophils Relative: 0.4 % (ref 0.0–3.0)
Eosinophils Absolute: 0.2 10*3/uL (ref 0.0–0.7)
HCT: 38.8 % (ref 36.0–46.0)
Hemoglobin: 12.8 g/dL (ref 12.0–15.0)
Lymphocytes Relative: 25.3 % (ref 12.0–46.0)
Lymphs Abs: 1.5 10*3/uL (ref 0.7–4.0)
MCHC: 32.9 g/dL (ref 30.0–36.0)
Neutro Abs: 3.8 10*3/uL (ref 1.4–7.7)
RBC: 3.96 Mil/uL (ref 3.87–5.11)
RDW: 14.1 % (ref 11.5–14.6)

## 2011-08-04 LAB — IBC PANEL
Iron: 83 ug/dL (ref 42–145)
Transferrin: 232.8 mg/dL (ref 212.0–360.0)

## 2011-08-04 LAB — LIPID PANEL
Cholesterol: 167 mg/dL (ref 0–200)
LDL Cholesterol: 69 mg/dL (ref 0–99)
VLDL: 15.8 mg/dL (ref 0.0–40.0)

## 2011-08-27 ENCOUNTER — Ambulatory Visit: Payer: Medicare Other | Admitting: Pulmonary Disease

## 2011-09-03 ENCOUNTER — Ambulatory Visit (INDEPENDENT_AMBULATORY_CARE_PROVIDER_SITE_OTHER): Payer: Medicare Other | Admitting: General Surgery

## 2011-09-03 ENCOUNTER — Encounter (INDEPENDENT_AMBULATORY_CARE_PROVIDER_SITE_OTHER): Payer: Self-pay | Admitting: General Surgery

## 2011-09-03 VITALS — BP 132/68 | HR 60 | Temp 97.4°F | Resp 14 | Ht 67.0 in | Wt 130.2 lb

## 2011-09-03 DIAGNOSIS — C20 Malignant neoplasm of rectum: Secondary | ICD-10-CM | POA: Diagnosis not present

## 2011-09-03 NOTE — Patient Instructions (Signed)
Try one imodium tablet at bedtime every other day or so.  Colonoscopy in October/november  Follow up in 6 months.

## 2011-09-07 DIAGNOSIS — Z1231 Encounter for screening mammogram for malignant neoplasm of breast: Secondary | ICD-10-CM | POA: Diagnosis not present

## 2011-09-09 DIAGNOSIS — R928 Other abnormal and inconclusive findings on diagnostic imaging of breast: Secondary | ICD-10-CM | POA: Diagnosis not present

## 2011-09-15 NOTE — Assessment & Plan Note (Signed)
Pt's bowel function issues sound almost like she is having constipation and then diarrhea around the stools.   I cannot feel a stricture at the staple line, but this is also a possibility. I would definitely like her to get her 1 year colonoscopy this fall even though she was considering not doing it due to age.   She is showing improvement on her own taking the probiotics, so I think we can hold off until the fall.    I will see her back in 6 months.

## 2011-09-15 NOTE — Progress Notes (Signed)
HISTORY: Pt doing well overall.  Energy level has come back to near normal.  She is, however, having some loose stools.  She occasionally is constipated, however.  She does not have nausea or bloating.  She denies abdominal pain.  She just sometimes has urgency and frequency associated with diarrhea.   PERTINENT REVIEW OF SYSTEMS: Otherwise negative.     EXAM: Head: Normocephalic and atraumatic.  Eyes:  Conjunctivae are normal. Pupils are equal, round, and reactive to light. No scleral icterus.  Neck:  Normal range of motion. Neck supple. No tracheal deviation present. No thyromegaly present.  Resp: No respiratory distress, normal effort. Abd:  Abdomen is soft, non distended and non tender. No masses are palpable.  There is no rebound and no guarding.  Rectal:  No stricture or mass at staple line.   Neurological: Alert and oriented to person, place, and time. Coordination normal.  Skin: Skin is warm and dry. No rash noted. No diaphoretic. No erythema. No pallor.  Psychiatric: Normal mood and affect. Normal behavior. Judgment and thought content normal.      ASSESSMENT AND PLAN:   pT2pN0 (0/12 lymph nodes) cM0 Rectal Cancer Pt's bowel function issues sound almost like she is having constipation and then diarrhea around the stools.   I cannot feel a stricture at the staple line, but this is also a possibility. I would definitely like her to get her 1 year colonoscopy this fall even though she was considering not doing it due to age.   She is showing improvement on her own taking the probiotics, so I think we can hold off until the fall.    I will see her back in 6 months.        Milus Height, MD Surgical Oncology, [facility]  Noralee Space, MD Noralee Space, MD

## 2011-09-17 ENCOUNTER — Encounter: Payer: Self-pay | Admitting: Pulmonary Disease

## 2011-10-11 ENCOUNTER — Other Ambulatory Visit: Payer: Self-pay | Admitting: Pulmonary Disease

## 2012-01-26 ENCOUNTER — Ambulatory Visit (INDEPENDENT_AMBULATORY_CARE_PROVIDER_SITE_OTHER): Payer: Medicare Other | Admitting: Internal Medicine

## 2012-01-26 ENCOUNTER — Encounter: Payer: Self-pay | Admitting: Internal Medicine

## 2012-01-26 VITALS — BP 144/74 | HR 68 | Ht 67.0 in | Wt 134.4 lb

## 2012-01-26 DIAGNOSIS — R197 Diarrhea, unspecified: Secondary | ICD-10-CM

## 2012-01-26 DIAGNOSIS — Z85048 Personal history of other malignant neoplasm of rectum, rectosigmoid junction, and anus: Secondary | ICD-10-CM

## 2012-01-26 MED ORDER — NA SULFATE-K SULFATE-MG SULF 17.5-3.13-1.6 GM/177ML PO SOLN: ORAL | Status: DC

## 2012-01-26 NOTE — Patient Instructions (Addendum)
You have been scheduled for a colonoscopy with propofol. Please follow written instructions given to you at your visit today.  Please pick up your prep kit at the pharmacy within the next 1-3 days. If you use inhalers (even only as needed) or a CPAP machine, please bring them with you on the day of your procedure.  Thank you for choosing me and [facility].  Gatha Mayer, M.D., [facility]

## 2012-01-26 NOTE — Progress Notes (Signed)
  Subjective:    Patient ID: Hannah Mercado, female    DOB: 1923/02/25, 78 y.o.   MRN: [id number]  HPI This is a delightful very elderly white woman with a history of rectal cancer discovered by me at colonoscopy in 2012 and resected last year, by Dr. Barry Dienes. She has had postoperative diarrhea problems and she gradually improved. She is using some intermittent Imodium with success but still has episodes of fecal incontinence that our surprise. Overall she feels like his much better and she is in general happy with her quality of life. She denies bleeding or unintentional weight loss. She's had fatigue which is improved over time and she has not yet return to golfing and that sort of thing but in general feels like things are okay.  Medications, allergies, past medical history, past surgical history, family history and social history are reviewed and updated in the EMR.  Review of Systems Blood pressure medicine started in the interim.    Objective:   Physical Exam General:  NAD Eyes:   anicteric Lungs:  clear except for one focal area of an inspiratory dry crackles in the right mid lung posterior Heart:  S1S2 no rubs, murmurs or gallops Abdomen:  soft and nontender, BS+ Ext:   no edema    Data Reviewed:  Review his colonoscopy, pathology reports, operative reports. CT scans. June 2013 surgery note       Assessment & Plan:   1. Diarrhea   2. Personal history of rectal cancer T2 NOMO    Overall she is doing well but does have the diarrhea issue. She does not think she's impacted. Could have a stricture at the anastomosis though Dr. Barry Dienes did not feel one with her finger. Dr. Barry Dienes indicates she thinks a colonoscopy would be useful in this setting, we'll schedule a followup screening surveillance colonoscopy with her history of rectal cancer in the diarrhea symptoms. Patient is aware of the risks benefits and indications and willing to proceed.

## 2012-02-03 ENCOUNTER — Ambulatory Visit (INDEPENDENT_AMBULATORY_CARE_PROVIDER_SITE_OTHER)
Admission: RE | Admit: 2012-02-03 | Discharge: 2012-02-03 | Disposition: A | Discharge status: Home / Self Care | Payer: Medicare Other | Source: Ambulatory Visit | Attending: Pulmonary Disease | Admitting: Pulmonary Disease

## 2012-02-03 ENCOUNTER — Other Ambulatory Visit (INDEPENDENT_AMBULATORY_CARE_PROVIDER_SITE_OTHER): Payer: Medicare Other

## 2012-02-03 ENCOUNTER — Ambulatory Visit (INDEPENDENT_AMBULATORY_CARE_PROVIDER_SITE_OTHER): Payer: Medicare Other | Admitting: Pulmonary Disease

## 2012-02-03 ENCOUNTER — Encounter: Payer: Self-pay | Admitting: Pulmonary Disease

## 2012-02-03 VITALS — BP 122/70 | HR 60 | Temp 96.8°F | Ht 67.5 in | Wt 133.4 lb

## 2012-02-03 DIAGNOSIS — M949 Disorder of cartilage, unspecified: Secondary | ICD-10-CM

## 2012-02-03 DIAGNOSIS — M1990 Unspecified osteoarthritis, unspecified site: Secondary | ICD-10-CM

## 2012-02-03 DIAGNOSIS — K449 Diaphragmatic hernia without obstruction or gangrene: Secondary | ICD-10-CM | POA: Diagnosis not present

## 2012-02-03 DIAGNOSIS — J8410 Pulmonary fibrosis, unspecified: Secondary | ICD-10-CM

## 2012-02-03 DIAGNOSIS — E7800 Pure hypercholesterolemia, unspecified: Secondary | ICD-10-CM | POA: Diagnosis not present

## 2012-02-03 DIAGNOSIS — I10 Essential (primary) hypertension: Secondary | ICD-10-CM

## 2012-02-03 DIAGNOSIS — J438 Other emphysema: Secondary | ICD-10-CM | POA: Diagnosis not present

## 2012-02-03 DIAGNOSIS — I059 Rheumatic mitral valve disease, unspecified: Secondary | ICD-10-CM | POA: Diagnosis not present

## 2012-02-03 DIAGNOSIS — N390 Urinary tract infection, site not specified: Secondary | ICD-10-CM

## 2012-02-03 DIAGNOSIS — K219 Gastro-esophageal reflux disease without esophagitis: Secondary | ICD-10-CM

## 2012-02-03 DIAGNOSIS — F411 Generalized anxiety disorder: Secondary | ICD-10-CM

## 2012-02-03 DIAGNOSIS — M899 Disorder of bone, unspecified: Secondary | ICD-10-CM

## 2012-02-03 DIAGNOSIS — C20 Malignant neoplasm of rectum: Secondary | ICD-10-CM

## 2012-02-03 LAB — CBC WITH DIFFERENTIAL/PLATELET
Basophils Relative: 0.5 % (ref 0.0–3.0)
Eosinophils Relative: 3.5 % (ref 0.0–5.0)
HCT: 38.3 % (ref 36.0–46.0)
Hemoglobin: 12.6 g/dL (ref 12.0–15.0)
Lymphs Abs: 1.2 10*3/uL (ref 0.7–4.0)
MCV: 98.6 fl (ref 78.0–100.0)
Monocytes Absolute: 0.5 10*3/uL (ref 0.1–1.0)
Neutro Abs: 3.1 10*3/uL (ref 1.4–7.7)
Platelets: 217 10*3/uL (ref 150.0–400.0)
RBC: 3.89 Mil/uL (ref 3.87–5.11)
WBC: 5 10*3/uL (ref 4.5–10.5)

## 2012-02-03 LAB — URINALYSIS, ROUTINE W REFLEX MICROSCOPIC
Bilirubin Urine: NEGATIVE
Hgb urine dipstick: NEGATIVE
Urine Glucose: NEGATIVE
Urobilinogen, UA: 0.2 (ref 0.0–1.0)

## 2012-02-03 LAB — BASIC METABOLIC PANEL
BUN: 20 mg/dL (ref 6–23)
Chloride: 108 mEq/L (ref 96–112)
Potassium: 4.5 mEq/L (ref 3.5–5.1)
Sodium: 143 mEq/L (ref 135–145)

## 2012-02-03 NOTE — Patient Instructions (Addendum)
Today we updated your med list in our EPIC system...    Continue your current medications the same...  Today we did your follow up blood work & CXR...    We will contact you w/ the results when avail...  Call for any questions...  Let's plan a follow up visit in 6 months.Marland KitchenMarland Kitchen

## 2012-02-03 NOTE — Progress Notes (Signed)
Subjective:    Patient ID: Hannah Mercado, female    DOB: [birth year], 78 y.o.   MRN: [id number]  HPI 78 y/o WF here for a follow up visit...  ~  April 01, 2010:  she had BMD in our office 7/11> osteopenia w/ TScores -2.3 in Lumbar Spine & -2.3 in right FemNeck> on Fosamax, Calcium, MVI, Vit D 1000 u daily...  she once again notes the Neuropathy symptoms in her legs- esp at night & wants to try the Gabapentin again, therefore we will start w/ 100mg  Qhs & titrate up to 300mg .  ~  October 09, 2010:  58mo ROV & she reports a good interval w/o new complaints or concerns> she does not like the hot, humid weather but is generally stable, requesting refill prescriptions, and due for yearly CXR & fasting blood work...  ~  April 14, 2011:  26mo ROV & post hosp check> When last seen 2of6 stool cards returned pos for blood & she was sent to [facility] & colonoscopy 9/12 revealed rectal adenocarcinoma 7cm from the anus; 11/12 she had laparoscopic assisted low anterior resection by DrByerly CCS & left ovarian cystectomy (benign) by Samuel Jester; final path = T2 N0 mod diff adenocarcinoma invading into but not through the muscularis, no lymphatic invasion, 12 neg LNs, clear margins;  She lost wt & is down from 145# to 135# currently, appetite fair, eating well she says & gaining strength after her rehab stay, she has a great attitude!...    BP is elevated at 160/80 & we decided to add Norvasc 5mg /d to her regimen...    Currently she is c/o some diarrhea & rec to start Align, Activia, & ok to use Immodium prn...    She had some post op anemia w/ Hg down to 9.0 by disch, and this has improved to 12.7 now...  ~  March 7,2013:  6wk ROV & she continues to improve, gaining strength, & looks fabulous;  C/o BP variations but she only checks it periodically at the Pharm> numbers read from 98/62 to 180/70;  [name] is 120-140/70-80 & she is asymptomatic;  We had cut her Amlodipine5mg  to 1/2 daily & looks good on this dose- continue  same...    Hx AB, pulm fibrosis> no resp symptoms & breathing good, getting back into her activities, etc...    MVP> stable w/ msc & gr1/6 SEM; no CP, palpit, ch in SOB, edema, etc...    Chol> remains on Lip20 & we discussed f/u FLP on return...    HH, GERD> on Prilosec20mg /d + antireflux regimen in view of her HH etc...    Hx rectal cancer> still w/ loose stool but not diarrhea; on Align & Activia; she's avoiding lactose & gas is improved, wants nutrition consult- ok (saw MaggieMay)...    Other problems as noted...  ~  Aug 03, 2011:  11mo ROV & overall Hannah Mercado is improved;  BP controlled on sm dose of Amlodipine;  Chol looks great on Lip20;  GI symptoms improved;  We reviewed prob list, meds, xrays, & labs>  see below>> LABS 5/13:  FLP- all parameters at goals on Lip20;  Chems- wnl;  CBC- wnl & Iron is ok;  TSH=3.28;  VitD=54...  ~  February 03, 2012:  22mo ROV & Hannah Mercado has been doing satis- she has f/u colonoscopy planned for 02/14/12...    Hx AB, pulm fibrosis> on ProventilHFA prn; no resp symptoms & breathing good, & she denies cough, phlegm, SOB, etc; f/u CXR w/o acute  changes...    MVP, HBP> on ASA81 & Amlod2.5; BP= 122/70 & 130s/70s at home; stable w/ msc & gr1/6 SEM; no CP, palpit, ch in SOB, edema, etc...    Chol> remains on Lip20 & FLP 5/13 looked great...    HH, GERD> on Prilosec20mg /d + antireflux regimen in view of her [place] etc; she denies abd pain, n/v, notes intermit diarrhea, no blood seen...    Hx rectal cancer> still w/ loose stool but not diarrhea (uses Immodium prn); on Align & Activia; she's avoiding lactose & gas is improved, improved after nutrition consult.     DJD, Osteopenia> on Fosamax70/wk along w/ Ca, MVI, VitD; she is getting along well but hasn't played golf yet...    Other problems as noted... She likes to take a number of supplements... We reviewed prob list, meds, xrays and labs> see below for updates >> she will get the 2013 Flu vaccine at [facility] soon... CXR 11/13  showed borderline cardiomeg, ectasia of the Ao, chronic lung changes/ NAD, DJD/ spondylosis... LABS 11/13:  Chems- wnl;  CBC- wnl;  UA- ok...          Problem List:   pt's cell # [id number]  ALLERGY (ICD-995.3) - uses OTC meds as needed.  ASTHMATIC BRONCHITIS, ACUTE (ICD-466.0) - uses PROAIR as needed, but doing well, breathing OK. ~  7/09:  AB exac Rx w/ Pred, Avelox, Mucinex, etc... symptoms resolved. ~  CXR: baseline film w/ mild cardiomeg, scarring, chr changes, HH seen, & NAD.Marland Kitchen. ~  f/u CXR 6/11 & 7/12>  no change... 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 exam showed bibasilar velcro rales about 1/3rd way up> no change.  HYPERTENSION >> on NORVASC 5mg - 1/2 tab daily... ~  1/13: BP= 160/80 & we decided to start Norvasc5mg /d... subeq cut down to 1/2 tab per phone message. ~  3/13: BP= 154/84 & repeat 124/74; she feels well & denies CP, palpit, SOB, edema, etc; continue same... ~  5/13:  BP= 150/68 & she remains asymptomatic &in good spirits; ok to continue same for now, monitor BP at home. ~  11/13:  BP= 122/70 & she continues to gain strength slowly...  MITRAL VALVE PROLAPSE (ICD-424.0) - on ASA 81mg /d... had 2DEcho 2/04 showing MVP, mild MR, normal LVF... StressEcho w/ EF=60%... subseq Cardiolite 12/03 was normal w/o ischemia and EF=65%... denies CP, palpit, dizzy, syncope, edema, etc; she is gradually increasing activities post op... ~  EKG 11/12 showed NSR, rate60, sl incr voltage, NAD...  HYPERCHOLESTEROLEMIA (ICD-272.0) - controlled on LIPITOR 20mg /d,  and tol well...  ~  labs 7/08 showed TChol 183, TG 111, HDL 65, LDL 96... Doing well, continue Lip20. ~  FLP 11/09 showed TChol 189, TG 70, HDL 71, LDL 104 ~  FLP 5/10 showed TChol 173, TG 53, HDL 75, LDL 87... Remains stable on Lip20. ~  FLP 6/11  showed TChol 183, TG 103, HDL 74, LDL 88 ~  FLP 7/12 showed TChol 182, TG 127, HDL 74, LDL 82... Continues stable on Lip20. ~  FLP 5/13 on Lip20 showed TChol 167, TG 79, HDL 82, LDL 69... Continue same.  HIATAL HERNIA (ICD-553.3) & GERD (ICD-530.81) - she has a HH seen on CXR and CT Chest... takes OMEPRAZOLE 20mg /d and denies heartburn, GERD, regurg, dysphagia, etc... pt never had EGD  or Colonoscopy> she had repeatedly refused to sched a colonoscopy! ~  Q57mo ROVs>  she continues to deny reflux symptoms, abd pain, N/V, etc>  ADENOCARCINOMA of the RECTUM >> Dx 9/12 via colonoscopy for hematochezia & 11/12 underwent laparoscopic assisted low anterior resection by DrByerly CCS & left ovarian cystectomy (benign) by Samuel Jester; final path = T2 N0 mod diff adenocarcinoma invading into but not through the muscularis, no lymphatic invasion, 12 neg LNs, clear margins... ~  6/13:  She continues to f/u w/ DrByerly; c/o some loose stools, otherw ok... ~  11/13: she had f/u appt w/ DrGessner- they have sched f/u colonoscopy 02/14/12...  RECURRENT UTIs>  Routine urinalysis w/ WBC, Bact, Leukocyte+, etc... Treated w/ Cipro as required.  DEGENERATIVE JOINT DISEASE (ICD-715.90) - mild in fingers, no other complaints x "I can't make quick turns anymore"... she notes the Tonic Water Qhs works great for her nightime cramps, & on NEURONTIN 100mg - 2Qhs for ?neuropathy symptoms?  OSTEOPENIA (ICD-733.90) - BMDs here in 2003, 2005, 2007- showed osteopenia/ osteoporosis w/ TScores -1.9 to -2.5.Marland KitchenMarland Kitchen on FOSAMAX, Ca++, Vits... ~  labs 11/09 showed Vit D level = 28... rec to take OTC supplement 1000 u daily. ~  BMD here 7/11 showed TScores -2.3 in Lumbar Spine, and -2.3 in right FemNeck... continue Rx. ~  Labs 5/13 showed Vit D level = 54... rec to continue her 1000u supplement...  ANXIETY (ICD-300.00)  Health Maintenance - GYN= DrHenley & follow up [place] w/ neg exam she says and stool cards were neg by her report... last  Mammogram 6/10 at [facility] was neg... she refuses to consider a colonoscopy... OK 2010 flu vaccine 11/10... had PNEUMOVAX 2009... OK TDAP 6/11...   Past Surgical History  Procedure Date  . Nasal sinus surgery 1994  . Hammer toe surgery 1995    Dr. Silverio Decamp  . Appendectomy   . Colonoscopy 11/27/10    rectal cancer, small sigmoid adenoma, diverticulosis  . Vaginal hysterectomy 1980's  . Ectopic pregnancy surgery     Midline incision  . Laparoscopic left oophorectomy, laparoscopic low anterior resection, placement of onq pain pump 02/08/2011  . Colon resection 02/08/2011    Procedure: LAPAROSCOPIC SIGMOID COLON RESECTION;  Surgeon: Stark Klein, MD;  Location: [facility];  Service: General;  Laterality: N/A;  Laparoscopic Lower Anterior Bowel Resection  . Ovarian cyst removal 02/08/2011    Procedure: OVARIAN CYSTECTOMY;  Surgeon: Stark Klein, MD;  Location: [facility];  Service: General;  Laterality: Left;  Removal of Left Ovary and Pelvic Mass  . Colon surgery     Outpatient Encounter Prescriptions as of 02/03/2012  Medication Sig Dispense Refill  . albuterol (PROVENTIL HFA;VENTOLIN HFA) 108 (90 BASE) MCG/ACT inhaler Inhale 2 puffs into the lungs every 6 (six) hours as needed. Wheezing       . alendronate (FOSAMAX) 70 MG tablet TAKE 1 TABLET BY MOUTH EVERY 7 DAYS. TAKE WITH A FULL GLASS OF WATER ON AN EMPTY STOMACH  4 tablet  4  . amLODipine (NORVASC) 5 MG tablet Take 2.5 mg by mouth daily.      Marland Kitchen ascorbic acid (VITAMIN C) 250 MG CHEW Chew 250 mg by mouth daily.       Marland Kitchen aspirin 81 MG tablet Take 81 mg by mouth every morning.       Marland Kitchen atorvastatin (LIPITOR) 20 MG tablet Take 1 tablet (20 mg total) by mouth at bedtime.  30 tablet  11  . Calcium Carbonate-Vitamin D (CALCIUM-VITAMIN D) 500-200 MG-UNIT per tablet Take 2  tablets by mouth daily.       . Cholecalciferol (VITAMIN D3) 1000 UNITS CAPS Take 1 capsule by mouth daily.       . ferrous sulfate 325 (65 FE) MG tablet Take 325 mg by mouth  daily with breakfast.      . loperamide (IMODIUM) 1 MG/5ML solution Take 2-4 mg by mouth 4 (four) times daily as needed.      . Na Sulfate-K Sulfate-Mg Sulf (SUPREP BOWEL PREP) SOLN Use as directed  2 Bottle  0  . omeprazole (PRILOSEC) 20 MG capsule Take 20 mg by mouth daily.       . vitamin B-12 (CYANOCOBALAMIN) 250 MCG tablet Take 250 mcg by mouth daily.       . vitamin E (VITAMIN E) 200 UNIT capsule Take 200 Units by mouth daily.        Facility-Administered Encounter Medications as of 02/03/2012  Medication Dose Route Frequency Provider Last Rate Last Dose  . ondansetron (ZOFRAN) injection    PRN Ofilia Neas, CRNA   1 mg at 02/08/11 0915     Allergies  Allergen Reactions  . Sulfonamide Derivatives Nausea And Vomiting    Happened a long time ago, does not remember the other reaction she had.    Current Medications, Allergies, Past Medical History, Past Surgical History, Family History, and Social History were reviewed in [employer] record.   Review of Systems         See HPI - all other systems neg except as noted...  The patient denies anorexia, fever, weight loss, weight gain, vision loss, decreased hearing, hoarseness, chest pain, syncope, dyspnea on exertion, peripheral edema, prolonged cough, headaches, hemoptysis, abdominal pain, melena, hematochezia, severe indigestion/heartburn, hematuria, incontinence, muscle weakness, suspicious skin lesions, transient blindness, difficulty walking, depression, unusual weight change, abnormal bleeding, enlarged lymph nodes, and angioedema.   Objective:   Physical Exam     WD, WN, 78 y/o WF in NAD... GENERAL:  Alert & oriented; pleasant & cooperative... HEENT:  [place]/AT, Glasses, EOM-wnl, PERRLA, EACs-clear, TMs-wnl, NOSE-clear, THROAT-clear & wnl. NECK:  Supple w/ fair ROM; no JVD; normal carotid impulses w/o bruits; no thyromegaly or nodules palpated; no lymphadenopathy. CHEST:  few bibasilar dry rales about  1/3rd way up, no wheezing, no rhonchi, no cough, etc... HEART:  Regular Rhythm; without murmurs/ rubs/ or gallops, no msc heard... ABDOMEN:  Surg scar healed, nontender to palp, normal bowel sounds; no organomegaly or masses detected. EXT:  no deformities, mild arthritic changes; no varicose veins/ +venous insuffic/ no edema. NEURO:  CN's intact; no focal neuro deficits... DERM:  No lesions noted; no rash etc...  RADIOLOGY DATA:  Reviewed in the EPIC EMR & discussed w/ the patient...  LABORATORY DATA:  Reviewed in the EPIC EMR & discussed w/ the patient...   Assessment & Plan:    PULM>  AB, Pulm Fibrosis>  Overall stable on prn Proair; CXR reviewed & resp exam w/o changes, stable.  HBP>  BP improved w/ low dose Amlodipine 2.5mg /d, continue same & monitor at home...  MVP>  She is essent asymptomatic w/o CP, palpit, SOB, edema, etc...  CHOL>  Stable on Lip20 + diet, continue same...  GI> HH, GERD>  Stable on Prilosec, continue same...  RECTAL CANCER>  As above- she is post op 11/12 & had uneventful rehab; slowly improving and diarrhea is diminished w/ Align/ Activia; she will maintain f/u w/ DrByerly & DrGessner ==> f/u colonoscopy planned for 11/13...  DJD/ Osteopenia>  She  remains on Fosamax, Calcium, MVI, Vit D, etc; she will be due for BMD next yr...  UTI>  Prev treated w/ Cipro empirically & currently asymptomatic...  Anxiety>  Stable & she does not require anxiolytic Rx...   Patient's Medications  New Prescriptions   No medications on file  Previous Medications   ALBUTEROL (PROVENTIL HFA;VENTOLIN HFA) 108 (90 BASE) MCG/ACT INHALER    Inhale 2 puffs into the lungs every 6 (six) hours as needed. Wheezing    ALENDRONATE (FOSAMAX) 70 MG TABLET    TAKE 1 TABLET BY MOUTH EVERY 7 DAYS. TAKE WITH A FULL GLASS OF WATER ON AN EMPTY STOMACH   AMLODIPINE (NORVASC) 5 MG TABLET    Take 2.5 mg by mouth daily.   ASCORBIC ACID (VITAMIN C) 250 MG CHEW    Chew 250 mg by mouth daily.     ASPIRIN 81 MG TABLET    Take 81 mg by mouth every morning.    ATORVASTATIN (LIPITOR) 20 MG TABLET    Take 1 tablet (20 mg total) by mouth at bedtime.   CALCIUM CARBONATE-VITAMIN D (CALCIUM-VITAMIN D) 500-200 MG-UNIT PER TABLET    Take 2 tablets by mouth daily.    CHOLECALCIFEROL (VITAMIN D3) 1000 UNITS CAPS    Take 1 capsule by mouth daily.    FERROUS SULFATE 325 (65 FE) MG TABLET    Take 325 mg by mouth daily with breakfast.   LOPERAMIDE (LOPERAMIDE A-D) 2 MG TABLET    Take 1/2 tablet by mouth every other day   NA SULFATE-K SULFATE-MG SULF (SUPREP BOWEL PREP) SOLN    Use as directed   OMEPRAZOLE (PRILOSEC) 20 MG CAPSULE    Take 20 mg by mouth daily.    VITAMIN B-12 (CYANOCOBALAMIN) 250 MCG TABLET    Take 250 mcg by mouth daily.    VITAMIN E (VITAMIN E) 200 UNIT CAPSULE    Take 200 Units by mouth daily.   Modified Medications   No medications on file  Discontinued Medications   LOPERAMIDE (IMODIUM) 1 MG/5ML SOLUTION    Take 2-4 mg by mouth 4 (four) times daily as needed.

## 2012-02-05 ENCOUNTER — Encounter: Payer: Self-pay | Admitting: Pulmonary Disease

## 2012-02-07 ENCOUNTER — Encounter: Payer: Medicare Other | Admitting: Internal Medicine

## 2012-02-07 ENCOUNTER — Telehealth: Payer: Self-pay | Admitting: Pulmonary Disease

## 2012-02-07 NOTE — Telephone Encounter (Signed)
Called and spoke with pt and she is aware of lab and cxr results per SN.  Nothing further is needed.

## 2012-02-14 ENCOUNTER — Ambulatory Visit (AMBULATORY_SURGERY_CENTER): Payer: Medicare Other | Admitting: Internal Medicine

## 2012-02-14 ENCOUNTER — Encounter: Payer: Self-pay | Admitting: Internal Medicine

## 2012-02-14 VITALS — BP 170/69 | HR 62 | Temp 97.5°F | Resp 16 | Ht 67.0 in | Wt 134.0 lb

## 2012-02-14 DIAGNOSIS — Z85048 Personal history of other malignant neoplasm of rectum, rectosigmoid junction, and anus: Secondary | ICD-10-CM

## 2012-02-14 DIAGNOSIS — R197 Diarrhea, unspecified: Secondary | ICD-10-CM | POA: Diagnosis not present

## 2012-02-14 DIAGNOSIS — K5730 Diverticulosis of large intestine without perforation or abscess without bleeding: Secondary | ICD-10-CM

## 2012-02-14 DIAGNOSIS — I10 Essential (primary) hypertension: Secondary | ICD-10-CM | POA: Diagnosis not present

## 2012-02-14 DIAGNOSIS — J8410 Pulmonary fibrosis, unspecified: Secondary | ICD-10-CM | POA: Diagnosis not present

## 2012-02-14 MED ORDER — SODIUM CHLORIDE 0.9 % IV SOLN: 500.0000 mL | INTRAVENOUS | Status: DC

## 2012-02-14 NOTE — Patient Instructions (Addendum)
I saw diverticulosis and a normal anastomosis or connection where the cancer was removed.  This is good news.  I think the diarrhea and leakage problems are related to the post-operative changes as the rectum is somewhat smaller now. Keep trying the Imodium AD - perhaps a 1/2 tablet every day will keep things ok without surprises. There is no one right answer but sounds like using a preventive agent every day or every other day is sensible.  Let me know if things are not working to your satisfaction and I will try to help.  You do not need routine colonoscopy in the future.  Thank you for choosing me and [facility].  Gatha Mayer, MD, FACG   YOU HAD AN ENDOSCOPIC PROCEDURE TODAY AT [facility]: Refer to the procedure report that was given to you for any specific questions about what was found during the examination.  If the procedure report does not answer your questions, please call your gastroenterologist to clarify.  If you requested that your care partner not be given the details of your procedure findings, then the procedure report has been included in a sealed envelope for you to review at your convenience later.  YOU SHOULD EXPECT: Some feelings of bloating in the abdomen. Passage of more gas than usual.  Walking can help get rid of the air that was put into your GI tract during the procedure and reduce the bloating. If you had a lower endoscopy (such as a colonoscopy or flexible sigmoidoscopy) you may notice spotting of blood in your stool or on the toilet paper. If you underwent a bowel prep for your procedure, then you may not have a normal bowel movement for a few days.  DIET: Your first meal following the procedure should be a light meal and then it is ok to progress to your normal diet.  A half-sandwich or bowl of soup is an example of a good first meal.  Heavy or fried foods are harder to digest and may make you feel nauseous or bloated.  Likewise  meals heavy in dairy and vegetables can cause extra gas to form and this can also increase the bloating.  Drink plenty of fluids but you should avoid alcoholic beverages for 24 hours.  ACTIVITY: Your care partner should take you home directly after the procedure.  You should plan to take it easy, moving slowly for the rest of the day.  You can resume normal activity the day after the procedure however you should NOT DRIVE or use heavy machinery for 24 hours (because of the sedation medicines used during the test).    SYMPTOMS TO REPORT IMMEDIATELY: A gastroenterologist can be reached at any hour.  During normal business hours, 8:30 AM to 5:00 PM Monday through Friday, call [phone].  After hours and on weekends, please call the GI answering service at [phone] who will take a message and have the physician on call contact you.   Following lower endoscopy (colonoscopy or flexible sigmoidoscopy):  Excessive amounts of blood in the stool  Significant tenderness or worsening of abdominal pains  Swelling of the abdomen that is new, acute  Fever of 100F or higher FOLLOW UP: If any biopsies were taken you will be contacted by phone or by letter within the next 1-3 weeks.  Call your gastroenterologist if you have not heard about the biopsies in 3 weeks.  Our staff will call the home number listed on your records the next business  day following your procedure to check on you and address any questions or concerns that you may have at that time regarding the information given to you following your procedure. This is a courtesy call and so if there is no answer at the home number and we have not heard from you through the emergency physician on call, we will assume that you have returned to your regular daily activities without incident.  SIGNATURES/CONFIDENTIALITY: You and/or your care partner have signed paperwork which will be entered into your electronic medical record.  These signatures  attest to the fact that that the information above on your After Visit Summary has been reviewed and is understood.  Full responsibility of the confidentiality of this discharge information lies with you and/or your care-partner.

## 2012-02-14 NOTE — Progress Notes (Signed)
Patient did not experience any of the following events: a burn prior to discharge; a fall within the facility; wrong site/side/patient/procedure/implant event; or a hospital transfer or hospital admission upon discharge from the facility. (G8907) Patient did not have preoperative order for IV antibiotic SSI prophylaxis. (G8918)  

## 2012-02-14 NOTE — Op Note (Signed)
[place]  [address] [place], [id number]   COLONOSCOPY PROCEDURE REPORT  PATIENT: Hannah Mercado, Hannah Mercado  MR#: [id number] BIRTHDATE: Mar 13, 1923 , 89  yrs. old GENDER: Female ENDOSCOPIST: Gatha Mayer, MD, [facility] PROCEDURE DATE:  02/14/2012 PROCEDURE:   Colonoscopy, diagnostic ASA CLASS:   Class III INDICATIONS:High risk patient with personal history of rectal cancer. having diarrhea and incontinence after surgery (improving over time) MEDICATIONS: Propofol (Diprivan) 90 mg IV, MAC sedation, administered by CRNA, and These medications were titrated to patient response per physician's verbal order  DESCRIPTION OF PROCEDURE:   After the risks benefits and alternatives of the procedure were thoroughly explained, informed consent was obtained.  A digital rectal exam revealed no abnormalities of the rectum.   The LB [id number]-[id number]  endoscope was introduced through the anus and advanced to the cecum, which was identified by both the appendix and ileocecal valve. No adverse events experienced.   The quality of the prep was Suprep excellent The instrument was then slowly withdrawn as the colon was fully examined.      COLON FINDINGS: Severe diverticulosis was noted The finding was in the left colon.   Mild diverticulosis was noted The finding was in the right colon.   There was evidence of a prior end-to-end colorectal surgical anastomosis in the rectum.   The colon mucosa was otherwise normal.  Retroflexion was not performed due to a narrow rectal vault. The time to cecum=2 minutes 08 seconds. Withdrawal time=6 minutes 57 seconds.  The scope was withdrawn and the procedure completed. COMPLICATIONS: There were no complications.  ENDOSCOPIC IMPRESSION: 1.   Severe diverticulosis was noted in the left colon 2.   Mild diverticulosis was noted in the right colon 3.   There was evidence of a prior colorectal surgical anastomosis in the rectum 4.   The colon  mucosa was otherwise normal, excellent prep  RECOMMENDATIONS: GI follow up as needed Imodium AD daily or as needed to help diarrhea issues   eSigned:  Gatha Mayer, MD, [facility] 02/14/2012 3:57 PM cc: Stark Klein, MD and The Patient

## 2012-02-15 ENCOUNTER — Telehealth: Payer: Self-pay | Admitting: *Deleted

## 2012-02-15 NOTE — Telephone Encounter (Signed)
  Follow up Call-  Call back number 02/14/2012 11/27/2010  Post procedure Call Back phone  # [phone]  Permission to leave phone message Yes -     Patient questions:  Do you have a fever, pain , or abdominal swelling? no Pain Score  0 *  Have you tolerated food without any problems? yes  Have you been able to return to your normal activities? yes  Do you have any questions about your discharge instructions: Diet   no Medications  no Follow up visit  no  Do you have questions or concerns about your Care? no  Actions: * If pain score is 4 or above: No action needed, pain <4.

## 2012-02-16 DIAGNOSIS — Z23 Encounter for immunization: Secondary | ICD-10-CM | POA: Diagnosis not present

## 2012-03-24 ENCOUNTER — Encounter (INDEPENDENT_AMBULATORY_CARE_PROVIDER_SITE_OTHER): Payer: Medicare Other | Admitting: General Surgery

## 2012-04-09 ENCOUNTER — Other Ambulatory Visit: Payer: Self-pay | Admitting: Pulmonary Disease

## 2012-04-21 ENCOUNTER — Encounter (INDEPENDENT_AMBULATORY_CARE_PROVIDER_SITE_OTHER): Payer: Self-pay | Admitting: General Surgery

## 2012-04-21 ENCOUNTER — Ambulatory Visit (INDEPENDENT_AMBULATORY_CARE_PROVIDER_SITE_OTHER): Payer: Medicare Other | Admitting: General Surgery

## 2012-04-21 VITALS — BP 126/70 | HR 74 | Temp 97.0°F | Resp 18 | Ht 67.0 in | Wt 131.0 lb

## 2012-04-21 DIAGNOSIS — C20 Malignant neoplasm of rectum: Secondary | ICD-10-CM

## 2012-04-21 NOTE — Patient Instructions (Signed)
Follow up with me in 6 months.  Call if symptoms recur.

## 2012-04-21 NOTE — Assessment & Plan Note (Signed)
Pt without clinical evidence of disease.  Follow up colonoscopy in 2016 or later.    Will get CEA at next visit.

## 2012-04-21 NOTE — Progress Notes (Signed)
HISTORY: Pt now around 14 months s/p lap LAR for rectal cancer.  She is doing well.  She has had some issues with diarrhea, but these have improved since she altered her diet.  She takes an occasional imodium.  She had a colonoscopy in November with Dr. Carlean Purl and no polyps were seen. She has not had any issues with constipation.  She is not having any blood in her stools.     PERTINENT REVIEW OF SYSTEMS: Otherwise negative.     EXAM: Head: Normocephalic and atraumatic.  Eyes:  Conjunctivae are normal. Pupils are equal, round, and reactive to light. No scleral icterus.  Neck:  Normal range of motion. Neck supple. No tracheal deviation present. No thyromegaly present.  Resp: No respiratory distress, normal effort. Abd:  Abdomen is soft, non distended and non tender. No masses are palpable.  There is no rebound and no guarding. No evidence of hernia.   Neurological: Alert and oriented to person, place, and time. Coordination normal.  Skin: Skin is warm and dry. No rash noted. No diaphoretic. No erythema. No pallor.  Psychiatric: Normal mood and affect. Normal behavior. Judgment and thought content normal.      ASSESSMENT AND PLAN:   pT2pN0 (0/12 lymph nodes) cM0 Rectal Cancer Pt without clinical evidence of disease.  Follow up colonoscopy in 2016 or later.    Will get CEA at next visit.        Milus Height, MD Surgical Oncology, [facility]  Noralee Space, MD Noralee Space, MD

## 2012-07-03 ENCOUNTER — Other Ambulatory Visit: Payer: Self-pay | Admitting: Pulmonary Disease

## 2012-08-02 ENCOUNTER — Encounter: Payer: Self-pay | Admitting: Pulmonary Disease

## 2012-08-02 ENCOUNTER — Other Ambulatory Visit (INDEPENDENT_AMBULATORY_CARE_PROVIDER_SITE_OTHER): Payer: Medicare Other

## 2012-08-02 ENCOUNTER — Ambulatory Visit (INDEPENDENT_AMBULATORY_CARE_PROVIDER_SITE_OTHER): Payer: Medicare Other | Admitting: Pulmonary Disease

## 2012-08-02 VITALS — BP 122/64 | HR 85 | Temp 97.4°F | Ht 66.5 in | Wt 128.0 lb

## 2012-08-02 DIAGNOSIS — J8410 Pulmonary fibrosis, unspecified: Secondary | ICD-10-CM

## 2012-08-02 DIAGNOSIS — F411 Generalized anxiety disorder: Secondary | ICD-10-CM

## 2012-08-02 DIAGNOSIS — K449 Diaphragmatic hernia without obstruction or gangrene: Secondary | ICD-10-CM

## 2012-08-02 DIAGNOSIS — I10 Essential (primary) hypertension: Secondary | ICD-10-CM

## 2012-08-02 DIAGNOSIS — M1990 Unspecified osteoarthritis, unspecified site: Secondary | ICD-10-CM

## 2012-08-02 DIAGNOSIS — M949 Disorder of cartilage, unspecified: Secondary | ICD-10-CM

## 2012-08-02 DIAGNOSIS — K5730 Diverticulosis of large intestine without perforation or abscess without bleeding: Secondary | ICD-10-CM

## 2012-08-02 DIAGNOSIS — E7800 Pure hypercholesterolemia, unspecified: Secondary | ICD-10-CM

## 2012-08-02 DIAGNOSIS — I059 Rheumatic mitral valve disease, unspecified: Secondary | ICD-10-CM | POA: Diagnosis not present

## 2012-08-02 DIAGNOSIS — C20 Malignant neoplasm of rectum: Secondary | ICD-10-CM

## 2012-08-02 DIAGNOSIS — N390 Urinary tract infection, site not specified: Secondary | ICD-10-CM

## 2012-08-02 DIAGNOSIS — K219 Gastro-esophageal reflux disease without esophagitis: Secondary | ICD-10-CM

## 2012-08-02 DIAGNOSIS — M899 Disorder of bone, unspecified: Secondary | ICD-10-CM

## 2012-08-02 HISTORY — DX: Diverticulosis of large intestine without perforation or abscess without bleeding: K57.30

## 2012-08-02 LAB — URINALYSIS, ROUTINE W REFLEX MICROSCOPIC
Bilirubin Urine: NEGATIVE
Hgb urine dipstick: NEGATIVE
Nitrite: NEGATIVE
pH: 7 (ref 5.0–8.0)

## 2012-08-02 LAB — LIPID PANEL
Cholesterol: 174 mg/dL (ref 0–200)
HDL: 78 mg/dL (ref >39.00)
Total CHOL/HDL Ratio: 2
Triglycerides: 64 mg/dL (ref 0.0–149.0)

## 2012-08-02 LAB — HEPATIC FUNCTION PANEL
ALT: 13 U/L (ref 0–35)
Albumin: 4 g/dL (ref 3.5–5.2)
Bilirubin, Direct: 0.1 mg/dL (ref 0.0–0.3)
Total Protein: 7.7 g/dL (ref 6.0–8.3)

## 2012-08-02 LAB — CBC WITH DIFFERENTIAL/PLATELET
Basophils Absolute: 0 10*3/uL (ref 0.0–0.1)
Basophils Relative: 0.5 % (ref 0.0–3.0)
Eosinophils Absolute: 0.1 10*3/uL (ref 0.0–0.7)
Lymphocytes Relative: 19.4 % (ref 12.0–46.0)
MCHC: 34.3 g/dL (ref 30.0–36.0)
Monocytes Relative: 6 % (ref 3.0–12.0)
Neutrophils Relative %: 72.7 % (ref 43.0–77.0)
RBC: 4.01 Mil/uL (ref 3.87–5.11)

## 2012-08-02 LAB — BASIC METABOLIC PANEL
BUN: 19 mg/dL (ref 6–23)
CO2: 29 mEq/L (ref 19–32)
Chloride: 108 mEq/L (ref 96–112)
Creatinine, Ser: 1 mg/dL (ref 0.4–1.2)
Glucose, Bld: 98 mg/dL (ref 70–99)

## 2012-08-02 MED ORDER — ALENDRONATE SODIUM 70 MG PO TABS: ORAL_TABLET | ORAL | Status: DC

## 2012-08-02 NOTE — Patient Instructions (Addendum)
Today we updated your med list in our EPIC system...    Continue your current medications the same...    We refilled the meds you requested...  Add the FIBER, try METAMUCIL daily...  Today we did your follow up FASTING blood work...    We will contact you w/ the results when available...   Stay as active as possible, get back into the GOLFING...  Call for any questions...  Let's plan a follow up visit in 75mo, sooner if needed for problems.Marland KitchenMarland Kitchen

## 2012-08-02 NOTE — Progress Notes (Signed)
Subjective:    Patient ID: Hannah Mercado, female    DOB: Dec 06, 1922, 79 y.o.   MRN: [id number]  HPI 79 y/o WF here for a follow up visit...  ~  April 14, 2011:  90mo ROV & post hosp check> When last seen 2of6 stool cards returned pos for blood & she was sent to [facility] & colonoscopy 9/12 revealed rectal adenocarcinoma 7cm from the anus; 11/12 she had laparoscopic assisted low anterior resection by DrByerly CCS & left ovarian cystectomy (benign) by Samuel Jester; final path = T2 N0 mod diff adenocarcinoma invading into but not through the muscularis, no lymphatic invasion, 12 neg LNs, clear margins;  She lost wt & is down from 145# to 135# currently, appetite fair, eating well she says & gaining strength after her rehab stay, she has a great attitude!...    BP is elevated at 160/80 & we decided to add Norvasc 5mg /d to her regimen...    Currently she is c/o some diarrhea & rec to start Align, Activia, & ok to use Immodium prn...    She had some post op anemia w/ Hg down to 9.0 by disch, and this has improved to 12.7 now...  ~  March 7,2013:  6wk ROV & she continues to improve, gaining strength, & looks fabulous;  C/o BP variations but she only checks it periodically at the Pharm> numbers read from 98/62 to 180/70;  [name] is 120-140/70-80 & she is asymptomatic;  We had cut her Amlodipine5mg  to 1/2 daily & looks good on this dose- continue same...    Hx AB, pulm fibrosis> no resp symptoms & breathing good, getting back into her activities, etc...    MVP> stable w/ msc & gr1/6 SEM; no CP, palpit, ch in SOB, edema, etc...    Chol> remains on Lip20 & we discussed f/u FLP on return...    HH, GERD> on Prilosec20mg /d + antireflux regimen in view of her HH etc...    Hx rectal cancer> still w/ loose stool but not diarrhea; on Align & Activia; she's avoiding lactose & gas is improved, wants nutrition consult- ok (saw MaggieMay)...    Other problems as noted...  ~  Aug 03, 2011:  17mo ROV & overall Hannah Mercado is  improved;  BP controlled on sm dose of Amlodipine;  Chol looks great on Lip20;  GI symptoms improved;  We reviewed prob list, meds, xrays, & labs>  see below>> LABS 5/13:  FLP- all parameters at goals on Lip20;  Chems- wnl;  CBC- wnl & Iron is ok;  TSH=3.28;  VitD=54...  ~  February 03, 2012:  23mo ROV & Hannah Mercado has been doing satis- she has f/u colonoscopy planned for 02/14/12...    Hx AB, pulm fibrosis> on ProventilHFA prn; no resp symptoms & breathing good, & she denies cough, phlegm, SOB, etc; f/u CXR w/o acute changes...    MVP, HBP> on ASA81 & Amlod2.5; BP= 122/70 & 130s/70s at home; stable w/ msc & gr1/6 SEM; no CP, palpit, ch in SOB, edema, etc...    Chol> remains on Lip20 & FLP 5/13 looked great...    HH, GERD> on Prilosec20mg /d + antireflux regimen in view of her [place] etc; she denies abd pain, n/v, notes intermit diarrhea, no blood seen...    Hx rectal cancer> still w/ loose stool but not diarrhea (uses Immodium prn); on Align & Activia; she's avoiding lactose & gas is improved, improved after nutrition consult.     DJD, Osteopenia> on Fosamax70/wk along w/ Ca, MVI,  VitD; she is getting along well but hasn't played golf yet...    Other problems as noted... She likes to take a number of supplements... We reviewed prob list, meds, xrays and labs> see below for updates >> she will get the 2013 Flu vaccine at [facility] soon... CXR 11/13 showed borderline cardiomeg, ectasia of the Ao, chronic lung changes/ NAD, DJD/ spondylosis... LABS 11/13:  Chems- wnl;  CBC- wnl;  UA- ok...   ~  Aug 02, 2012:  68mo ROV & Hannah Mercado is doing satis at 70, notes sl decr energy still but hit some golf balls recently & plans to play 9 holes soon on a cool day... We reviewed the following medical problems during today's office visit >>     Hx AB, pulm fibrosis> on ProventilHFA prn; no resp symptoms & breathing good, & she denies cough, phlegm, SOB, etc; f/u CXR w/o acute changes...    MVP, HBP> on ASA81 & Amlod2.5; BP= 122/64 &  130s/70s at home; stable w/ msc & gr1/6 SEM; no CP, palpit, ch in SOB, edema, etc...    Chol> remains on Lip20 & FLP 5/14 looks great w/ TChol 174, TG 64, HDL 78, LDL 83    HH, GERD> on Prilosec20mg /d + antireflux regimen in view of her HH etc; she denies abd pain, n/v, notes intermit diarrhea, no blood seen...    Hx rectal cancer> s/p lap low anterior resection 11/12; f/u colonoscopy 11/13 by DrGessner- mod divertics, anastomosis ok; she sees DrByerly Q41mo- note 1/14 reviewed still w/ loose stool but not diarrhea (uses Immodium prn); on [place]; she's avoiding lactose & gas is improved, improved after nutrition consult.     DJD, Osteopenia> on Fosamax70/wk along w/ Ca, MVI, VitD; she is getting along well but hasn't played golf yet...    Other problems as noted... She likes to take a number of supplements... We reviewed prob list, meds, xrays and labs> see below for updates >>  LABS 5/14:  FLP- at goals on Lip20;  Chems- wnl;  CBC- wnl;  TSH=2.61;  VitD=56;  UA- few wbc, no bact...           Problem List:   pt's cell # [id number]  ALLERGY (ICD-995.3) - uses OTC meds as needed.  ASTHMATIC BRONCHITIS, ACUTE (ICD-466.0) - uses PROAIR as needed, but doing well, breathing OK. ~  7/09:  AB exac Rx w/ Pred, Avelox, Mucinex, etc... symptoms resolved. ~  CXR: baseline film w/ mild cardiomeg, scarring, chr changes, HH seen, & NAD.Marland Kitchen. ~  f/u CXR 6/11 & 7/12>  no change... 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 exam showed bibasilar velcro rales about 1/3rd way up> no change.  HYPERTENSION >> on NORVASC 5mg - 1/2 tab daily... ~  1/13: BP= 160/80 & we decided to start Norvasc5mg /d... subeq cut down to 1/2 tab per phone message. ~  3/13: BP= 154/84 & repeat 124/74; she feels well & denies CP, palpit, SOB, edema,  etc; continue same... ~  5/13:  BP= 150/68 & she remains asymptomatic &in good spirits; ok to continue same for now, monitor BP at home. ~  11/13:  BP= 122/70 & she continues to gain strength slowly...  MITRAL VALVE PROLAPSE (ICD-424.0) - on ASA 81mg /d... had 2DEcho 2/04 showing MVP, mild MR, normal LVF... StressEcho w/  EF=60%... subseq Cardiolite 12/03 was normal w/o ischemia and EF=65%... denies CP, palpit, dizzy, syncope, edema, etc; she is gradually increasing activities post op... ~  EKG 11/12 showed NSR, rate60, sl incr voltage, NAD...  HYPERCHOLESTEROLEMIA (ICD-272.0) - controlled on LIPITOR 20mg /d,  and tol well...  ~  labs 7/08 showed TChol 183, TG 111, HDL 65, LDL 96... Doing well, continue Lip20. ~  FLP 11/09 showed TChol 189, TG 70, HDL 71, LDL 104 ~  FLP 5/10 showed TChol 173, TG 53, HDL 75, LDL 87... Remains stable on Lip20. ~  FLP 6/11 showed TChol 183, TG 103, HDL 74, LDL 88 ~  FLP 7/12 showed TChol 182, TG 127, HDL 74, LDL 82... Continues stable on Lip20. ~  FLP 5/13 on Lip20 showed TChol 167, TG 79, HDL 82, LDL 69... Continue same.  HIATAL HERNIA (ICD-553.3) & GERD (ICD-530.81) - she has a HH seen on CXR and CT Chest... takes OMEPRAZOLE 20mg /d and denies heartburn, GERD, regurg, dysphagia, etc... pt never had EGD or Colonoscopy> she had repeatedly refused to sched a colonoscopy! ~  Q21mo ROVs>  she continues to deny reflux symptoms, abd pain, N/V, etc>  ADENOCARCINOMA of the RECTUM >> Dx 9/12 via colonoscopy for hematochezia & 11/12 underwent laparoscopic assisted low anterior resection by DrByerly CCS & left ovarian cystectomy (benign) by Samuel Jester; final path = T2 N0 mod diff adenocarcinoma invading into but not through the muscularis, no lymphatic invasion, 12 neg LNs, clear margins... ~  6/13:  She continues to f/u w/ DrByerly; c/o some loose stools, otherw ok... ~  11/13: she had f/u appt w/ DrGessner- Colonoscopy 11/13 showed severe divertics on left, mod on right, clean  anastomosis...  RECURRENT UTIs>  Routine urinalysis w/ WBC, Bact, Leukocyte+, etc... Treated w/ Cipro as required.  DEGENERATIVE JOINT DISEASE (ICD-715.90) - mild in fingers, no other complaints x "I can't make quick turns anymore"... she notes the Tonic Water Qhs works great for her nightime cramps, & on NEURONTIN 100mg - 2Qhs for ?neuropathy symptoms?  OSTEOPENIA (ICD-733.90) - BMDs here in 2003, 2005, 2007- showed osteopenia/ osteoporosis w/ TScores -1.9 to -2.5.Marland KitchenMarland Kitchen on FOSAMAX, Ca++, Vits... ~  labs 11/09 showed Vit D level = 28... rec to take OTC supplement 1000 u daily. ~  BMD here 7/11 showed TScores -2.3 in Lumbar Spine, and -2.3 in right FemNeck... continue Rx. ~  Labs 5/13 showed Vit D level = 54... rec to continue her 1000u supplement...  ANXIETY (ICD-300.00)  Health Maintenance - GYN= DrHenley & follow up [place] w/ neg exam she says and stool cards were neg by her report... last Mammogram 6/10 at [facility] was neg... she refuses to consider a colonoscopy... OK 2010 flu vaccine 11/10... had PNEUMOVAX 2009... OK TDAP 6/11...   Past Surgical History  Procedure Laterality Date  . Nasal sinus surgery  1994  . Hammer toe surgery  1995    Dr. Silverio Decamp  . Appendectomy    . Colonoscopy  11/27/10    rectal cancer, small sigmoid adenoma, diverticulosis  . Vaginal hysterectomy  1980's  . Ectopic pregnancy surgery      Midline incision  . Laparoscopic left oophorectomy, laparoscopic low anterior resection, placement of onq pain pump  02/08/2011  . Colon resection  02/08/2011    Procedure: LAPAROSCOPIC SIGMOID COLON RESECTION;  Surgeon: Stark Klein, MD;  Location: [facility];  Service: General;  Laterality: N/A;  Laparoscopic Lower Anterior Bowel Resection  . Ovarian cyst removal  02/08/2011    Procedure: OVARIAN CYSTECTOMY;  Surgeon:  Stark Klein, MD;  Location: [facility];  Service: General;  Laterality: Left;  Removal of Left Ovary and Pelvic Mass  . Colon surgery      Outpatient  Encounter Prescriptions as of 08/02/2012  Medication Sig Dispense Refill  . albuterol (PROVENTIL HFA;VENTOLIN HFA) 108 (90 BASE) MCG/ACT inhaler Inhale 2 puffs into the lungs every 6 (six) hours as needed. Wheezing       . alendronate (FOSAMAX) 70 MG tablet TAKE 1 TABLET BY MOUTH EVERY 7 DAYS. TAKE WITH A FULL GLASS OF WATER ON AN EMPTY STOMACH  4 tablet  4  . amLODipine (NORVASC) 5 MG tablet Take 2.5 mg by mouth daily.      Marland Kitchen ascorbic acid (VITAMIN C) 250 MG CHEW Chew 250 mg by mouth daily.       Marland Kitchen aspirin 81 MG tablet Take 81 mg by mouth every morning.       Marland Kitchen atorvastatin (LIPITOR) 20 MG tablet TAKE 1 TABLET (20 MG TOTAL) BY MOUTH AT BEDTIME.  30 tablet  11  . Calcium Carbonate-Vitamin D (CALCIUM-VITAMIN D) 500-200 MG-UNIT per tablet Take 2 tablets by mouth daily.       . Cholecalciferol (VITAMIN D3) 1000 UNITS CAPS Take 1 capsule by mouth daily.       . ferrous sulfate 325 (65 FE) MG tablet Take 325 mg by mouth daily with breakfast.      . loperamide (LOPERAMIDE A-D) 2 MG tablet Take 1/2 tablet by mouth every other day      . omeprazole (PRILOSEC) 20 MG capsule Take 20 mg by mouth daily.       . vitamin B-12 (CYANOCOBALAMIN) 250 MCG tablet Take 250 mcg by mouth daily.       . vitamin E (VITAMIN E) 200 UNIT capsule Take 200 Units by mouth daily.        Facility-Administered Encounter Medications as of 08/02/2012  Medication Dose Route Frequency Provider Last Rate Last Dose  . ondansetron (ZOFRAN) injection    PRN Ofilia Neas, CRNA   1 mg at 02/08/11 0915     Allergies  Allergen Reactions  . Sulfonamide Derivatives Nausea And Vomiting    Happened a long time ago, does not remember the other reaction she had.    Current Medications, Allergies, Past Medical History, Past Surgical History, Family History, and Social History were reviewed in [employer] record.   Review of Systems         See HPI - all other systems neg except as noted...  The patient denies  anorexia, fever, weight loss, weight gain, vision loss, decreased hearing, hoarseness, chest pain, syncope, dyspnea on exertion, peripheral edema, prolonged cough, headaches, hemoptysis, abdominal pain, melena, hematochezia, severe indigestion/heartburn, hematuria, incontinence, muscle weakness, suspicious skin lesions, transient blindness, difficulty walking, depression, unusual weight change, abnormal bleeding, enlarged lymph nodes, and angioedema.   Objective:   Physical Exam     WD, WN, 79 y/o WF in NAD... GENERAL:  Alert & oriented; pleasant & cooperative... HEENT:  [place]/AT, Glasses, EOM-wnl, PERRLA, EACs-clear, TMs-wnl, NOSE-clear, THROAT-clear & wnl. NECK:  Supple w/ fair ROM; no JVD; normal carotid impulses w/o bruits; no thyromegaly or nodules palpated; no lymphadenopathy. CHEST:  few bibasilar dry rales about 1/3rd way up, no wheezing, no rhonchi, no cough, etc... HEART:  Regular Rhythm; without murmurs/ rubs/ or gallops, no msc heard... ABDOMEN:  Surg scar healed, nontender to palp, normal bowel sounds; no organomegaly or masses detected. EXT:  no deformities, mild arthritic  changes; no varicose veins/ +venous insuffic/ no edema. NEURO:  CN's intact; no focal neuro deficits... DERM:  No lesions noted; no rash etc...  RADIOLOGY DATA:  Reviewed in the EPIC EMR & discussed w/ the patient...  LABORATORY DATA:  Reviewed in the EPIC EMR & discussed w/ the patient...   Assessment & Plan:    PULM>  AB, Pulm Fibrosis>  Overall stable on prn Proair; CXR reviewed & resp exam w/o changes, stable.  HBP>  BP improved w/ low dose Amlodipine 2.5mg /d, continue same & monitor at home...  MVP>  She is essent asymptomatic w/o CP, palpit, SOB, edema, etc...  CHOL>  Stable on Lip20 + diet, continue same...  GI> HH, GERD>  Stable on Prilosec, continue same...  RECTAL CANCER>  As above- she is post op 11/12 & had uneventful rehab; slowly improving and diarrhea is diminished w/ Align/ Activia; she  will maintain f/u w/ DrByerly & DrGessner ==> f/u colonoscopy 11/13 w/ divertics, no recurrence.  DJD/ Osteopenia>  She remains on Fosamax, Calcium, MVI, Vit D, etc; she will be due for BMD next yr...  UTI>  Prev treated w/ Cipro empirically & currently asymptomatic...  Anxiety>  Stable & she does not require anxiolytic Rx...   Patient's Medications  New Prescriptions   No medications on file  Previous Medications   ALBUTEROL (PROVENTIL HFA;VENTOLIN HFA) 108 (90 BASE) MCG/ACT INHALER    Inhale 2 puffs into the lungs every 6 (six) hours as needed. Wheezing    AMLODIPINE (NORVASC) 5 MG TABLET    Take 2.5 mg by mouth daily.   ASCORBIC ACID (VITAMIN C) 250 MG CHEW    Chew 250 mg by mouth daily.    ASPIRIN 81 MG TABLET    Take 81 mg by mouth every morning.    ATORVASTATIN (LIPITOR) 20 MG TABLET    TAKE 1 TABLET (20 MG TOTAL) BY MOUTH AT BEDTIME.   CALCIUM CARBONATE-VITAMIN D (CALCIUM-VITAMIN D) 500-200 MG-UNIT PER TABLET    Take 2 tablets by mouth daily.    CHOLECALCIFEROL (VITAMIN D3) 1000 UNITS CAPS    Take 1 capsule by mouth daily.    FERROUS SULFATE 325 (65 FE) MG TABLET    Take 325 mg by mouth daily with breakfast.   LOPERAMIDE (LOPERAMIDE A-D) 2 MG TABLET    Take 1/2 tablet by mouth every other day   OMEPRAZOLE (PRILOSEC) 20 MG CAPSULE    Take 20 mg by mouth daily.    VITAMIN B-12 (CYANOCOBALAMIN) 250 MCG TABLET    Take 250 mcg by mouth daily.    VITAMIN E (VITAMIN E) 200 UNIT CAPSULE    Take 200 Units by mouth daily.   Modified Medications   Modified Medication Previous Medication   ALENDRONATE (FOSAMAX) 70 MG TABLET alendronate (FOSAMAX) 70 MG tablet      TAKE 1 TABLET BY MOUTH EVERY 7 DAYS. TAKE WITH A FULL GLASS OF WATER ON AN EMPTY STOMACH    TAKE 1 TABLET BY MOUTH EVERY 7 DAYS. TAKE WITH A FULL GLASS OF WATER ON AN EMPTY STOMACH  Discontinued Medications   No medications on file

## 2012-08-03 LAB — VITAMIN D 25 HYDROXY (VIT D DEFICIENCY, FRACTURES): Vit D, 25-Hydroxy: 56 ng/mL (ref 30–89)

## 2012-08-22 ENCOUNTER — Telehealth: Payer: Self-pay | Admitting: Pulmonary Disease

## 2012-08-22 MED ORDER — CIPROFLOXACIN HCL 250 MG PO TABS: 250.0000 mg | ORAL_TABLET | Freq: Two times a day (BID) | ORAL | Status: DC

## 2012-08-22 NOTE — Telephone Encounter (Signed)
Per SN---   Call in cipro 250 mg  #14  1 po bid.  Called and spoke with pt and she is aware of SN recs.  Pt is aware that this rx has been called in and nothing further is needed.

## 2012-08-22 NOTE — Telephone Encounter (Signed)
Last OV on 08-02-12. Pt called today c/o on Sunday she noticed her lower back was achy a little which she states is a sign that an infection is coming. She states last night she had to get up to use the restroom 5 times and each time there was some blood in her urine. She states she also has some mild discomfort with urination as well. Pt states that she has not noticed any blood this morning, but she has begun drinking a lot of water. Pt states she does not want to bring a sample by. States she has had uti several times in the past adn knows this is what is going on. Pt states she wants abx sent to [facility] [address]. Please advise. Woodson Bing, CMA Allergies  Allergen Reactions  . Sulfonamide Derivatives Nausea And Vomiting    Happened a long time ago, does not remember the other reaction she had.

## 2012-09-05 ENCOUNTER — Telehealth (INDEPENDENT_AMBULATORY_CARE_PROVIDER_SITE_OTHER): Payer: Self-pay

## 2012-09-05 ENCOUNTER — Other Ambulatory Visit (INDEPENDENT_AMBULATORY_CARE_PROVIDER_SITE_OTHER): Payer: Self-pay

## 2012-09-05 ENCOUNTER — Other Ambulatory Visit (INDEPENDENT_AMBULATORY_CARE_PROVIDER_SITE_OTHER): Payer: Self-pay | Admitting: General Surgery

## 2012-09-05 DIAGNOSIS — C20 Malignant neoplasm of rectum: Secondary | ICD-10-CM

## 2012-09-05 NOTE — Telephone Encounter (Signed)
Pt will go to [facility] sometime this week or early next week to have CEA drawn.  Will discuss at her appt 09/26/12 with Dr. Barry Dienes.

## 2012-09-13 DIAGNOSIS — C20 Malignant neoplasm of rectum: Secondary | ICD-10-CM | POA: Diagnosis not present

## 2012-09-13 DIAGNOSIS — Z1231 Encounter for screening mammogram for malignant neoplasm of breast: Secondary | ICD-10-CM | POA: Diagnosis not present

## 2012-09-14 DIAGNOSIS — R920 Mammographic microcalcification found on diagnostic imaging of breast: Secondary | ICD-10-CM | POA: Diagnosis not present

## 2012-09-14 DIAGNOSIS — Z1231 Encounter for screening mammogram for malignant neoplasm of breast: Secondary | ICD-10-CM | POA: Diagnosis not present

## 2012-09-18 ENCOUNTER — Other Ambulatory Visit: Payer: Self-pay | Admitting: Pulmonary Disease

## 2012-10-03 ENCOUNTER — Encounter (INDEPENDENT_AMBULATORY_CARE_PROVIDER_SITE_OTHER): Payer: Self-pay

## 2012-10-30 ENCOUNTER — Encounter (INDEPENDENT_AMBULATORY_CARE_PROVIDER_SITE_OTHER): Payer: Self-pay | Admitting: General Surgery

## 2012-10-30 ENCOUNTER — Ambulatory Visit (INDEPENDENT_AMBULATORY_CARE_PROVIDER_SITE_OTHER): Payer: Medicare Other | Admitting: General Surgery

## 2012-10-30 VITALS — BP 126/70 | HR 70 | Temp 98.1°F | Resp 15 | Ht 67.0 in | Wt 129.0 lb

## 2012-10-30 DIAGNOSIS — C20 Malignant neoplasm of rectum: Secondary | ICD-10-CM

## 2012-10-30 NOTE — Progress Notes (Signed)
HISTORY: Pt now around 21 months s/p lap LAR for rectal cancer.  She had a colonoscopy in November 2013 with Dr. Carlean Purl and no polyps were seen. She continues to have some occasional bouts of diarrhea and difficulty with urgency.  These episodes are getting fewer and farther apart.  She thinks some of this is nerves.  She is on a bowel regimen, and takes occasional imodium.  She denies rectal bleeding.  She has not had change in stool caliber.  She has not had any weight loss.  HCT has been good.     PERTINENT REVIEW OF SYSTEMS: Otherwise negative.     EXAM: Head: Normocephalic and atraumatic.  Eyes:  Conjunctivae are normal. Pupils are equal, round, and reactive to light. No scleral icterus.  Neck:  Normal range of motion. Neck supple. No tracheal deviation present. No thyromegaly present.  Resp: No respiratory distress, normal effort. Abd:  Abdomen is soft, non distended and non tender. No masses are palpable.  There is no rebound and no guarding. No evidence of hernia.   Rectal exam:  Firm stool in vault.  No palpable masses.  Staple line appreciated.  No nodularity.   Neurological: Alert and oriented to person, place, and time. Coordination normal.  Skin: Skin is warm and dry. No rash noted. No diaphoretic. No erythema. No pallor.  Psychiatric: Normal mood and affect. Normal behavior. Judgment and thought content normal.      ASSESSMENT AND PLAN:   pT2pN0 (0/12 lymph nodes) cM0 Rectal Cancer No clinical evidence of disease.   Almost 2 years out.  Follow up in 6 months. Will need CEA in December.    Colonoscopy due in 2 more years.         Milus Height, MD Surgical Oncology, [facility]  Noralee Space, MD No ref. provider found

## 2012-10-30 NOTE — Patient Instructions (Signed)
Follow up in 6 months.  Will recheck CEA in 6 months.    Not time for colonoscopy unless symptoms occur.

## 2012-10-30 NOTE — Assessment & Plan Note (Signed)
No clinical evidence of disease.   Almost 2 years out.  Follow up in 6 months. Will need CEA in December.    Colonoscopy due in 2 more years.

## 2013-01-04 DIAGNOSIS — Z23 Encounter for immunization: Secondary | ICD-10-CM | POA: Diagnosis not present

## 2013-02-07 ENCOUNTER — Encounter: Payer: Self-pay | Admitting: Pulmonary Disease

## 2013-02-07 ENCOUNTER — Ambulatory Visit (INDEPENDENT_AMBULATORY_CARE_PROVIDER_SITE_OTHER): Payer: Medicare Other | Admitting: Pulmonary Disease

## 2013-02-07 VITALS — BP 116/62 | HR 55 | Temp 97.6°F | Ht 66.5 in | Wt 129.8 lb

## 2013-02-07 DIAGNOSIS — I10 Essential (primary) hypertension: Secondary | ICD-10-CM

## 2013-02-07 DIAGNOSIS — G589 Mononeuropathy, unspecified: Secondary | ICD-10-CM

## 2013-02-07 DIAGNOSIS — K5730 Diverticulosis of large intestine without perforation or abscess without bleeding: Secondary | ICD-10-CM

## 2013-02-07 DIAGNOSIS — E7800 Pure hypercholesterolemia, unspecified: Secondary | ICD-10-CM

## 2013-02-07 DIAGNOSIS — J8410 Pulmonary fibrosis, unspecified: Secondary | ICD-10-CM | POA: Diagnosis not present

## 2013-02-07 DIAGNOSIS — I059 Rheumatic mitral valve disease, unspecified: Secondary | ICD-10-CM | POA: Diagnosis not present

## 2013-02-07 DIAGNOSIS — F411 Generalized anxiety disorder: Secondary | ICD-10-CM

## 2013-02-07 DIAGNOSIS — M899 Disorder of bone, unspecified: Secondary | ICD-10-CM

## 2013-02-07 DIAGNOSIS — C20 Malignant neoplasm of rectum: Secondary | ICD-10-CM

## 2013-02-07 DIAGNOSIS — K449 Diaphragmatic hernia without obstruction or gangrene: Secondary | ICD-10-CM

## 2013-02-07 DIAGNOSIS — M1990 Unspecified osteoarthritis, unspecified site: Secondary | ICD-10-CM

## 2013-02-07 NOTE — Patient Instructions (Signed)
Today we updated your med list in our EPIC system...    Continue your current medications the same...  Call for any questions...  Let's plan a follow up visit in 6mo, sooner if needed for problems...   

## 2013-02-07 NOTE — Progress Notes (Signed)
Subjective:    Patient ID: Hannah Mercado, female    DOB: August 29, 1922, 79 y.o.   MRN: [id number]  HPI 79 y/o WF here for a follow up visit...  ~  March 7,2013:  6wk ROV & she continues to improve, gaining strength, & looks fabulous;  C/o BP variations but she only checks it periodically at the Pharm> numbers read from 98/62 to 180/70;  Hannah Mercado is 120-140/70-80 & she is asymptomatic;  We had cut her Amlodipine5mg  to 1/2 daily & looks good on this dose- continue same...    Hx AB, pulm fibrosis> no resp symptoms & breathing good, getting back into her activities, etc...    MVP> stable w/ msc & gr1/6 SEM; no CP, palpit, ch in SOB, edema, etc...    Chol> remains on Lip20 & we discussed f/u FLP on return...    HH, GERD> on Prilosec20mg /d + antireflux regimen in view of her HH etc...    Hx rectal cancer> still w/ loose stool but not diarrhea; on Align & Activia; she's avoiding lactose & gas is improved, wants nutrition consult- ok (saw MaggieMay)...    Other problems as noted...  ~  Aug 03, 2011:  29mo ROV & overall Hannah Mercado is improved;  BP controlled on sm dose of Amlodipine;  Chol looks great on Lip20;  GI symptoms improved;  We reviewed prob list, meds, xrays, & labs>  see below>> LABS 5/13:  FLP- all parameters at goals on Lip20;  Chems- wnl;  CBC- wnl & Iron is ok;  TSH=3.28;  VitD=54...  ~  February 03, 2012:  74mo ROV & Merina has been doing satis- she has f/u colonoscopy planned for 02/14/12...    Hx AB, pulm fibrosis> on ProventilHFA prn; no resp symptoms & breathing good, & she denies cough, phlegm, SOB, etc; f/u CXR w/o acute changes...    MVP, HBP> on ASA81 & Amlod2.5; BP= 122/70 & 130s/70s at home; stable w/ msc & gr1/6 SEM; no CP, palpit, ch in SOB, edema, etc...    Chol> remains on Lip20 & FLP 5/13 looked great...    HH, GERD> on Prilosec20mg /d + antireflux regimen in view of her [place] etc; she denies abd pain, n/v, notes intermit diarrhea, no blood seen...    Hx rectal cancer> still w/ loose  stool but not diarrhea (uses Immodium prn); on Align & Activia; she's avoiding lactose & gas is improved, improved after nutrition consult.     DJD, Osteopenia> on Fosamax70/wk along w/ Ca, MVI, VitD; she is getting along well but hasn't played golf yet...    Other problems as noted... She likes to take a number of supplements... We reviewed prob list, meds, xrays and labs> see below for updates >> she will get the 2013 Flu vaccine at [facility] soon... CXR 11/13 showed borderline cardiomeg, ectasia of the Ao, chronic lung changes/ NAD, DJD/ spondylosis... LABS 11/13:  Chems- wnl;  CBC- wnl;  UA- ok...   ~  Aug 02, 2012:  27mo ROV & Hannah Mercado is doing satis at 76, notes sl decr energy still but hit some golf balls recently & plans to play 9 holes soon on a cool day... We reviewed the following medical problems during today's office visit >>     Hx AB, pulm fibrosis> on ProventilHFA prn; no resp symptoms & breathing good, & she denies cough, phlegm, SOB, etc; f/u CXR w/o acute changes...    MVP, HBP> on ASA81 & Amlod2.5; BP= 122/64 & 130s/70s at home; stable w/ msc &  gr1/6 SEM; no CP, palpit, ch in SOB, edema, etc...    Chol> remains on Lip20 & FLP 5/14 looks great w/ TChol 174, TG 64, HDL 78, LDL 83    HH, GERD> on Prilosec20mg /d + antireflux regimen in view of her HH etc; she denies abd pain, n/v, notes intermit diarrhea, no blood seen...    Hx rectal cancer> s/p lap low anterior resection 11/12; f/u colonoscopy 11/13 by DrGessner- mod divertics, anastomosis ok; she sees DrByerly Q19mo- note 1/14 reviewed still w/ loose stool but not diarrhea (uses Immodium prn); on [place]; she's avoiding lactose & gas is improved, improved after nutrition consult.     DJD, Osteopenia> on Fosamax70/wk along w/ Ca, MVI, VitD; she is getting along well but hasn't played golf yet...    Other problems as noted... She likes to take a number of supplements... We reviewed prob list, meds, xrays and labs> see below for updates  >>  LABS 5/14:  FLP- at goals on Lip20;  Chems- wnl;  CBC- wnl;  TSH=2.61;  VitD=56;  UA- few wbc, no bact...  ~  February 07, 2013:  61mo ROV & Hannah Mercado is doing well- great attitude & no new complaints or concerns;  She had f/u visit w/ DrByerly 8/14> 76yrs s/p rectal cancer surg, last colon 11/13 by DrGessner was neg, still gets occas bouts of diarrhea & some urgency; she takes an occas Immodium, no bleeding, no wt loss, etc... We reviewed the following medical problems during today's office visit >>     Hx AB, pulm fibrosis> on ProventilHFA prn; no resp symptoms & breathing good, & she denies cough, phlegm, SOB, etc; f/u CXR w/o acute changes...    MVP, HBP> on ASA81 & Amlod2.5; BP= 116/62 & 130s/70s at home; stable w/ msc & gr1/6 SEM; no CP, palpit, ch in SOB, edema, etc...    Chol> remains on Lip20 & FLP 5/14 looks great w/ TChol 174, TG 64, HDL 78, LDL 83    HH, GERD> on Prilosec20mg /d + antireflux regimen in view of her HH etc; she denies abd pain, n/v, notes intermit diarrhea, no blood seen...    Hx rectal cancer> s/p lap low anterior resection 11/12; f/u colonoscopy 11/13 by DrGessner- mod divertics, anastomosis ok; she sees DrByerly Q16mo- note 8/14 reviewed still w/ occas loose stool (uses Immodium prn); on [place]; she's avoiding lactose & gas is improved, improved after nutrition consult;  CEA level 6/14 = 3.6    DJD, Osteopenia> on Fosamax70/wk along w/ Ca, MVI, VitD; she is getting along well but hasn't played golf yet...    Other problems as noted... She likes to take a number of supplements... We reviewed prob list, meds, xrays and labs> see below for updates >>            Problem List:   pt's cell # [phone]  ALLERGY (ICD-995.3) - uses OTC meds as needed.  ASTHMATIC BRONCHITIS, ACUTE (ICD-466.0) - uses PROAIR as needed, but doing well, breathing OK. ~  7/09:  AB exac Rx w/ Pred, Avelox, Mucinex, etc... symptoms resolved. ~  CXR: baseline film w/ mild cardiomeg,  scarring, chr changes, HH seen, & NAD.Marland Kitchen. ~  f/u CXR 6/11 & 7/12>  no change... 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 exam showed  bibasilar velcro rales about 1/3rd way up> no change.  HYPERTENSION >> on NORVASC 5mg - 1/2 tab daily... ~  1/13: BP= 160/80 & we decided to start Norvasc5mg /d... subeq cut down to 1/2 tab per phone message. ~  3/13: BP= 154/84 & repeat 124/74; she feels well & denies CP, palpit, SOB, edema, etc; continue same... ~  5/13:  BP= 150/68 & she remains asymptomatic &in good spirits; ok to continue same for now, monitor BP at home. ~  11/13:  BP= 122/70 & she continues to gain strength slowly... ~  11/14: on Amlod5-1/2 daily; BP= 116.62 & she denies CP, palpit, SOB, edema...  MITRAL VALVE PROLAPSE (ICD-424.0) - on ASA 81mg /d... had 2DEcho 2/04 showing MVP, mild MR, normal LVF... StressEcho w/ EF=60%... subseq Cardiolite 12/03 was normal w/o ischemia and EF=65%... denies CP, palpit, dizzy, syncope, edema, etc; she is gradually increasing activities post op... ~  EKG 11/12 showed NSR, rate60, sl incr voltage, NAD...  HYPERCHOLESTEROLEMIA (ICD-272.0) - controlled on LIPITOR 20mg /d,  and tol well...  ~  labs 7/08 showed TChol 183, TG 111, HDL 65, LDL 96... Doing well, continue Lip20. ~  FLP 11/09 showed TChol 189, TG 70, HDL 71, LDL 104 ~  FLP 5/10 showed TChol 173, TG 53, HDL 75, LDL 87... Remains stable on Lip20. ~  FLP 6/11 showed TChol 183, TG 103, HDL 74, LDL 88 ~  FLP 7/12 showed TChol 182, TG 127, HDL 74, LDL 82... Continues stable on Lip20. ~  FLP 5/13 on Lip20 showed TChol 167, TG 79, HDL 82, LDL 69... Continue same. ~  FLP 5/14 on Lip20 showed TChol 174, TG 64, HDL 78, LDL 83  HIATAL HERNIA (ICD-553.3) & GERD (ICD-530.81) - she has a HH seen on CXR and CT Chest... takes OMEPRAZOLE  20mg /d and denies heartburn, GERD, regurg, dysphagia, etc... pt never had EGD or Colonoscopy> she had repeatedly refused to sched a colonoscopy! ~  Q96mo ROVs>  she continues to deny reflux symptoms, abd pain, N/V, etc>  ADENOCARCINOMA of the RECTUM >> Dx 9/12 via colonoscopy for hematochezia & 11/12 underwent laparoscopic assisted low anterior resection by DrByerly [facility] & left ovarian cystectomy (benign) by Samuel Jester; final path = T2 N0 mod diff adenocarcinoma invading into but not through the muscularis, no lymphatic invasion, 12 neg LNs, clear margins... ~  6/13:  She continues to f/u w/ DrByerly; c/o some loose stools, otherw ok... ~  11/13: she had f/u appt w/ DrGessner- Colonoscopy 11/13 showed severe divertics on left, mod on right, clean anastomosis... ~  She continues to f/u w/ DrByerly, CCS> doing satis, just occas bouts of diarrhea 7 uses Immodium as needed...  RECURRENT UTIs>  Routine urinalysis w/ WBC, Bact, Leukocyte+, etc... Treated w/ Cipro as required.  DEGENERATIVE JOINT DISEASE (ICD-715.90) - mild in fingers, no other complaints x "I can't make quick turns anymore"... she notes the Tonic Water Qhs works great for her nightime cramps, & on NEURONTIN 100mg - 2Qhs for ?neuropathy symptoms?  OSTEOPENIA (ICD-733.90) - BMDs here in 2003, 2005, 2007- showed osteopenia/ osteoporosis w/ TScores -1.9 to -2.5.Marland KitchenMarland Kitchen on FOSAMAX, Ca++, Vits... ~  labs 11/09 showed Vit D level = 28... rec to take OTC supplement 1000 u daily. ~  BMD here 7/11 showed TScores -2.3 in Lumbar Spine, and -2.3 in right FemNeck... continue Rx. ~  Labs 5/13 showed Vit D level = 54... rec to continue her 1000u supplement...  ANXIETY (ICD-300.00)  Health Maintenance - GYN= DrHenley & follow up [place] w/ neg exam  she says and stool cards were neg by her report... [facility] 6/10 [facility] was neg... she refuses to consider a colonoscopy... OK 2010 flu vaccine 11/10... had PNEUMOVAX 2009... OK TDAP 6/11...   Past  Surgical History  Procedure Laterality Date  . Nasal sinus surgery  1994  . Hammer toe surgery  1995    Dr. Silverio Decamp  . Appendectomy    . Colonoscopy  11/27/10    rectal cancer, small sigmoid adenoma, diverticulosis  . Vaginal hysterectomy  1980's  . Ectopic pregnancy surgery      Midline incision  . Laparoscopic left oophorectomy, laparoscopic low anterior resection, placement of onq pain pump  02/08/2011  . Colon resection  02/08/2011    Procedure: LAPAROSCOPIC SIGMOID COLON RESECTION;  Surgeon: Stark Klein, MD;  Location: [facility];  Service: General;  Laterality: N/A;  Laparoscopic Lower Anterior Bowel Resection  . Ovarian cyst removal  02/08/2011    Procedure: OVARIAN CYSTECTOMY;  Surgeon: Stark Klein, MD;  Location: [facility];  Service: General;  Laterality: Left;  Removal of Left Ovary and Pelvic Mass  . Colon surgery      Outpatient Encounter Prescriptions as of 02/07/2013  Medication Sig  . albuterol (PROVENTIL HFA;VENTOLIN HFA) 108 (90 BASE) MCG/ACT inhaler Inhale 2 puffs into the lungs every 6 (six) hours as needed. Wheezing   . alendronate (FOSAMAX) 70 MG tablet TAKE 1 TABLET BY MOUTH EVERY 7 DAYS. TAKE WITH A FULL GLASS OF WATER ON AN EMPTY STOMACH  . amLODipine (NORVASC) 5 MG tablet Take 2.5 mg by mouth daily.  Marland Kitchen ascorbic acid (VITAMIN C) 250 MG CHEW Chew 250 mg by mouth daily.   Marland Kitchen aspirin 81 MG tablet Take 81 mg by mouth every morning.   Marland Kitchen atorvastatin (LIPITOR) 20 MG tablet TAKE 1 TABLET (20 MG TOTAL) BY MOUTH AT BEDTIME.  . Calcium Carbonate-Vitamin D (CALCIUM-VITAMIN D) 500-200 MG-UNIT per tablet Take 2 tablets by mouth daily.   . Cholecalciferol (VITAMIN D3) 1000 UNITS CAPS Take 1 capsule by mouth daily.   . ferrous sulfate 325 (65 FE) MG tablet Take 325 mg by mouth daily with breakfast.  . loperamide (LOPERAMIDE A-D) 2 MG tablet Take 1/2 tablet by mouth every other day  . omeprazole (PRILOSEC) 20 MG capsule Take 20 mg by mouth daily.   . vitamin B-12  (CYANOCOBALAMIN) 250 MCG tablet Take 250 mcg by mouth daily.   . vitamin E (VITAMIN E) 200 UNIT capsule Take 200 Units by mouth daily.      Allergies  Allergen Reactions  . Sulfonamide Derivatives Nausea And Vomiting    Happened a long time ago, does not remember the other reaction she had.    Current Medications, Allergies, Past Medical History, Past Surgical History, Family History, and Social History were reviewed in [employer] record.   Review of Systems         See HPI - all other systems neg except as noted...  The patient denies anorexia, fever, weight loss, weight gain, vision loss, decreased hearing, hoarseness, chest pain, syncope, dyspnea on exertion, peripheral edema, prolonged cough, headaches, hemoptysis, abdominal pain, melena, hematochezia, severe indigestion/heartburn, hematuria, incontinence, muscle weakness, suspicious skin lesions, transient blindness, difficulty walking, depression, unusual weight change, abnormal bleeding, enlarged lymph nodes, and angioedema.   Objective:   Physical Exam     WD, WN, 79 y/o WF in NAD... GENERAL:  Alert & oriented; pleasant & cooperative... HEENT:  [place]/AT, Glasses, EOM-wnl, PERRLA, EACs-clear, TMs-wnl, NOSE-clear, THROAT-clear & wnl.  NECK:  Supple w/ fair ROM; no JVD; normal carotid impulses w/o bruits; no thyromegaly or nodules palpated; no lymphadenopathy. CHEST:  few bibasilar dry rales about 1/3rd way up, no wheezing, no rhonchi, no cough, etc... HEART:  Regular Rhythm; without murmurs/ rubs/ or gallops, no msc heard... ABDOMEN:  Surg scar healed, nontender to palp, normal bowel sounds; no organomegaly or masses detected. EXT:  no deformities, mild arthritic changes; no varicose veins/ +venous insuffic/ no edema. NEURO:  CN's intact; no focal neuro deficits... DERM:  No lesions noted; no rash etc...  RADIOLOGY DATA:  Reviewed in the EPIC EMR & discussed w/ the patient...  LABORATORY DATA:  Reviewed  in the EPIC EMR & discussed w/ the patient...   Assessment & Plan:    PULM>  AB, Pulm Fibrosis>  Overall stable on prn Proair; CXR reviewed & resp exam w/o changes, stable.  HBP>  BP improved w/ low dose Amlodipine 2.5mg /d, continue same & monitor at home...  MVP>  She is essent asymptomatic w/o CP, palpit, SOB, edema, etc...  CHOL>  Stable on Lip20 + diet, continue same...  GI> HH, GERD>  Stable on Prilosec, continue same...  RECTAL CANCER>  As above- she is post op 11/12 & had uneventful rehab; slowly improving and diarrhea is diminished w/ Align/ Activia; she will maintain f/u w/ DrByerly & DrGessner ==> f/u colonoscopy 11/13 w/ divertics, no recurrence.  DJD/ Osteopenia>  She remains on Fosamax, Calcium, MVI, Vit D, etc; she will be due for BMD next yr...  UTI>  Prev treated w/ Cipro empirically & currently asymptomatic...  Anxiety>  Stable & she does not require anxiolytic Rx...   Patient's Medications  New Prescriptions   No medications on file  Previous Medications   ALBUTEROL (PROVENTIL HFA;VENTOLIN HFA) 108 (90 BASE) MCG/ACT INHALER    Inhale 2 puffs into the lungs every 6 (six) hours as needed. Wheezing    ALENDRONATE (FOSAMAX) 70 MG TABLET    TAKE 1 TABLET BY MOUTH EVERY 7 DAYS. TAKE WITH A FULL GLASS OF WATER ON AN EMPTY STOMACH   AMLODIPINE (NORVASC) 5 MG TABLET    Take 2.5 mg by mouth daily.   ASCORBIC ACID (VITAMIN C) 250 MG CHEW    Chew 250 mg by mouth daily.    ASPIRIN 81 MG TABLET    Take 81 mg by mouth every morning.    ATORVASTATIN (LIPITOR) 20 MG TABLET    TAKE 1 TABLET (20 MG TOTAL) BY MOUTH AT BEDTIME.   CALCIUM CARBONATE-VITAMIN D (CALCIUM-VITAMIN D) 500-200 MG-UNIT PER TABLET    Take 2 tablets by mouth daily.    CHOLECALCIFEROL (VITAMIN D3) 1000 UNITS CAPS    Take 1 capsule by mouth daily.    FERROUS SULFATE 325 (65 FE) MG TABLET    Take 325 mg by mouth daily with breakfast.   LOPERAMIDE (LOPERAMIDE A-D) 2 MG TABLET    Take 1/2 tablet by mouth every  other day   OMEPRAZOLE (PRILOSEC) 20 MG CAPSULE    Take 20 mg by mouth daily.    VITAMIN B-12 (CYANOCOBALAMIN) 250 MCG TABLET    Take 250 mcg by mouth daily.    VITAMIN E (VITAMIN E) 200 UNIT CAPSULE    Take 200 Units by mouth daily.   Modified Medications   No medications on file  Discontinued Medications   No medications on file

## 2013-04-27 ENCOUNTER — Ambulatory Visit (INDEPENDENT_AMBULATORY_CARE_PROVIDER_SITE_OTHER): Payer: Medicare Other | Admitting: General Surgery

## 2013-04-27 ENCOUNTER — Encounter (INDEPENDENT_AMBULATORY_CARE_PROVIDER_SITE_OTHER): Payer: Self-pay | Admitting: General Surgery

## 2013-04-27 VITALS — BP 158/70 | HR 60 | Resp 16 | Ht 67.5 in | Wt 125.2 lb

## 2013-04-27 DIAGNOSIS — C20 Malignant neoplasm of rectum: Secondary | ICD-10-CM

## 2013-04-27 NOTE — Patient Instructions (Signed)
Get CEA (tumor marker drawn)  Follow up with me in 1 year.

## 2013-04-28 LAB — CEA: CEA: 3.2 ng/mL (ref 0.0–5.0)

## 2013-04-29 NOTE — Progress Notes (Signed)
Quick Note:  Please let pt know tumor marker is normal. ______

## 2013-04-30 ENCOUNTER — Telehealth (INDEPENDENT_AMBULATORY_CARE_PROVIDER_SITE_OTHER): Payer: Self-pay

## 2013-04-30 NOTE — Telephone Encounter (Signed)
Pt made aware that her CEA was normal.

## 2013-05-01 NOTE — Assessment & Plan Note (Signed)
Patient continues to do well.  We've discussed multiple things to try to help control her bowels.  It seems as though a lot of her issues may be related to anxiety. She is taking occasional Imodium for diarrhea. I advised her to continue this.

## 2013-05-01 NOTE — Progress Notes (Signed)
HISTORY: Pt now around 2-1/2 years s/p lap LAR for rectal cancer.  She had a colonoscopy in November 2013 with Dr. Carlean Purl and no polyps were seen. Patient states that her energy level has continued to improve. She does still have issues with fecal urgency. She denies any blood in her bowel movements. She has not change in her stool caliber. It did note on her colonoscopy in 2013 she had a small rectal vault. This may be related as well to her urgency. She has not developed any new health problems. She continues to do fine from the standpoint of her incision. She would like to go back to the golf range this year.     PERTINENT REVIEW OF SYSTEMS: Otherwise negative.     EXAM: Head: Normocephalic and atraumatic.  Eyes:  Conjunctivae are normal. Pupils are equal, round, and reactive to light. No scleral icterus.  Neck:  Normal range of motion. Neck supple. No tracheal deviation present. No thyromegaly present.  Resp: No respiratory distress, normal effort. Abd:  Abdomen is soft, non distended and non tender. No masses are palpable.  There is no rebound and no guarding. No evidence of hernia.   Rectal exam:  Stool in vault.  No palpable masses.  Staple line appreciated.  No nodularity. Neurological: Alert and oriented to person, place, and time. Coordination normal.  Skin: Skin is warm and dry. No rash noted. No diaphoretic. No erythema. No pallor.  Psychiatric: Normal mood and affect. Normal behavior. Judgment and thought content normal.      ASSESSMENT AND PLAN:   pT2pN0 (0/12 lymph nodes) cM0 Rectal Cancer Patient continues to do well.  We've discussed multiple things to try to help control her bowels.  It seems as though a lot of her issues may be related to anxiety. She is taking occasional Imodium for diarrhea. I advised her to continue this.       Milus Height, MD Surgical Oncology, [facility]  Noralee Space, MD Noralee Space, MD

## 2013-06-01 ENCOUNTER — Other Ambulatory Visit: Payer: Self-pay | Admitting: Pulmonary Disease

## 2013-08-15 ENCOUNTER — Ambulatory Visit (INDEPENDENT_AMBULATORY_CARE_PROVIDER_SITE_OTHER): Payer: Medicare Other | Admitting: Pulmonary Disease

## 2013-08-15 ENCOUNTER — Encounter: Payer: Self-pay | Admitting: Pulmonary Disease

## 2013-08-15 ENCOUNTER — Ambulatory Visit (INDEPENDENT_AMBULATORY_CARE_PROVIDER_SITE_OTHER): Payer: Medicare Other

## 2013-08-15 VITALS — BP 124/70 | HR 56 | Temp 97.6°F | Ht 66.5 in | Wt 130.0 lb

## 2013-08-15 DIAGNOSIS — K5730 Diverticulosis of large intestine without perforation or abscess without bleeding: Secondary | ICD-10-CM | POA: Diagnosis not present

## 2013-08-15 DIAGNOSIS — F411 Generalized anxiety disorder: Secondary | ICD-10-CM | POA: Diagnosis not present

## 2013-08-15 DIAGNOSIS — M949 Disorder of cartilage, unspecified: Secondary | ICD-10-CM | POA: Diagnosis not present

## 2013-08-15 DIAGNOSIS — M899 Disorder of bone, unspecified: Secondary | ICD-10-CM

## 2013-08-15 DIAGNOSIS — C20 Malignant neoplasm of rectum: Secondary | ICD-10-CM

## 2013-08-15 DIAGNOSIS — I059 Rheumatic mitral valve disease, unspecified: Secondary | ICD-10-CM

## 2013-08-15 DIAGNOSIS — J8410 Pulmonary fibrosis, unspecified: Secondary | ICD-10-CM

## 2013-08-15 DIAGNOSIS — I10 Essential (primary) hypertension: Secondary | ICD-10-CM | POA: Diagnosis not present

## 2013-08-15 DIAGNOSIS — M1990 Unspecified osteoarthritis, unspecified site: Secondary | ICD-10-CM

## 2013-08-15 DIAGNOSIS — E7800 Pure hypercholesterolemia, unspecified: Secondary | ICD-10-CM

## 2013-08-15 DIAGNOSIS — K219 Gastro-esophageal reflux disease without esophagitis: Secondary | ICD-10-CM

## 2013-08-15 LAB — BASIC METABOLIC PANEL
BUN: 21 mg/dL (ref 6–23)
CHLORIDE: 105 meq/L (ref 96–112)
CO2: 29 mEq/L (ref 19–32)
Calcium: 9.4 mg/dL (ref 8.4–10.5)
Creatinine, Ser: 0.9 mg/dL (ref 0.4–1.2)
GFR: 61.89 mL/min (ref >60.00)
GLUCOSE: 93 mg/dL (ref 70–99)
POTASSIUM: 4.6 meq/L (ref 3.5–5.1)
Sodium: 143 mEq/L (ref 135–145)

## 2013-08-15 LAB — CBC WITH DIFFERENTIAL/PLATELET
BASOS ABS: 0 10*3/uL (ref 0.0–0.1)
Basophils Relative: 0.3 % (ref 0.0–3.0)
Eosinophils Absolute: 0.1 10*3/uL (ref 0.0–0.7)
Eosinophils Relative: 2 % (ref 0.0–5.0)
HEMATOCRIT: 37.3 % (ref 36.0–46.0)
Hemoglobin: 12.6 g/dL (ref 12.0–15.0)
Lymphocytes Relative: 19.5 % (ref 12.0–46.0)
Lymphs Abs: 1.2 10*3/uL (ref 0.7–4.0)
MCHC: 33.6 g/dL (ref 30.0–36.0)
MCV: 98.7 fl (ref 78.0–100.0)
MONO ABS: 0.4 10*3/uL (ref 0.1–1.0)
MONOS PCT: 6.2 % (ref 3.0–12.0)
Neutro Abs: 4.4 10*3/uL (ref 1.4–7.7)
Neutrophils Relative %: 72 % (ref 43.0–77.0)
PLATELETS: 212 10*3/uL (ref 150.0–400.0)
RBC: 3.78 Mil/uL — ABNORMAL LOW (ref 3.87–5.11)
RDW: 13.7 % (ref 11.5–15.5)
WBC: 6.1 10*3/uL (ref 4.0–10.5)

## 2013-08-15 LAB — LIPID PANEL
CHOLESTEROL: 184 mg/dL (ref 0–200)
HDL: 83.3 mg/dL (ref >39.00)
LDL Cholesterol: 84 mg/dL (ref 0–99)
Total CHOL/HDL Ratio: 2
Triglycerides: 84 mg/dL (ref 0.0–149.0)
VLDL: 16.8 mg/dL (ref 0.0–40.0)

## 2013-08-15 LAB — URINALYSIS, ROUTINE W REFLEX MICROSCOPIC
Bilirubin Urine: NEGATIVE
HGB URINE DIPSTICK: NEGATIVE
KETONES UR: NEGATIVE
Nitrite: NEGATIVE
Specific Gravity, Urine: 1.015 (ref 1.000–1.030)
Total Protein, Urine: NEGATIVE
URINE GLUCOSE: NEGATIVE
UROBILINOGEN UA: 0.2 (ref 0.0–1.0)
pH: 6 (ref 5.0–8.0)

## 2013-08-15 LAB — HEPATIC FUNCTION PANEL
ALBUMIN: 4.1 g/dL (ref 3.5–5.2)
ALT: 8 U/L (ref 0–35)
AST: 19 U/L (ref 0–37)
Alkaline Phosphatase: 49 U/L (ref 39–117)
Bilirubin, Direct: 0.1 mg/dL (ref 0.0–0.3)
Total Bilirubin: 0.6 mg/dL (ref 0.2–1.2)
Total Protein: 7.8 g/dL (ref 6.0–8.3)

## 2013-08-15 LAB — TSH: TSH: 2.41 u[IU]/mL (ref 0.35–4.50)

## 2013-08-15 MED ORDER — ATORVASTATIN CALCIUM 20 MG PO TABS: ORAL_TABLET | ORAL | Status: DC

## 2013-08-15 NOTE — Progress Notes (Addendum)
Subjective:    Patient ID: Hannah Mercado, female    DOB: 12/05/1922, 81 y.o.   MRN: [id number]  HPI 80 y/o WF here for a follow up visit...  ~  March 7,2013:  6wk ROV & she continues to improve, gaining strength, & looks fabulous;  C/o BP variations but she only checks it periodically at the Pharm> numbers read from 98/62 to 180/70;  Hannah Mercado is 120-140/70-80 & she is asymptomatic;  We had cut her Amlodipine5mg  to 1/2 daily & looks good on this dose- continue same...    Hx AB, pulm fibrosis> no resp symptoms & breathing good, getting back into her activities, etc...    MVP> stable w/ msc & gr1/6 SEM; no CP, palpit, ch in SOB, edema, etc...    Chol> remains on Lip20 & we discussed f/u FLP on return...    HH, GERD> on Prilosec20mg /d + antireflux regimen in view of her HH etc...    Hx rectal cancer> still w/ loose stool but not diarrhea; on Align & Activia; she's avoiding lactose & gas is improved, wants nutrition consult- ok (saw MaggieMay)...    Other problems as noted...  ~  Aug 03, 2011:  36mo ROV & overall Hannah Mercado is improved;  BP controlled on sm dose of Amlodipine;  Chol looks great on Lip20;  GI symptoms improved;  We reviewed prob list, meds, xrays, & labs>  see below>>  LABS 5/13:  FLP- all parameters at goals on Lip20;  Chems- wnl;  CBC- wnl & Iron is ok;  TSH=3.28;  VitD=54...  ~  February 03, 2012:  50mo ROV & Hannah Mercado has been doing satis- she has f/u colonoscopy planned for 02/14/12...    Hx AB, pulm fibrosis> on ProventilHFA prn; no resp symptoms & breathing good, & she denies cough, phlegm, SOB, etc; f/u CXR w/o acute changes...    MVP, HBP> on ASA81 & Amlod2.5; BP= 122/70 & 130s/70s at home; stable w/ msc & gr1/6 SEM; no CP, palpit, ch in SOB, edema, etc...    Chol> remains on Lip20 & FLP 5/13 looked great...    HH, GERD> on Prilosec20mg /d + antireflux regimen in view of her [place] etc; she denies abd pain, n/v, notes intermit diarrhea, no blood seen...    Hx rectal cancer> still w/ loose  stool but not diarrhea (uses Immodium prn); on Align & Activia; she's avoiding lactose & gas is improved, improved after nutrition consult.     DJD, Osteopenia> on Fosamax70/wk along w/ Ca, MVI, VitD; she is getting along well but hasn't played golf yet...    Other problems as noted... She likes to take a number of supplements... We reviewed prob list, meds, xrays and labs> see below for updates >> she will get the 2013 Flu vaccine at [facility] soon...  CXR 11/13 showed borderline cardiomeg, ectasia of the Ao, chronic lung changes/ NAD, DJD/ spondylosis...  LABS 11/13:  Chems- wnl;  CBC- wnl;  UA- ok...   ~  Aug 02, 2012:  100mo ROV & Hannah Mercado is doing satis at 6, notes sl decr energy still but hit some golf balls recently & plans to play 9 holes soon on a cool day... We reviewed the following medical problems during today's office visit >>     Hx AB, pulm fibrosis> on ProventilHFA prn; no resp symptoms & breathing good, & she denies cough, phlegm, SOB, etc; f/u CXR w/o acute changes...    MVP, HBP> on ASA81 & Amlod2.5; BP= 122/64 & 130s/70s at home; stable  w/ msc & gr1/6 SEM; no CP, palpit, ch in SOB, edema, etc...    Chol> remains on Lip20 & FLP 5/14 looks great w/ TChol 174, TG 64, HDL 78, LDL 83    HH, GERD> on Prilosec20mg /d + antireflux regimen in view of her HH etc; she denies abd pain, n/v, notes intermit diarrhea, no blood seen...    Hx rectal cancer> s/p lap low anterior resection 11/12; f/u colonoscopy 11/13 by DrGessner- mod divertics, anastomosis ok; she sees DrByerly Q78mo- note 1/14 reviewed still w/ loose stool but not diarrhea (uses Immodium prn); on [place]; she's avoiding lactose & gas is improved, improved after nutrition consult.     DJD, Osteopenia> on Fosamax70/wk along w/ Ca, MVI, VitD; she is getting along well but hasn't played golf yet...    Other problems as noted... She likes to take a number of supplements... We reviewed prob list, meds, xrays and labs> see below for  updates >>   LABS 5/14:  FLP- at goals on Lip20;  Chems- wnl;  CBC- wnl;  TSH=2.61;  VitD=56;  UA- few wbc, no bact...  ~  February 07, 2013:  69mo ROV & Hannah Mercado is doing well- great attitude & no new complaints or concerns;  She had f/u visit w/ DrByerly 8/14> 72yrs s/p rectal cancer surg, last colon 11/13 by DrGessner was neg, still gets occas bouts of diarrhea & some urgency; she takes an occas Immodium, no bleeding, no wt loss, etc... We reviewed the following medical problems during today's office visit >>     Hx AB, pulm fibrosis> on ProventilHFA prn; no resp symptoms & breathing good, & she denies cough, phlegm, SOB, etc; f/u CXR w/o acute changes...    MVP, HBP> on ASA81 & Amlod2.5; BP= 116/62 & 130s/70s at home; stable w/ msc & gr1/6 SEM; no CP, palpit, ch in SOB, edema, etc...    Chol> remains on Lip20 & FLP 5/14 looks great w/ TChol 174, TG 64, HDL 78, LDL 83    HH, GERD> on Prilosec20mg /d + antireflux regimen in view of her HH etc; she denies abd pain, n/v, notes intermit diarrhea, no blood seen...    Hx rectal cancer> s/p lap low anterior resection 11/12; f/u colonoscopy 11/13 by DrGessner- mod divertics, anastomosis ok; she sees DrByerly Q61mo- note 8/14 reviewed still w/ occas loose stool (uses Immodium prn); on [place]; she's avoiding lactose & gas is improved, improved after nutrition consult;  CEA level 6/14 = 3.6    DJD, Osteopenia> on Fosamax70/wk along w/ Ca, MVI, VitD; she is getting along well but hasn't played golf yet...    Other problems as noted... She likes to take a number of supplements... We reviewed prob list, meds, xrays and labs> see below for updates >>   ~  Aug 15, 2013:  31mo ROV & Hannah Mercado continues to do well overall- her CC is intermittent diarrhea & she takes prn Immodium OTC... We reviewed the following medical problems during today's office visit >>     Hx AB, pulm fibrosis> on ProventilHFA prn; no resp symptoms & breathing good, & she denies cough, phlegm,  SOB, etc; f/u CXR w/o acute changes...    MVP, HBP> on ASA81 & Amlod2.5; BP= 124/70 & similar at home; stable w/ msc & gr1/6 SEM; no CP, palpit, ch in SOB, edema, etc...    Chol> remains on Lip20 & FLP 5/15 looks great w/ TChol 184, TG 84, HDL 83, LDL 84  HH, GERD> on Prilosec20mg /d + antireflux regimen in view of her [place] etc; she denies abd pain, n/v, notes intermit diarrhea, no blood seen...    Hx rectal cancer> s/p lap low anterior resection 11/12 by DrByerly; f/u colonoscopy 11/13 by DrGessner- mod divertics, anastomosis ok; she sees DrByerly Q77mo- note 8/14 reviewed still w/ occas loose stool (uses Immodium prn); on Align & Activia; she's avoiding lactose & gas is improved, improved after nutrition consult;  CEA level 2/15 = 3.2 (stable).    DJD, Osteopenia> on Fosamax70/wk along w/ Ca, MVI, VitD; she is getting along well but hasn't played much golf since her surg...    Other problems as noted... She likes to take a number of supplements... We reviewed prob list, meds, xrays and labs> see below for updates >>   CXR => done 6/15>  Borderline cardiomeg, prominent PAs, calcif Ao arch, COPD w/ sl incr markings/ NAD, DJD in Tspine...  LABS 5/15:  FLP- within parameters on Lip20;  Chems- wnl;  CBC- ok w/ Hg=12.6;  TSH=2.41;  VitD=55 on 1000u daily...  BMD => done 6/15>  Lowest Tscore is -2.6 in Lumbar spine; she is rec to continue ALENDRONATE 70mg /wk & calcium/ MVI/ VitD supplements...           Problem List:   pt's cell # [id number]  ALLERGY (ICD-995.3) - uses OTC meds as needed.  ASTHMATIC BRONCHITIS, ACUTE (ICD-466.0) - uses PROAIR as needed, but doing well, breathing OK. ~  7/09:  AB exac Rx w/ Pred, Avelox, Mucinex, etc... symptoms resolved. ~  CXR: baseline film w/ mild cardiomeg, scarring, chr changes, HH seen, & NAD.Marland Kitchen. ~  f/u CXR 6/11 & 7/12>  no change... 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 exam showed bibasilar velcro rales about 1/3rd way up> no change.  HYPERTENSION >> on NORVASC 5mg - 1/2 tab daily... ~  1/13: BP= 160/80 & we decided to start Norvasc5mg /d... subeq cut down to 1/2 tab per phone message. ~  3/13: BP= 154/84 & repeat 124/74; she feels well & denies CP, palpit, SOB, edema, etc; continue same... ~  5/13:  BP= 150/68 & she remains asymptomatic &in good spirits; ok to continue same for now, monitor BP at home. ~  11/13:  BP= 122/70 & she continues to gain strength slowly... ~  11/14: on Amlod5-1/2 daily; BP= 116.62 & she denies CP, palpit, SOB, edema... ~  5/15: on ASA81 & Amlod2.5; BP= 124/70 & similar at home; stable w/ msc & gr1/6 SEM; no CP, palpit, ch in SOB, edema, etc  MITRAL VALVE PROLAPSE (ICD-424.0) - on ASA 81mg /d... had 2DEcho 2/04 showing MVP, mild MR, normal LVF... StressEcho w/ EF=60%... subseq Cardiolite 12/03 was normal w/o ischemia and EF=65%... denies CP, palpit, dizzy, syncope, edema, etc; she is gradually increasing activities post op... ~  EKG 11/12 showed NSR, rate60, sl incr voltage, NAD...  HYPERCHOLESTEROLEMIA (ICD-272.0) - controlled on LIPITOR 20mg /d,  and tol well...  ~  labs 7/08 showed TChol 183, TG 111, HDL 65, LDL 96... Doing well, continue Lip20. ~  FLP 11/09 showed TChol 189, TG 70, HDL 71, LDL 104 ~  FLP 5/10 showed TChol 173, TG 53, HDL 75, LDL 87... Remains stable on Lip20. ~  FLP 6/11 showed TChol 183, TG 103, HDL 74, LDL 88 ~  FLP 7/12 showed TChol 182, TG 127, HDL 74, LDL 82... Continues stable on Lip20. ~  FLP 5/13 on Lip20 showed TChol 167, TG 79, HDL 82, LDL 69... Continue same. ~  FLP 5/14 on Lip20 showed TChol 174, TG 64, HDL 78, LDL 83 ~  FLP 5/15 on Lip20 showed TChol 184, TG 84, HDL 83, LDL 84  HIATAL HERNIA (ICD-553.3) & GERD (ICD-530.81) - she has a HH seen on CXR and CT Chest... takes OMEPRAZOLE 20mg /d and denies  heartburn, GERD, regurg, dysphagia, etc... pt never had EGD or Colonoscopy> she had repeatedly refused to sched a colonoscopy! ~  Q28mo ROVs>  she continues to deny reflux symptoms, abd pain, N/V, etc>  ADENOCARCINOMA of the RECTUM >> Dx 9/12 via colonoscopy for hematochezia & 11/12 underwent laparoscopic assisted low anterior resection by DrByerly [facility] & left ovarian cystectomy (benign) by Samuel Jester; final path = T2 N0 mod diff adenocarcinoma invading into but not through the muscularis, no lymphatic invasion, 12 neg LNs, clear margins... ~  6/13:  She continues to f/u w/ DrByerly; c/o some loose stools, otherw ok... ~  11/13: she had f/u appt w/ DrGessner- Colonoscopy 11/13 showed severe divertics on left, mod on right, clean anastomosis... ~  She continues to f/u w/ DrByerly, [facility] (last visit 2/15)> doing satis, just occas bouts of diarrhea & uses Immodium as needed...  RECURRENT UTIs>  Routine urinalysis w/ WBC, Bact, Leukocyte+, etc... Treated w/ Cipro as required.  DEGENERATIVE JOINT DISEASE (ICD-715.90) - mild in fingers, no other complaints x "I can't make quick turns anymore"... she notes the Tonic Water Qhs works great for her nightime cramps, & on NEURONTIN 100mg - 2Qhs for ?neuropathy symptoms?  OSTEOPENIA (ICD-733.90) - BMDs here in 2003, 2005, 2007- showed osteopenia/ osteoporosis w/ TScores -1.9 to -2.5.Marland KitchenMarland Kitchen on FOSAMAX, Ca++, Vits... ~  labs 11/09 showed Vit D level = 28... rec to take OTC supplement 1000 u daily. ~  BMD here 7/11 showed TScores -2.3 in Lumbar Spine, and -2.3 in right FemNeck... continue Rx. ~  Labs 5/13 showed Vit D level = 54... rec to continue her 1000u supplement...  ANXIETY (ICD-300.00)  Health Maintenance - GYN= DrHenley & follow up [place] w/ neg exam she says and stool cards were neg by her report... last Mamm[facility] 6/10 at [facility] was neg... she refuses to consider a colonoscopy... OK 2010 flu vaccine 11/10... had PNEUMOVAX 2009... OK TDAP 6/11...   Past  Surgical History  Procedure Laterality Date  . Nasal sinus surgery  1994  . Hammer toe surgery  1995    Dr. Silverio Decamp  . Appendectomy    . Colonoscopy  11/27/10    rectal cancer, small sigmoid adenoma, diverticulosis  . Vaginal hysterectomy  1980's  . Ectopic pregnancy surgery      Midline incision  . Laparoscopic left oophorectomy, laparoscopic low anterior resection, placement of onq pain pump  02/08/2011  . Colon resection  02/08/2011    Procedure: LAPAROSCOPIC SIGMOID COLON RESECTION;  Surgeon: Stark Klein, MD;  Location: [facility];  Service: General;  Laterality: N/A;  Laparoscopic Lower Anterior Bowel Resection  . Ovarian cyst removal  02/08/2011    Procedure: OVARIAN CYSTECTOMY;  Surgeon: Stark Klein, MD;  Location: [facility];  Service: General;  Laterality: Left;  Removal of Left Ovary and Pelvic Mass  . Colon surgery      Outpatient Encounter Prescriptions as of 08/15/2013  Medication Sig  . albuterol (PROVENTIL HFA;VENTOLIN HFA) 108 (90 BASE) MCG/ACT inhaler Inhale 2 puffs into the  lungs every 6 (six) hours as needed. Wheezing   . alendronate (FOSAMAX) 70 MG tablet TAKE 1 TABLET BY MOUTH EVERY 7 DAYS. TAKE WITH A FULL GLASS OF WATER ON AN EMPTY STOMACH  . amLODipine (NORVASC) 5 MG tablet Take 2.5 mg by mouth daily.  Marland Kitchen ascorbic acid (VITAMIN C) 250 MG CHEW Chew 250 mg by mouth daily.   Marland Kitchen aspirin 81 MG tablet Take 81 mg by mouth every morning.   Marland Kitchen atorvastatin (LIPITOR) 20 MG tablet TAKE 1 TABLET (20 MG TOTAL) BY MOUTH AT BEDTIME.  . Calcium Carbonate-Vitamin D (CALCIUM-VITAMIN D) 500-200 MG-UNIT per tablet Take 2 tablets by mouth daily.   . Cholecalciferol (VITAMIN D3) 1000 UNITS CAPS Take 1 capsule by mouth daily.   . ferrous sulfate 325 (65 FE) MG tablet Take 325 mg by mouth daily with breakfast.  . loperamide (LOPERAMIDE A-D) 2 MG tablet Take 1/2 tablet by mouth every other day  . omeprazole (PRILOSEC) 20 MG capsule Take 20 mg by mouth daily.   . vitamin B-12  (CYANOCOBALAMIN) 250 MCG tablet Take 250 mcg by mouth daily.   . vitamin E (VITAMIN E) 200 UNIT capsule Take 200 Units by mouth daily.     Allergies  Allergen Reactions  . Sulfonamide Derivatives Nausea And Vomiting    Happened a long time ago, does not remember the other reaction she had.    Current Medications, Allergies, Past Medical History, Past Surgical History, Family History, and Social History were reviewed in [employer] record.   Review of Systems         See HPI - all other systems neg except as noted...  The patient denies anorexia, fever, weight loss, weight gain, vision loss, decreased hearing, hoarseness, chest pain, syncope, dyspnea on exertion, peripheral edema, prolonged cough, headaches, hemoptysis, abdominal pain, melena, hematochezia, severe indigestion/heartburn, hematuria, incontinence, muscle weakness, suspicious skin lesions, transient blindness, difficulty walking, depression, unusual weight change, abnormal bleeding, enlarged lymph nodes, and angioedema.   Objective:   Physical Exam     WD, WN, 80 y/o WF in NAD... GENERAL:  Alert & oriented; pleasant & cooperative... HEENT:  [place]/AT, Glasses, EOM-wnl, PERRLA, EACs-clear, TMs-wnl, NOSE-clear, THROAT-clear & wnl. NECK:  Supple w/ fair ROM; no JVD; normal carotid impulses w/o bruits; no thyromegaly or nodules palpated; no lymphadenopathy. CHEST:  few bibasilar dry rales about 1/3rd way up, no wheezing, no rhonchi, no cough, etc... HEART:  Regular Rhythm; without murmurs/ rubs/ or gallops, no msc heard... ABDOMEN:  Surg scar healed, nontender to palp, normal bowel sounds; no organomegaly or masses detected. EXT:  no deformities, mild arthritic changes; no varicose veins/ +venous insuffic/ no edema. NEURO:  CN's intact; no focal neuro deficits... DERM:  No lesions noted; no rash etc...  RADIOLOGY DATA:  Reviewed in the EPIC EMR & discussed w/ the patient...  LABORATORY DATA:  Reviewed in  the EPIC EMR & discussed w/ the patient...   Assessment & Plan:    PULM>  AB, Pulm Fibrosis>  Overall stable on prn Proair; CXR reviewed & resp exam w/o changes, stable.  HBP>  BP improved w/ low dose Amlodipine 2.5mg /d, continue same & monitor at home...  MVP>  She is essent asymptomatic w/o CP, palpit, SOB, edema, etc...  CHOL>  Stable on Lip20 + diet, continue same...  GI> HH, GERD>  Stable on Prilosec, continue same...  RECTAL CANCER>  As above- she is post op 11/12 & had uneventful rehab; slowly improving and diarrhea is diminished w/  Align/ Activia; she will maintain f/u w/ DrByerly & DrGessner ==> f/u colonoscopy 11/13 w/ divertics, no recurrence.  DJD/ Osteopenia>  She remains on Fosamax, Calcium, MVI, Vit D, etc; she will be due for BMD next yr...  UTI>  Prev treated w/ Cipro empirically & currently asymptomatic...  Anxiety>  Stable & she does not require anxiolytic Rx...   Patient's Medications  New Prescriptions   No medications on file  Previous Medications   ALBUTEROL (PROVENTIL HFA;VENTOLIN HFA) 108 (90 BASE) MCG/ACT INHALER    Inhale 2 puffs into the lungs every 6 (six) hours as needed. Wheezing    ALENDRONATE (FOSAMAX) 70 MG TABLET    TAKE 1 TABLET BY MOUTH EVERY 7 DAYS. TAKE WITH A FULL GLASS OF WATER ON AN EMPTY STOMACH   AMLODIPINE (NORVASC) 5 MG TABLET    Take 2.5 mg by mouth daily.   ASCORBIC ACID (VITAMIN C) 250 MG CHEW    Chew 250 mg by mouth daily.    ASPIRIN 81 MG TABLET    Take 81 mg by mouth every morning.    CALCIUM CARBONATE-VITAMIN D (CALCIUM-VITAMIN D) 500-200 MG-UNIT PER TABLET    Take 2 tablets by mouth daily.    CHOLECALCIFEROL (VITAMIN D3) 1000 UNITS CAPS    Take 1 capsule by mouth daily.    FERROUS SULFATE 325 (65 FE) MG TABLET    Take 325 mg by mouth daily with breakfast.   LOPERAMIDE (LOPERAMIDE A-D) 2 MG TABLET    Take 1/2 tablet by mouth every other day   OMEPRAZOLE (PRILOSEC) 20 MG CAPSULE    Take 20 mg by mouth daily.    VITAMIN B-12  (CYANOCOBALAMIN) 250 MCG TABLET    Take 250 mcg by mouth daily.    VITAMIN E (VITAMIN E) 200 UNIT CAPSULE    Take 200 Units by mouth daily.   Modified Medications   Modified Medication Previous Medication   ATORVASTATIN (LIPITOR) 20 MG TABLET atorvastatin (LIPITOR) 20 MG tablet      TAKE 1 TABLET (20 MG TOTAL) BY MOUTH AT BEDTIME.    TAKE 1 TABLET (20 MG TOTAL) BY MOUTH AT BEDTIME.  Discontinued Medications   No medications on file

## 2013-08-15 NOTE — Patient Instructions (Signed)
Today we updated your med list in our EPIC system...    Continue your current medications the same...  Try a probiotic like ALIGN daily and add in the [place] too...  Today we did your follow up CXR & FASTING blood work...    We will contact you w/ the results when available...   We will sched a bone denity test...  Stay as active as possible...  Call for any questions...  Let's plan a follow up visit in 54mo, sooner if needed for problems.Marland KitchenMarland Kitchen

## 2013-08-16 LAB — VITAMIN D 25 HYDROXY (VIT D DEFICIENCY, FRACTURES): Vit D, 25-Hydroxy: 55 ng/mL (ref 30–89)

## 2013-08-22 ENCOUNTER — Ambulatory Visit (INDEPENDENT_AMBULATORY_CARE_PROVIDER_SITE_OTHER)
Admission: RE | Admit: 2013-08-22 | Discharge: 2013-08-22 | Disposition: A | Discharge status: Home / Self Care | Payer: Medicare Other | Source: Ambulatory Visit | Attending: Pulmonary Disease | Admitting: Pulmonary Disease

## 2013-08-22 DIAGNOSIS — M899 Disorder of bone, unspecified: Secondary | ICD-10-CM | POA: Diagnosis not present

## 2013-08-22 DIAGNOSIS — M949 Disorder of cartilage, unspecified: Principal | ICD-10-CM

## 2013-08-23 ENCOUNTER — Ambulatory Visit (INDEPENDENT_AMBULATORY_CARE_PROVIDER_SITE_OTHER)
Admission: RE | Admit: 2013-08-23 | Discharge: 2013-08-23 | Disposition: A | Discharge status: Home / Self Care | Payer: Medicare Other | Source: Ambulatory Visit | Attending: Pulmonary Disease | Admitting: Pulmonary Disease

## 2013-08-23 DIAGNOSIS — J8410 Pulmonary fibrosis, unspecified: Secondary | ICD-10-CM

## 2013-08-23 DIAGNOSIS — R918 Other nonspecific abnormal finding of lung field: Secondary | ICD-10-CM | POA: Diagnosis not present

## 2013-09-19 DIAGNOSIS — R928 Other abnormal and inconclusive findings on diagnostic imaging of breast: Secondary | ICD-10-CM | POA: Diagnosis not present

## 2013-10-03 ENCOUNTER — Other Ambulatory Visit: Payer: Self-pay | Admitting: Pulmonary Disease

## 2013-10-10 ENCOUNTER — Encounter: Payer: Self-pay | Admitting: Pulmonary Disease

## 2013-10-24 DIAGNOSIS — H35369 Drusen (degenerative) of macula, unspecified eye: Secondary | ICD-10-CM | POA: Diagnosis not present

## 2013-10-24 DIAGNOSIS — H26499 Other secondary cataract, unspecified eye: Secondary | ICD-10-CM | POA: Diagnosis not present

## 2014-01-24 DIAGNOSIS — Z23 Encounter for immunization: Secondary | ICD-10-CM | POA: Diagnosis not present

## 2014-02-25 ENCOUNTER — Ambulatory Visit: Payer: Medicare Other | Admitting: Pulmonary Disease

## 2014-02-25 ENCOUNTER — Ambulatory Visit (INDEPENDENT_AMBULATORY_CARE_PROVIDER_SITE_OTHER): Payer: Medicare Other | Admitting: Pulmonary Disease

## 2014-02-25 ENCOUNTER — Encounter: Payer: Self-pay | Admitting: Pulmonary Disease

## 2014-02-25 VITALS — BP 122/62 | HR 62 | Temp 97.8°F | Ht 66.5 in | Wt 124.0 lb

## 2014-02-25 DIAGNOSIS — E7800 Pure hypercholesterolemia, unspecified: Secondary | ICD-10-CM

## 2014-02-25 DIAGNOSIS — M159 Polyosteoarthritis, unspecified: Secondary | ICD-10-CM

## 2014-02-25 DIAGNOSIS — Z23 Encounter for immunization: Secondary | ICD-10-CM | POA: Diagnosis not present

## 2014-02-25 DIAGNOSIS — K449 Diaphragmatic hernia without obstruction or gangrene: Secondary | ICD-10-CM | POA: Diagnosis not present

## 2014-02-25 DIAGNOSIS — F411 Generalized anxiety disorder: Secondary | ICD-10-CM

## 2014-02-25 DIAGNOSIS — M899 Disorder of bone, unspecified: Secondary | ICD-10-CM

## 2014-02-25 DIAGNOSIS — I10 Essential (primary) hypertension: Secondary | ICD-10-CM

## 2014-02-25 DIAGNOSIS — C20 Malignant neoplasm of rectum: Secondary | ICD-10-CM | POA: Diagnosis not present

## 2014-02-25 DIAGNOSIS — J8410 Pulmonary fibrosis, unspecified: Secondary | ICD-10-CM

## 2014-02-25 DIAGNOSIS — I059 Rheumatic mitral valve disease, unspecified: Secondary | ICD-10-CM

## 2014-02-25 DIAGNOSIS — K219 Gastro-esophageal reflux disease without esophagitis: Secondary | ICD-10-CM

## 2014-02-25 DIAGNOSIS — M150 Primary generalized (osteo)arthritis: Secondary | ICD-10-CM

## 2014-02-25 DIAGNOSIS — M949 Disorder of cartilage, unspecified: Secondary | ICD-10-CM

## 2014-02-25 MED ORDER — ATORVASTATIN CALCIUM 20 MG PO TABS: ORAL_TABLET | ORAL | Status: DC

## 2014-02-25 NOTE — Patient Instructions (Signed)
Today we updated your med list in our EPIC system...    Continue your current medications the same...  We refilled yourmeds per request...  Today we gave you the 2nd pneumonia vaccine called PREVNAR-13 (one & done)...  Try some nutritional supplements between meals to try to gain weight...  Call for any questions...  Let's plan a follow up visit in 57mo, sooner if needed for problems.Marland KitchenMarland Kitchen

## 2014-02-25 NOTE — Progress Notes (Signed)
Subjective:    Patient ID: Hannah Mercado, female    DOB: May 08, 1922, 80 y.o.   MRN: [id number]  HPI 80 y/o WF here for a follow up visit... ~  SEE PREV EPIC NOTES FOR OLDER DATA >>   ~  February 03, 2012:  21mo ROV & Hannah Mercado has been doing satis- she has f/u colonoscopy planned for 02/14/12...    Hx AB, pulm fibrosis> on ProventilHFA prn; no resp symptoms & breathing good, & she denies cough, phlegm, SOB, etc; f/u CXR w/o acute changes...    MVP, HBP> on ASA81 & Amlod2.5; BP= 122/70 & 130s/70s at home; stable w/ msc & gr1/6 SEM; no CP, palpit, ch in SOB, edema, etc...    Chol> remains on Lip20 & FLP 5/13 looked great...    HH, GERD> on Prilosec20mg /d + antireflux regimen in view of her [place] etc; she denies abd pain, n/v, notes intermit diarrhea, no blood seen...    Hx rectal cancer> still w/ loose stool but not diarrhea (uses Immodium prn); on Align & Activia; she's avoiding lactose & gas is improved, improved after nutrition consult.     DJD, Osteopenia> on Fosamax70/wk along w/ Ca, MVI, VitD; she is getting along well but hasn't played golf yet...    Other problems as noted... She likes to take a number of supplements... We reviewed prob list, meds, xrays and labs> see below for updates >> she will get the 2013 Flu vaccine at [facility] soon...  CXR 11/13 showed borderline cardiomeg, ectasia of the Ao, chronic lung changes/ NAD, DJD/ spondylosis...  LABS 11/13:  Chems- wnl;  CBC- wnl;  UA- ok...   ~  Aug 02, 2012:  65mo ROV & Hannah Mercado is doing satis at 44, notes sl decr energy still but hit some golf balls recently & plans to play 9 holes soon on a cool day... We reviewed the following medical problems during today's office visit >>     Hx AB, pulm fibrosis> on ProventilHFA prn; no resp symptoms & breathing good, & she denies cough, phlegm, SOB, etc; f/u CXR w/o acute changes...    MVP, HBP> on ASA81 & Amlod2.5; BP= 122/64 & 130s/70s at home; stable w/ msc & gr1/6 SEM; no CP, palpit, ch in SOB, edema,  etc...    Chol> remains on Lip20 & FLP 5/14 looks great w/ TChol 174, TG 64, HDL 78, LDL 83    HH, GERD> on Prilosec20mg /d + antireflux regimen in view of her HH etc; she denies abd pain, n/v, notes intermit diarrhea, no blood seen...    Hx rectal cancer> s/p lap low anterior resection 11/12; f/u colonoscopy 11/13 by DrGessner- mod divertics, anastomosis ok; she sees DrByerly Q31mo- note 1/14 reviewed still w/ loose stool but not diarrhea (uses Immodium prn); on [place]; she's avoiding lactose & gas is improved, improved after nutrition consult.     DJD, Osteopenia> on Fosamax70/wk along w/ Ca, MVI, VitD; she is getting along well but hasn't played golf yet...    Other problems as noted... She likes to take a number of supplements... We reviewed prob list, meds, xrays and labs> see below for updates >>   LABS 5/14:  FLP- at goals on Lip20;  Chems- wnl;  CBC- wnl;  TSH=2.61;  VitD=56;  UA- few wbc, no bact...  ~  February 07, 2013:  32mo ROV & Hannah Mercado is doing well- great attitude & no new complaints or concerns;  She had f/u visit w/ DrByerly 8/14> 45yrs s/p  rectal cancer surg, last colon 11/13 by DrGessner was neg, still gets occas bouts of diarrhea & some urgency; she takes an occas Immodium, no bleeding, no wt loss, etc... We reviewed the following medical problems during today's office visit >>     Hx AB, pulm fibrosis> on ProventilHFA prn; no resp symptoms & breathing good, & she denies cough, phlegm, SOB, etc; f/u CXR w/o acute changes...    MVP, HBP> on ASA81 & Amlod2.5; BP= 116/62 & 130s/70s at home; stable w/ msc & gr1/6 SEM; no CP, palpit, ch in SOB, edema, etc...    Chol> remains on Lip20 & FLP 5/14 looks great w/ TChol 174, TG 64, HDL 78, LDL 83    HH, GERD> on Prilosec20mg /d + antireflux regimen in view of her HH etc; she denies abd pain, n/v, notes intermit diarrhea, no blood seen...    Hx rectal cancer> s/p lap low anterior resection 11/12; f/u colonoscopy 11/13 by DrGessner- mod  divertics, anastomosis ok; she sees DrByerly Q34mo- note 8/14 reviewed still w/ occas loose stool (uses Immodium prn); on [place]; she's avoiding lactose & gas is improved, improved after nutrition consult;  CEA level 6/14 = 3.6    DJD, Osteopenia> on Fosamax70/wk along w/ Ca, MVI, VitD; she is getting along well but hasn't played golf yet...    Other problems as noted... She likes to take a number of supplements... We reviewed prob list, meds, xrays and labs> see below for updates >>   ~  Aug 15, 2013:  2mo ROV & Hannah Mercado continues to do well overall- her CC is intermittent diarrhea & she takes prn Immodium OTC... We reviewed the following medical problems during today's office visit >>     Hx AB, pulm fibrosis> on ProventilHFA prn; no resp symptoms & breathing good, & she denies cough, phlegm, SOB, etc; f/u CXR w/o acute changes...    MVP, HBP> on ASA81 & Amlod2.5; BP= 124/70 & similar at home; stable w/ msc & gr1/6 SEM; no CP, palpit, ch in SOB, edema, etc...    Chol> remains on Lip20 & FLP 5/15 looks great w/ TChol 184, TG 84, HDL 83, LDL 84    HH, GERD> on Prilosec20mg /d + antireflux regimen in view of her HH etc; she denies abd pain, n/v, notes intermit diarrhea, no blood seen...    Hx rectal cancer> s/p lap low anterior resection 11/12 by DrByerly; f/u colonoscopy 11/13 by DrGessner- mod divertics, anastomosis ok; she sees DrByerly Q8mo- note 8/14 reviewed still w/ occas loose stool (uses Immodium prn); on Align & Activia; she's avoiding lactose & gas is improved, improved after nutrition consult;  CEA level 2/15 = 3.2 (stable).    DJD, Osteopenia> on Fosamax70/wk along w/ Ca, MVI, VitD; she is getting along well but hasn't played much golf since her surg...    Other problems as noted... She likes to take a number of supplements... We reviewed prob list, meds, xrays and labs> see below for updates >>   CXR => done 6/15>  Borderline cardiomeg, prominent PAs, calcif Ao arch, COPD w/ sl incr  markings/ NAD, DJD in Tspine...  LABS 5/15:  FLP- within parameters on Lip20;  Chems- wnl;  CBC- ok w/ Hg=12.6;  TSH=2.41;  VitD=55 on 1000u daily...  BMD => done 6/15>  Lowest Tscore is -2.6 in Lumbar spine; she is rec to continue ALENDRONATE 70mg /wk & calcium/ MVI/ VitD supplements...   ~  February 25, 2014:  41mo ROV & Hannah Mercado reports a  good interval & no new complaints or concerns;  She has lost 6# to 124# & she is underweight w/ BMI=20- asked to add nutritional supplements like ensure etc 1-2 cans daily...     She has a ProairHFA inhaler but hasn't needed it in yrs she says; breathing stable w/ her chr lung dis- Hx AB & pulm fibrosis, she denies much cough/ sput/ CP/ SOB...     BP & MVP remain stable on low dose Amlodipine 2.5mg /d; BP= 122/62 7 she remains relatively asymptomatic; we reviewed exercise program...     Chol is treated w/ Lip20 & last FLP 5/15 looked good; she has lost wt down to 124# w/ BMI=20 7 encouraged to eat more & consider supplement...    Hx adenoca of rectum, s/p surg 11/12- followed by DrByerly & DrGessner, stable & no known recurrence...    Hx DJD, osteopenia on fosamax, VitD, 7 OTC analgesics as needed...    Nocturnal leg cramps> she thinks neuropathy but tonic water & prev Quinine rx helped; offwered Neuro eval but she wants to wait...  We reviewed prob list, meds, xrays and labs> see below for updates >> she had the 2015 flu vaccine 11/15 & we gave her the PREVNAR-13 vaccination today...            Problem List:   pt's cell # [id number]  ALLERGY (ICD-995.3) - uses OTC meds as needed.  ASTHMATIC BRONCHITIS, ACUTE (ICD-466.0) - uses PROAIR as needed, but doing well, breathing OK. ~  7/09:  AB exac Rx w/ Pred, Avelox, Mucinex, etc... symptoms resolved. ~  CXR: baseline film w/ mild cardiomeg, scarring, chr changes, HH seen, & NAD.Marland Kitchen. ~  f/u CXR 6/11 & 7/12>  no change... 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 exam showed bibasilar velcro rales about 1/3rd way up> no change.  HYPERTENSION >> on NORVASC 5mg - 1/2 tab daily... ~  1/13: BP= 160/80 & we decided to start Norvasc5mg /d... subeq cut down to 1/2 tab per phone message. ~  3/13: BP= 154/84 & repeat 124/74; she feels well & denies CP, palpit, SOB, edema, etc; continue same... ~  5/13:  BP= 150/68 & she remains asymptomatic &in good spirits; ok to continue same for now, monitor BP at home. ~  11/13:  BP= 122/70 & she continues to gain strength slowly... ~  11/14: on Amlod5-1/2 daily; BP= 116.62 & she denies CP, palpit, SOB, edema... ~  5/15: on ASA81 & Amlod2.5; BP= 124/70 & similar at home; stable w/ msc & gr1/6 SEM; no CP, palpit, ch in SOB, edema, etc ~  12/15: on ASA81 & Amlod2.5; BP= 122/62 and stable, doing satis...  MITRAL VALVE PROLAPSE (ICD-424.0) - on ASA 81mg /d... had 2DEcho 2/04 showing MVP, mild MR, normal LVF... StressEcho w/ EF=60%... subseq Cardiolite 12/03 was normal w/o ischemia and EF=65%... denies CP, palpit, dizzy, syncope, edema, etc; she is gradually increasing activities post op... ~  EKG 11/12 showed NSR, rate60, sl incr voltage, NAD...  HYPERCHOLESTEROLEMIA (ICD-272.0) - controlled on LIPITOR 20mg /d,  and tol well...  ~  labs 7/08 showed TChol 183, TG 111, HDL 65, LDL 96... Doing well, continue Lip20. ~  FLP 11/09 showed TChol 189, TG 70, HDL 71, LDL 104 ~  FLP 5/10 showed TChol 173, TG 53, HDL 75, LDL 87... Remains  stable on Lip20. ~  FLP 6/11 showed TChol 183, TG 103, HDL 74, LDL 88 ~  FLP 7/12 showed TChol 182, TG 127, HDL 74, LDL 82... Continues stable on Lip20. ~  FLP 5/13 on Lip20 showed TChol 167, TG 79, HDL 82, LDL 69... Continue same. ~  FLP 5/14 on Lip20 showed TChol 174, TG 64, HDL 78, LDL 83 ~  FLP 5/15 on Lip20 showed TChol 184, TG 84, HDL 83, LDL 84  HIATAL HERNIA (ICD-553.3)  & GERD (ICD-530.81) - she has a HH seen on CXR and CT Chest... takes OMEPRAZOLE 20mg /d and denies heartburn, GERD, regurg, dysphagia, etc... pt never had EGD or Colonoscopy> she had repeatedly refused to sched a colonoscopy! ~  Q45mo ROVs>  she continues to deny reflux symptoms, abd pain, N/V, etc>  ADENOCARCINOMA of the RECTUM >> Dx 9/12 via colonoscopy for hematochezia & 11/12 underwent laparoscopic assisted low anterior resection by DrByerly [facility] & left ovarian cystectomy (benign) by Samuel Jester; final path = T2 N0 mod diff adenocarcinoma invading into but not through the muscularis, no lymphatic invasion, 12 neg LNs, clear margins... ~  6/13:  She continues to f/u w/ DrByerly; c/o some loose stools, otherw ok... ~  11/13: she had f/u appt w/ DrGessner- Colonoscopy 11/13 showed severe divertics on left, mod on right, clean anastomosis... ~  She continues to f/u w/ DrByerly, [facility] (last visit 2/15)> doing satis, just occas bouts of diarrhea & uses Immodium as needed...  RECURRENT UTIs>  Routine urinalysis w/ WBC, Bact, Leukocyte+, etc... Treated w/ Cipro as required.  DEGENERATIVE JOINT DISEASE (ICD-715.90) - mild in fingers, no other complaints x "I can't make quick turns anymore"... she notes the Tonic Water Qhs works great for her nightime cramps, & on NEURONTIN 100mg - 2Qhs for ?neuropathy symptoms?  OSTEOPENIA (ICD-733.90) - BMDs here in 2003, 2005, 2007- showed osteopenia/ osteoporosis w/ TScores -1.9 to -2.5.Marland KitchenMarland Kitchen on FOSAMAX, Ca++, Vits... ~  labs 11/09 showed Vit D level = 28... rec to take OTC supplement 1000 u daily. ~  BMD here 7/11 showed TScores -2.3 in Lumbar Spine, and -2.3 in right FemNeck... continue Rx. ~  Labs 5/13 showed Vit D level = 54... rec to continue her 1000u supplement...  ANXIETY (ICD-300.00)  Health Maintenance - GYN= DrHenley & follow up [place] w/ neg exam she says and stool cards were neg by her report... [facility] M[facility] 6/10 [facility] was neg... she refuses to consider a  colonoscopy... Flu vaccine given each fall... had PNEUMOVAX 2009 & PREVNAR-13 given [id number]... OK TDAP 6/11...   Past Surgical History  Procedure Laterality Date  . Nasal sinus surgery  1994  . Hammer toe surgery  1995    Dr. Silverio Decamp  . Appendectomy    . Colonoscopy  11/27/10    rectal cancer, small sigmoid adenoma, diverticulosis  . Vaginal hysterectomy  1980's  . Ectopic pregnancy surgery      Midline incision  . Laparoscopic left oophorectomy, laparoscopic low anterior resection, placement of onq pain pump  02/08/2011  . Colon resection  02/08/2011    Procedure: LAPAROSCOPIC SIGMOID COLON RESECTION;  Surgeon: Stark Klein, MD;  Location: [facility];  Service: General;  Laterality: N/A;  Laparoscopic Lower Anterior Bowel Resection  . Ovarian cyst removal  02/08/2011    Procedure: OVARIAN CYSTECTOMY;  Surgeon: Stark Klein, MD;  Location: [facility];  Service: General;  Laterality: Left;  Removal of Left Ovary and Pelvic Mass  . Colon surgery      Outpatient Encounter Prescriptions  as of 02/25/2014  Medication Sig  . albuterol (PROVENTIL HFA;VENTOLIN HFA) 108 (90 BASE) MCG/ACT inhaler Inhale 2 puffs into the lungs every 6 (six) hours as needed. Wheezing   . alendronate (FOSAMAX) 70 MG tablet TAKE 1 TABLET BY MOUTH EVERY 7 DAYS. TAKE WITH A FULL GLASS OF WATER ON AN EMPTY STOMACH  . amLODipine (NORVASC) 5 MG tablet Take 2.5 mg by mouth daily.  Marland Kitchen ascorbic acid (VITAMIN C) 250 MG CHEW Chew 250 mg by mouth daily.   Marland Kitchen aspirin 81 MG tablet Take 81 mg by mouth every morning.   Marland Kitchen atorvastatin (LIPITOR) 20 MG tablet TAKE 1 TABLET (20 MG TOTAL) BY MOUTH AT BEDTIME.  . Calcium Carbonate-Vitamin D (CALCIUM-VITAMIN D) 500-200 MG-UNIT per tablet Take 2 tablets by mouth daily.   . Cholecalciferol (VITAMIN D3) 1000 UNITS CAPS Take 1 capsule by mouth daily.   . ferrous sulfate 325 (65 FE) MG tablet Take 325 mg by mouth daily with breakfast.  . loperamide (LOPERAMIDE A-D) 2 MG tablet Take 1/2 tablet by  mouth every other day  . omeprazole (PRILOSEC) 20 MG capsule Take 20 mg by mouth daily.   . vitamin B-12 (CYANOCOBALAMIN) 250 MCG tablet Take 250 mcg by mouth daily.   . vitamin E (VITAMIN E) 200 UNIT capsule Take 200 Units by mouth daily.     Allergies  Allergen Reactions  . Sulfonamide Derivatives Nausea And Vomiting    Happened a long time ago, does not remember the other reaction she had.    Current Medications, Allergies, Past Medical History, Past Surgical History, Family History, and Social History were reviewed in [employer] record.   Review of Systems         See HPI - all other systems neg except as noted...  The patient denies anorexia, fever, weight loss, weight gain, vision loss, decreased hearing, hoarseness, chest pain, syncope, dyspnea on exertion, peripheral edema, prolonged cough, headaches, hemoptysis, abdominal pain, melena, hematochezia, severe indigestion/heartburn, hematuria, incontinence, muscle weakness, suspicious skin lesions, transient blindness, difficulty walking, depression, unusual weight change, abnormal bleeding, enlarged lymph nodes, and angioedema.   Objective:   Physical Exam     WD, WN, 80 y/o WF in NAD... GENERAL:  Alert & oriented; pleasant & cooperative... HEENT:  [place]/AT, Glasses, EOM-wnl, PERRLA, EACs-clear, TMs-wnl, NOSE-clear, THROAT-clear & wnl. NECK:  Supple w/ fair ROM; no JVD; normal carotid impulses w/o bruits; no thyromegaly or nodules palpated; no lymphadenopathy. CHEST:  few bibasilar dry rales about 1/3rd way up, no wheezing, no rhonchi, no cough, etc... HEART:  Regular Rhythm; without murmurs/ rubs/ or gallops, no msc heard... ABDOMEN:  Surg scar healed, nontender to palp, normal bowel sounds; no organomegaly or masses detected. EXT:  no deformities, mild arthritic changes; no varicose veins/ +venous insuffic/ no edema. NEURO:  CN's intact; no focal neuro deficits... DERM:  No lesions noted; no rash  etc...  RADIOLOGY DATA:  Reviewed in the EPIC EMR & discussed w/ the patient...  LABORATORY DATA:  Reviewed in the EPIC EMR & discussed w/ the patient...   Assessment & Plan:    PULM>  AB, Pulm Fibrosis>  Overall stable on prn Proair; CXR reviewed & resp exam w/o changes, stable.  HBP>  BP improved w/ low dose Amlodipine 2.5mg /d, continue same & monitor at home...  MVP>  She is essent asymptomatic w/o CP, palpit, SOB, edema, etc...  CHOL>  Stable on Lip20 + diet, continue same...  GI> HH, GERD>  Stable on Prilosec, continue same.Marland KitchenMarland Kitchen  RECTAL CANCER>  As above- she is post op 11/12 & had uneventful rehab; slowly improving and diarrhea is diminished w/ Align/ Activia; she will maintain f/u w/ DrByerly & DrGessner ==> f/u colonoscopy 11/13 w/ divertics, no recurrence.  DJD/ Osteopenia>  She remains on Fosamax, Calcium, MVI, Vit D, etc; she will be due for BMD next yr...  UTI>  Prev treated w/ Cipro empirically & currently asymptomatic...  Anxiety>  Stable & she does not require anxiolytic Rx...   Patient's Medications  New Prescriptions   No medications on file  Previous Medications   ALBUTEROL (PROVENTIL HFA;VENTOLIN HFA) 108 (90 BASE) MCG/ACT INHALER    Inhale 2 puffs into the lungs every 6 (six) hours as needed. Wheezing    ALENDRONATE (FOSAMAX) 70 MG TABLET    TAKE 1 TABLET BY MOUTH EVERY 7 DAYS. TAKE WITH A FULL GLASS OF WATER ON AN EMPTY STOMACH   AMLODIPINE (NORVASC) 5 MG TABLET    Take 2.5 mg by mouth daily.   ASCORBIC ACID (VITAMIN C) 250 MG CHEW    Chew 250 mg by mouth daily.    ASPIRIN 81 MG TABLET    Take 81 mg by mouth every morning.    CALCIUM CARBONATE-VITAMIN D (CALCIUM-VITAMIN D) 500-200 MG-UNIT PER TABLET    Take 2 tablets by mouth daily.    CHOLECALCIFEROL (VITAMIN D3) 1000 UNITS CAPS    Take 1 capsule by mouth daily.    FERROUS SULFATE 325 (65 FE) MG TABLET    Take 325 mg by mouth daily with breakfast.   LOPERAMIDE (LOPERAMIDE A-D) 2 MG TABLET    Take 1/2  tablet by mouth every other day   OMEPRAZOLE (PRILOSEC) 20 MG CAPSULE    Take 20 mg by mouth daily.    VITAMIN B-12 (CYANOCOBALAMIN) 250 MCG TABLET    Take 250 mcg by mouth daily.    VITAMIN E (VITAMIN E) 200 UNIT CAPSULE    Take 200 Units by mouth daily.   Modified Medications   Modified Medication Previous Medication   ATORVASTATIN (LIPITOR) 20 MG TABLET atorvastatin (LIPITOR) 20 MG tablet      TAKE 1 TABLET (20 MG TOTAL) BY MOUTH AT BEDTIME.    TAKE 1 TABLET (20 MG TOTAL) BY MOUTH AT BEDTIME.  Discontinued Medications   No medications on file

## 2014-05-13 DIAGNOSIS — Z85048 Personal history of other malignant neoplasm of rectum, rectosigmoid junction, and anus: Secondary | ICD-10-CM | POA: Diagnosis not present

## 2014-08-28 ENCOUNTER — Other Ambulatory Visit (INDEPENDENT_AMBULATORY_CARE_PROVIDER_SITE_OTHER): Payer: Medicare Other

## 2014-08-28 ENCOUNTER — Encounter: Payer: Self-pay | Admitting: Pulmonary Disease

## 2014-08-28 ENCOUNTER — Ambulatory Visit (INDEPENDENT_AMBULATORY_CARE_PROVIDER_SITE_OTHER): Payer: Medicare Other | Admitting: Pulmonary Disease

## 2014-08-28 VITALS — BP 136/80 | HR 70 | Temp 97.0°F | Wt 126.6 lb

## 2014-08-28 DIAGNOSIS — M159 Polyosteoarthritis, unspecified: Secondary | ICD-10-CM

## 2014-08-28 DIAGNOSIS — I059 Rheumatic mitral valve disease, unspecified: Secondary | ICD-10-CM | POA: Diagnosis not present

## 2014-08-28 DIAGNOSIS — I10 Essential (primary) hypertension: Secondary | ICD-10-CM

## 2014-08-28 DIAGNOSIS — K5730 Diverticulosis of large intestine without perforation or abscess without bleeding: Secondary | ICD-10-CM

## 2014-08-28 DIAGNOSIS — F411 Generalized anxiety disorder: Secondary | ICD-10-CM

## 2014-08-28 DIAGNOSIS — R0989 Other specified symptoms and signs involving the circulatory and respiratory systems: Secondary | ICD-10-CM

## 2014-08-28 DIAGNOSIS — M150 Primary generalized (osteo)arthritis: Secondary | ICD-10-CM

## 2014-08-28 DIAGNOSIS — J8410 Pulmonary fibrosis, unspecified: Secondary | ICD-10-CM | POA: Diagnosis not present

## 2014-08-28 DIAGNOSIS — C20 Malignant neoplasm of rectum: Secondary | ICD-10-CM

## 2014-08-28 HISTORY — DX: Other specified symptoms and signs involving the circulatory and respiratory systems: R09.89

## 2014-08-28 LAB — CBC WITH DIFFERENTIAL/PLATELET
BASOS ABS: 0 10*3/uL (ref 0.0–0.1)
BASOS PCT: 0.3 % (ref 0.0–3.0)
EOS ABS: 0.1 10*3/uL (ref 0.0–0.7)
Eosinophils Relative: 1.8 % (ref 0.0–5.0)
HCT: 38.9 % (ref 36.0–46.0)
Hemoglobin: 13 g/dL (ref 12.0–15.0)
LYMPHS ABS: 1.5 10*3/uL (ref 0.7–4.0)
LYMPHS PCT: 21.8 % (ref 12.0–46.0)
MCHC: 33.5 g/dL (ref 30.0–36.0)
MCV: 97.8 fl (ref 78.0–100.0)
MONO ABS: 0.4 10*3/uL (ref 0.1–1.0)
MONOS PCT: 6.2 % (ref 3.0–12.0)
NEUTROS ABS: 4.8 10*3/uL (ref 1.4–7.7)
Neutrophils Relative %: 69.9 % (ref 43.0–77.0)
Platelets: 223 10*3/uL (ref 150.0–400.0)
RBC: 3.98 Mil/uL (ref 3.87–5.11)
RDW: 14.1 % (ref 11.5–15.5)
WBC: 6.8 10*3/uL (ref 4.0–10.5)

## 2014-08-28 LAB — URINALYSIS, ROUTINE W REFLEX MICROSCOPIC
Bilirubin Urine: NEGATIVE
Hgb urine dipstick: NEGATIVE
Ketones, ur: NEGATIVE
LEUKOCYTES UA: NEGATIVE
Nitrite: NEGATIVE
Specific Gravity, Urine: 1.015 (ref 1.000–1.030)
Total Protein, Urine: 30 — AB
URINE GLUCOSE: NEGATIVE
Urobilinogen, UA: 0.2 (ref 0.0–1.0)
WBC UA: NONE SEEN (ref 0–?)
pH: 6 (ref 5.0–8.0)

## 2014-08-28 LAB — LIPID PANEL
CHOLESTEROL: 187 mg/dL (ref 0–200)
HDL: 92.9 mg/dL (ref >39.00)
LDL Cholesterol: 78 mg/dL (ref 0–99)
NonHDL: 94.1
Total CHOL/HDL Ratio: 2
Triglycerides: 83 mg/dL (ref 0.0–149.0)
VLDL: 16.6 mg/dL (ref 0.0–40.0)

## 2014-08-28 LAB — BASIC METABOLIC PANEL
BUN: 20 mg/dL (ref 6–23)
CALCIUM: 9.6 mg/dL (ref 8.4–10.5)
CO2: 30 meq/L (ref 19–32)
CREATININE: 1.01 mg/dL (ref 0.40–1.20)
Chloride: 105 mEq/L (ref 96–112)
GFR: 54.75 mL/min — ABNORMAL LOW (ref >60.00)
Glucose, Bld: 96 mg/dL (ref 70–99)
POTASSIUM: 5.5 meq/L — AB (ref 3.5–5.1)
SODIUM: 139 meq/L (ref 135–145)

## 2014-08-28 LAB — HEPATIC FUNCTION PANEL
ALT: 7 U/L (ref 0–35)
AST: 19 U/L (ref 0–37)
Albumin: 4.6 g/dL (ref 3.5–5.2)
Alkaline Phosphatase: 64 U/L (ref 39–117)
Bilirubin, Direct: 0.1 mg/dL (ref 0.0–0.3)
TOTAL PROTEIN: 8.2 g/dL (ref 6.0–8.3)
Total Bilirubin: 0.6 mg/dL (ref 0.2–1.2)

## 2014-08-28 LAB — TSH: TSH: 3.86 u[IU]/mL (ref 0.35–4.50)

## 2014-08-28 NOTE — Patient Instructions (Signed)
Today we updated your med list in our EPIC system...    Continue your current medications the same...  Today we did your follow up CXR & FASTING blood work...    We will contact you w/ the results when available...   We will sched a Carotid artery doppler ultrasound test to check for any blockage in your right carotid...  Keep up the good work w/ your exercise & add-in a nutritional supplement like ENSURE or BOOST daily...  Call for any questions...  Let's plan a follow up visit in 69mo, sooner if needed for problems.Marland KitchenMarland Kitchen

## 2014-08-28 NOTE — Progress Notes (Signed)
Subjective:    Patient ID: Hannah Mercado, female    DOB: April 20, 1922, 81 y.o.   MRN: [id number]  HPI 81 y/o WF here for a follow up visit... ~  SEE PREV EPIC NOTES FOR OLDER DATA >>    CXR 11/13 showed borderline cardiomeg, ectasia of the Ao, chronic lung changes/ NAD, DJD/ spondylosis...  LABS 11/13:  Chems- wnl;  CBC- wnl;  UA- ok...   LABS 5/14:  FLP- at goals on Lip20;  Chems- wnl;  CBC- wnl;  TSH=2.61;  VitD=56;  UA- few wbc, no bact...  ~  February 07, 2013:  50mo ROV & Hannah Mercado is doing well- great attitude & no new complaints or concerns;  She had f/u visit w/ DrByerly 8/14> 35yrs s/p rectal cancer surg, last colon 11/13 by DrGessner was neg, still gets occas bouts of diarrhea & some urgency; she takes an occas Immodium, no bleeding, no wt loss, etc... We reviewed the following medical problems during today's office visit >>     Hx AB, pulm fibrosis> on ProventilHFA prn; no resp symptoms & breathing good, & she denies cough, phlegm, SOB, etc; f/u CXR w/o acute changes...    MVP, HBP> on ASA81 & Amlod2.5; BP= 116/62 & 130s/70s at home; stable w/ msc & gr1/6 SEM; no CP, palpit, ch in SOB, edema, etc...    Chol> remains on Lip20 & FLP 5/14 looks great w/ TChol 174, TG 64, HDL 78, LDL 83    HH, GERD> on Prilosec20mg /d + antireflux regimen in view of her HH etc; she denies abd pain, n/v, notes intermit diarrhea, no blood seen...    Hx rectal cancer> s/p lap low anterior resection 11/12; f/u colonoscopy 11/13 by DrGessner- mod divertics, anastomosis ok; she sees DrByerly Q68mo- note 8/14 reviewed still w/ occas loose stool (uses Immodium prn); on [place]; she's avoiding lactose & gas is improved, improved after nutrition consult;  CEA level 6/14 = 3.6    DJD, Osteopenia> on Fosamax70/wk along w/ Ca, MVI, VitD; she is getting along well but hasn't played golf yet...    Other problems as noted... She likes to take a number of supplements... We reviewed prob list, meds, xrays and labs> see  below for updates >>   ~  Aug 15, 2013:  39mo ROV & Hannah Mercado continues to do well overall- her CC is intermittent diarrhea & she takes prn Immodium OTC... We reviewed the following medical problems during today's office visit >>     Hx AB, pulm fibrosis> on ProventilHFA prn; no resp symptoms & breathing good, & she denies cough, phlegm, SOB, etc; f/u CXR w/o acute changes...    MVP, HBP> on ASA81 & Amlod2.5; BP= 124/70 & similar at home; stable w/ msc & gr1/6 SEM; no CP, palpit, ch in SOB, edema, etc...    Chol> remains on Lip20 & FLP 5/15 looks great w/ TChol 184, TG 84, HDL 83, LDL 84    HH, GERD> on Prilosec20mg /d + antireflux regimen in view of her HH etc; she denies abd pain, n/v, notes intermit diarrhea, no blood seen...    Hx rectal cancer> s/p lap low anterior resection 11/12 by DrByerly; f/u colonoscopy 11/13 by DrGessner- mod divertics, anastomosis ok; she sees DrByerly Q68mo- note 8/14 reviewed still w/ occas loose stool (uses Immodium prn); on Align & Activia; she's avoiding lactose & gas is improved, improved after nutrition consult;  CEA level 2/15 = 3.2 (stable).    DJD, Osteopenia> on Fosamax70/wk along w/ Ca,  MVI, VitD; she is getting along well but hasn't played much golf since her surg...    Other problems as noted... She likes to take a number of supplements... We reviewed prob list, meds, xrays and labs> see below for updates >>   CXR => done 6/15>  Borderline cardiomeg, prominent PAs, calcif Ao arch, COPD w/ sl incr markings/ NAD, DJD in Tspine...  LABS 5/15:  FLP- within parameters on Lip20;  Chems- wnl;  CBC- ok w/ Hg=12.6;  TSH=2.41;  VitD=55 on 1000u daily...  BMD => done 6/15>  Lowest Tscore is -2.6 in Lumbar spine; she is rec to continue ALENDRONATE 70mg /wk & calcium/ MVI/ VitD supplements...  ~  February 25, 2014:  65mo ROV & Hannah Mercado reports a good interval & no new complaints or concerns;  She has lost 6# to 124# & she is underweight w/ BMI=20- asked to add nutritional  supplements like ensure etc 1-2 cans daily...     She has a ProairHFA inhaler but hasn't needed it in yrs she says; breathing stable w/ her chr lung dis- Hx AB & pulm fibrosis, she denies much cough/ sput/ CP/ SOB...     BP & MVP remain stable on low dose Amlodipine 2.5mg /d; BP= 122/62 7 she remains relatively asymptomatic; we reviewed exercise program...     Chol is treated w/ Lip20 & last FLP 5/15 looked good; she has lost wt down to 124# w/ BMI=20 7 encouraged to eat more & consider supplement...    Hx adenoca of rectum, s/p surg 11/12- followed by DrByerly & DrGessner, stable & no known recurrence...    Hx DJD, osteopenia on fosamax, VitD, 7 OTC analgesics as needed...    Nocturnal leg cramps> she thinks neuropathy but tonic water & prev Quinine rx helped; offwered Neuro eval but she wants to wait...  We reviewed prob list, meds, xrays and labs> see below for updates >> she had the 2015 flu vaccine 11/15 & we gave her the PREVNAR-13 vaccination today...   ~  August 28, 2014:  53mo ROV & Hannah Mercado will soon be 85!  Planning a trip to [place] to visit daugh in Aug;  She does crosswords everyday!  She reports doing satis, no new complaints or concerns- taking nutritional supplements & wt is up a few lbs...    Hx AB, pulm fibrosis> on ProventilHFA prn (rarely uses); no resp symptoms & breathing good, & she denies cough, phlegm, SOB, etc; CXR w/o acute changes...    MVP, HBP> on ASA81 & Amlod2.5; BP= 136/80 & similar at home; stable w/ msc & gr1/6 SEM; no CP, palpit, ch in SOB, edema, etc...    Right carotid bruit> no cerebral ischemic symptoms; CDoppler 6/16 showed 40-59% RICA stensis, Rx ASA...    Chol> remains on Lip20 & FLP 6/16 looks great w/ TChol 187, TG 83, HDL 93, LDL 78    HH, GERD> on Prilosec20mg /d + antireflux regimen in view of her HH etc; she denies abd pain, n/v, notes intermit diarrhea, no blood seen...    Hx rectal cancer> s/p lap low anterior resection 11/12 by DrByerly; f/u colonoscopy 11/13  by DrGessner- mod divertics, anastomosis ok; she sees DrByerly Q6-30mo- notes reviewed still w/ occas loose stool (uses Immodium prn); on Align & Activia; she's avoiding lactose & gas is improved, improved after nutrition consult;  CEA level 2/15 = 3.2 (stable); CEA 2/16 by DrByerly & we don't have report.    DJD, Osteopenia> on Fosamax70/wk along w/ Ca, MVI, VitD; last  BMD 6/15 w/ lowest Tscore-2.6 (continue same rx), she is getting along well but hasn't played much golf since her surg...    Other problems as noted... She likes to take a number of supplements... We reviewed prob list, meds, xrays and labs> see below for updates >>   CXR 6/16 showed borderline heart size, hyperinflated lungs, icr markings bilat, NAD/ no change...  LABS 6/16:  FLP- at goals on Atorva20;  Chems- wnl;  CBC- wnl;  TSH=3.86;  UA- clear... NOTE> CEA done 2/16 by DrByerly & we do not have results.  CDopplers 6/16 showed irreg mixed plaque bilat, 40-59% RICAstenosis & [id number] LICAstenosis, norm subclav & antegrade vertebrals..The current medical regimen is effective;  continue present plan and medications. ASA daily.            Problem List:   pt's cell # [id number]  ALLERGY (ICD-995.3) - uses OTC meds as needed.  ASTHMATIC BRONCHITIS, ACUTE (ICD-466.0) - uses PROAIR as needed, but doing well, breathing OK. ~  7/09:  AB exac Rx w/ Pred, Avelox, Mucinex, etc... symptoms resolved. ~  CXR: baseline film w/ mild cardiomeg, scarring, chr changes, HH seen, & NAD.Marland Kitchen. ~  f/u CXR 6/11 & 7/12>  no change... ~  CXR 11/13 showed borderline cardiomeg, ectasia of the Ao, chronic lung changes/ NAD, DJD/ spondylosis..  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 exam showed bibasilar velcro rales about 1/3rd way up> no change.  HYPERTENSION >> on NORVASC 5mg - 1/2 tab daily... ~  1/13: BP= 160/80 & we decided to start Norvasc5mg /d... subeq cut down to 1/2 tab per phone message. ~  3/13: BP= 154/84 & repeat 124/74; she feels well & denies CP, palpit, SOB, edema, etc; continue same... ~  5/13:  BP= 150/68 & she remains asymptomatic &in good spirits; ok to continue same for now, monitor BP at home. ~  11/13:  BP= 122/70 & she continues to gain strength slowly... ~  11/14: on Amlod5-1/2 daily; BP= 116.62 & she denies CP, palpit, SOB, edema... ~  5/15: on ASA81 & Amlod2.5; BP= 124/70 & similar at home; stable w/ msc & gr1/6 SEM; no CP, palpit, ch in SOB, edema, etc ~  12/15: on ASA81 & Amlod2.5; BP= 122/62 and stable, doing satis... ~  6/16: on ASA81 & Amlod2.5; BP= 136/80 & similar at home; stable w/ msc & gr1/6 SEM; no CP, palpit, ch in SOB, edema, etc  MITRAL VALVE PROLAPSE (ICD-424.0) - on ASA 81mg /d... had 2DEcho 2/04 showing MVP, mild MR, normal LVF... StressEcho w/ EF=60%... subseq Cardiolite 12/03 was normal w/o ischemia and EF=65%... denies CP, palpit, dizzy, syncope, edema, etc; she is gradually increasing activities post op... ~  EKG 11/12 showed NSR, rate60, sl incr voltage, NAD...  Right Carotid Bruit>  no cerebral ischemic symptoms; CDoppler 6/16 showed 40-59% RICA stensis, Rx ASA.  HYPERCHOLESTEROLEMIA (ICD-272.0) - controlled on LIPITOR 20mg /d,  and tol well...  ~  labs 7/08 showed TChol 183, TG 111, HDL 65, LDL 96... Doing well, continue Lip20. ~  FLP 11/09 showed TChol 189, TG 70, HDL 71, LDL 104 ~  FLP 5/10 showed TChol 173, TG 53, HDL 75, LDL 87... Remains stable on Lip20. ~  FLP 6/11 showed TChol 183, TG 103, HDL 74, LDL 88 ~  FLP 7/12 showed TChol 182, TG 127, HDL 74, LDL 82... Continues stable on Lip20. ~  FLP 5/13 on Lip20 showed TChol 167, TG 79, HDL 82, LDL 69... Continue same. ~  FLP 5/14 on Lip20 showed TChol 174,  TG 64, HDL 78, LDL 83 ~  FLP 5/15 on Lip20 showed TChol 184, TG 84, HDL 83, LDL 84 ~  FLP 6/16 on Lip20 showed TChol 187, TG 83, HDL 93, LDL 78  HIATAL HERNIA (ICD-553.3) & GERD (ICD-530.81) - she has a HH seen on CXR and CT Chest... takes OMEPRAZOLE 20mg /d and denies heartburn, GERD, regurg, dysphagia, etc... pt never had EGD or Colonoscopy> she had repeatedly refused to sched a colonoscopy! ~  Q63mo ROVs>  she continues to deny reflux symptoms, abd pain, N/V, etc>  ADENOCARCINOMA of the RECTUM >> Dx 9/12 via colonoscopy for hematochezia & 11/12 underwent laparoscopic assisted low anterior resection by DrByerly [facility] & left ovarian cystectomy (benign) by Samuel Jester; final path = T2 N0 mod diff adenocarcinoma invading into but not through the muscularis, no lymphatic invasion, 12 neg LNs, clear margins... ~  6/13:  She continues to f/u w/ DrByerly; c/o some loose stools, otherw ok... ~  11/13: she had f/u appt w/ DrGessner- Colonoscopy 11/13 showed severe divertics on left, mod on right, clean anastomosis... ~  She continues to f/u w/ DrByerly, [facility] (last visit 2/16)> doing satis, just occas bouts of diarrhea & uses Immodium as needed... They did CEA level but we don't have report.  RECURRENT UTIs>  Routine urinalysis w/ WBC, Bact, Leukocyte+, etc... Treated w/ Cipro as required.  DEGENERATIVE JOINT DISEASE (ICD-715.90) - mild in fingers, no other complaints x "I can't make quick turns anymore"... she notes the Tonic Water Qhs works great for her nightime cramps, & on NEURONTIN 100mg - 2Qhs for ?neuropathy symptoms?  OSTEOPENIA (ICD-733.90) - BMDs here in 2003, 2005, 2007- showed osteopenia/ osteoporosis w/ TScores -1.9 to -2.5.Marland KitchenMarland Kitchen on FOSAMAX, Ca++, Vits... ~  labs 11/09 showed Vit D level = 28... rec to take OTC supplement 1000 u daily. ~  BMD here 7/11 showed TScores -2.3 in Lumbar Spine, and -2.3 in right FemNeck... continue Rx. ~  Labs 5/13 showed Vit D level = 54... rec to continue her 1000u  supplement... ~  BMD 6/15>  Lowest Tscore is -2.6 in Lumbar spine; she is rec to continue ALENDRONATE 70mg /wk & calcium/ MVI/ VitD supplements.  ANXIETY (ICD-300.00)  Health Maintenance - GYN= DrHenley & follow up [place] w/ neg exam she says and stool cards were neg by her report... last Mammogram 6/10 at [facility] was neg... she refuses to consider a colonoscopy... Flu vaccine given each fall... had PNEUMOVAX 2009 & PREVNAR-13 given [id number]... OK TDAP 6/11...   Past Surgical History  Procedure Laterality Date  . Nasal sinus surgery  1994  . Hammer toe surgery  1995    Dr. Silverio Decamp  . Appendectomy    . Colonoscopy  11/27/10    rectal cancer, small sigmoid adenoma, diverticulosis  . Vaginal hysterectomy  1980's  . Ectopic pregnancy surgery      Midline incision  . Laparoscopic left oophorectomy, laparoscopic low anterior resection, placement of onq pain pump  02/08/2011  . Colon resection  02/08/2011    Procedure: LAPAROSCOPIC SIGMOID COLON RESECTION;  Surgeon: Stark Klein, MD;  Location: [facility];  Service: General;  Laterality: N/A;  Laparoscopic Lower Anterior Bowel Resection  . Ovarian cyst removal  02/08/2011    Procedure: OVARIAN CYSTECTOMY;  Surgeon: Stark Klein, MD;  Location: [facility];  Service: General;  Laterality: Left;  Removal of Left Ovary and Pelvic Mass  . Colon surgery      Outpatient Encounter Prescriptions as of 08/28/2014  Medication Sig  . albuterol (PROVENTIL HFA;VENTOLIN HFA) 108 (90 BASE) MCG/ACT inhaler Inhale 2 puffs into the lungs every 6 (six) hours as needed. Wheezing   . alendronate (FOSAMAX) 70 MG tablet TAKE 1 TABLET BY MOUTH EVERY 7 DAYS. TAKE WITH A FULL GLASS OF WATER ON AN EMPTY STOMACH  . amLODipine (NORVASC) 5 MG tablet Take 2.5 mg by mouth daily.  Marland Kitchen ascorbic acid (VITAMIN C) 250 MG CHEW Chew 250 mg by mouth daily.   Marland Kitchen aspirin 81 MG tablet Take 81 mg by mouth every morning.   Marland Kitchen atorvastatin (LIPITOR) 20 MG tablet TAKE 1 TABLET (20 MG TOTAL) BY  MOUTH AT BEDTIME.  . Calcium Carbonate-Vitamin D (CALCIUM-VITAMIN D) 500-200 MG-UNIT per tablet Take 2 tablets by mouth daily.   . Cholecalciferol (VITAMIN D3) 1000 UNITS CAPS Take 1 capsule by mouth daily.   . ferrous sulfate 325 (65 FE) MG tablet Take 325 mg by mouth daily with breakfast.  . loperamide (LOPERAMIDE A-D) 2 MG tablet Take 2 mg by mouth every 6 (six) hours as needed.  Marland Kitchen omeprazole (PRILOSEC) 20 MG capsule Take 20 mg by mouth daily.   . vitamin B-12 (CYANOCOBALAMIN) 250 MCG tablet Take 250 mcg by mouth daily.   . vitamin E (VITAMIN E) 200 UNIT capsule Take 200 Units by mouth daily.     Allergies  Allergen Reactions  . Sulfonamide Derivatives Nausea And Vomiting    Happened a long time ago, does not remember the other reaction she had.    Current Medications, Allergies, Past Medical History, Past Surgical History, Family History, and Social History were reviewed in [employer] record.   Review of Systems         See HPI - all other systems neg except as noted...  The patient denies anorexia, fever, weight loss, weight gain, vision loss, decreased hearing, hoarseness, chest pain, syncope, dyspnea on exertion, peripheral edema, prolonged cough, headaches, hemoptysis, abdominal pain, melena, hematochezia, severe indigestion/heartburn, hematuria, incontinence, muscle weakness, suspicious skin lesions, transient blindness, difficulty walking, depression, unusual weight change, abnormal bleeding, enlarged lymph nodes, and angioedema.   Objective:   Physical Exam     WD, WN, 81 y/o WF in NAD... GENERAL:  Alert & oriented; pleasant & cooperative... HEENT:  /AT, Glasses, EOM-wnl, PERRLA, EACs-clear, TMs-wnl, NOSE-clear, THROAT-clear & wnl. NECK:  Supple w/ fair ROM; no JVD; normal carotid impulses w/ faint right Cbruit; no thyromegaly or nodules palpated; no lymphadenopathy. CHEST:  few bibasilar dry rales about 1/3rd way up, no wheezing, no rhonchi, no  cough, etc... HEART:  Regular Rhythm; without murmurs/ rubs/ or gallops, no msc heard... ABDOMEN:  Surg scar healed, nontender to palp, normal bowel sounds; no organomegaly or masses detected. EXT:  no deformities, mild arthritic changes; no varicose veins/ +venous insuffic/ no edema. NEURO:  CN's intact; no focal neuro deficits... DERM:  No lesions noted; no rash etc...  RADIOLOGY DATA:  Reviewed in the EPIC EMR & discussed w/ the patient...  LABORATORY DATA:  Reviewed in the EPIC EMR & discussed w/ the patient...   Assessment & Plan:    PULM>  AB, Pulm Fibrosis>  Overall stable on prn Proair; CXR reviewed & resp exam w/o changes, stable.  HBP>  BP improved w/ low dose Amlodipine 2.5mg /d, continue same & monitor at home...  MVP>  She is essent asymptomatic w/o CP, palpit, SOB, edema, etc...  R Carotid Bruit> CDoppler w/ 40-59% RICAstenosis, on ASA...  CHOL>  Stable on Lip20 + diet, continue same...  GI> HH, GERD>  Stable on Prilosec, continue same...  RECTAL CANCER>  As above- she is post op 11/12 & had uneventful rehab; slowly improving and diarrhea is diminished w/ Align/ Activia; she will maintain f/u w/ DrByerly & DrGessner ==> f/u colonoscopy 11/13 w/ divertics, no recurrence.  DJD/ Osteopenia>  She remains on Fosamax, Calcium, MVI, Vit D, etc; she will be due for BMD next yr...  UTI>  Prev treated w/ Cipro empirically & currently asymptomatic...  Anxiety>  Stable & she does not require anxiolytic Rx...   Patient's Medications  New Prescriptions   No medications on file  Previous Medications   ALBUTEROL (PROVENTIL HFA;VENTOLIN HFA) 108 (90 BASE) MCG/ACT INHALER    Inhale 2 puffs into the lungs every 6 (six) hours as needed. Wheezing    ALENDRONATE (FOSAMAX) 70 MG TABLET    TAKE 1 TABLET BY MOUTH EVERY 7 DAYS. TAKE WITH A FULL GLASS OF WATER ON AN EMPTY STOMACH   AMLODIPINE (NORVASC) 5 MG TABLET    Take 2.5 mg by mouth daily.   ASCORBIC ACID (VITAMIN C) 250 MG CHEW     Chew 250 mg by mouth daily.    ASPIRIN 81 MG TABLET    Take 81 mg by mouth every morning.    ATORVASTATIN (LIPITOR) 20 MG TABLET    TAKE 1 TABLET (20 MG TOTAL) BY MOUTH AT BEDTIME.   CALCIUM CARBONATE-VITAMIN D (CALCIUM-VITAMIN D) 500-200 MG-UNIT PER TABLET    Take 2 tablets by mouth daily.    CHOLECALCIFEROL (VITAMIN D3) 1000 UNITS CAPS    Take 1 capsule by mouth daily.    FERROUS SULFATE 325 (65 FE) MG TABLET    Take 325 mg by mouth daily with breakfast.   LOPERAMIDE (LOPERAMIDE A-D) 2 MG TABLET    Take 2 mg by mouth every 6 (six) hours as needed.   OMEPRAZOLE (PRILOSEC) 20 MG CAPSULE    Take 20 mg by mouth daily.    VITAMIN B-12 (CYANOCOBALAMIN) 250 MCG TABLET    Take 250 mcg by mouth daily.    VITAMIN E (VITAMIN E) 200 UNIT CAPSULE    Take 200 Units by mouth daily.   Modified Medications   No medications on file  Discontinued Medications   No medications on file

## 2014-08-29 ENCOUNTER — Ambulatory Visit (INDEPENDENT_AMBULATORY_CARE_PROVIDER_SITE_OTHER)
Admission: RE | Admit: 2014-08-29 | Discharge: 2014-08-29 | Disposition: A | Discharge status: Home / Self Care | Payer: Medicare Other | Source: Ambulatory Visit | Attending: Pulmonary Disease | Admitting: Pulmonary Disease

## 2014-08-29 ENCOUNTER — Ambulatory Visit (HOSPITAL_COMMUNITY): Payer: Medicare Other | Attending: Internal Medicine

## 2014-08-29 DIAGNOSIS — I6523 Occlusion and stenosis of bilateral carotid arteries: Secondary | ICD-10-CM | POA: Insufficient documentation

## 2014-08-29 DIAGNOSIS — J8410 Pulmonary fibrosis, unspecified: Secondary | ICD-10-CM | POA: Diagnosis not present

## 2014-08-29 DIAGNOSIS — R0989 Other specified symptoms and signs involving the circulatory and respiratory systems: Secondary | ICD-10-CM

## 2014-08-29 DIAGNOSIS — I10 Essential (primary) hypertension: Secondary | ICD-10-CM

## 2014-08-29 DIAGNOSIS — I059 Rheumatic mitral valve disease, unspecified: Secondary | ICD-10-CM

## 2014-08-29 DIAGNOSIS — J449 Chronic obstructive pulmonary disease, unspecified: Secondary | ICD-10-CM | POA: Diagnosis not present

## 2014-08-30 ENCOUNTER — Other Ambulatory Visit: Payer: Self-pay | Admitting: Pulmonary Disease

## 2014-09-01 ENCOUNTER — Other Ambulatory Visit: Payer: Self-pay | Admitting: Pulmonary Disease

## 2014-09-06 ENCOUNTER — Telehealth: Payer: Self-pay | Admitting: Pulmonary Disease

## 2014-09-06 NOTE — Telephone Encounter (Signed)
Pt informed of SN's response. Nothing further needed at this time.

## 2014-09-06 NOTE — Telephone Encounter (Signed)
Per SN: Needs to have this prescribed by Dermatologist.  This medication is an Immunosuppressant and is for severe atopic dermatitis or eczema.

## 2014-09-06 NOTE — Telephone Encounter (Signed)
Allergies  Allergen Reactions  . Sulfonamide Derivatives Nausea And Vomiting    Happened a long time ago, does not remember the other reaction she had.   Pt states when she was here on 08/28/14 that she was told that an ointment called Propopic 1% would be sent to her pharmacy for the rash on her face.  SN please advise if we can send to pharmacy.

## 2014-12-02 DIAGNOSIS — Z1231 Encounter for screening mammogram for malignant neoplasm of breast: Secondary | ICD-10-CM | POA: Diagnosis not present

## 2015-01-09 ENCOUNTER — Encounter: Payer: Self-pay | Admitting: Pulmonary Disease

## 2015-02-27 ENCOUNTER — Ambulatory Visit: Payer: Medicare Other | Admitting: Pulmonary Disease

## 2015-03-11 ENCOUNTER — Other Ambulatory Visit: Payer: Self-pay | Admitting: Pulmonary Disease

## 2015-03-12 ENCOUNTER — Encounter: Payer: Self-pay | Admitting: Pulmonary Disease

## 2015-03-12 ENCOUNTER — Ambulatory Visit (INDEPENDENT_AMBULATORY_CARE_PROVIDER_SITE_OTHER): Payer: Medicare Other | Admitting: Pulmonary Disease

## 2015-03-12 VITALS — BP 128/80 | HR 77 | Temp 98.0°F | Ht 67.0 in | Wt 123.2 lb

## 2015-03-12 DIAGNOSIS — J8410 Pulmonary fibrosis, unspecified: Secondary | ICD-10-CM

## 2015-03-12 DIAGNOSIS — I779 Disorder of arteries and arterioles, unspecified: Secondary | ICD-10-CM | POA: Diagnosis not present

## 2015-03-12 DIAGNOSIS — C20 Malignant neoplasm of rectum: Secondary | ICD-10-CM

## 2015-03-12 DIAGNOSIS — M150 Primary generalized (osteo)arthritis: Secondary | ICD-10-CM

## 2015-03-12 DIAGNOSIS — M159 Polyosteoarthritis, unspecified: Secondary | ICD-10-CM

## 2015-03-12 DIAGNOSIS — I10 Essential (primary) hypertension: Secondary | ICD-10-CM | POA: Diagnosis not present

## 2015-03-12 DIAGNOSIS — M899 Disorder of bone, unspecified: Secondary | ICD-10-CM

## 2015-03-12 DIAGNOSIS — I059 Rheumatic mitral valve disease, unspecified: Secondary | ICD-10-CM

## 2015-03-12 DIAGNOSIS — M949 Disorder of cartilage, unspecified: Secondary | ICD-10-CM

## 2015-03-12 DIAGNOSIS — I739 Peripheral vascular disease, unspecified: Secondary | ICD-10-CM

## 2015-03-12 HISTORY — DX: Disorder of arteries and arterioles, unspecified: I77.9

## 2015-03-12 NOTE — Progress Notes (Signed)
Subjective:    Patient ID: Hannah Mercado, female    DOB: 04/20/1922, 81 y.o.   MRN: [id number]  HPI 81 y/o WF here for a follow up visit... ~  SEE PREV EPIC NOTES FOR OLDER DATA >>    CXR 11/13 showed borderline cardiomeg, ectasia of the Ao, chronic lung changes/ NAD, DJD/ spondylosis...  LABS 11/13:  Chems- wnl;  CBC- wnl;  UA- ok...   LABS 5/14:  FLP- at goals on Lip20;  Chems- wnl;  CBC- wnl;  TSH=2.61;  VitD=56;  UA- few wbc, no bact...  CXR => done 6/15>  Borderline cardiomeg, prominent PAs, calcif Ao arch, COPD w/ sl incr markings/ NAD, DJD in Tspine...  LABS 5/15:  FLP- within parameters on Lip20;  Chems- wnl;  CBC- ok w/ Hg=12.6;  TSH=2.41;  VitD=55 on 1000u daily...  BMD => done 6/15>  Lowest Tscore is -2.6 in Lumbar spine; she is rec to continue ALENDRONATE 70mg /wk & calcium/ MVI/ VitD supplements...  ~  August 28, 2014:  53mo ROV & Katriel will soon be 12!  Planning a trip to [place] to visit daugh in Aug;  She does crosswords everyday!  She reports doing satis, no new complaints or concerns- taking nutritional supplements & wt is up a few lbs...    Hx AB, pulm fibrosis> on ProventilHFA prn (rarely uses); no resp symptoms & breathing good, & she denies cough, phlegm, SOB, etc; CXR w/o acute changes...    MVP, HBP> on ASA81 & Amlod2.5; BP= 136/80 & similar at home; stable w/ msc & gr1/6 SEM; no CP, palpit, ch in SOB, edema, etc...    Right carotid bruit> no cerebral ischemic symptoms; CDoppler 6/16 showed 40-59% RICA stensis, Rx ASA...    Chol> remains on Lip20 & FLP 6/16 looks great w/ TChol 187, TG 83, HDL 93, LDL 78    HH, GERD> on Prilosec20mg /d + antireflux regimen in view of her HH etc; she denies abd pain, n/v, notes intermit diarrhea, no blood seen...    Hx rectal cancer> s/p lap low anterior resection 11/12 by DrByerly; f/u colonoscopy 11/13 by DrGessner- mod divertics, anastomosis ok; she sees DrByerly Q6-70mo- notes reviewed still w/ occas loose stool (uses Immodium prn);  on Align & Activia; she's avoiding lactose & gas is improved, improved after nutrition consult;  CEA level 2/15 = 3.2 (stable); CEA 2/16 by DrByerly & we don't have report.    DJD, Osteopenia> on Fosamax70/wk along w/ Ca, MVI, VitD; last BMD 6/15 w/ lowest Tscore-2.6 (continue same rx), she is getting along well but hasn't played much golf since her surg...    Other problems as noted... She likes to take a number of supplements... We reviewed prob list, meds, xrays and labs> see below for updates >>   CXR 6/16 showed borderline heart size, hyperinflated lungs, icr markings bilat, NAD/ no change...  LABS 6/16:  FLP- at goals on Atorva20;  Chems- wnl;  CBC- wnl;  TSH=3.86;  UA- clear... NOTE> CEA done 2/16 by DrByerly & we do not have results.  CDopplers 6/16 showed irreg mixed plaque bilat, 40-59% RICAstenosis & [id number] LICAstenosis, norm subclav & antegrade vertebrals..The current medical regimen is effective;  continue present plan and medications. ASA daily.  ~  March 12, 2015:  17mo ROV & Alka turned 87 this past summer- surprise party by her 2 children w/ 40 people & she had a blast; she is going to family on [place] for 34mo over the holidays; clinically doing very well-  feels good, still does crosswords daily, no new complaints or concerns... She is reminded to drink 1-2 can Ensure/ Sustacal/ Boost daily to help her gain wt...     She has a ProairHFA inhaler but hasn't needed it in yrs she says; breathing stable w/ her chr lung dis- Hx AB & pulm fibrosis, she denies much cough/ sput/ CP/ SOB...     BP & MVP remain stable on low dose Amlodipine 2.5mg /d; BP= 128/80 & she remains relatively asymptomatic; we reviewed exercise program...     Chol is treated w/ Lip20 & last FLP 6/16 looked good w/ TChol 187, TG 83, HDL 93, LDL 78; she has lost wt down to 123# w/ BMI=20 5 encouraged to eat more + supplement...    Hx adenoca of rectum, s/p surg 11/12- followed by DrByerly & DrGessner, stable & no known  recurrence...    Hx DJD, osteopenia on Fosamax, VitD, MVI, & OTC analgesics as needed...    Nocturnal leg cramps> she thinks neuropathy but tonic water & prev Quinine rx helped...  We reviewed prob list, meds, xrays and labs>  IMP/PLAN>>  Hannah Mercado is doing remarkably well at age 33;  We discussed continuing the same meds, and incr nutrtitional supplements;  We will plan ROV w/ CXR & Labs in 81mo...           Problem List:   pt's cell # [id number]  ALLERGY (ICD-995.3) - uses OTC meds as needed.  ASTHMATIC BRONCHITIS, ACUTE (ICD-466.0) - uses PROAIR as needed, but doing well, breathing OK. ~  7/09:  AB exac Rx w/ Pred, Avelox, Mucinex, etc... symptoms resolved. ~  CXR: baseline film w/ mild cardiomeg, scarring, chr changes, HH seen, & NAD.Marland Kitchen. ~  f/u CXR 6/11 & 7/12>  no change... ~  CXR 11/13 showed borderline cardiomeg, ectasia of the Ao, chronic lung changes/ NAD, DJD/ spondylosis..  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 exam showed bibasilar velcro rales about 1/3rd way up> no change.  HYPERTENSION >> on NORVASC 5mg - 1/2 tab daily... ~  1/13: BP= 160/80 & we decided to start Norvasc5mg /d... subeq cut down to 1/2 tab per phone message. ~  3/13: BP= 154/84 & repeat 124/74; she feels well & denies CP, palpit, SOB, edema, etc; continue same... ~  5/13:  BP= 150/68 & she remains asymptomatic &in good spirits; ok to continue same for now, monitor BP at home. ~  11/13:  BP= 122/70 & she continues to gain strength slowly... ~  11/14: on Amlod5-1/2 daily; BP= 116.62 & she denies CP, palpit, SOB, edema... ~  5/15: on ASA81 & Amlod2.5; BP= 124/70 & similar at home; stable w/ msc & gr1/6 SEM; no CP, palpit, ch in SOB,  edema, etc ~  12/15: on ASA81 & Amlod2.5; BP= 122/62 and stable, doing satis... ~  6/16: on ASA81 & Amlod2.5; BP= 136/80 & similar at home; stable w/ msc & gr1/6 SEM; no CP, palpit, ch in SOB, edema, etc  MITRAL VALVE PROLAPSE (ICD-424.0) - on ASA 81mg /d... had 2DEcho 2/04 showing MVP, mild MR, normal LVF... StressEcho w/ EF=60%... subseq Cardiolite 12/03 was normal w/o ischemia and EF=65%... denies CP, palpit, dizzy, syncope, edema, etc; she is  gradually increasing activities post op... ~  EKG 11/12 showed NSR, rate60, sl incr voltage, NAD...  Right Carotid Bruit>  no cerebral ischemic symptoms; CDoppler 6/16 showed 40-59% RICA stensis, Rx ASA.  HYPERCHOLESTEROLEMIA (ICD-272.0) - controlled on LIPITOR 20mg /d,  and tol well...  ~  labs 7/08 showed TChol 183, TG 111, HDL 65, LDL 96... Doing well, continue Lip20. ~  FLP 11/09 showed TChol 189, TG 70, HDL 71, LDL 104 ~  FLP 5/10 showed TChol 173, TG 53, HDL 75, LDL 87... Remains stable on Lip20. ~  FLP 6/11 showed TChol 183, TG 103, HDL 74, LDL 88 ~  FLP 7/12 showed TChol 182, TG 127, HDL 74, LDL 82... Continues stable on Lip20. ~  FLP 5/13 on Lip20 showed TChol 167, TG 79, HDL 82, LDL 69... Continue same. ~  FLP 5/14 on Lip20 showed TChol 174, TG 64, HDL 78, LDL 83 ~  FLP 5/15 on Lip20 showed TChol 184, TG 84, HDL 83, LDL 84 ~  FLP 6/16 on Lip20 showed TChol 187, TG 83, HDL 93, LDL 78  HIATAL HERNIA (ICD-553.3) & GERD (ICD-530.81) - she has a HH seen on CXR and CT Chest... takes OMEPRAZOLE 20mg /d and denies heartburn, GERD, regurg, dysphagia, etc... pt never had EGD or Colonoscopy> she had repeatedly refused to sched a colonoscopy! ~  Q63mo ROVs>  she continues to deny reflux symptoms, abd pain, N/V, etc>  ADENOCARCINOMA of the RECTUM >> Dx 9/12 via colonoscopy for hematochezia & 11/12 underwent laparoscopic assisted low anterior resection by DrByerly [facility] & left ovarian cystectomy (benign) by Samuel Jester; final path = T2 N0 mod diff adenocarcinoma  invading into but not through the muscularis, no lymphatic invasion, 12 neg LNs, clear margins... ~  6/13:  She continues to f/u w/ DrByerly; c/o some loose stools, otherw ok... ~  11/13: she had f/u appt w/ DrGessner- Colonoscopy 11/13 showed severe divertics on left, mod on right, clean anastomosis... ~  She continues to f/u w/ DrByerly, [facility] (last visit 2/16)> doing satis, just occas bouts of diarrhea & uses Immodium as needed... They did CEA level but we don't have report.  RECURRENT UTIs>  Routine urinalysis w/ WBC, Bact, Leukocyte+, etc... Treated w/ Cipro as required.  DEGENERATIVE JOINT DISEASE (ICD-715.90) - mild in fingers, no other complaints x "I can't make quick turns anymore"... she notes the Tonic Water Qhs works great for her nightime cramps, & on NEURONTIN 100mg - 2Qhs for ?neuropathy symptoms?  OSTEOPENIA (ICD-733.90) - BMDs here in 2003, 2005, 2007- showed osteopenia/ osteoporosis w/ TScores -1.9 to -2.5.Marland KitchenMarland Kitchen on FOSAMAX, Ca++, Vits... ~  labs 11/09 showed Vit D level = 28... rec to take OTC supplement 1000 u daily. ~  BMD here 7/11 showed TScores -2.3 in Lumbar Spine, and -2.3 in right FemNeck... continue Rx. ~  Labs 5/13 showed Vit D level = 54... rec to continue her 1000u supplement... ~  BMD 6/15>  Lowest Tscore is -2.6 in Lumbar spine; she is rec to continue ALENDRONATE 70mg /wk & calcium/ MVI/ VitD supplements.  ANXIETY (ICD-300.00)  Health Maintenance - GYN= DrHenley & follow up [place] w/ neg exam she says and stool cards were neg by her report... [facility] M[facility] 6[facility]10 [facility] was neg... she refuses to consider a colonoscopy... Flu vaccine given each fall... had PNEUMOVAX 2009 & PREVNAR-13 given [id number]... OK TDAP 6/11...   Past Surgical History  Procedure Laterality Date  . Nasal sinus surgery  1994  . Hammer toe surgery  1995    Dr.  Gioffre--left  . Appendectomy    . Colonoscopy  11/27/10    rectal cancer, small sigmoid adenoma, diverticulosis  . Vaginal  hysterectomy  1980's  . Ectopic pregnancy surgery      Midline incision  . Laparoscopic left oophorectomy, laparoscopic low anterior resection, placement of onq pain pump  02/08/2011  . Colon resection  02/08/2011    Procedure: LAPAROSCOPIC SIGMOID COLON RESECTION;  Surgeon: Stark Klein, MD;  Location: [facility];  Service: General;  Laterality: N/A;  Laparoscopic Lower Anterior Bowel Resection  . Ovarian cyst removal  02/08/2011    Procedure: OVARIAN CYSTECTOMY;  Surgeon: Stark Klein, MD;  Location: [facility];  Service: General;  Laterality: Left;  Removal of Left Ovary and Pelvic Mass  . Colon surgery      Outpatient Encounter Prescriptions as of 03/12/2015  Medication Sig  . albuterol (PROVENTIL HFA;VENTOLIN HFA) 108 (90 BASE) MCG/ACT inhaler Inhale 2 puffs into the lungs every 6 (six) hours as needed. Wheezing   . alendronate (FOSAMAX) 70 MG tablet TAKE 1 TABLET BY MOUTH ONCE A WEEK WITH A FULL GLASS OF WATER ON AN EMPTY STOMACH  . amLODipine (NORVASC) 5 MG tablet Take 2.5 mg by mouth daily.  Marland Kitchen ascorbic acid (VITAMIN C) 250 MG CHEW Chew 250 mg by mouth daily.   Marland Kitchen aspirin 81 MG tablet Take 81 mg by mouth every morning.   Marland Kitchen atorvastatin (LIPITOR) 20 MG tablet TAKE 1 TABLET (20 MG TOTAL) BY MOUTH AT BEDTIME.  . Calcium Carbonate-Vitamin D (CALCIUM-VITAMIN D) 500-200 MG-UNIT per tablet Take 2 tablets by mouth daily.   . Cholecalciferol (VITAMIN D3) 1000 UNITS CAPS Take 1 capsule by mouth daily.   . ferrous sulfate 325 (65 FE) MG tablet Take 325 mg by mouth daily with breakfast.  . loperamide (LOPERAMIDE A-D) 2 MG tablet Take 2 mg by mouth every 6 (six) hours as needed.  Marland Kitchen omeprazole (PRILOSEC) 20 MG capsule Take 20 mg by mouth daily.   . vitamin B-12 (CYANOCOBALAMIN) 250 MCG tablet Take 250 mcg by mouth daily.   . vitamin E (VITAMIN E) 200 UNIT capsule Take 200 Units by mouth daily.   . [DISCONTINUED] atorvastatin (LIPITOR) 20 MG tablet TAKE 1 TABLET (20 MG TOTAL) BY MOUTH AT BEDTIME.    Facility-Administered Encounter Medications as of 03/12/2015  Medication  . ondansetron (ZOFRAN) injection    Allergies  Allergen Reactions  . Sulfonamide Derivatives Nausea And Vomiting    Happened a long time ago, does not remember the other reaction she had.    Current Medications, Allergies, Past Medical History, Past Surgical History, Family History, and Social History were reviewed in [employer] record.   Review of Systems         See HPI - all other systems neg except as noted...  The patient denies anorexia, fever, weight loss, weight gain, vision loss, decreased hearing, hoarseness, chest pain, syncope, dyspnea on exertion, peripheral edema, prolonged cough, headaches, hemoptysis, abdominal pain, melena, hematochezia, severe indigestion/heartburn, hematuria, incontinence, muscle weakness, suspicious skin lesions, transient blindness, difficulty walking, depression, unusual weight change, abnormal bleeding, enlarged lymph nodes, and angioedema.   Objective:   Physical Exam     WD, WN, 81 y/o WF in NAD... GENERAL:  Alert & oriented; pleasant & cooperative... HEENT:  [place]/AT, Glasses, EOM-wnl, PERRLA, EACs-clear, TMs-wnl, NOSE-clear, THROAT-clear & wnl. NECK:  Supple w/ fair ROM; no JVD; normal carotid impulses w/ faint right Cbruit; no thyromegaly or nodules palpated; no lymphadenopathy. CHEST:  few bibasilar dry  rales about 1/3rd way up, no wheezing, no rhonchi, no cough, etc... HEART:  Regular Rhythm; without murmurs/ rubs/ or gallops, no msc heard... ABDOMEN:  Surg scar healed, nontender to palp, normal bowel sounds; no organomegaly or masses detected. EXT:  no deformities, mild arthritic changes; no varicose veins/ +venous insuffic/ no edema. NEURO:  CN's intact; no focal neuro deficits... DERM:  No lesions noted; no rash etc...  RADIOLOGY DATA:  Reviewed in the EPIC EMR & discussed w/ the patient...  LABORATORY DATA:  Reviewed in the EPIC EMR  & discussed w/ the patient...   Assessment & Plan:    PULM>  AB, Pulm Fibrosis>  Overall stable on prn Proair; CXR reviewed & resp exam w/o changes, stable.  HBP>  BP improved w/ low dose Amlodipine 2.5mg /d, continue same & monitor at home...  MVP>  She is essent asymptomatic w/o CP, palpit, SOB, edema, etc...  R Carotid Bruit> CDoppler w/ 40-59% RICAstenosis, on ASA...  CHOL>  Stable on Lip20 + diet, continue same...  GI> HH, GERD>  Stable on Prilosec, continue same...  RECTAL CANCER>  As above- she is post op 11/12 & had uneventful rehab; slowly improving and diarrhea is diminished w/ Align/ Activia; she will maintain f/u w/ DrByerly & DrGessner ==> f/u colonoscopy 11/13 w/ divertics, no recurrence.  DJD/ Osteopenia>  She remains on Fosamax, Calcium, MVI, Vit D, etc; she will be due for BMD next yr...  UTI>  Prev treated w/ Cipro empirically & currently asymptomatic...  Anxiety>  Stable & she does not require anxiolytic Rx...   Patient's Medications  New Prescriptions   No medications on file  Previous Medications   ALBUTEROL (PROVENTIL HFA;VENTOLIN HFA) 108 (90 BASE) MCG/ACT INHALER    Inhale 2 puffs into the lungs every 6 (six) hours as needed. Wheezing    ALENDRONATE (FOSAMAX) 70 MG TABLET    TAKE 1 TABLET BY MOUTH ONCE A WEEK WITH A FULL GLASS OF WATER ON AN EMPTY STOMACH   AMLODIPINE (NORVASC) 5 MG TABLET    Take 2.5 mg by mouth daily.   ASCORBIC ACID (VITAMIN C) 250 MG CHEW    Chew 250 mg by mouth daily.    ASPIRIN 81 MG TABLET    Take 81 mg by mouth every morning.    ATORVASTATIN (LIPITOR) 20 MG TABLET    TAKE 1 TABLET (20 MG TOTAL) BY MOUTH AT BEDTIME.   CALCIUM CARBONATE-VITAMIN D (CALCIUM-VITAMIN D) 500-200 MG-UNIT PER TABLET    Take 2 tablets by mouth daily.    CHOLECALCIFEROL (VITAMIN D3) 1000 UNITS CAPS    Take 1 capsule by mouth daily.    FERROUS SULFATE 325 (65 FE) MG TABLET    Take 325 mg by mouth daily with breakfast.   LOPERAMIDE (LOPERAMIDE A-D) 2 MG  TABLET    Take 2 mg by mouth every 6 (six) hours as needed.   OMEPRAZOLE (PRILOSEC) 20 MG CAPSULE    Take 20 mg by mouth daily.    VITAMIN B-12 (CYANOCOBALAMIN) 250 MCG TABLET    Take 250 mcg by mouth daily.    VITAMIN E (VITAMIN E) 200 UNIT CAPSULE    Take 200 Units by mouth daily.   Modified Medications   No medications on file  Discontinued Medications   ATORVASTATIN (LIPITOR) 20 MG TABLET    TAKE 1 TABLET (20 MG TOTAL) BY MOUTH AT BEDTIME.

## 2015-03-12 NOTE — Patient Instructions (Signed)
Today we updated your med list in our EPIC system...    Continue your current medications the same...  Stay as active as possible, and BE CAREFUL...  Call for any questions...  Let's plan a follow up visit in 49mo, sooner if needed for problems.Marland KitchenMarland Kitchen

## 2015-05-05 DIAGNOSIS — Z85048 Personal history of other malignant neoplasm of rectum, rectosigmoid junction, and anus: Secondary | ICD-10-CM | POA: Diagnosis not present

## 2015-08-25 ENCOUNTER — Other Ambulatory Visit: Payer: Self-pay | Admitting: Pulmonary Disease

## 2015-08-25 DIAGNOSIS — I6523 Occlusion and stenosis of bilateral carotid arteries: Secondary | ICD-10-CM

## 2015-08-28 ENCOUNTER — Other Ambulatory Visit: Payer: Self-pay | Admitting: Pulmonary Disease

## 2015-09-04 ENCOUNTER — Ambulatory Visit (HOSPITAL_COMMUNITY)
Admission: RE | Admit: 2015-09-04 | Discharge: 2015-09-04 | Disposition: A | Discharge status: Home / Self Care | Payer: Medicare Other | Source: Ambulatory Visit | Attending: Pulmonary Disease | Admitting: Pulmonary Disease

## 2015-09-04 DIAGNOSIS — I6523 Occlusion and stenosis of bilateral carotid arteries: Secondary | ICD-10-CM | POA: Diagnosis not present

## 2015-09-04 DIAGNOSIS — K219 Gastro-esophageal reflux disease without esophagitis: Secondary | ICD-10-CM | POA: Diagnosis not present

## 2015-09-04 DIAGNOSIS — F419 Anxiety disorder, unspecified: Secondary | ICD-10-CM | POA: Diagnosis not present

## 2015-09-04 DIAGNOSIS — E7800 Pure hypercholesterolemia, unspecified: Secondary | ICD-10-CM | POA: Diagnosis not present

## 2015-09-11 ENCOUNTER — Encounter: Payer: Self-pay | Admitting: Pulmonary Disease

## 2015-09-11 ENCOUNTER — Other Ambulatory Visit (INDEPENDENT_AMBULATORY_CARE_PROVIDER_SITE_OTHER): Payer: Medicare Other

## 2015-09-11 ENCOUNTER — Ambulatory Visit (INDEPENDENT_AMBULATORY_CARE_PROVIDER_SITE_OTHER)
Admission: RE | Admit: 2015-09-11 | Discharge: 2015-09-11 | Disposition: A | Discharge status: Home / Self Care | Payer: Medicare Other | Source: Ambulatory Visit | Attending: Pulmonary Disease | Admitting: Pulmonary Disease

## 2015-09-11 ENCOUNTER — Ambulatory Visit (INDEPENDENT_AMBULATORY_CARE_PROVIDER_SITE_OTHER): Payer: Medicare Other | Admitting: Pulmonary Disease

## 2015-09-11 VITALS — BP 138/70 | HR 40 | Temp 97.6°F | Ht 67.0 in | Wt 132.4 lb

## 2015-09-11 DIAGNOSIS — I059 Rheumatic mitral valve disease, unspecified: Secondary | ICD-10-CM

## 2015-09-11 DIAGNOSIS — I779 Disorder of arteries and arterioles, unspecified: Secondary | ICD-10-CM

## 2015-09-11 DIAGNOSIS — C20 Malignant neoplasm of rectum: Secondary | ICD-10-CM

## 2015-09-11 DIAGNOSIS — I872 Venous insufficiency (chronic) (peripheral): Secondary | ICD-10-CM

## 2015-09-11 DIAGNOSIS — I6523 Occlusion and stenosis of bilateral carotid arteries: Secondary | ICD-10-CM

## 2015-09-11 DIAGNOSIS — R0600 Dyspnea, unspecified: Secondary | ICD-10-CM | POA: Diagnosis not present

## 2015-09-11 DIAGNOSIS — M949 Disorder of cartilage, unspecified: Secondary | ICD-10-CM

## 2015-09-11 DIAGNOSIS — R001 Bradycardia, unspecified: Secondary | ICD-10-CM

## 2015-09-11 DIAGNOSIS — M150 Primary generalized (osteo)arthritis: Secondary | ICD-10-CM

## 2015-09-11 DIAGNOSIS — I10 Essential (primary) hypertension: Secondary | ICD-10-CM

## 2015-09-11 DIAGNOSIS — J8410 Pulmonary fibrosis, unspecified: Secondary | ICD-10-CM

## 2015-09-11 DIAGNOSIS — R609 Edema, unspecified: Secondary | ICD-10-CM

## 2015-09-11 DIAGNOSIS — M899 Disorder of bone, unspecified: Secondary | ICD-10-CM | POA: Diagnosis not present

## 2015-09-11 DIAGNOSIS — G589 Mononeuropathy, unspecified: Secondary | ICD-10-CM

## 2015-09-11 DIAGNOSIS — I739 Peripheral vascular disease, unspecified: Secondary | ICD-10-CM

## 2015-09-11 DIAGNOSIS — I4892 Unspecified atrial flutter: Secondary | ICD-10-CM

## 2015-09-11 DIAGNOSIS — M159 Polyosteoarthritis, unspecified: Secondary | ICD-10-CM

## 2015-09-11 HISTORY — DX: Venous insufficiency (chronic) (peripheral): I87.2

## 2015-09-11 HISTORY — DX: Edema, unspecified: R60.9

## 2015-09-11 LAB — LIPID PANEL
CHOL/HDL RATIO: 2
Cholesterol: 148 mg/dL (ref 0–200)
HDL: 71.9 mg/dL (ref >39.00)
LDL Cholesterol: 61 mg/dL (ref 0–99)
NONHDL: 75.72
Triglycerides: 75 mg/dL (ref 0.0–149.0)
VLDL: 15 mg/dL (ref 0.0–40.0)

## 2015-09-11 LAB — COMPREHENSIVE METABOLIC PANEL
ALT: 15 U/L (ref 0–35)
AST: 24 U/L (ref 0–37)
Albumin: 4.3 g/dL (ref 3.5–5.2)
Alkaline Phosphatase: 69 U/L (ref 39–117)
BUN: 15 mg/dL (ref 6–23)
CO2: 32 meq/L (ref 19–32)
CREATININE: 0.94 mg/dL (ref 0.40–1.20)
Calcium: 9.2 mg/dL (ref 8.4–10.5)
Chloride: 108 mEq/L (ref 96–112)
GFR: 59.34 mL/min — AB (ref >60.00)
Glucose, Bld: 112 mg/dL — ABNORMAL HIGH (ref 70–99)
Potassium: 4.1 mEq/L (ref 3.5–5.1)
SODIUM: 141 meq/L (ref 135–145)
Total Bilirubin: 0.8 mg/dL (ref 0.2–1.2)
Total Protein: 7.6 g/dL (ref 6.0–8.3)

## 2015-09-11 LAB — URINALYSIS
BILIRUBIN URINE: NEGATIVE
HGB URINE DIPSTICK: NEGATIVE
Ketones, ur: NEGATIVE
Leukocytes, UA: NEGATIVE
Nitrite: NEGATIVE
Specific Gravity, Urine: 1.01 (ref 1.000–1.030)
TOTAL PROTEIN, URINE-UPE24: NEGATIVE
URINE GLUCOSE: NEGATIVE
UROBILINOGEN UA: 0.2 (ref 0.0–1.0)
pH: 6 (ref 5.0–8.0)

## 2015-09-11 LAB — CBC WITH DIFFERENTIAL/PLATELET
BASOS ABS: 0 10*3/uL (ref 0.0–0.1)
Basophils Relative: 0.2 % (ref 0.0–3.0)
EOS ABS: 0.1 10*3/uL (ref 0.0–0.7)
Eosinophils Relative: 2 % (ref 0.0–5.0)
HEMATOCRIT: 39.4 % (ref 36.0–46.0)
Hemoglobin: 13 g/dL (ref 12.0–15.0)
LYMPHS ABS: 1.6 10*3/uL (ref 0.7–4.0)
LYMPHS PCT: 24.9 % (ref 12.0–46.0)
MCHC: 33 g/dL (ref 30.0–36.0)
MCV: 98.1 fl (ref 78.0–100.0)
Monocytes Absolute: 0.5 10*3/uL (ref 0.1–1.0)
Monocytes Relative: 8.4 % (ref 3.0–12.0)
NEUTROS ABS: 4.1 10*3/uL (ref 1.4–7.7)
NEUTROS PCT: 64.5 % (ref 43.0–77.0)
Platelets: 194 10*3/uL (ref 150.0–400.0)
RBC: 4.02 Mil/uL (ref 3.87–5.11)
RDW: 14.4 % (ref 11.5–15.5)
WBC: 6.4 10*3/uL (ref 4.0–10.5)

## 2015-09-11 LAB — BRAIN NATRIURETIC PEPTIDE: Pro B Natriuretic peptide (BNP): 857 pg/mL — ABNORMAL HIGH (ref 0.0–100.0)

## 2015-09-11 LAB — TSH: TSH: 4.8 u[IU]/mL — ABNORMAL HIGH (ref 0.35–4.50)

## 2015-09-11 LAB — VITAMIN D 25 HYDROXY (VIT D DEFICIENCY, FRACTURES): VITD: 59.95 ng/mL (ref 30.00–100.00)

## 2015-09-11 MED ORDER — TACROLIMUS 0.1 % EX OINT: TOPICAL_OINTMENT | CUTANEOUS | Status: DC

## 2015-09-11 MED ORDER — FUROSEMIDE 20 MG PO TABS: 20.0000 mg | ORAL_TABLET | Freq: Every day | ORAL | Status: DC

## 2015-09-11 NOTE — Patient Instructions (Signed)
Today we updated your med list in our EPIC system...    Continue your current medications the same...  We are adding a diuretic- LASIX (Furosemide) 20mg  take one tab each AM...    And remember-- NO SALT, ELEVATE LEGS, wear support hoose...  Today we checked a follow up CXR, EKG & LABS...    We will contact you w/ the results when available...   We detected an abnormality on your EKG w/ a slow rate & probable flutter...    We will arrange for a cardiology appt ASAP for further evaluation...  You may need to delay your trip to [place]-- let's see what the heart doctor says...  Call for any questions...   Let's plan a routine follow up visit in 11mo, sooner if needed for problems.Marland KitchenMarland Kitchen

## 2015-09-11 NOTE — Progress Notes (Signed)
Subjective:    Patient ID: Hannah Mercado, female    DOB: Nov 15, 1922, 82 y.o.   MRN: [id number]  HPI 82 y/o WF here for a follow up visit... ~  SEE PREV EPIC NOTES FOR OLDER DATA >>    2DEcho 2/04 showing MVP, mild MR, normal LVF... StressEcho w/ EF=60%... Cardiolite 12/03 was normal w/o ischemia and EF=65%.  Baseline EKG 02/08/11>  NSR, rate60, 1st degree AVB w/ PR=.22  CXR 11/13 showed borderline cardiomeg, ectasia of the Ao, chronic lung changes/ NAD, DJD/ spondylosis...  LABS 11/13:  Chems- wnl;  CBC- wnl;  UA- ok...   LABS 5/14:  FLP- at goals on Lip20;  Chems- wnl;  CBC- wnl;  TSH=2.61;  VitD=56;  UA- few wbc, no bact...  CXR => done 6/15>  Borderline cardiomeg, prominent PAs, calcif Ao arch, COPD w/ sl incr markings/ NAD, DJD in Tspine...  LABS 5/15:  FLP- within parameters on Lip20;  Chems- wnl;  CBC- ok w/ Hg=12.6;  TSH=2.41;  VitD=55 on 1000u daily...  BMD => done 6/15>  Lowest Tscore is -2.6 in Lumbar spine; she is rec to continue ALENDRONATE 70mg /wk & calcium/ MVI/ VitD supplements...  ~  August 28, 2014:  63mo ROV & [name] will soon be 28!  Planning a trip to [place] to visit daugh in Aug;  She does crosswords everyday!  She reports doing satis, no new complaints or concerns- taking nutritional supplements & wt is up a few lbs...    Hx AB, pulm fibrosis> on ProventilHFA prn (rarely uses); no resp symptoms & breathing good, & she denies cough, phlegm, SOB, etc; CXR w/o acute changes...    MVP, HBP> on ASA81 & Amlod2.5; BP= 136/80 & similar at home; stable w/ msc & gr1/6 SEM; no CP, palpit, ch in SOB, edema, etc...    Right carotid bruit> no cerebral ischemic symptoms; CDoppler 6/16 showed 40-59% RICA stensis, Rx ASA...    Chol> remains on Lip20 & FLP 6/16 looks great w/ TChol 187, TG 83, HDL 93, LDL 78    HH, GERD> on Prilosec20mg /d + antireflux regimen in view of her HH etc; she denies abd pain, n/v, notes intermit diarrhea, no blood seen...    Hx rectal cancer> s/p lap low  anterior resection 11/12 by DrByerly; f/u colonoscopy 11/13 by DrGessner- mod divertics, anastomosis ok; she sees DrByerly Q6-3mo- notes reviewed still w/ occas loose stool (uses Immodium prn); on Align & Activia; she's avoiding lactose & gas is improved, improved after nutrition consult;  CEA level 2/15 = 3.2 (stable); CEA 2/16 by DrByerly & we don't have report.    DJD, Osteopenia> on Fosamax70/wk along w/ Ca, MVI, VitD; last BMD 6/15 w/ lowest Tscore-2.6 (continue same rx), she is getting along well but hasn't played much golf since her surg...    Other problems as noted... She likes to take a number of supplements... We reviewed prob list, meds, xrays and labs> see below for updates >>   CXR 6/16 showed borderline heart size, hyperinflated lungs, icr markings bilat, NAD/ no change...  LABS 6/16:  FLP- at goals on Atorva20;  Chems- wnl;  CBC- wnl;  TSH=3.86;  UA- clear... NOTE> CEA done 2/16 by DrByerly & we do not have results.  CDopplers 6/16 showed irreg mixed plaque bilat, 40-59% RICAstenosis & 123456 LICAstenosis, norm subclav & antegrade vertebrals..The current medical regimen is effective;  continue present plan and medications. ASA daily.  ~  March 12, 2015:  36mo ROV & [name] turned 24 this  past summer- surprise party by her 2 children w/ 40 people & she had a blast; she is going to family on [place] for 74mo over the holidays; clinically doing very well- feels good, still does crosswords daily, no new complaints or concerns... She is reminded to drink 1-2 can Ensure/ Sustacal/ Boost daily to help her gain wt...     She has a ProairHFA inhaler but hasn't needed it in yrs she says; breathing stable w/ her chr lung dis- Hx AB & pulm fibrosis, she denies much cough/ sput/ CP/ SOB...     BP & MVP remain stable on low dose Amlodipine 2.5mg /d; BP= 128/80 & she remains relatively asymptomatic; we reviewed exercise program...     Chol is treated w/ Lip20 & last FLP 6/16 looked good w/ TChol 187, TG  83, HDL 93, LDL 78; she has lost wt down to 123# w/ BMI=20 5 encouraged to eat more + supplement...    Hx adenoca of rectum, s/p surg 11/12- followed by DrByerly & DrGessner, stable & no known recurrence...    Hx DJD, osteopenia on Fosamax, VitD, MVI, & OTC analgesics as needed...    Nocturnal leg cramps> she thinks neuropathy but tonic water & prev Quinine rx helped...  We reviewed prob list, meds, xrays and labs>  IMP/PLAN>>  Hannah Mercado is doing remarkably well at age 42;  We discussed continuing the same meds, and incr nutrtitional supplements;  We will plan ROV w/ CXR & Labs in 76mo...  ~  September 11, 2015:  16mo ROV & pulmonary/ medical follow up visit>  Hannah Mercado reports a good 70mo interval but notes LE edema x 3-4wks;  She has gained 9# from her baseline 67mo ago (123#) to 132# today assoc w/ 2+pitting edema in both legs which is new; she denies dietary sodium excess or prev hx of edema=> see below as we review the following medical problems during today's office visit >>     Hx AB, pulm fibrosis> on ProventilHFA prn (rarely uses); no resp symptoms & breathing good, & she denies cough, phlegm, SOB, etc; CXR today shows cardiomeg & interstitial edema...    MVP, HBP> on ASA81 & Amlod2.5; BP= 138/70 but pulse is slow today w/ HR ~40 & she denies CP, palpit, dizzy/ syncope/ near-syncope, change in DOE etc...    Cardiac arrhythmia> new finding on today's EKG=> ?Flutter w/ slow VR, rate40; she has the new edema, interstitial changes on CXR & BNP=857 => referred to [facility] ASAP for eval...    Right carotid bruit> no cerebral ischemic symptoms; CDoppler 6/17 shows no change in 40-59% RICA stenosis, Rx ASA...    Chol> remains on Lip20 & FLP 6/17 looks great w/ TChol 148, TG 75, HDL 72, LDL 61    HH, GERD> on Prilosec20mg /d + antireflux regimen in view of her HH etc; she denies abd pain, n/v, notes intermit diarrhea, no blood seen...    Hx rectal cancer> s/p lap low anterior resection 11/12 by DrByerly; f/u colonoscopy  11/13 by DrGessner- mod divertics, anastomosis ok; she sees DrByerly Q101mo- notes reviewed still w/ occas loose stool (uses Immodium prn); on Align & Activia; she's avoiding lactose & gas is improved, improved after nutrition consult;  CEA level 2/15 = 3.2 (stable); CEA 2/16 & 2/17 by DrByerly & we don't have report.    DJD, Osteopenia> on Fosamax70/wk along w/ Ca, MVI, VitD; last BMD 6/15 w/ lowest Tscore-2.6 (continue same rx), she is getting along well but hasn't played much golf since  her surg...    Other problems as noted... She likes to take a number of supplements... EXAM shows Afeb, VSS x pulse=40; O2sat=96% on RA; Wt=132#, BMI=15;  HEENT- neg, mallampati1, R carotid bruit;  Chest- decr BS w/ few scat rhonchi, bibasilar rales- unchanged;  Heart- brady rate ~40, gr 1-2/6 SEM w/o r/g;  Abd- thin soft, neg;  Ext- VI, 2+edema in LEs w/o c/c;  Neuro- intact...  CDoppler 09/04/15>  Heterogeneous plaque bilat, stable 40-595 RICA stenosis & 123456 LICA stenosis, subclav & vertebrals ok...  CXR 09/11/15>  Mod cardiomegaly, incr interstitial markings suggesting poss edema, increased markings at bases...  EKG 09/11/15>  Bradycardia, rate ~40, ?AFlutter, poor R progression V1-2...  LABS 09/11/15>  FLP- all parameters at goals on Lip20;  Chems- wnl w/ Cr=0.94, BS=112, BNP=857;  CBC- wnl;  TSH=4.80;  VitD=60;  UA- clear...  IMP/PLAN>>  While still essentially asymptomatic at age 28, Hannah Mercado has developed 2+pitting edema in LEs over the last 3weeks or so & has a bradycardia on EKG w/ ?Flutter & slow VR, only on Amlod2.5, BNP is elev at 857=> we will start Lasix20 & refer for Cards eval ASAP           Problem List:   pt's cell # [phone]  ALLERGY (ICD-995.3) - uses OTC meds as needed.  ASTHMATIC BRONCHITIS, ACUTE (ICD-466.0) - uses PROAIR as needed, but doing well, breathing OK. ~  7/09:  AB exac Rx w/ Pred, Avelox, Mucinex, etc... symptoms resolved. ~  CXR: baseline film w/ mild cardiomeg, scarring,  chr changes, HH seen, & NAD.Marland Kitchen. ~  f/u CXR 6/11 & 7/12>  no change... ~  CXR 11/13 showed borderline cardiomeg, ectasia of the Ao, chronic lung changes/ NAD, DJD/ spondylosis..  ~  CXR 6/15>  Borderline cardiomeg, prominent PAs, calcif Ao arch, COPD w/ sl incr markings/ NAD, DJD in Tspine ~  CXR 6/16 showed borderline heart size, hyperinflated lungs, icr markings bilat, NAD/ no change ~  CXR 6/17 showed mod cardiomegaly, incr interstitial markings suggesting poss edema, increased markings at bases...  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 exam showed bibasilar velcro rales at bases...  HYPERTENSION >> on NORVASC 5mg - 1/2 tab daily... ~  1/13: BP= 160/80 & we decided to start Norvasc5mg /d... subeq cut down to 1/2 tab per phone message. ~  3/13: BP= 154/84 & repeat 124/74; she feels well & denies CP, palpit, SOB, edema, etc; continue same... ~  5/13:  BP= 150/68 & she remains asymptomatic &in good spirits; ok to continue same for now, monitor BP at home. ~  11/13:  BP= 122/70 & she continues to gain strength slowly... ~  11/14: on Amlod5-1/2 daily; BP= 116.62 & she denies CP, palpit, SOB, edema... ~  5/15: on ASA81 & Amlod2.5; BP= 124/70 & similar at home; stable w/ msc & gr1/6 SEM; no CP, palpit, ch in SOB, edema, etc ~  12/15: on ASA81 & Amlod2.5; BP= 122/62 and stable, doing satis... ~  6/16: on ASA81 & Amlod2.5; BP= 136/80 & similar at home; stable w/ msc & gr1/6 SEM; no CP, palpit, ch in SOB, edema, etc ~  6/17: n ASA81 & Amlod2.5; BP= 138/70 but pulse is slow today w/ HR ~40 & she denies CP, palpit, dizzy/ syncope/ near-syncope, change in DOE etc...  MITRAL VALVE PROLAPSE (ICD-424.0) - on ASA 81mg /d... had 2DEcho 2/04 showing MVP, mild MR, normal LVF... StressEcho w/ EF=60%.Marland KitchenMarland Kitchen  subseq Cardiolite 12/03 was normal w/o ischemia and EF=65%... denies CP, palpit, dizzy, syncope, edema, etc; she is gradually increasing  activities post op... ~  EKG 11/12 showed NSR, rate60, sl incr voltage, NAD... ~  6/17> Cardiac arrhythmia> new finding on today's EKG=> ?Flutter w/ slow VR, rate40; she has the new edema, interstitial changes on CXR & BNP=857 => referred to [facility]DS ASAP for eval...  Right Carotid Bruit>  no cerebral ischemic symptoms; CDoppler 6/16 showed 40-59% RICA stenosis, Rx ASA. ~  no cerebral ischemic symptoms; CDoppler 6/17 shows no change in 40-59% RICA stenosis, Rx ASA...  HYPERCHOLESTEROLEMIA (ICD-272.0) - controlled on LIPITOR 20mg /d,  and tol well...  ~  labs 7/08 showed TChol 183, TG 111, HDL 65, LDL 96... Doing well, continue Lip20. ~  FLP 11/09 showed TChol 189, TG 70, HDL 71, LDL 104 ~  FLP 5/10 showed TChol 173, TG 53, HDL 75, LDL 87... Remains stable on Lip20. ~  FLP 6/11 showed TChol 183, TG 103, HDL 74, LDL 88 ~  FLP 7/12 showed TChol 182, TG 127, HDL 74, LDL 82... Continues stable on Lip20. ~  FLP 5/13 on Lip20 showed TChol 167, TG 79, HDL 82, LDL 69... Continue same. ~  FLP 5/14 on Lip20 showed TChol 174, TG 64, HDL 78, LDL 83 ~  FLP 5/15 on Lip20 showed TChol 184, TG 84, HDL 83, LDL 84 ~  FLP 6/16 on Lip20 showed TChol 187, TG 83, HDL 93, LDL 78 ~  FLP 6/17 on Lip20 showed TChol 148, TG 75, HDL 72, LDL 61  HIATAL HERNIA (ICD-553.3) & GERD (ICD-530.81) - she has a HH seen on CXR and CT Chest... takes OMEPRAZOLE 20mg /d and denies heartburn, GERD, regurg, dysphagia, etc... pt never had EGD or Colonoscopy> she had repeatedly refused to sched a colonoscopy! ~  Q27mo ROVs>  she continues to deny reflux symptoms, abd pain, N/V, etc>  ADENOCARCINOMA of the RECTUM >> Dx 9/12 via colonoscopy for hematochezia & 11/12 underwent laparoscopic assisted low anterior resection by DrByerly [facility] & left ovarian cystectomy (benign) by Samuel Jester; final path = T2 N0 mod diff adenocarcinoma invading into but not through the muscularis, no lymphatic invasion, 12 neg LNs, clear margins... ~  6/13:  She continues to  f/u w/ DrByerly; c/o some loose stools, otherw ok... ~  11/13: she had f/u appt w/ DrGessner- Colonoscopy 11/13 showed severe divertics on left, mod on right, clean anastomosis... ~  She continues to f/u w/ DrByerly, [facility] (last visit 2/16)> doing satis, just occas bouts of diarrhea & uses Immodium as needed... They did CEA level but we don't have report.  RECURRENT UTIs>  Routine urinalysis w/ WBC, Bact, Leukocyte+, etc... Treated w/ Cipro as required.  DEGENERATIVE JOINT DISEASE (ICD-715.90) - mild in fingers, no other complaints x "I can't make quick turns anymore"... she notes the Tonic Water Qhs works great for her nightime cramps, & on NEURONTIN 100mg - 2Qhs for ?neuropathy symptoms?  OSTEOPENIA (ICD-733.90) - BMDs here in 2003, 2005, 2007- showed osteopenia/ osteoporosis w/ TScores -1.9 to -2.5.Marland KitchenMarland Kitchen on FOSAMAX, Ca++, Vits... ~  labs 11/09 showed Vit D level = 28... rec to take OTC supplement 1000 u daily. ~  BMD here 7/11 showed TScores -2.3 in Lumbar Spine, and -2.3 in right FemNeck... continue Rx. ~  Labs 5/13 showed Vit D level = 54... rec to continue her 1000u supplement... ~  BMD 6/15>  Lowest Tscore is -2.6 in Lumbar spine; she is rec to continue ALENDRONATE 70mg /wk & calcium/  MVI/ VitD supplements.  ANXIETY (ICD-300.00)  Health Maintenance - GYN= DrHenley & follow up [place] w/ neg exam she says and stool cards were neg by her report... last M[facility] 6[facility]10 [facility] was neg... she refuses to consider a colonoscopy... Flu vaccine given each fall... had PNEUMOVAX 2009 & PREVNAR-13 given [id number]... OK TDAP 6/11...   Past Surgical History  Procedure Laterality Date  . Nasal sinus surgery  1994  . Hammer toe surgery  1995    Dr. Silverio Decamp  . Appendectomy    . Colonoscopy  11/27/10    rectal cancer, small sigmoid adenoma, diverticulosis  . Vaginal hysterectomy  1980's  . Ectopic pregnancy surgery      Midline incision  . Laparoscopic left oophorectomy, laparoscopic low anterior  resection, placement of onq pain pump  02/08/2011  . Colon resection  02/08/2011    Procedure: LAPAROSCOPIC SIGMOID COLON RESECTION;  Surgeon: Stark Klein, MD;  Location: [facility];  Service: General;  Laterality: N/A;  Laparoscopic Lower Anterior Bowel Resection  . Ovarian cyst removal  02/08/2011    Procedure: OVARIAN CYSTECTOMY;  Surgeon: Stark Klein, MD;  Location: [facility];  Service: General;  Laterality: Left;  Removal of Left Ovary and Pelvic Mass  . Colon surgery      Outpatient Encounter Prescriptions as of 09/11/2015  Medication Sig  . albuterol (PROVENTIL HFA;VENTOLIN HFA) 108 (90 BASE) MCG/ACT inhaler Inhale 2 puffs into the lungs every 6 (six) hours as needed. Wheezing   . alendronate (FOSAMAX) 70 MG tablet TAKE 1 TABLET BY MOUTH ONCE A WEEK WITH A FULL GLASS OF WATER ON AN EMPTY STOMACH  . amLODipine (NORVASC) 5 MG tablet Take 2.5 mg by mouth daily.  Marland Kitchen ascorbic acid (VITAMIN C) 250 MG CHEW Chew 250 mg by mouth daily.   Marland Kitchen aspirin 81 MG tablet Take 81 mg by mouth every morning.   Marland Kitchen atorvastatin (LIPITOR) 20 MG tablet TAKE 1 TABLET (20 MG TOTAL) BY MOUTH AT BEDTIME.  . Calcium Carbonate-Vitamin D (CALCIUM-VITAMIN D) 500-200 MG-UNIT per tablet Take 2 tablets by mouth daily.   . Cholecalciferol (VITAMIN D3) 1000 UNITS CAPS Take 1 capsule by mouth daily.   . ferrous sulfate 325 (65 FE) MG tablet Take 325 mg by mouth daily with breakfast.  . loperamide (LOPERAMIDE A-D) 2 MG tablet Take 2 mg by mouth every 6 (six) hours as needed.  Marland Kitchen omeprazole (PRILOSEC) 20 MG capsule Take 20 mg by mouth daily.   . vitamin B-12 (CYANOCOBALAMIN) 250 MCG tablet Take 250 mcg by mouth daily.   . vitamin E (VITAMIN E) 200 UNIT capsule Take 200 Units by mouth daily.   . furosemide (LASIX) 20 MG tablet Take 1 tablet (20 mg total) by mouth daily.  . tacrolimus (PROTOPIC) 0.1 % ointment Take as directed   Facility-Administered Encounter Medications as of 09/11/2015  Medication  . ondansetron (ZOFRAN) injection     Allergies  Allergen Reactions  . Sulfonamide Derivatives Nausea And Vomiting    Happened a long time ago, does not remember the other reaction she had.    Current Medications, Allergies, Past Medical History, Past Surgical History, Family History, and Social History were reviewed in [employer] record.   Review of Systems         See HPI - all other systems neg except as noted...  The patient denies anorexia, fever, weight loss, weight gain, vision loss, decreased hearing, hoarseness, chest pain, syncope, dyspnea on exertion, peripheral edema, prolonged cough, headaches, hemoptysis, abdominal  pain, melena, hematochezia, severe indigestion/heartburn, hematuria, incontinence, muscle weakness, suspicious skin lesions, transient blindness, difficulty walking, depression, unusual weight change, abnormal bleeding, enlarged lymph nodes, and angioedema.   Objective:   Physical Exam     WD, WN, 82 y/o WF in NAD... GENERAL:  Alert & oriented; pleasant & cooperative... HEENT:  [place]/AT, Glasses, EOM-wnl, PERRLA, EACs-clear, TMs-wnl, NOSE-clear, THROAT-clear & wnl. NECK:  Supple w/ fair ROM; no JVD; normal carotid impulses w/ faint right Cbruit; no thyromegaly or nodules palpated; no lymphadenopathy. CHEST:  few bibasilar dry rales about 1/3rd way up, no wheezing, no rhonchi, no cough, etc... HEART:  Regular bradycardia ~40/min; Gr1/6 SEM without rubs or gallops, no msc heard... ABDOMEN:  Surg scar healed, nontender to palp, normal bowel sounds; no organomegaly or masses detected. EXT:  no deformities, mild arthritic changes; no varicose veins/ +venous insuffic/ 1-2+ edema found on 09/11/15 NEURO:  CN's intact; no focal neuro deficits... DERM:  No lesions noted; no rash etc...  RADIOLOGY DATA:  Reviewed in the EPIC EMR & discussed w/ the patient...  LABORATORY DATA:  Reviewed in the EPIC EMR & discussed w/ the patient...   Assessment & Plan:    NEW BRADYARRHYTHMIA/  LE Edema/ BNP=857> ?flutter w/ slow VR, we will start Lasix20 & refer to Cards ASAP for further eval   PULM>  AB, Pulm Fibrosis>  Overall stable on prn Proair; CXR reviewed & resp exam w/o changes, stable.  HBP>  BP improved w/ low dose Amlodipine 2.5mg /d, continue same & monitor at home...  MVP>  She is essent asymptomatic w/o CP, palpit, SOB, etc...  R Carotid Bruit> CDoppler w/ 40-59% RICAstenosis, on ASA...  CHOL>  Stable on Lip20 + diet, continue same...  GI> HH, GERD>  Stable on Prilosec, continue same...  RECTAL CANCER>  As above- she is post op 11/12 & had uneventful rehab; slowly improving and diarrhea is diminished w/ Align/ Activia; she will maintain f/u w/ DrByerly & DrGessner ==> f/u colonoscopy 11/13 w/ divertics, no recurrence.  DJD/ Osteopenia>  She remains on Fosamax, Calcium, MVI, Vit D, etc; she will be due for BMD next yr...  UTI>  Prev treated w/ Cipro empirically & currently asymptomatic...  Anxiety>  Stable & she does not require anxiolytic Rx...   Patient's Medications  New Prescriptions   FUROSEMIDE (LASIX) 20 MG TABLET    Take 1 tablet (20 mg total) by mouth daily.   TACROLIMUS (PROTOPIC) 0.1 % OINTMENT    Take as directed  Previous Medications   ALBUTEROL (PROVENTIL HFA;VENTOLIN HFA) 108 (90 BASE) MCG/ACT INHALER    Inhale 2 puffs into the lungs every 6 (six) hours as needed. Wheezing    ALENDRONATE (FOSAMAX) 70 MG TABLET    TAKE 1 TABLET BY MOUTH ONCE A WEEK WITH A FULL GLASS OF WATER ON AN EMPTY STOMACH   AMLODIPINE (NORVASC) 5 MG TABLET    Take 2.5 mg by mouth daily.   ASCORBIC ACID (VITAMIN C) 250 MG CHEW    Chew 250 mg by mouth daily.    ASPIRIN 81 MG TABLET    Take 81 mg by mouth every morning.    ATORVASTATIN (LIPITOR) 20 MG TABLET    TAKE 1 TABLET (20 MG TOTAL) BY MOUTH AT BEDTIME.   CALCIUM CARBONATE-VITAMIN D (CALCIUM-VITAMIN D) 500-200 MG-UNIT PER TABLET    Take 2 tablets by mouth daily.    CHOLECALCIFEROL (VITAMIN D3) 1000 UNITS CAPS    Take  1 capsule by mouth daily.    FERROUS  SULFATE 325 (65 FE) MG TABLET    Take 325 mg by mouth daily with breakfast.   LOPERAMIDE (LOPERAMIDE A-D) 2 MG TABLET    Take 2 mg by mouth every 6 (six) hours as needed.   OMEPRAZOLE (PRILOSEC) 20 MG CAPSULE    Take 20 mg by mouth daily.    VITAMIN B-12 (CYANOCOBALAMIN) 250 MCG TABLET    Take 250 mcg by mouth daily.    VITAMIN E (VITAMIN E) 200 UNIT CAPSULE    Take 200 Units by mouth daily.   Modified Medications   No medications on file  Discontinued Medications   No medications on file

## 2015-09-12 ENCOUNTER — Encounter: Payer: Self-pay | Admitting: Internal Medicine

## 2015-09-12 ENCOUNTER — Ambulatory Visit (INDEPENDENT_AMBULATORY_CARE_PROVIDER_SITE_OTHER): Payer: Medicare Other | Admitting: Internal Medicine

## 2015-09-12 VITALS — BP 138/84 | HR 64 | Ht 67.0 in | Wt 129.4 lb

## 2015-09-12 DIAGNOSIS — R600 Localized edema: Secondary | ICD-10-CM

## 2015-09-12 LAB — BASIC METABOLIC PANEL WITH GFR
BUN: 12 mg/dL (ref 7–25)
CO2: 26 mmol/L (ref 20–31)
Calcium: 9.2 mg/dL (ref 8.6–10.4)
Chloride: 103 mmol/L (ref 98–110)
Creat: 0.94 mg/dL — ABNORMAL HIGH (ref 0.60–0.88)
Glucose, Bld: 87 mg/dL (ref 65–99)
Potassium: 4.2 mmol/L (ref 3.5–5.3)
Sodium: 144 mmol/L (ref 135–146)

## 2015-09-12 LAB — T4, FREE: FREE T4: 1 ng/dL (ref 0.8–1.8)

## 2015-09-12 LAB — T3, FREE: T3, Free: 2.9 pg/mL (ref 2.3–4.2)

## 2015-09-12 NOTE — Progress Notes (Addendum)
Cardiology Office Note   Date:  09/12/2015   ID:  Hannah Mercado, DOB 03/23/1922, MRN [id number]  PCP:  Noralee Space, MD  Cardiologist:   Dorris Carnes, MD   Pt referred by Jeannine Kitten for atrial fib     History of Present Illness: Hannah Mercado is a 82 y.o. female who was referred by Jeannine Kitten for atrial fib/flutter The pt was seen by Dr Lenna Gilford for edema  She says over the past few wks she has developed swelling in her legss  No change in diet  No CP Breathing is OK  No palpitatiosn  No PND In clinic EKG showed Atrial fib with a ventricular rate of 39 bpm She was given lasix for edema  Has taken and says that swelling is starting to go down     Outpatient Prescriptions Prior to Visit  Medication Sig Dispense Refill  . albuterol (PROVENTIL HFA;VENTOLIN HFA) 108 (90 BASE) MCG/ACT inhaler Inhale 2 puffs into the lungs every 6 (six) hours as needed. Wheezing     . alendronate (FOSAMAX) 70 MG tablet TAKE 1 TABLET BY MOUTH ONCE A WEEK WITH A FULL GLASS OF WATER ON AN EMPTY STOMACH 4 tablet 5  . amLODipine (NORVASC) 5 MG tablet Take 2.5 mg by mouth daily.    Marland Kitchen ascorbic acid (VITAMIN C) 250 MG CHEW Chew 250 mg by mouth daily.     Marland Kitchen aspirin 81 MG tablet Take 81 mg by mouth every morning.     Marland Kitchen atorvastatin (LIPITOR) 20 MG tablet TAKE 1 TABLET (20 MG TOTAL) BY MOUTH AT BEDTIME. 90 tablet 2  . Calcium Carbonate-Vitamin D (CALCIUM-VITAMIN D) 500-200 MG-UNIT per tablet Take 2 tablets by mouth daily.     . Cholecalciferol (VITAMIN D3) 1000 UNITS CAPS Take 1 capsule by mouth daily.     . ferrous sulfate 325 (65 FE) MG tablet Take 325 mg by mouth daily with breakfast.    . furosemide (LASIX) 20 MG tablet Take 1 tablet (20 mg total) by mouth daily. 30 tablet 11  . loperamide (LOPERAMIDE A-D) 2 MG tablet Take 2 mg by mouth every 6 (six) hours as needed.    Marland Kitchen omeprazole (PRILOSEC) 20 MG capsule Take 20 mg by mouth daily.     . tacrolimus (PROTOPIC) 0.1 % ointment Take as directed 100 g 0  . vitamin  B-12 (CYANOCOBALAMIN) 250 MCG tablet Take 250 mcg by mouth daily.     . vitamin E (VITAMIN E) 200 UNIT capsule Take 200 Units by mouth daily.      Facility-Administered Medications Prior to Visit  Medication Dose Route Frequency Provider Last Rate Last Dose  . ondansetron (ZOFRAN) injection    PRN Ofilia Neas, CRNA   1 mg at 02/08/11 0915     Allergies:   Sulfonamide derivatives   Past Medical History  Diagnosis Date  . Allergy history unknown   . History of pneumonia     right middle lobe  . Pulmonary fibrosis, postinflammatory ([place])     pt denies   . Mitral valve prolapse     pt denies   . Hypercholesteremia   . Hiatal hernia   . GERD (gastroesophageal reflux disease)   . DJD (degenerative joint disease)   . Osteopenia   . Anxiety   . Asthmatic bronchitis     use inhaler prn   . Blood transfusion     ? with ectopic pregnancy 50 yrs ago   . Rectal cancer ([place])  Past Surgical History  Procedure Laterality Date  . Nasal sinus surgery  1994  . Hammer toe surgery  1995    Dr. Silverio Decamp  . Appendectomy    . Colonoscopy  11/27/10    rectal cancer, small sigmoid adenoma, diverticulosis  . Vaginal hysterectomy  1980's  . Ectopic pregnancy surgery      Midline incision  . Laparoscopic left oophorectomy, laparoscopic low anterior resection, placement of onq pain pump  02/08/2011  . Colon resection  02/08/2011    Procedure: LAPAROSCOPIC SIGMOID COLON RESECTION;  Surgeon: Stark Klein, MD;  Location: [facility];  Service: General;  Laterality: N/A;  Laparoscopic Lower Anterior Bowel Resection  . Ovarian cyst removal  02/08/2011    Procedure: OVARIAN CYSTECTOMY;  Surgeon: Stark Klein, MD;  Location: [facility];  Service: General;  Laterality: Left;  Removal of Left Ovary and Pelvic Mass  . Colon surgery       Social History:  The patient  reports that she has never smoked. She has never used smokeless tobacco. She reports that she does not drink alcohol or use illicit drugs.     Family History:  The patient's family history includes Heart disease in her father; Lung cancer in her brother. There is no history of Colon cancer.    ROS:  Please see the history of present illness. All other systems are reviewed and  Negative to the above problem except as noted.    PHYSICAL EXAM: VS:  BP 138/84 mmHg  Pulse 64  Ht 5\' 7"  (1.702 m)  Wt 129 lb 6.4 oz (58.695 kg)  BMI 20.26 kg/m2  GEN: Well nourished, well developed, in no acute distress HEENT: normal Neck: no JVD, carotid bruits, or masses Cardiac: RRR; no murmurs, rubs, or gallops, 1+ edema  Respiratory:  clear to auscultation bilaterally, normal work of breathing GI: soft, nontender, nondistended, + BS  No hepatomegaly  MS: no deformity Moving all extremities   Skin: warm and dry, no rash Neuro:  Strength and sensation are intact Psych: euthymic mood, full affect   EKG:  EKG is ordered today.  Atrial fibrillation with CHB, ventricular escape     Lipid Panel    Component Value Date/Time   CHOL 148 09/11/2015 1023   TRIG 75.0 09/11/2015 1023   HDL 71.90 09/11/2015 1023   CHOLHDL 2 09/11/2015 1023   VLDL 15.0 09/11/2015 1023   LDLCALC 61 09/11/2015 1023      Wt Readings from Last 3 Encounters:  09/12/15 129 lb 6.4 oz (58.695 kg)  09/11/15 132 lb 6.4 oz (60.056 kg)  03/12/15 123 lb 3.2 oz (55.883 kg)      ASSESSMENT AND PLAN:  Pt is a 82 yo with new atrial fibrillation with CHB  Slow ventricular rate.  I tried to walk her with pulse ox but could not pick up signal She has some edema but otherwise feels good  No SOB NO CP  No dizziness I discussed above with her   1  At the least she should be on anticoaguation (if labs OK)  2  She should be referred to EP for consideration of PPM The pt says that she has a big trip planned to [place] for 2 months  DOes not want to do anything now  Due to leave in 1 wk. I will review with EP    Labs today  Call pt after results.  Set up for echo to eval LVEF and atrial  size    Signed, Nevin Bloodgood  Hannah Challenger, MD  09/12/2015 2:44 PM    [address], [place], [place]  [address] Phone: [phone]; Fax: [phone]   09/15/2015  COntacted pt  EP thought pt would probably need a pacmeaker  No emergent  May need before comes home in 2 months   Would anticoagulate with Eliquis WOuld recomm 2.5 mg bid since she weighs less that 60 kg and she is 82 yo. Should have f/u CBC once at least while in [place] Will contact daughter Blair Promise  [phone]  Dorris Carnes

## 2015-09-12 NOTE — Patient Instructions (Signed)
Medication Instructions:  Your physician recommends that you continue on your current medications as directed. Please refer to the Current Medication list given to you today.  Labwork: Ft4/ft3/bmet today   Testing/Procedures: Your physician has requested that you have an echocardiogram. Echocardiography is a painless test that uses sound waves to create images of your heart. It provides your doctor with information about the size and shape of your heart and how well your heart's chambers and valves are working. This procedure takes approximately one hour. There are no restrictions for this procedure.  Follow-Up: Will determine after Dr Harrington Challenger reviews your Echo   If you need a refill on your cardiac medications before your next appointment, please call your pharmacy.

## 2015-09-15 ENCOUNTER — Telehealth: Payer: Self-pay | Admitting: *Deleted

## 2015-09-15 NOTE — Telephone Encounter (Signed)
09/15/2015  COntacted pt EP thought pt would probably need a pacmeaker No emergent May need before comes home in 2 months  Would anticoagulate with Eliquis WOuld recomm 2.5 mg bid since she weighs less that 60 kg and she is 82 yo. Should have f/u CBC once at least while in [place] Will contact daughter Blair Promise [phone]  Dorris Carnes  Above copied from addendum message sent to triage from Dr. Harrington Challenger.  I reviewed with pharmacist regarding timing of lab work.  Per pharmacist pt should have CBC and BMP one month after starting Eliquis.  I placed call to pt's daughter at number above and left message to call back

## 2015-09-16 ENCOUNTER — Telehealth: Payer: Self-pay | Admitting: Internal Medicine

## 2015-09-16 ENCOUNTER — Ambulatory Visit (HOSPITAL_COMMUNITY): Payer: Medicare Other | Attending: Cardiology

## 2015-09-16 ENCOUNTER — Other Ambulatory Visit: Payer: Self-pay

## 2015-09-16 DIAGNOSIS — R600 Localized edema: Secondary | ICD-10-CM | POA: Diagnosis not present

## 2015-09-16 DIAGNOSIS — E7800 Pure hypercholesterolemia, unspecified: Secondary | ICD-10-CM | POA: Insufficient documentation

## 2015-09-16 DIAGNOSIS — I341 Nonrheumatic mitral (valve) prolapse: Secondary | ICD-10-CM | POA: Diagnosis not present

## 2015-09-16 DIAGNOSIS — I351 Nonrheumatic aortic (valve) insufficiency: Secondary | ICD-10-CM | POA: Insufficient documentation

## 2015-09-16 DIAGNOSIS — I517 Cardiomegaly: Secondary | ICD-10-CM | POA: Insufficient documentation

## 2015-09-16 DIAGNOSIS — I340 Nonrheumatic mitral (valve) insufficiency: Secondary | ICD-10-CM | POA: Insufficient documentation

## 2015-09-16 LAB — ECHOCARDIOGRAM COMPLETE
AVLVOTPG: 2 mmHg
AVPHT: 752 ms
CHL CUP MV DEC (S): 211
EWDT: 211 ms
FS: 39 % (ref 28–44)
IV/PV OW: 1.01
LA ID, A-P, ES: 49 mm
LA diam index: 2.95 cm/m2
LA vol: 130 mL
LAVOLA4C: 112 mL
LAVOLIN: 78.2 mL/m2
LEFT ATRIUM END SYS DIAM: 49 mm
LV PW d: 12.3 mm — AB (ref 0.6–1.1)
LVOT SV: 52 mL
LVOT VTI: 16.5 cm
LVOT area: 3.14 cm2
LVOT diameter: 20 mm
LVOTPV: 78.5 cm/s
MV Peak grad: 6 mmHg
MV pk E vel: 124 m/s
Reg peak vel: 402 cm/s
TRMAXVEL: 402 cm/s

## 2015-09-16 NOTE — Telephone Encounter (Signed)
Spoke with pt's daughter Hannah Mercado. She is aware that Dr. Harrington Challenger spoke with "EP and though pt probable need a pacemaker, no emergency . may need before comes home in 2 months"  Pt's daughter Hannah Mercado states that she would like for pt to have the pacemaker prior going to visit her. Daughter wants it to be done ASAP. When pt is safe to go she can visit then in [place].  Sheryl would like to be called when the procedure is scheduled, so she can come, and she is able to drive pt to the procedure.  Note: to Dr Harrington Challenger to clarify "when does pt needs to start Eliquis 2.5 mg"

## 2015-09-16 NOTE — Telephone Encounter (Signed)
Follow Up Call   Blair Promise is returning a call about her  mother please call back on [phone] .   Thanks

## 2015-09-16 NOTE — Telephone Encounter (Signed)
Patient would like for Dr. Harrington Challenger to call her daughter, [name], with the echocardiogram results from today.  She expressed that she hoped it would be this afternoon.  DPR is on file for [employer]

## 2015-09-17 MED ORDER — APIXABAN 2.5 MG PO TABS: 2.5000 mg | ORAL_TABLET | Freq: Two times a day (BID) | ORAL | Status: DC

## 2015-09-17 NOTE — Telephone Encounter (Signed)
Copied from another open encounter:   Rosalyn Gess at 09/17/2015 12:57 PM     Status: Signed       Expand All Collapse All   New Message  Pt calling returning phone call- lab results/echo Please call back and discuss.           This was a call from patient's daughter, [name]. Advised echo result has not been reviewed by Dr. Harrington Challenger yet, and that we will call patient with results when it is.  Did advise that her pumping function is normal.  --daughter, [name], requests a call with echo results as well.--  Consult with Dr. Curt Bears scheduled for September 29, 2015 at 9:00 am. Dtr is aware of this appointment and thankful for the call back. The patient is also aware of this appointment and appreciative for the information provided.

## 2015-09-17 NOTE — Telephone Encounter (Signed)
I spoke with the patient. Advised I am sending prescription for Eliquis 2.5 mg BID to her pharmacy.  She will pick up and start tonight.  She reiterated that she would like to schedule pacemaker procedure ASAP and will postpone her trip to [place] until she has recovered. Advised that I will call her back after I discuss with EP nurse/scheduler.   She is very appreciative for this information.

## 2015-09-17 NOTE — Telephone Encounter (Signed)
New Message  Pt calling returning phone call- lab results/echo Please call back and discuss.

## 2015-09-17 NOTE — Telephone Encounter (Signed)
Contacted daughter  Reviewed atrial fib, bradycardia, echo findings (LVEF normal; moderate MR, LAE) Note appt for 7/10 with [facility].   Continue Eliquis

## 2015-09-17 NOTE — Telephone Encounter (Signed)
I contacted the [facility] and was told prior auth is required for Eliquis.

## 2015-09-17 NOTE — Telephone Encounter (Signed)
F/u  Pt stated that when she arrived at pharmacy to pick up new Rx it had not been authorized and she was not able to take it home. the patient requested to speak w/rN. Please call back and discuss.

## 2015-09-18 ENCOUNTER — Telehealth: Payer: Self-pay

## 2015-09-18 ENCOUNTER — Telehealth: Payer: Self-pay | Admitting: Internal Medicine

## 2015-09-18 NOTE — Telephone Encounter (Signed)
Spoke with patient today, to let her know Eliquis 2.5mg  was approved. She was thankful, and was going to pick it up this A.M.

## 2015-09-18 NOTE — Telephone Encounter (Signed)
Prior auth for Eliquis 2.5 obtained from [facility]. PA- [id number], is good through 03/21/2016.

## 2015-09-18 NOTE — Telephone Encounter (Signed)
New message   Pt is requesting a call back from the rn she said she is unsure which medication she should take and how much  Asked pt for more information she said she wants the rn to call her back

## 2015-09-18 NOTE — Telephone Encounter (Signed)
Spoke with patient, she wanted to be sure it is ok to take Eliquis with furosemide. Advised her there is no contraindication to taking them at the same time in the AM is she wants to. She is appreciative for this information.

## 2015-09-19 ENCOUNTER — Telehealth: Payer: Self-pay | Admitting: Pulmonary Disease

## 2015-09-19 NOTE — Telephone Encounter (Signed)
Spoke with Hannah Mercado and advised that there is no PA form in [employer] or [employer] folders. Asked her to have PA refaxed. She will send today. Routing to [place] for follow up.

## 2015-09-24 NOTE — Telephone Encounter (Signed)
[facility]ed [facility] and they state the number to call to start PA is >> [phone]. I called [facility] and was told that they could not find the pt in their system. I have left a message with the pt to verify her insurance.

## 2015-09-24 NOTE — Addendum Note (Signed)
Addended by: Freada Bergeron on: 09/24/2015 05:14 PM   Modules accepted: Orders

## 2015-09-25 ENCOUNTER — Institutional Professional Consult (permissible substitution): Payer: Medicare Other | Admitting: Cardiology

## 2015-09-25 NOTE — Telephone Encounter (Signed)
lmtcb x2 for pt. 

## 2015-09-26 NOTE — Telephone Encounter (Signed)
Spoke with pt. She states that she does not want this cream at this time. No need to do a PA. States that she is going to [facility] and get some names of other creams that won't be so expensive or that are covered by her insurance. Message will be closed at this time.

## 2015-09-28 NOTE — Progress Notes (Signed)
Electrophysiology Office Note   Date:  09/29/2015   ID:  Hannah Mercado, DOB Aug 22, 1922, MRN [id number]  PCP:  Noralee Space, MD  Cardiologist:  Harrington Challenger Primary Electrophysiologist:  Glendene Wyer Meredith Leeds, MD    Chief Complaint  Patient presents with  . Advice Only     History of Present Illness: Hannah Mercado is a 82 y.o. female who presents today for electrophysiology evaluation.   History of AF/flutter, HLD.  Was seen in cardiology clinic 09/12/15 with AF and a HR of 39.  She is currently feeling she says that she is able to do all of her daily activities. She lives alone in her townhouse, and walks up and down stairs, but use the elevator when she feels like she needs it.  She was told by her primary physician and she was in atrial fibrillation, and she was surprised to hear that she was out of rhythm.   Today, she denies symptoms of palpitations, chest pain, shortness of breath, orthopnea, PND, lower extremity edema, claudication, dizziness, presyncope, syncope, bleeding, or neurologic sequela. The patient is tolerating medications without difficulties and is otherwise without complaint today.    Past Medical History  Diagnosis Date  . Allergy history unknown   . History of pneumonia     right middle lobe  . Pulmonary fibrosis, postinflammatory ([place])     pt denies   . Mitral valve prolapse     pt denies   . Hypercholesteremia   . Hiatal hernia   . GERD (gastroesophageal reflux disease)   . DJD (degenerative joint disease)   . Osteopenia   . Anxiety   . Asthmatic bronchitis     use inhaler prn   . Blood transfusion     ? with ectopic pregnancy 50 yrs ago   . Rectal cancer [facility])    Past Surgical History  Procedure Laterality Date  . Nasal sinus surgery  1994  . Hammer toe surgery  1995    Dr. Silverio Decamp  . Appendectomy    . Colonoscopy  11/27/10    rectal cancer, small sigmoid adenoma, diverticulosis  . Vaginal hysterectomy  1980's  . Ectopic pregnancy  surgery      Midline incision  . Laparoscopic left oophorectomy, laparoscopic low anterior resection, placement of onq pain pump  02/08/2011  . Colon resection  02/08/2011    Procedure: LAPAROSCOPIC SIGMOID COLON RESECTION;  Surgeon: Stark Klein, MD;  Location: [facility];  Service: General;  Laterality: N/A;  Laparoscopic Lower Anterior Bowel Resection  . Ovarian cyst removal  02/08/2011    Procedure: OVARIAN CYSTECTOMY;  Surgeon: Stark Klein, MD;  Location: [facility];  Service: General;  Laterality: Left;  Removal of Left Ovary and Pelvic Mass  . Colon surgery       Current Outpatient Prescriptions  Medication Sig Dispense Refill  . albuterol (PROVENTIL HFA;VENTOLIN HFA) 108 (90 BASE) MCG/ACT inhaler Inhale 2 puffs into the lungs every 6 (six) hours as needed. Wheezing     . alendronate (FOSAMAX) 70 MG tablet TAKE 1 TABLET BY MOUTH ONCE A WEEK WITH A FULL GLASS OF WATER ON AN EMPTY STOMACH 4 tablet 5  . amLODipine (NORVASC) 5 MG tablet Take 2.5 mg by mouth daily.    Marland Kitchen apixaban (ELIQUIS) 2.5 MG TABS tablet Take 1 tablet (2.5 mg total) by mouth 2 (two) times daily. 60 tablet 6  . ascorbic acid (VITAMIN C) 250 MG CHEW Chew 250 mg by mouth daily.     Marland Kitchen aspirin  81 MG tablet Take 81 mg by mouth every morning.     Marland Kitchen atorvastatin (LIPITOR) 20 MG tablet TAKE 1 TABLET (20 MG TOTAL) BY MOUTH AT BEDTIME. 90 tablet 2  . Calcium Carbonate-Vitamin D (CALCIUM-VITAMIN D) 500-200 MG-UNIT per tablet Take 2 tablets by mouth daily.     . Cholecalciferol (VITAMIN D3) 1000 UNITS CAPS Take 1 capsule by mouth daily.     . ferrous sulfate 325 (65 FE) MG tablet Take 325 mg by mouth daily with breakfast.    . furosemide (LASIX) 20 MG tablet Take 1 tablet (20 mg total) by mouth daily. 30 tablet 11  . loperamide (LOPERAMIDE A-D) 2 MG tablet Take 2 mg by mouth every 6 (six) hours as needed.    Marland Kitchen omeprazole (PRILOSEC) 20 MG capsule Take 20 mg by mouth daily.     . tacrolimus (PROTOPIC) 0.1 % ointment Take as directed 100 g  0  . vitamin B-12 (CYANOCOBALAMIN) 250 MCG tablet Take 250 mcg by mouth daily.     . vitamin E (VITAMIN E) 200 UNIT capsule Take 200 Units by mouth daily.      No current facility-administered medications for this visit.   Facility-Administered Medications Ordered in Other Visits  Medication Dose Route Frequency Provider Last Rate Last Dose  . ondansetron (ZOFRAN) injection    PRN Ofilia Neas, CRNA   1 mg at 02/08/11 0915    Allergies:   Sulfonamide derivatives   Social History:  The patient  reports that she has never smoked. She has never used smokeless tobacco. She reports that she does not drink alcohol or use illicit drugs.   Family History:  The patient's family history includes Heart disease in her father; Lung cancer in her brother. There is no history of Colon cancer.    ROS:  Please see the history of present illness.   Otherwise, review of systems is positive for palpitations, leg swelling.   All other systems are reviewed and negative.    PHYSICAL EXAM: VS:  BP 180/90 mmHg  Pulse 50  Ht 5\' 6"  (1.676 m)  Wt 123 lb 12.8 oz (56.155 kg)  BMI 19.99 kg/m2 , BMI Body mass index is 19.99 kg/(m^2). GEN: Well nourished, well developed, in no acute distress HEENT: normal Neck: no JVD, carotid bruits, or masses Cardiac: regular, bradycardic; no murmurs, rubs, or gallops,no edema  Respiratory:  clear to auscultation bilaterally, normal work of breathing GI: soft, nontender, nondistended, + BS MS: no deformity or atrophy Skin: warm and dry Neuro:  Strength and sensation are intact Psych: euthymic mood, full affect  EKG:  EKG is not ordered today.   Recent Labs: 09/11/2015: ALT 15; Hemoglobin 13.0; Platelets 194.0; Pro B Natriuretic peptide (BNP) 857.0*; TSH 4.80* 09/12/2015: BUN 12; Creat 0.94*; Potassium 4.2; Sodium 144    Lipid Panel     Component Value Date/Time   CHOL 148 09/11/2015 1023   TRIG 75.0 09/11/2015 1023   HDL 71.90 09/11/2015 1023   CHOLHDL 2  09/11/2015 1023   VLDL 15.0 09/11/2015 1023   LDLCALC 61 09/11/2015 1023     Wt Readings from Last 3 Encounters:  09/29/15 123 lb 12.8 oz (56.155 kg)  09/12/15 129 lb 6.4 oz (58.695 kg)  09/11/15 132 lb 6.4 oz (60.056 kg)      Other studies Reviewed: Additional studies/ records that were reviewed today include: TTE 09/16/15  Review of the above records today demonstrates:  - Left ventricle: The cavity size was normal. There  was mild  concentric hypertrophy. Systolic function was normal. The  estimated ejection fraction was in the range of 55% to 60%. Wall  motion was normal; there were no regional wall motion  abnormalities. The study is not technically sufficient to allow  evaluation of LV diastolic function. - Aortic valve: Trileaflet; mildly thickened, mildly calcified  leaflets. There was mild regurgitation. - Mitral valve: Calcified annulus. Moderate prolapse, involving the  anterior leaflet. There was moderate regurgitation directed  posteriorly. - Left atrium: The atrium was severely dilated. - Right atrium: The atrium was severely dilated. - Tricuspid valve: There was moderate regurgitation. - Pulmonary arteries: Systolic pressure was severely increased. PA  peak pressure: 80 mm Hg (S).   ASSESSMENT AND PLAN:  1.  Atrial fibrillation: is on anticoagulation with Eliquis but no rate control.  Was in complete heart block on her last clinic check. Athalee Esterline not start any rate controlling medications at this time.  Since she has been feeling well without any complaints and has not had any changes in energy or dyspnea on exertion, have decided that remaining in atrial fibrillation is likely the best option at this time.   This patients CHA2DS2-VASc Score and unadjusted Ischemic Stroke Rate (% per year) is equal to 4.8 % stroke rate/year from a score of 4  Above score calculated as 1 point each if present [CHF, HTN, DM, Vascular=MI/PAD/Aortic Plaque, Age if 65-74, or  Female] Above score calculated as 2 points each if present [Age > 75, or Stroke/TIA/TE]  2. Complete heart block: Currently bradycardic and has evidence of complete heart block on her previous EKGs. I did discuss with her the options of pacemaker placement. We discussed the risks and benefits. Risks include, but are not limited to bleeding, infection, tamponade, and pneumothorax. She understands these risks and has agreed to a pacemaker being placed.  3. Hypertension: Blood pressure quite elevated today on initial check. On recheck, her blood pressures come down into the 160s. I have asked her to check her blood pressure at home, as this may need to be treated in the future.   Current medicines are reviewed at length with the patient today.   The patient does not have concerns regarding her medicines.  The following changes were made today:  none  Labs/ tests ordered today include:  No orders of the defined types were placed in this encounter.     Disposition:   FU with Adie Vilar 3 months  Signed, Adessa Primiano Meredith Leeds, MD  09/29/2015 9:36 AM     [facility] [address] [place] [address] [phone] (office) [phone] (fax)

## 2015-09-29 ENCOUNTER — Telehealth: Payer: Self-pay | Admitting: Cardiology

## 2015-09-29 ENCOUNTER — Encounter: Payer: Self-pay | Admitting: Cardiology

## 2015-09-29 ENCOUNTER — Institutional Professional Consult (permissible substitution): Payer: Medicare Other | Admitting: Cardiology

## 2015-09-29 ENCOUNTER — Ambulatory Visit (INDEPENDENT_AMBULATORY_CARE_PROVIDER_SITE_OTHER): Payer: Medicare Other | Admitting: Cardiology

## 2015-09-29 VITALS — BP 180/90 | HR 50 | Ht 66.0 in | Wt 123.8 lb

## 2015-09-29 DIAGNOSIS — I481 Persistent atrial fibrillation: Secondary | ICD-10-CM

## 2015-09-29 DIAGNOSIS — Z01812 Encounter for preprocedural laboratory examination: Secondary | ICD-10-CM | POA: Diagnosis not present

## 2015-09-29 DIAGNOSIS — I4819 Other persistent atrial fibrillation: Secondary | ICD-10-CM

## 2015-09-29 NOTE — Telephone Encounter (Signed)
Explained that is why we asked patient to monitor her BPs and call us if remains elevated (her SBP dropped to 150 before letting her leave the office). Explained that since she is new to our practice and we don't know her normal BPs, we want to ensure they aren't staying elevated or if this was just a possible "white coat syndrome". Explained that is why we instructed her to call if they remain elevated after leaving the office so that we may add BP med if necessary. Advised that I would call her on Friday, if I haven't heard from her, to check on her numbers. Patient verbalized understanding and agreeable to plan.

## 2015-09-29 NOTE — Patient Instructions (Addendum)
Medication Instructions:  Your physician recommends that you continue on your current medications as directed. Please refer to the Current Medication list given to you today.  Labwork: Pre procedure labs to be scheduled once your procedure date is scheduled.  Testing/Procedures: Your physician has recommended that you have a pacemaker inserted. A pacemaker is a small device that is placed under the skin of your chest or abdomen to help control abnormal heart rhythms. This device uses electrical pulses to prompt the heart to beat at a normal rate. Pacemakers are used to treat heart rhythms that are too slow. Wire (leads) are attached to the pacemaker that goes into the chambers of you heart. This is done in the hospital and usually requires and overnight stay.  Please call the office when you are ready to schedule this procedure.   Ask for Trinidad Curet, RN [phone]  Available dates:  7/17, 7/20, 7/24, 7/27, 7/31, 8/3, 8/9,  8/16  Follow-Up: To be determined once procedure is scheduled.  If you need a refill on your cardiac medications before your next appointment, please call your pharmacy.  Thank you for choosing [facility]!!   Trinidad Curet, RN [phone]  Any Other Special Instructions Will Be Listed Below (If Applicable). - Please monitor your blood pressure at home.  Please call if remains elevated.  Pacemaker Implantation The heart has its own electrical system, or natural pacemaker, to regulate the heartbeat. Sometimes, the natural pacemaker system of the heart fails and causes the heart to beat too slowly. If this happens, a pacemaker can be surgically placed to help the heart beat at a normal or programmed rate. A pacemaker is a small, battery-powered device that is placed under the skin and is programmed to sense your heartbeats. If your heart rate is lower than the programmed rate, the pacemaker will pace your heart. Parts of a pacemaker include:  Wires or leads. The  leads are placed in the heart and transmit electricity to the heart. The leads are connected to the pulse generator.  Pulse generator. The pulse generator contains a computer and a memory system. The pulse generator also produces the electrical signal that triggers the heart to beat. A pacemaker may be placed if:  You have a slow heartbeat (bradycardia).  You have fainting (syncope).  Shortness of breath (dyspnea) due to heart problems. LET [facility] CARE PROVIDER KNOW ABOUT:  Any allergies you may have.  All medicines you are taking, including vitamins, herbs, eye drops, creams, and over-the-counter medicines.  Previous problems you or members of your family have had with the use of anesthetics.  Any blood disorders you have.  Previous surgeries you have had.  Medical conditions you have.  Possibility of pregnancy, if this applies. RISKS AND COMPLICATIONS Generally, pacemaker implantation is a safe procedure. However, problems can occur and include:  Bleeding.  Unable to place the pacemaker under local sedation.  Infection. BEFORE THE PROCEDURE  You will have blood work drawn before the procedure.  Do not use any tobacco products including cigarettes, chewing tobacco, or electronic cigarettes. If you need help quitting, ask your health care provider.  Do not eat or drink anything after midnight on the night before the procedure or as directed by your health care provider.  Ask your health care provider about:  Changing or stopping your regular medicines. This is especially important if you are taking diabetes medicines or blood thinners.  Taking medicines such as aspirin and ibuprofen. These medicines can thin your  blood. Do not take these medicines before your procedure if your health care provider asks you not to.  Ask your health care provider if you can take a sip of water with any approved medicines the morning of the procedure. PROCEDURE  The surgery to place  a pacemaker is considered a minimally invasive surgical procedure. It is done under a local anesthetic, which is an injection at the incision site that makes the skin numb. You are also given sedation and pain medicine that makes you drowsy during the procedure.   An intravenous line (IV) will be started in your hand or arm so sedation and pain medicine can be given during the pacemaker procedure.  A numbing medicine will be injected into the skin where the pacemaker is to be placed. A small incision will then be made into the skin. The pacemaker is usually placed under the skin near the collarbone.  After the incision has been made, the leads will be inserted into a large vein and guided into the heart using X-ray.  Using the same incision that was used to place the leads, a small pocket will be created under the skin to hold the pulse generator. The leads will then be connected to the pulse generator.  The incision site will then be closed. A bandage (dressing) is placed over the pacemaker site. The dressing is removed 24-48 hours afterward. AFTER THE PROCEDURE  You will be taken to a recovery area after the pacemaker implant. Your vital signs such as blood pressure, heart rate, breathing, and oxygen levels will be monitored.  A chest X-ray will be done after the pacemaker has been implanted. This is to make sure the pacemaker and leads are in the correct place.   This information is not intended to replace advice given to you by your health care provider. Make sure you discuss any questions you have with your health care provider.   Document Released: 02/26/2002 Document Revised: 03/29/2014 Document Reviewed: 07/13/2011 Elsevier Interactive Patient Education 2016 [employer]    Pacemaker Implantation, Care After Refer to this sheet over the next few weeks. These instructions provide you with information on caring for yourself after the procedure. Your health care provider may also give  you more specific instructions. Your treatment has been planned according to current medical practices, but problems sometimes occur. Call your health care provider if you have any problems or questions regarding your pacemaker.  WHAT TO EXPECT AFTER THE PROCEDURE  You may feel pain. Some pain is normal. It may last a few days.  A slight bump may be seen over the skin where the device was placed. Sometimes, it is possible to feel the device under the skin. This is normal.  In the months and years afterward, your health care provider will check the device, the leads, and the battery every few months. Eventually, when the battery is low, the device will be replaced. HOME CARE INSTRUCTIONS Medicines  Take medicines only as directed by your health care provider.  If you were prescribed an antibiotic medicine, finish it all even if you start to feel better.  Do not take any other medicines without asking your health care provider first. Some medicines, including certain painkillers, can cause bleeding in your stomach after surgery. Wound Care  Do not remove the bandage on your chest until directed to do so by your health care provider.  After your bandage is removed, you may see pieces of tape called skin adhesive strips over  the area where the cut was made (incision site). Let them fall off on their own.  Check the incision site every day to make sure it is not infected, bleeding, or starting to pull apart.  Do not use lotions or ointments near the incision site unless directed to do so.  Keep the incision area clean and dry for 2-3 days after the procedure or as directed by your health care provider. It takes several weeks for the incision site to completely heal.  Do not take baths, swim, or use a hot tub until your health care provider approves. Activities  Try to walk a little every day. Exercising is important after this procedure. It is also important to use your shoulder on the side  of the pacemaker in daily tasks that do not require exaggerated motion.  Avoid sudden jerking, pulling, or chopping movements that pull your upper arm far away from your body for at least 6 weeks.  Do not lift your upper arm above your shoulders for at least 6 weeks. This means no tennis, golf, or swimming for this period of time. If you sleep with the arm above your head, use a restraint to prevent this from happening as you sleep.  You may go back to work when your health care provider says it is okay. Check with your health care provider before you start to drive or play sports. Other Instructions  Follow diet instructions if they were provided. You should be able to eat what you usually do right away, but you may need to limit your salt intake.  Weigh yourself every day. If you suddenly gain weight, fluid may be building up in your body.  Always carry your pacemaker identification card with you. The card should list the implant date, device model, and manufacturer. Consider wearing a medical alert bracelet or necklace.  Tell all health care providers that you have a pacemaker. This may prevent them from giving you a magnetic resource imaging scan (MRI) because of the strong magnets used during that test.  If you must pass through a metal detector, quickly walk through it. Do not stop under the detector or stand near it.  Avoid places or objects with a strong electric or magnetic field, including:  Engineer, maintenance. When at the airport, let officials know you have a pacemaker. Your ID card will let you be checked in a way that is safe for you and that will not damage your pacemaker. Also, do not let a security person wave a magnetic wand near your pacemaker. That can make it stop working.  Power plants.  Large electrical generators.  Radiofrequency transmission towers, such as cell phone and radio towers.  Do not use amateur (ham) radio equipment or electric (arc) welding  torches. Some devices are safe to use if held at least 1 foot from your pacemaker. These include power tools, lawn mowers, and speakers. If you are unsure of whether something is safe to use, ask your health care provider.  You may safely use electric blankets, heating pads, computers, and microwave ovens.  When using your cell phone, hold it to the ear opposite the pacemaker. Do not leave your cell phone in a pocket over the pacemaker.  Keep all follow-up visits as directed by your health care provider. This is how your health care provider makes sure your chest is healing the way it should. Ask your health care provider when you should come back to have your stitches or staples  taken out.  Have your pacemaker checked every 3-6 months or as directed by your health care provider. Most pacemakers last for 4-8 years before a new one is needed. SEEK MEDICAL CARE IF:  You gain weight suddenly.  Your legs or feet swell more than they have before.  It feels like your heart is fluttering or skipping beats (heart palpitations).  You have a fever. SEEK IMMEDIATE MEDICAL CARE IF:  You have chest pain.  You feel more short of breath than you have felt before.  You feel more light-headed than you have felt before.  You have problems with your incision site, such as swelling or bleeding, or it starts to open up.  You have drainage, redness, swelling, or pain at your incision site.   This information is not intended to replace advice given to you by your health care provider. Make sure you discuss any questions you have with your health care provider.   Document Released: 09/25/2004 Document Revised: 03/29/2014 Document Reviewed: 07/09/2011 Elsevier Interactive Patient Education [employer].

## 2015-09-29 NOTE — Telephone Encounter (Signed)
New Message:  Pt says her blood pressure was high when she was her today. She talked to her daughter,she wonder if pt might need something called in for her blood pressure? Please call to advise.

## 2015-09-30 ENCOUNTER — Telehealth: Payer: Self-pay | Admitting: Cardiology

## 2015-09-30 NOTE — Telephone Encounter (Signed)
Follow Up:   Pt says 10-16-15 is a good date for her.

## 2015-09-30 NOTE — Telephone Encounter (Signed)
Informed patient I would schedule procedure and call her by Thursday to review instructions. She is agreeable to plan.

## 2015-09-30 NOTE — Telephone Encounter (Signed)
New message    The pt wants to speak with a nurse regarding the pacemaker placement.

## 2015-10-02 ENCOUNTER — Encounter: Payer: Self-pay | Admitting: *Deleted

## 2015-10-02 ENCOUNTER — Telehealth: Payer: Self-pay | Admitting: Cardiology

## 2015-10-02 MED ORDER — AMLODIPINE BESYLATE 5 MG PO TABS: 5.0000 mg | ORAL_TABLET | Freq: Every day | ORAL | Status: DC

## 2015-10-02 NOTE — Telephone Encounter (Signed)
Patient reports BPs the last 2 days (taken at [facility]): Yesterday morning - 181/101, HR 68 This morning - 196/76, HR 53  She understands I will forward this to Dr. Curt Bears for advisement and call her w/ recommendation/s. Also advised that typically Dr. Curt Bears does not follow BP, but he will advise on starting med and probably recommend f/u w/ PCP for further monitoring. She is agreeable.

## 2015-10-02 NOTE — Telephone Encounter (Signed)
Patient reports she is not taking Amlodipine and hasn't in years. Advised to start Amlodipine 5 mg (ok per Camnitz) daily. Advised to recheck this weekend and call back to us/PCP. She is agreeable and tells me she will me back Monday with an update. States she feels better since talking with me.

## 2015-10-02 NOTE — Telephone Encounter (Signed)
New Message    Pt c/o BP issue: STAT if pt c/o blurred vision, one-sided weakness or slurred speech  1. What are your last 5 BP readings? Yesterday 181/101 and today 196/76  2. Are you having any other symptoms (ex. Dizziness, headache, blurred vision, passed out)? Weakness, light-headed and wobbly legs  3. What is your BP issue? Pt states her bp is high please advise

## 2015-10-02 NOTE — Telephone Encounter (Signed)
Increase her norvasc to 10 mg.  Have her recheck this weekend and call back to [place] or her PCP.  Should avoid beta blockers and calcium channel blockers.

## 2015-10-02 NOTE — Telephone Encounter (Signed)
PPM scheduled for 7/27. Pre procedure labs 7/20. Letter of instructions reviewed with patient and left at front desk for her to pick up when she comes in for pre labs. Surgical scrub instructions & scrub also left at front desk with procedure letter of instructions. Patient verbalized understanding and agreeable to plan.

## 2015-10-09 ENCOUNTER — Other Ambulatory Visit (INDEPENDENT_AMBULATORY_CARE_PROVIDER_SITE_OTHER): Payer: Medicare Other | Admitting: *Deleted

## 2015-10-09 DIAGNOSIS — Z01812 Encounter for preprocedural laboratory examination: Secondary | ICD-10-CM | POA: Diagnosis not present

## 2015-10-09 DIAGNOSIS — Z79899 Other long term (current) drug therapy: Secondary | ICD-10-CM | POA: Diagnosis not present

## 2015-10-09 LAB — CBC WITH DIFFERENTIAL/PLATELET
BASOS PCT: 0 %
Basophils Absolute: 0 cells/uL (ref 0–200)
Eosinophils Absolute: 59 cells/uL (ref 15–500)
Eosinophils Relative: 1 %
HCT: 39.1 % (ref 35.0–45.0)
Hemoglobin: 13.2 g/dL (ref 11.7–15.5)
LYMPHS PCT: 26 %
Lymphs Abs: 1534 cells/uL (ref 850–3900)
MCH: 32.7 pg (ref 27.0–33.0)
MCHC: 33.8 g/dL (ref 32.0–36.0)
MCV: 96.8 fL (ref 80.0–100.0)
MONOS PCT: 8 %
MPV: 10.3 fL (ref 7.5–12.5)
Monocytes Absolute: 472 cells/uL (ref 200–950)
NEUTROS PCT: 65 %
Neutro Abs: 3835 cells/uL (ref 1500–7800)
PLATELETS: 196 10*3/uL (ref 140–400)
RBC: 4.04 MIL/uL (ref 3.80–5.10)
RDW: 13.7 % (ref 11.0–15.0)
WBC: 5.9 10*3/uL (ref 3.8–10.8)

## 2015-10-09 LAB — BASIC METABOLIC PANEL
BUN: 21 mg/dL (ref 7–25)
CHLORIDE: 101 mmol/L (ref 98–110)
CO2: 29 mmol/L (ref 20–31)
CREATININE: 1.08 mg/dL — AB (ref 0.60–0.88)
Calcium: 9 mg/dL (ref 8.6–10.4)
Glucose, Bld: 90 mg/dL (ref 65–99)
Potassium: 4.1 mmol/L (ref 3.5–5.3)
Sodium: 140 mmol/L (ref 135–146)

## 2015-10-09 NOTE — Addendum Note (Signed)
Addended by: Eulis Foster on: 10/09/2015 11:30 AM   Modules accepted: Orders

## 2015-10-16 ENCOUNTER — Ambulatory Visit (HOSPITAL_COMMUNITY)
Admission: RE | Admit: 2015-10-16 | Discharge: 2015-10-17 | Disposition: A | Discharge status: Home / Self Care | Payer: Medicare Other | Source: Ambulatory Visit | Attending: Cardiology | Admitting: Cardiology

## 2015-10-16 ENCOUNTER — Encounter (HOSPITAL_COMMUNITY): Payer: Self-pay | Admitting: Cardiology

## 2015-10-16 ENCOUNTER — Encounter (HOSPITAL_COMMUNITY)
Admission: RE | Disposition: A | Discharge status: Home / Self Care | Payer: Self-pay | Source: Ambulatory Visit | Attending: Cardiology

## 2015-10-16 DIAGNOSIS — I442 Atrioventricular block, complete: Secondary | ICD-10-CM | POA: Insufficient documentation

## 2015-10-16 DIAGNOSIS — K219 Gastro-esophageal reflux disease without esophagitis: Secondary | ICD-10-CM | POA: Insufficient documentation

## 2015-10-16 DIAGNOSIS — Z95818 Presence of other cardiac implants and grafts: Secondary | ICD-10-CM

## 2015-10-16 DIAGNOSIS — I10 Essential (primary) hypertension: Secondary | ICD-10-CM | POA: Diagnosis not present

## 2015-10-16 DIAGNOSIS — I517 Cardiomegaly: Secondary | ICD-10-CM | POA: Insufficient documentation

## 2015-10-16 DIAGNOSIS — M1990 Unspecified osteoarthritis, unspecified site: Secondary | ICD-10-CM | POA: Diagnosis not present

## 2015-10-16 DIAGNOSIS — Z882 Allergy status to sulfonamides status: Secondary | ICD-10-CM | POA: Diagnosis not present

## 2015-10-16 DIAGNOSIS — J449 Chronic obstructive pulmonary disease, unspecified: Secondary | ICD-10-CM | POA: Diagnosis not present

## 2015-10-16 DIAGNOSIS — I482 Chronic atrial fibrillation: Secondary | ICD-10-CM | POA: Insufficient documentation

## 2015-10-16 DIAGNOSIS — I4819 Other persistent atrial fibrillation: Secondary | ICD-10-CM

## 2015-10-16 DIAGNOSIS — I4891 Unspecified atrial fibrillation: Secondary | ICD-10-CM

## 2015-10-16 DIAGNOSIS — E785 Hyperlipidemia, unspecified: Secondary | ICD-10-CM | POA: Diagnosis not present

## 2015-10-16 HISTORY — DX: Unspecified atrial fibrillation: I48.91

## 2015-10-16 HISTORY — PX: EP IMPLANTABLE DEVICE: SHX172B

## 2015-10-16 LAB — SURGICAL PCR SCREEN
MRSA, PCR: NEGATIVE
Staphylococcus aureus: NEGATIVE

## 2015-10-16 SURGERY — PACEMAKER IMPLANT

## 2015-10-16 MED ORDER — MUPIROCIN 2 % EX OINT
1.0000 "application " | TOPICAL_OINTMENT | Freq: Once | CUTANEOUS | Status: AC
  Administered 2015-10-16: 1 via TOPICAL
  Filled 2015-10-16: qty 22

## 2015-10-16 MED ORDER — HEPARIN (PORCINE) IN NACL 2-0.9 UNIT/ML-% IJ SOLN
INTRAMUSCULAR | Status: AC
  Filled 2015-10-16: qty 500

## 2015-10-16 MED ORDER — HYDRALAZINE HCL 20 MG/ML IJ SOLN
10.0000 mg | INTRAMUSCULAR | Status: DC | PRN
  Administered 2015-10-16: 10 mg via INTRAVENOUS
  Filled 2015-10-16: qty 1

## 2015-10-16 MED ORDER — AMLODIPINE BESYLATE 5 MG PO TABS
5.0000 mg | ORAL_TABLET | Freq: Every day | ORAL | Status: DC
  Administered 2015-10-16 – 2015-10-17 (×2): 5 mg via ORAL
  Filled 2015-10-16 (×2): qty 1

## 2015-10-16 MED ORDER — MIDAZOLAM HCL 5 MG/5ML IJ SOLN
INTRAMUSCULAR | Status: AC
  Filled 2015-10-16: qty 5

## 2015-10-16 MED ORDER — VITAMIN E 45 MG (100 UNIT) PO CAPS
200.0000 [IU] | ORAL_CAPSULE | Freq: Every day | ORAL | Status: DC
  Administered 2015-10-16 – 2015-10-17 (×2): 200 [IU] via ORAL
  Filled 2015-10-16 (×2): qty 2

## 2015-10-16 MED ORDER — LIDOCAINE HCL (PF) 1 % IJ SOLN
INTRAMUSCULAR | Status: DC | PRN
  Administered 2015-10-16: 50 mL

## 2015-10-16 MED ORDER — FUROSEMIDE 20 MG PO TABS
20.0000 mg | ORAL_TABLET | Freq: Every day | ORAL | Status: DC
  Administered 2015-10-16 – 2015-10-17 (×2): 20 mg via ORAL
  Filled 2015-10-16 (×2): qty 1

## 2015-10-16 MED ORDER — PANTOPRAZOLE SODIUM 40 MG PO TBEC
40.0000 mg | DELAYED_RELEASE_TABLET | Freq: Every day | ORAL | Status: DC
  Administered 2015-10-16 – 2015-10-17 (×2): 40 mg via ORAL
  Filled 2015-10-16 (×2): qty 1

## 2015-10-16 MED ORDER — ATORVASTATIN CALCIUM 20 MG PO TABS
20.0000 mg | ORAL_TABLET | Freq: Every day | ORAL | Status: DC
  Administered 2015-10-16: 20 mg via ORAL
  Filled 2015-10-16: qty 1

## 2015-10-16 MED ORDER — HYDROCODONE-ACETAMINOPHEN 5-325 MG PO TABS
1.0000 | ORAL_TABLET | Freq: Four times a day (QID) | ORAL | Status: DC | PRN
  Administered 2015-10-16 – 2015-10-17 (×3): 1 via ORAL
  Filled 2015-10-16 (×3): qty 1

## 2015-10-16 MED ORDER — LIDOCAINE HCL (PF) 1 % IJ SOLN
INTRAMUSCULAR | Status: AC
  Filled 2015-10-16: qty 60

## 2015-10-16 MED ORDER — CEFAZOLIN SODIUM-DEXTROSE 2-4 GM/100ML-% IV SOLN
2.0000 g | Freq: Two times a day (BID) | INTRAVENOUS | Status: DC
  Administered 2015-10-16: 2 g via INTRAVENOUS
  Filled 2015-10-16 (×2): qty 100

## 2015-10-16 MED ORDER — ALBUTEROL SULFATE (2.5 MG/3ML) 0.083% IN NEBU: 2.5000 mg | INHALATION_SOLUTION | Freq: Four times a day (QID) | RESPIRATORY_TRACT | Status: DC | PRN

## 2015-10-16 MED ORDER — LOPERAMIDE HCL 2 MG PO CAPS: 2.0000 mg | ORAL_CAPSULE | Freq: Four times a day (QID) | ORAL | Status: DC | PRN

## 2015-10-16 MED ORDER — SODIUM CHLORIDE 0.9 % IR SOLN
80.0000 mg | Status: AC
  Administered 2015-10-16: 80 mg
  Filled 2015-10-16: qty 2

## 2015-10-16 MED ORDER — SODIUM CHLORIDE 0.9 % IV SOLN
INTRAVENOUS | Status: DC
  Administered 2015-10-16: 06:00:00 via INTRAVENOUS

## 2015-10-16 MED ORDER — APIXABAN 2.5 MG PO TABS
2.5000 mg | ORAL_TABLET | Freq: Two times a day (BID) | ORAL | Status: DC
  Administered 2015-10-16 – 2015-10-17 (×3): 2.5 mg via ORAL
  Filled 2015-10-16 (×3): qty 1

## 2015-10-16 MED ORDER — FENTANYL CITRATE (PF) 100 MCG/2ML IJ SOLN
INTRAMUSCULAR | Status: AC
  Filled 2015-10-16: qty 2

## 2015-10-16 MED ORDER — CEFAZOLIN SODIUM-DEXTROSE 2-4 GM/100ML-% IV SOLN
INTRAVENOUS | Status: AC
  Filled 2015-10-16: qty 100

## 2015-10-16 MED ORDER — SODIUM CHLORIDE 0.9 % IR SOLN
Status: AC
  Filled 2015-10-16: qty 2

## 2015-10-16 MED ORDER — MUPIROCIN 2 % EX OINT
TOPICAL_OINTMENT | CUTANEOUS | Status: AC
  Filled 2015-10-16: qty 22

## 2015-10-16 MED ORDER — FENTANYL CITRATE (PF) 100 MCG/2ML IJ SOLN
INTRAMUSCULAR | Status: DC | PRN
  Administered 2015-10-16: 12.5 ug via INTRAVENOUS

## 2015-10-16 MED ORDER — FERROUS SULFATE 325 (65 FE) MG PO TABS
325.0000 mg | ORAL_TABLET | Freq: Every day | ORAL | Status: DC
  Administered 2015-10-17: 325 mg via ORAL
  Filled 2015-10-16: qty 1

## 2015-10-16 MED ORDER — ONDANSETRON HCL 4 MG/2ML IJ SOLN: 4.0000 mg | Freq: Four times a day (QID) | INTRAMUSCULAR | Status: DC | PRN

## 2015-10-16 MED ORDER — HEPARIN (PORCINE) IN NACL 2-0.9 UNIT/ML-% IJ SOLN
INTRAMUSCULAR | Status: DC | PRN
  Administered 2015-10-16: 08:00:00

## 2015-10-16 MED ORDER — CYANOCOBALAMIN 500 MCG PO TABS
250.0000 ug | ORAL_TABLET | Freq: Every day | ORAL | Status: DC
  Administered 2015-10-16 – 2015-10-17 (×2): 250 ug via ORAL
  Filled 2015-10-16 (×2): qty 1

## 2015-10-16 MED ORDER — SODIUM CHLORIDE 0.9 % IV SOLN
INTRAVENOUS | Status: DC | PRN
  Administered 2015-10-16: 50 mL/h via INTRAVENOUS

## 2015-10-16 MED ORDER — VITAMIN D 1000 UNITS PO TABS
1000.0000 [IU] | ORAL_TABLET | Freq: Every day | ORAL | Status: DC
Start: 2015-10-16 — End: 2015-10-17
  Administered 2015-10-16 – 2015-10-17 (×2): 1000 [IU] via ORAL
  Filled 2015-10-16 (×2): qty 1

## 2015-10-16 MED ORDER — ACETAMINOPHEN 325 MG PO TABS
325.0000 mg | ORAL_TABLET | ORAL | Status: DC | PRN
  Administered 2015-10-16: 650 mg via ORAL
  Filled 2015-10-16: qty 2

## 2015-10-16 MED ORDER — CEFAZOLIN SODIUM-DEXTROSE 2-4 GM/100ML-% IV SOLN
2.0000 g | INTRAVENOUS | Status: AC
  Administered 2015-10-16: 2 g via INTRAVENOUS
  Filled 2015-10-16: qty 100

## 2015-10-16 MED ORDER — MIDAZOLAM HCL 5 MG/5ML IJ SOLN
INTRAMUSCULAR | Status: DC | PRN
  Administered 2015-10-16: 0.5 mg via INTRAVENOUS

## 2015-10-16 MED ORDER — CALCIUM CARBONATE-VITAMIN D 500-200 MG-UNIT PO TABS
2.0000 | ORAL_TABLET | Freq: Every day | ORAL | Status: DC
  Administered 2015-10-16 – 2015-10-17 (×2): 2 via ORAL
  Filled 2015-10-16 (×2): qty 2

## 2015-10-16 MED ORDER — VITAMIN C 250 MG PO TABS
250.0000 mg | ORAL_TABLET | Freq: Every day | ORAL | Status: DC
  Administered 2015-10-16 – 2015-10-17 (×2): 250 mg via ORAL
  Filled 2015-10-16 (×2): qty 1

## 2015-10-16 SURGICAL SUPPLY — 6 items
CABLE SURGICAL S-101-97-12 (CABLE) ×2 IMPLANT
LEAD TENDRIL MRI 58CM LPA1200M (Lead) ×2 IMPLANT
PACEMAKER ASSURITY SR-SF (Pacemaker) ×2 IMPLANT
PAD DEFIB LIFELINK (PAD) ×2 IMPLANT
SHEATH CLASSIC 8F (SHEATH) ×2 IMPLANT
TRAY PACEMAKER INSERTION (PACKS) ×2 IMPLANT

## 2015-10-16 NOTE — Plan of Care (Signed)
Problem: Consults Goal: Permanent Pacemaker Patient Education (See Patient Education module for education specifics.) Outcome: Progressing Pt educated on permanent pacemaker, set to DD1R rate of 60. Pt currently in NSR, 1st degree block. Pt was complaining of 10/10 pain, 5/325 roxy given, pt asleep at this time. VSS. Will continue to monitor.

## 2015-10-16 NOTE — H&P (Signed)
Hannah Mercado presents to the hospital today with a history of atrial fibrillation.  She has been bradycardic down to the high 30s with symptoms of fatigue.  On exam, bradycardic, irregular, no murmurs, lungs clear. Presenting today for placement of a pacemaker.  Risks and benefits were discussed.  Risks include bleeding, tamponade, infection, pneumothorax.  Patient understands these risks and has agreed to the procedure.  Rya Rausch Curt Bears, MD 10/16/2015 7:05 AM

## 2015-10-17 ENCOUNTER — Ambulatory Visit (HOSPITAL_COMMUNITY): Payer: Medicare Other

## 2015-10-17 DIAGNOSIS — J449 Chronic obstructive pulmonary disease, unspecified: Secondary | ICD-10-CM | POA: Diagnosis not present

## 2015-10-17 DIAGNOSIS — I442 Atrioventricular block, complete: Secondary | ICD-10-CM | POA: Diagnosis not present

## 2015-10-17 DIAGNOSIS — K219 Gastro-esophageal reflux disease without esophagitis: Secondary | ICD-10-CM | POA: Diagnosis not present

## 2015-10-17 DIAGNOSIS — I517 Cardiomegaly: Secondary | ICD-10-CM | POA: Diagnosis not present

## 2015-10-17 DIAGNOSIS — I482 Chronic atrial fibrillation: Secondary | ICD-10-CM | POA: Diagnosis not present

## 2015-10-17 DIAGNOSIS — E785 Hyperlipidemia, unspecified: Secondary | ICD-10-CM | POA: Diagnosis not present

## 2015-10-17 DIAGNOSIS — M1990 Unspecified osteoarthritis, unspecified site: Secondary | ICD-10-CM | POA: Diagnosis not present

## 2015-10-17 DIAGNOSIS — Z882 Allergy status to sulfonamides status: Secondary | ICD-10-CM | POA: Diagnosis not present

## 2015-10-17 DIAGNOSIS — Z950 Presence of cardiac pacemaker: Secondary | ICD-10-CM | POA: Diagnosis not present

## 2015-10-17 DIAGNOSIS — I10 Essential (primary) hypertension: Secondary | ICD-10-CM | POA: Diagnosis not present

## 2015-10-17 MED FILL — Gentamicin Sulfate Inj 40 MG/ML: INTRAMUSCULAR | Qty: 2 | Status: AC

## 2015-10-17 MED FILL — Sodium Chloride Irrigation Soln 0.9%: Qty: 500 | Status: AC

## 2015-10-17 NOTE — Discharge Instructions (Addendum)
Supplemental Discharge Instructions for  Pacemaker/Defibrillator Patients  Activity No heavy lifting or vigorous activity with your left/right arm for 6 to 8 weeks.  Do not raise your left/right arm above your head for one week.  Gradually raise your affected arm as drawn below.           __        10/19/15                        10/20/15                     10/21/15                    10/22/15   WOUND CARE - Keep the wound area clean and dry.  Do not get this area wet for one week. No showers for one week; you may shower on  10/22/15   . - The tape/steri-strips on your wound will fall off; do not pull them off.  No bandage is needed on the site.  DO  NOT apply any creams, oils, or ointments to the wound area. - If you notice any drainage or discharge from the wound, any swelling or bruising at the site, or you develop a fever > 101? F after you are discharged home, call the office at once.  Special Instructions - You are still able to use cellular telephones; use the ear opposite the side where you have your pacemaker/defibrillator.  Avoid carrying your cellular phone near your device. - When traveling through airports, show security personnel your identification card to avoid being screened in the metal detectors.  Ask the security personnel to use the hand wand. - Avoid arc welding equipment, MRI testing (magnetic resonance imaging), TENS units (transcutaneous nerve stimulators).  Call the office for questions about other devices. - Avoid electrical appliances that are in poor condition or are not properly grounded. - Microwave ovens are safe to be near or to operate.   ---------------------------------------------------------------------------------------------------------------------------------------------------------  Information on my medicine - ELIQUIS (apixaban)  This medication education was reviewed with me or my healthcare representative as part of my discharge preparation.   The pharmacist that spoke with me during my hospital stay was:  Arty Baumgartner, [facility]  Why was Eliquis prescribed for you? Eliquis was prescribed for you to reduce the risk of a blood clot forming that can cause a stroke if you have a medical condition called atrial fibrillation (a type of irregular heartbeat).  What do You need to know about Eliquis ? Take your Eliquis TWICE DAILY - one tablet in the morning and one tablet in the evening with or without food. If you have difficulty swallowing the tablet whole please discuss with your pharmacist how to take the medication safely.  Take Eliquis exactly as prescribed by your doctor and DO NOT stop taking Eliquis without talking to the doctor who prescribed the medication.  Stopping may increase your risk of developing a stroke.  Refill your prescription before you run out.  After discharge, you should have regular check-up appointments with your healthcare provider that is prescribing your Eliquis.  In the future your dose may need to be changed if your kidney function or weight changes by a significant amount or as you get older.  What do you do if you miss a dose? If you miss a dose, take it as soon as you remember on the same day  and resume taking twice daily.  Do not take more than one dose of ELIQUIS at the same time to make up a missed dose.  Important Safety Information A possible side effect of Eliquis is bleeding. You should call your healthcare provider right away if you experience any of the following: ? Bleeding from an injury or your nose that does not stop. ? Unusual colored urine (red or dark brown) or unusual colored stools (red or black). ? Unusual bruising for unknown reasons. ? A serious fall or if you hit your head (even if there is no bleeding).  Some medicines may interact with Eliquis and might increase your risk of bleeding or clotting while on Eliquis. To help avoid this, consult your healthcare provider or  pharmacist prior to using any new prescription or non-prescription medications, including herbals, vitamins, non-steroidal anti-inflammatory drugs (NSAIDs) and supplements.  This website has more information on Eliquis (apixaban): http://[email or web address].com/eliquis/home

## 2015-10-17 NOTE — Discharge Summary (Signed)
ELECTROPHYSIOLOGY PROCEDURE DISCHARGE SUMMARY    Patient ID: Hannah Mercado,  MRN: [id number], DOB/AGE: 1922-04-08 82 y.o.  Admit date: 10/16/2015 Discharge date: 10/17/2015  Primary Care Physician: Noralee Space, MD Primary Cardiologist: Harrington Challenger Electrophysiologist: Curt Bears  Primary Discharge Diagnosis:  Symptomatic complete heart block status post pacemaker implantation this admission  Secondary Discharge Diagnosis:  1.  Permanent atrial fibrillation 2.  HTN 3.  Hyperlipidemia 4.  GERD 5.  DJD  Allergies  Allergen Reactions  . Sulfonamide Derivatives Nausea And Vomiting    Happened a long time ago, does not remember the other reaction she had.     Procedures This Admission:  1.  Implantation of a STJ single chamber PPM on 10/16/15 by Dr Curt Bears.  The patient received a STJ model number Assurity MRI compatible PPM with model number [id number] right ventricular lead. There were no immediate post procedure complications. 2.  CXR on 10/17/15 demonstrated no pneumothorax status post device implantation.   Brief HPI: Hannah Mercado is a 82 y.o. female was referred to electrophysiology in the outpatient setting for consideration of PPM implantation.  Past medical history includes complete heart block and permanent atrial fibrillation.  The patient has had symptomatic bradycardia without reversible causes identified.  Risks, benefits, and alternatives to PPM implantation were reviewed with the patient who wished to proceed.   Hospital Course:  The patient was admitted and underwent implantation of a STJ single chamber PPM with details as outlined above.  She  was monitored on telemetry overnight which demonstrated atrial fibrillation with ventricular pacing.  Left chest was without hematoma or ecchymosis.  The device was interrogated and found to be functioning normally.  CXR was obtained and demonstrated no pneumothorax status post device implantation.  Wound care, arm mobility, and  restrictions were reviewed with the patient.  The patient was examined and considered stable for discharge to home.   This patients CHA2DS2-VASc Score and unadjusted Ischemic Stroke Rate (% per year) is equal to 4.8 % stroke rate/year from a score of 4 Above score calculated as 1 point each if present [CHF, HTN, DM, Vascular=MI/PAD/Aortic Plaque, Age if 65-74, or Female] Above score calculated as 2 points each if present [Age > 75, or Stroke/TIA/TE]    Physical Exam: Vitals:   10/16/15 2351 10/17/15 0451 10/17/15 0500 10/17/15 0837  BP: 135/64 131/67    Pulse: 60 63    Resp: 14 14    Temp: 98.2 F (36.8 C) 98.2 F (36.8 C)  98.2 F (36.8 C)  TempSrc: Oral Oral  Oral  SpO2: 96% 97%    Weight:   121 lb 1.6 oz (54.9 kg)   Height:        GEN- The patient is elderly appearing, alert and oriented x 3 today.   HEENT: normocephalic, atraumatic; sclera clear, conjunctiva pink; hearing intact; oropharynx clear; neck supple  Lungs- Clear to ausculation bilaterally, normal work of breathing.  No wheezes, rales, rhonchi Heart- Regular rate and rhythm (paced) GI- soft, non-tender, non-distended, bowel sounds present  Extremities- no clubbing, cyanosis, or edema; DP/PT/radial pulses 2+ bilaterally MS- no significant deformity or atrophy Skin- warm and dry, no rash or lesion, left chest without hematoma/ecchymosis Psych- euthymic mood, full affect Neuro- strength and sensation are intact   Labs:   Lab Results  Component Value Date   WBC 5.9 10/09/2015   HGB 13.2 10/09/2015   HCT 39.1 10/09/2015   MCV 96.8 10/09/2015   PLT 196 10/09/2015   No results  for input(s): NA, K, CL, CO2, BUN, CREATININE, CALCIUM, PROT, BILITOT, ALKPHOS, ALT, AST, GLUCOSE in the last 168 hours.  Invalid input(s): LABALBU  Discharge Medications:    Medication List    TAKE these medications   albuterol 108 (90 Base) MCG/ACT inhaler Commonly known as:  PROVENTIL HFA;VENTOLIN HFA Inhale 2 puffs into the  lungs every 6 (six) hours as needed. Wheezing   alendronate 70 MG tablet Commonly known as:  FOSAMAX TAKE 1 TABLET BY MOUTH ONCE A WEEK WITH A FULL GLASS OF WATER ON AN EMPTY STOMACH   amLODipine 5 MG tablet Commonly known as:  NORVASC Take 1 tablet (5 mg total) by mouth daily.   apixaban 2.5 MG Tabs tablet Commonly known as:  ELIQUIS Take 1 tablet (2.5 mg total) by mouth 2 (two) times daily.   ascorbic acid 250 MG Chew Commonly known as:  VITAMIN C Chew 250 mg by mouth daily.   atorvastatin 20 MG tablet Commonly known as:  LIPITOR TAKE 1 TABLET (20 MG TOTAL) BY MOUTH AT BEDTIME.   calcium-vitamin D 500-200 MG-UNIT tablet Take 2 tablets by mouth daily.   ferrous sulfate 325 (65 FE) MG tablet Take 325 mg by mouth daily with breakfast.   furosemide 20 MG tablet Commonly known as:  LASIX Take 1 tablet (20 mg total) by mouth daily.   LOPERAMIDE A-D 2 MG tablet Generic drug:  loperamide Take 2 mg by mouth every 6 (six) hours as needed.   omeprazole 20 MG capsule Commonly known as:  PRILOSEC Take 20 mg by mouth daily.   tacrolimus 0.1 % ointment Commonly known as:  PROTOPIC Take as directed   vitamin B-12 250 MCG tablet Commonly known as:  CYANOCOBALAMIN Take 250 mcg by mouth daily.   Vitamin D3 1000 units Caps Take 1 capsule by mouth daily.   vitamin E 200 UNIT capsule Generic drug:  vitamin E Take 200 Units by mouth daily.       Disposition:  Discharge Instructions    Diet - low sodium heart healthy    Complete by:  As directed   Increase activity slowly    Complete by:  As directed     Follow-up Information    Will Meredith Leeds, MD Follow up on 10/27/2015.   Specialty:  Cardiology Why:  at 10:30AM for wound check  Contact information: [place] [address] [phone]           Duration of Discharge Encounter: Greater than 30 minutes including physician time.  Signed, Chanetta Marshall, NP 10/17/2015 9:21 AM  I have  seen and examined this patient with Chanetta Marshall.  Agree with above, note added to reflect my findings.  On exam, regular rhythm, no murmurs, lungs clear.  Had single chamber pacemaker for heart block and atrial fibrillation.  CXR and interrogation this AM without issues.  Plan for discharge today to follow up in device clinic in 10 days.    Will M. Camnitz MD 10/17/2015 9:27 AM

## 2015-10-17 NOTE — Plan of Care (Signed)
Problem: Phase III Progression Outcomes Goal: Pain controlled on oral analgesia Outcome: Progressing Pt states pain is relived with PO Norco.  Goal: Limited arm movement per orders Outcome: Progressing Arm sling aligned and in place.

## 2015-10-22 ENCOUNTER — Telehealth: Payer: Self-pay | Admitting: Cardiology

## 2015-10-22 NOTE — Telephone Encounter (Signed)
Spoke w/ pt and attempted to help her trouble shoot her home monitor. Called [employer] rep and discussed the issue that the pt was experiencing. She said to have pt bring home monitor into the office and someone w/ [facility] [employer] will come and set up the monitor. Pt agreed to this plan and verbalized understanding that her PPM is working and that her monitor is just another set of eyes.

## 2015-10-27 ENCOUNTER — Encounter: Payer: Self-pay | Admitting: Cardiology

## 2015-10-27 ENCOUNTER — Ambulatory Visit (INDEPENDENT_AMBULATORY_CARE_PROVIDER_SITE_OTHER): Payer: Medicare Other | Admitting: *Deleted

## 2015-10-27 DIAGNOSIS — I481 Persistent atrial fibrillation: Secondary | ICD-10-CM | POA: Diagnosis not present

## 2015-10-27 DIAGNOSIS — I4819 Other persistent atrial fibrillation: Secondary | ICD-10-CM

## 2015-10-27 LAB — CUP PACEART INCLINIC DEVICE CHECK
Battery Remaining Longevity: 91.2
Brady Statistic RV Percent Paced: 98 %
Implantable Lead Location: 753860
Lead Channel Impedance Value: 562.5 Ohm
Lead Channel Pacing Threshold Pulse Width: 0.4 ms
Lead Channel Sensing Intrinsic Amplitude: 12 mV
Lead Channel Setting Pacing Amplitude: 3.5 V
MDC IDC LEAD IMPLANT DT: 20170727
MDC IDC MSMT BATTERY VOLTAGE: 3.05 V
MDC IDC MSMT LEADCHNL RV PACING THRESHOLD AMPLITUDE: 0.5 V
MDC IDC MSMT LEADCHNL RV PACING THRESHOLD AMPLITUDE: 0.5 V
MDC IDC MSMT LEADCHNL RV PACING THRESHOLD PULSEWIDTH: 0.4 ms
MDC IDC PG SERIAL: 7897007
MDC IDC SESS DTM: 20170807140411
MDC IDC SET LEADCHNL RV PACING PULSEWIDTH: 0.4 ms
MDC IDC SET LEADCHNL RV SENSING SENSITIVITY: 2 mV
Pulse Gen Model: 1272

## 2015-10-27 NOTE — Progress Notes (Signed)
Wound check appointment. Steri-strips removed. Wound without redness or edema. Incision edges approximated, wound well healed. Normal device function. Threshold, sensing, and impedance consistent with implant measurements. Device programmed at 3.5V for extra safety margin until 3 month visit. Histogram distribution appropriate for patient and level of activity. No high ventricular rates noted. Patient educated about wound care, arm mobility, lifting restrictions. ROV in 3 months with WC. 

## 2016-01-19 DIAGNOSIS — Z23 Encounter for immunization: Secondary | ICD-10-CM | POA: Diagnosis not present

## 2016-01-26 ENCOUNTER — Encounter: Payer: Medicare Other | Admitting: Cardiology

## 2016-01-28 NOTE — Progress Notes (Signed)
Electrophysiology Office Note   Date:  01/29/2016   ID:  Hannah Mercado, DOB 07-04-1922, MRN [id number]  PCP:  Noralee Space, MD  Cardiologist:  Harrington Challenger Primary Electrophysiologist:  Sharrod Achille Meredith Leeds, MD    Chief Complaint  Patient presents with  . Pacemaker Check    CHB     History of Present Illness: Hannah Mercado is a 82 y.o. female who presents today for electrophysiology evaluation.   History of AF/flutter, HLD.  Was seen in cardiology clinic 09/12/15 with AF and a HR of 39.  Pacemaker placed 10/16/15. Since that time she has felt well without complaint.  She continues to be able to do all of her daily activities.  She is excited about the holidays and is planning on traveling to [place] for Christmas.   Today, she denies symptoms of palpitations, chest pain, shortness of breath, orthopnea, PND, lower extremity edema, claudication, dizziness, presyncope, syncope, bleeding, or neurologic sequela. The patient is tolerating medications without difficulties and is otherwise without complaint today.    Past Medical History:  Diagnosis Date  . Allergy history unknown   . Anxiety   . Asthmatic bronchitis    use inhaler prn   . Blood transfusion    ? with ectopic pregnancy 50 yrs ago   . DJD (degenerative joint disease)   . GERD (gastroesophageal reflux disease)   . Hiatal hernia   . History of pneumonia    right middle lobe  . Hypercholesteremia   . Mitral valve prolapse    pt denies   . Osteopenia   . Pulmonary fibrosis, postinflammatory ([place])    pt denies   . Rectal cancer [facility])    Past Surgical History:  Procedure Laterality Date  . APPENDECTOMY    . COLON RESECTION  02/08/2011   Procedure: LAPAROSCOPIC SIGMOID COLON RESECTION;  Surgeon: Stark Klein, MD;  Location: [facility];  Service: General;  Laterality: N/A;  Laparoscopic Lower Anterior Bowel Resection  . COLON SURGERY    . COLONOSCOPY  11/27/10   rectal cancer, small sigmoid adenoma, diverticulosis  . ECTOPIC  PREGNANCY SURGERY     Midline incision  . EP IMPLANTABLE DEVICE N/A 10/16/2015   Procedure: Pacemaker Implant;  Surgeon: Konnor Vondrasek Meredith Leeds, MD;  Location: [facility];  Service: Cardiovascular;  Laterality: N/A;  . HAMMER TOE SURGERY  1995   Dr. Silverio Decamp  . Laparoscopic Left oophorectomy, laparoscopic low anterior resection, placement of OnQ pain pump  02/08/2011  . NASAL SINUS SURGERY  1994  . OVARIAN CYST REMOVAL  02/08/2011   Procedure: OVARIAN CYSTECTOMY;  Surgeon: Stark Klein, MD;  Location: [facility];  Service: General;  Laterality: Left;  Removal of Left Ovary and Pelvic Mass  . VAGINAL HYSTERECTOMY  1980's     Current Outpatient Prescriptions  Medication Sig Dispense Refill  . albuterol (PROVENTIL HFA;VENTOLIN HFA) 108 (90 BASE) MCG/ACT inhaler Inhale 2 puffs into the lungs every 6 (six) hours as needed. Wheezing     . alendronate (FOSAMAX) 70 MG tablet TAKE 1 TABLET BY MOUTH ONCE A WEEK WITH A FULL GLASS OF WATER ON AN EMPTY STOMACH 4 tablet 5  . amLODipine (NORVASC) 5 MG tablet Take 1 tablet (5 mg total) by mouth daily. 30 tablet 6  . apixaban (ELIQUIS) 2.5 MG TABS tablet Take 1 tablet (2.5 mg total) by mouth 2 (two) times daily. 60 tablet 6  . ascorbic acid (VITAMIN C) 250 MG CHEW Chew 250 mg by mouth daily.     Marland Kitchen  atorvastatin (LIPITOR) 20 MG tablet TAKE 1 TABLET (20 MG TOTAL) BY MOUTH AT BEDTIME. 90 tablet 2  . Calcium Carbonate-Vitamin D (CALCIUM-VITAMIN D) 500-200 MG-UNIT per tablet Take 2 tablets by mouth daily.     . Cholecalciferol (VITAMIN D3) 1000 UNITS CAPS Take 1 capsule by mouth daily.     . ferrous sulfate 325 (65 FE) MG tablet Take 325 mg by mouth daily with breakfast.    . furosemide (LASIX) 20 MG tablet Take 1 tablet (20 mg total) by mouth daily. 30 tablet 11  . loperamide (LOPERAMIDE A-D) 2 MG tablet Take 2 mg by mouth every 6 (six) hours as needed.    Marland Kitchen omeprazole (PRILOSEC) 20 MG capsule Take 20 mg by mouth daily.     . vitamin B-12  (CYANOCOBALAMIN) 250 MCG tablet Take 250 mcg by mouth daily.     . vitamin E (VITAMIN E) 200 UNIT capsule Take 200 Units by mouth daily.      No current facility-administered medications for this visit.    Facility-Administered Medications Ordered in Other Visits  Medication Dose Route Frequency Provider Last Rate Last Dose  . ondansetron (ZOFRAN) injection    PRN Ofilia Neas, CRNA   1 mg at 02/08/11 0915    Allergies:   Sulfonamide derivatives   Social History:  The patient  reports that she has never smoked. She has never used smokeless tobacco. She reports that she does not drink alcohol or use drugs.   Family History:  The patient's family history includes Heart disease in her father; Lung cancer in her brother.    ROS:  Please see the history of present illness.   Otherwise, review of systems is positive for none.   All other systems are reviewed and negative.    PHYSICAL EXAM: VS:  BP 124/72   Pulse 60   Ht 5\' 7"  (1.702 m)   Wt 121 lb 6.4 oz (55.1 kg)   BMI 19.01 kg/m  , BMI Body mass index is 19.01 kg/m. GEN: Well nourished, well developed, in no acute distress  HEENT: normal  Neck: no JVD, carotid bruits, or masses Cardiac: RRR; no murmurs, rubs, or gallops,no edema  Respiratory:  clear to auscultation bilaterally, normal work of breathing GI: soft, nontender, nondistended, + BS MS: no deformity or atrophy  Skin: warm and dry Neuro:  Strength and sensation are intact Psych: euthymic mood, full affect  EKG:  EKG is not ordered today. Personal review of the ECG ordered shows sinus rhythm, V paced, rate 60   Recent Labs: 09/11/2015: ALT 15; Pro B Natriuretic peptide (BNP) 857.0; TSH 4.80 10/09/2015: BUN 21; Creat 1.08; Hemoglobin 13.2; Platelets 196; Potassium 4.1; Sodium 140    Lipid Panel     Component Value Date/Time   CHOL 148 09/11/2015 1023   TRIG 75.0 09/11/2015 1023   HDL 71.90 09/11/2015 1023   CHOLHDL 2 09/11/2015 1023   VLDL 15.0 09/11/2015 1023     LDLCALC 61 09/11/2015 1023     Wt Readings from Last 3 Encounters:  01/29/16 121 lb 6.4 oz (55.1 kg)  10/17/15 121 lb 1.6 oz (54.9 kg)  09/29/15 123 lb 12.8 oz (56.2 kg)      Other studies Reviewed: Additional studies/ records that were reviewed today include: TTE 09/16/15  Review of the above records today demonstrates:  - Left ventricle: The cavity size was normal. There was mild  concentric hypertrophy. Systolic function was normal. The  estimated ejection fraction was in the  range of 55% to 60%. Wall  motion was normal; there were no regional wall motion  abnormalities. The study is not technically sufficient to allow  evaluation of LV diastolic function. - Aortic valve: Trileaflet; mildly thickened, mildly calcified  leaflets. There was mild regurgitation. - Mitral valve: Calcified annulus. Moderate prolapse, involving the  anterior leaflet. There was moderate regurgitation directed  posteriorly. - Left atrium: The atrium was severely dilated. - Right atrium: The atrium was severely dilated. - Tricuspid valve: There was moderate regurgitation. - Pulmonary arteries: Systolic pressure was severely increased. PA  peak pressure: 80 mm Hg (S).   ASSESSMENT AND PLAN:  1.  Atrial fibrillation: is on anticoagulation with Eliquis but no rate control.  No evidence of further atrial fibrillation on device check.  This patients CHA2DS2-VASc Score and unadjusted Ischemic Stroke Rate (% per year) is equal to 4.8 % stroke rate/year from a score of 4  Above score calculated as 1 point each if present [CHF, HTN, DM, Vascular=MI/PAD/Aortic Plaque, Age if 65-74, or Female] Above score calculated as 2 points each if present [Age > 75, or Stroke/TIA/TE]  2. Complete heart block: pacemaker placed 10/16/15. No issues with pacemaker.  Continue current management.  3. Hypertension: Well controlled today.   Current medicines are reviewed at length with the patient today.   The  patient does not have concerns regarding her medicines.  The following changes were made today:  none  Labs/ tests ordered today include:  No orders of the defined types were placed in this encounter.    Disposition:   FU with Elon Lomeli 9 months  Signed, Robbie Nangle Meredith Leeds, MD  01/29/2016 11:12 AM     [facility] [address] [place] [address] [phone] (office) [phone] (fax)

## 2016-01-29 ENCOUNTER — Ambulatory Visit (INDEPENDENT_AMBULATORY_CARE_PROVIDER_SITE_OTHER): Payer: Medicare Other | Admitting: Cardiology

## 2016-01-29 ENCOUNTER — Encounter: Payer: Self-pay | Admitting: Cardiology

## 2016-01-29 VITALS — BP 124/72 | HR 60 | Ht 67.0 in | Wt 121.4 lb

## 2016-01-29 DIAGNOSIS — I481 Persistent atrial fibrillation: Secondary | ICD-10-CM | POA: Diagnosis not present

## 2016-01-29 DIAGNOSIS — I4819 Other persistent atrial fibrillation: Secondary | ICD-10-CM

## 2016-01-29 DIAGNOSIS — I6523 Occlusion and stenosis of bilateral carotid arteries: Secondary | ICD-10-CM

## 2016-01-29 NOTE — Patient Instructions (Signed)
Medication Instructions:    Your physician recommends that you continue on your current medications as directed. Please refer to the Current Medication list given to you today.  --- If you need a refill on your cardiac medications before your next appointment, please call your pharmacy. ---  Labwork:  None ordered  Testing/Procedures:  None ordered  Follow-Up: Remote monitoring is used to monitor your Pacemaker of ICD from home. This monitoring reduces the number of office visits required to check your device to one time per year. It allows [place] to keep an eye on the functioning of your device to ensure it is working properly. You are scheduled for a device check from home on 04/29/2016. You may send your transmission at any time that day. If you have a wireless device, the transmission will be sent automatically. After your physician reviews your transmission, you will receive a postcard with your next transmission date.   Your physician wants you to follow-up in: 9 months with Dr. Curt Bears.  You will receive a reminder letter in the mail two months in advance. If you don't receive a letter, please call our office to schedule the follow-up appointment.  Thank you for choosing [facility]!!   Trinidad Curet, RN [phone]

## 2016-01-30 LAB — CUP PACEART INCLINIC DEVICE CHECK
Battery Remaining Longevity: 120 mo
Battery Voltage: 3.01 V
Date Time Interrogation Session: 20171109165139
Implantable Lead Location: 753860
Implantable Pulse Generator Implant Date: 20170727
Lead Channel Setting Pacing Amplitude: 2.5 V
Lead Channel Setting Pacing Pulse Width: 0.4 ms
Lead Channel Setting Sensing Sensitivity: 2 mV
MDC IDC LEAD IMPLANT DT: 20170727
MDC IDC MSMT LEADCHNL RV IMPEDANCE VALUE: 562.5 Ohm
MDC IDC MSMT LEADCHNL RV PACING THRESHOLD AMPLITUDE: 0.5 V
MDC IDC MSMT LEADCHNL RV PACING THRESHOLD PULSEWIDTH: 0.4 ms
MDC IDC MSMT LEADCHNL RV SENSING INTR AMPL: 12 mV
MDC IDC PG SERIAL: 7897007
MDC IDC STAT BRADY RV PERCENT PACED: 98 %
Pulse Gen Model: 1272

## 2016-02-24 ENCOUNTER — Ambulatory Visit (INDEPENDENT_AMBULATORY_CARE_PROVIDER_SITE_OTHER): Payer: Medicare Other | Admitting: Pulmonary Disease

## 2016-02-24 VITALS — BP 124/70 | HR 66 | Temp 96.8°F | Ht 67.0 in | Wt 121.5 lb

## 2016-02-24 DIAGNOSIS — I6523 Occlusion and stenosis of bilateral carotid arteries: Secondary | ICD-10-CM

## 2016-02-24 DIAGNOSIS — I10 Essential (primary) hypertension: Secondary | ICD-10-CM | POA: Diagnosis not present

## 2016-02-24 DIAGNOSIS — M150 Primary generalized (osteo)arthritis: Secondary | ICD-10-CM | POA: Diagnosis not present

## 2016-02-24 DIAGNOSIS — M899 Disorder of bone, unspecified: Secondary | ICD-10-CM

## 2016-02-24 DIAGNOSIS — M949 Disorder of cartilage, unspecified: Secondary | ICD-10-CM | POA: Diagnosis not present

## 2016-02-24 DIAGNOSIS — C20 Malignant neoplasm of rectum: Secondary | ICD-10-CM

## 2016-02-24 DIAGNOSIS — I059 Rheumatic mitral valve disease, unspecified: Secondary | ICD-10-CM

## 2016-02-24 DIAGNOSIS — I739 Peripheral vascular disease, unspecified: Secondary | ICD-10-CM

## 2016-02-24 DIAGNOSIS — I4819 Other persistent atrial fibrillation: Secondary | ICD-10-CM

## 2016-02-24 DIAGNOSIS — E7800 Pure hypercholesterolemia, unspecified: Secondary | ICD-10-CM | POA: Diagnosis not present

## 2016-02-24 DIAGNOSIS — I481 Persistent atrial fibrillation: Secondary | ICD-10-CM

## 2016-02-24 DIAGNOSIS — I779 Disorder of arteries and arterioles, unspecified: Secondary | ICD-10-CM

## 2016-02-24 DIAGNOSIS — J8410 Pulmonary fibrosis, unspecified: Secondary | ICD-10-CM | POA: Diagnosis not present

## 2016-02-24 DIAGNOSIS — Z950 Presence of cardiac pacemaker: Secondary | ICD-10-CM

## 2016-02-24 DIAGNOSIS — M159 Polyosteoarthritis, unspecified: Secondary | ICD-10-CM

## 2016-02-24 HISTORY — DX: Presence of cardiac pacemaker: Z95.0

## 2016-02-24 MED ORDER — ALENDRONATE SODIUM 70 MG PO TABS: ORAL_TABLET | ORAL | 3 refills | Status: DC

## 2016-02-24 NOTE — Patient Instructions (Signed)
Today we updated your med list in our EPIC system...    Continue your current medications the same...  We refilled your meds per request...  Have a wonderful Christmas in [facility]...  Call for any questions...  Let's plan a follow up visit in 50mo, sooner if needed for problems.Marland KitchenMarland Kitchen

## 2016-02-25 ENCOUNTER — Encounter: Payer: Self-pay | Admitting: Pulmonary Disease

## 2016-02-25 NOTE — Progress Notes (Signed)
Subjective:    Patient ID: Hannah Mercado, female    DOB: Aug 20, 1922, 82 y.o.   MRN: [id number]  HPI 82 y/o WF here for a follow up visit... ~  SEE PREV EPIC NOTES FOR OLDER DATA >>    2DEcho 2/04 showing MVP, mild MR, normal LVF... StressEcho w/ EF=60%... Cardiolite 12/03 was normal w/o ischemia and EF=65%.  Baseline EKG 02/08/11>  NSR, rate60, 1st degree AVB w/ PR=.22  CXR 11/13 showed borderline cardiomeg, ectasia of the Ao, chronic lung changes/ NAD, DJD/ spondylosis...  LABS 11/13:  Chems- wnl;  CBC- wnl;  UA- ok...   LABS 5/14:  FLP- at goals on Lip20;  Chems- wnl;  CBC- wnl;  TSH=2.61;  VitD=56;  UA- few wbc, no bact...  CXR => done 6/15>  Borderline cardiomeg, prominent PAs, calcif Ao arch, COPD w/ sl incr markings/ NAD, DJD in Tspine...  LABS 5/15:  FLP- within parameters on Lip20;  Chems- wnl;  CBC- ok w/ Hg=12.6;  TSH=2.41;  VitD=55 on 1000u daily...  BMD => done 6/15>  Lowest Tscore is -2.6 in Lumbar spine; she is rec to continue ALENDRONATE 70mg /wk & calcium/ MVI/ VitD supplements...  ~  August 28, 2014:  32mo ROV & Aden will soon be 22!  Planning a trip to [place] to visit daugh in Aug;  She does crosswords everyday!  She reports doing satis, no new complaints or concerns- taking nutritional supplements & wt is up a few lbs...    Hx AB, pulm fibrosis> on ProventilHFA prn (rarely uses); no resp symptoms & breathing good, & she denies cough, phlegm, SOB, etc; CXR w/o acute changes...    MVP, HBP> on ASA81 & Amlod2.5; BP= 136/80 & similar at home; stable w/ msc & gr1/6 SEM; no CP, palpit, ch in SOB, edema, etc...    Right carotid bruit> no cerebral ischemic symptoms; CDoppler 6/16 showed 40-59% RICA stensis, Rx ASA...    Chol> remains on Lip20 & FLP 6/16 looks great w/ TChol 187, TG 83, HDL 93, LDL 78    HH, GERD> on Prilosec20mg /d + antireflux regimen in view of her HH etc; she denies abd pain, n/v, notes intermit diarrhea, no blood seen...    Hx rectal cancer> s/p lap  low anterior resection 11/12 by DrByerly; f/u colonoscopy 11/13 by DrGessner- mod divertics, anastomosis ok; she sees DrByerly Q6-27mo- notes reviewed still w/ occas loose stool (uses Immodium prn); on Align & Activia; she's avoiding lactose & gas is improved, improved after nutrition consult;  CEA level 2/15 = 3.2 (stable); CEA 2/16 by DrByerly & we don't have report.    DJD, Osteopenia> on Fosamax70/wk along w/ Ca, MVI, VitD; last BMD 6/15 w/ lowest Tscore-2.6 (continue same rx), she is getting along well but hasn't played much golf since her surg...    Other problems as noted... She likes to take a number of supplements... We reviewed prob list, meds, xrays and labs> see below for updates >>   CXR 6/16 showed borderline heart size, hyperinflated lungs, icr markings bilat, NAD/ no change...  LABS 6/16:  FLP- at goals on Atorva20;  Chems- wnl;  CBC- wnl;  TSH=3.86;  UA- clear... NOTE> CEA done 2/16 by DrByerly & we do not have results.  CDopplers 6/16 showed irreg mixed plaque bilat, 40-59% RICAstenosis & 8-29% LICAstenosis, norm subclav & antegrade vertebrals..The current medical regimen is effective;  continue present plan and medications. ASA daily.  ~  March 12, 2015:  42mo ROV & Kahla turned 49 this  past summer- surprise party by her 2 children w/ 40 people & she had a blast; she is going to family on [place] for 43mo over the holidays; clinically doing very well- feels good, still does crosswords daily, no new complaints or concerns... She is reminded to drink 1-2 can Ensure/ Sustacal/ Boost daily to help her gain wt...     She has a ProairHFA inhaler but hasn't needed it in yrs she says; breathing stable w/ her chr lung dis- Hx AB & pulm fibrosis, she denies much cough/ sput/ CP/ SOB...     BP & MVP remain stable on low dose Amlodipine 2.5mg /d; BP= 128/80 & she remains relatively asymptomatic; we reviewed exercise program...     Chol is treated w/ Lip20 & last FLP 6/16 looked good w/ TChol 187,  TG 83, HDL 93, LDL 78; she has lost wt down to 123# w/ BMI=20 5 encouraged to eat more + supplement...    Hx adenoca of rectum, s/p surg 11/12- followed by DrByerly & DrGessner, stable & no known recurrence...    Hx DJD, osteopenia on Fosamax, VitD, MVI, & OTC analgesics as needed...    Nocturnal leg cramps> she thinks neuropathy but tonic water & prev Quinine rx helped...  We reviewed prob list, meds, xrays and labs>  IMP/PLAN>>  [name] is doing remarkably well at age 41;  We discussed continuing the same meds, and incr nutrtitional supplements;  We will plan ROV w/ CXR & Labs in 37mo...  ~  September 11, 2015:  80mo ROV & pulmonary/ medical follow up visit>  Zynasia reports a good 81mo interval but notes LE edema x 3-4wks;  She has gained 9# from her baseline 109mo ago (123#) to 132# today assoc w/ 2+pitting edema in both legs which is new; she denies dietary sodium excess or prev hx of edema=> see below as we review the following medical problems during today's office visit >>     Hx AB, pulm fibrosis> on ProventilHFA prn (rarely uses); no resp symptoms & breathing good, & she denies cough, phlegm, SOB, etc; CXR today shows cardiomeg & interstitial edema...    MVP, HBP> on ASA81 & Amlod2.5; BP= 138/70 but pulse is slow today w/ HR ~40 & she denies CP, palpit, dizzy/ syncope/ near-syncope, change in DOE etc...    Cardiac arrhythmia> new finding on today's EKG=> ?Flutter w/ slow VR, rate40; she has the new edema, interstitial changes on CXR & BNP=857 => referred to [facility] ASAP for eval...    Right carotid bruit> no cerebral ischemic symptoms; CDoppler 6/17 shows no change in 40-59% RICA stenosis, Rx ASA...    Chol> remains on Lip20 & FLP 6/17 looks great w/ TChol 148, TG 75, HDL 72, LDL 61    HH, GERD> on Prilosec20mg /d + antireflux regimen in view of her HH etc; she denies abd pain, n/v, notes intermit diarrhea, no blood seen...    Hx rectal cancer> s/p lap low anterior resection 11/12 by DrByerly; f/u  colonoscopy 11/13 by DrGessner- mod divertics, anastomosis ok; she sees DrByerly Q69mo- notes reviewed still w/ occas loose stool (uses Immodium prn); on Align & Activia; she's avoiding lactose & gas is improved, improved after nutrition consult;  CEA level 2/15 = 3.2 (stable); CEA 2/16 & 2/17 by DrByerly & we don't have report.    DJD, Osteopenia> on Fosamax70/wk along w/ Ca, MVI, VitD; last BMD 6/15 w/ lowest Tscore-2.6 (continue same rx), she is getting along well but hasn't played much golf since  her surg...    Other problems as noted... She likes to take a number of supplements... EXAM shows Afeb, VSS x pulse=40; O2sat=96% on RA; Wt=132#, BMI=15;  HEENT- neg, mallampati1, R carotid bruit;  Chest- decr BS w/ few scat rhonchi, bibasilar rales- unchanged;  Heart- brady rate ~40, gr 1-2/6 SEM w/o r/g;  Abd- thin soft, neg;  Ext- VI, 2+edema in LEs w/o c/c;  Neuro- intact...  CDoppler 09/04/15>  Heterogeneous plaque bilat, stable 40-595 RICA stenosis & 1-50% LICA stenosis, subclav & vertebrals ok...  CXR 09/11/15>  Mod cardiomegaly, incr interstitial markings suggesting poss edema, increased markings at bases...  EKG 09/11/15>  Bradycardia, rate ~40, ?AFlutter, poor R progression V1-2...  LABS 09/11/15>  FLP- all parameters at goals on Lip20;  Chems- wnl w/ Cr=0.94, BS=112, BNP=857;  CBC- wnl;  TSH=4.80;  VitD=60;  UA- clear...  IMP/PLAN>>  While still essentially asymptomatic at age 76, Rissie has developed 2+pitting edema in LEs over the last 3weeks or so & has a bradycardia on EKG w/ ?Flutter & slow VR, only on Amlod2.5, BNP is elev at 857=> we will start Lasix20 & refer for Cards eval ASAP.     ~  February 24, 2016:  6 month [place] had Pacer placed in July- [place] for [facility]> most recent f/u 01/2016-- hx AFib/Flutter, low HR from CHB=> pacer placed 10/16/15, doing satis, planning trip to [place] for [place]... On Eliquis, no further AFib on device check... We reviewed the following medical  problems during today's office visit >>     Hx AB, pulm fibrosis> on ProventilHFA prn (rarely uses); no resp symptoms & breathing good, & she denies cough, phlegm, SOB, etc; CXR today shows cardiomeg & interstitial edema...    MVP, HBP> on Amlod2.5 & Lasix20; BP= 124/70 but pulse is slow today w/ HR ~40 & she denies CP, palpit, dizzy/ syncope/ near-syncope, change in DOE etc...    Cardiac arrhythmia=> Fib/Flutter & CHB=>Pacer> new finding 08/2015=> ?Flutter w/ slow VR, rate40; she has the new edema, interstitial changes on CXR & BNP=857 => referred to [facility]    Right carotid bruit> no cerebral ischemic symptoms; CDoppler 6/17 shows no change in 40-59% RICA stenosis, Rx ASA...    Chol> remains on Lip20 & FLP 6/17 looks great w/ TChol 148, TG 75, HDL 72, LDL 61    HH, GERD> on Prilosec20mg /d + antireflux regimen in view of her HH etc; she denies abd pain, n/v, notes intermit diarrhea, no blood seen...    Hx rectal cancer> s/p lap low anterior resection 11/12 by DrByerly; f/u colonoscopy 11/13 by DrGessner- mod divertics, anastomosis ok; she sees DrByerly Q26mo- notes reviewed still w/ occas loose stool (uses Immodium prn); on Align & Activia; she's avoiding lactose & gas is improved, improved after nutrition consult;  CEA level 2/15 = 3.2 (stable); CEA 2/16 & 2/17 by DrByerly & we don't have report.    DJD, Osteopenia> on Fosamax70/wk along w/ Ca, MVI, VitD; last BMD 6/15 w/ lowest Tscore-2.6 (continue same rx), she is getting along well but hasn't played much golf since her surg...    Other problems as noted... She likes to take a number of supplements... EXAM shows Afeb, VSS; O2sat=97% on RA; Wt=122#, BMI=15;  HEENT- neg, mallampati1, R carotid bruit;  Chest- decr BS w/ few scat rhonchi, bibasilar rales- unchanged;  Heart- RR rate70, gr 1-2/6 SEM w/o r/g;  Abd- thin soft, neg;  Ext- VI, +tr edema in LEs w/o c/c;  Neuro- intact... IMP/PLAN>>  Serinity is stable 7 looking forward to Xmas in [place] w/  family; continue same meds and follow up w/ [place] & [facility]...           Problem List:   pt's cell # [phone]  ALLERGY (ICD-995.3) - uses OTC meds as needed.  ASTHMATIC BRONCHITIS, ACUTE (ICD-466.0) - uses PROAIR as needed, but doing well, breathing OK. ~  7/09:  AB exac Rx w/ Pred, Avelox, Mucinex, etc... symptoms resolved. ~  CXR: baseline film w/ mild cardiomeg, scarring, chr changes, HH seen, & NAD.Marland Kitchen. ~  f/u CXR 6/11 & 7/12>  no change... ~  CXR 11/13 showed borderline cardiomeg, ectasia of the Ao, chronic lung changes/ NAD, DJD/ spondylosis..  ~  CXR 6/15>  Borderline cardiomeg, prominent PAs, calcif Ao arch, COPD w/ sl incr markings/ NAD, DJD in Tspine ~  CXR 6/16 showed borderline heart size, hyperinflated lungs, icr markings bilat, NAD/ no change ~  CXR 6/17 showed mod cardiomegaly, incr interstitial markings suggesting poss edema, increased markings at bases...  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 exam showed bibasilar velcro rales at bases...  HYPERTENSION >> on NORVASC 5mg - 1/2 tab daily... ~  1/13: BP= 160/80 & we decided to start Norvasc5mg /d... subeq cut down to 1/2 tab per phone message. ~  3/13: BP= 154/84 & repeat 124/74; she feels well & denies CP, palpit, SOB, edema, etc; continue same... ~  5/13:  BP= 150/68 & she remains asymptomatic &in good spirits; ok to continue same for now, monitor BP at home. ~  11/13:  BP= 122/70 & she continues to gain strength slowly... ~  11/14: on Amlod5-1/2 daily; BP= 116.62 & she denies CP, palpit, SOB, edema... ~  5/15: on ASA81 & Amlod2.5; BP= 124/70 & similar at home; stable w/ msc & gr1/6 SEM; no CP, palpit, ch in SOB, edema, etc ~  12/15: on ASA81 & Amlod2.5; BP= 122/62 and stable, doing satis... ~  6/16: on ASA81 & Amlod2.5; BP= 136/80 & similar at home; stable w/ msc & gr1/6 SEM; no CP, palpit, ch in SOB, edema, etc ~  6/17: n ASA81 & Amlod2.5; BP=  138/70 but pulse is slow today w/ HR ~40 & she denies CP, palpit, dizzy/ syncope/ near-syncope, change in DOE etc...  MITRAL VALVE PROLAPSE >> Atrial Fib/ Flutter Complete heart block => PACER 09/2015 ~  had 2DEcho 2/04 showing MVP, mild MR, normal LVF... StressEcho w/ EF=60%... subseq Cardiolite 12/03 was normal w/o ischemia and EF=65%... denies CP, palpit, dizzy, syncope, edema, etc; she is gradually increasing activities post op... ~  EKG 11/12 showed NSR, rate60, sl incr voltage, NAD... ~  6/17> Cardiac arrhythmia> new finding on today's EKG=> ?Flutter w/ slow VR, rate40; she has the new edema, interstitial changes on CXR & BNP=857 => referred to [facility] ASAP for eval... ~  09/2015> Pacer placed by [facility] for CHB; subseq device checks w/o recurrent AFib, pt on Eliquis...  Right Carotid Bruit>  no cerebral ischemic symptoms; CDoppler 6/16 showed 40-59% RICA stenosis, Rx ASA. ~  no cerebral ischemic symptoms; CDoppler 6/17 shows no change in 40-59% RICA stenosis, Rx ASA...  HYPERCHOLESTEROLEMIA (ICD-272.0) - controlled on LIPITOR 20mg /d,  and tol well...  ~  labs 7/08 showed TChol 183, TG 111, HDL 65, LDL 96... Doing well, continue Lip20. ~  FLP 11/09 showed TChol 189, TG 70, HDL 71, LDL 104 ~  FLP  5/10 showed TChol 173, TG 53, HDL 75, LDL 87... Remains stable on Lip20. ~  FLP 6/11 showed TChol 183, TG 103, HDL 74, LDL 88 ~  FLP 7/12 showed TChol 182, TG 127, HDL 74, LDL 82... Continues stable on Lip20. ~  FLP 5/13 on Lip20 showed TChol 167, TG 79, HDL 82, LDL 69... Continue same. ~  FLP 5/14 on Lip20 showed TChol 174, TG 64, HDL 78, LDL 83 ~  FLP 5/15 on Lip20 showed TChol 184, TG 84, HDL 83, LDL 84 ~  FLP 6/16 on Lip20 showed TChol 187, TG 83, HDL 93, LDL 78 ~  FLP 6/17 on Lip20 showed TChol 148, TG 75, HDL 72, LDL 61  HIATAL HERNIA (ICD-553.3) & GERD (ICD-530.81) - she has a HH seen on CXR and CT Chest... takes OMEPRAZOLE 20mg /d and denies heartburn, GERD, regurg, dysphagia, etc... pt  never had EGD or Colonoscopy> she had repeatedly refused to sched a colonoscopy! ~  Q74mo ROVs>  she continues to deny reflux symptoms, abd pain, N/V, etc>  ADENOCARCINOMA of the RECTUM >> Dx 9/12 via colonoscopy for hematochezia & 11/12 underwent laparoscopic assisted low anterior resection by DrByerly [facility] & left ovarian cystectomy (benign) by Samuel Jester; final path = T2 N0 mod diff adenocarcinoma invading into but not through the muscularis, no lymphatic invasion, 12 neg LNs, clear margins... ~  6/13:  She continues to f/u w/ DrByerly; c/o some loose stools, otherw ok... ~  11/13: she had f/u appt w/ DrGessner- Colonoscopy 11/13 showed severe divertics on left, mod on right, clean anastomosis... ~  She continues to f/u w/ DrByerly, [facility] (last visit 2/16)> doing satis, just occas bouts of diarrhea & uses Immodium as needed... They did CEA level but we don't have report.  RECURRENT UTIs>  Routine urinalysis w/ WBC, Bact, Leukocyte+, etc... Treated w/ Cipro as required.  DEGENERATIVE JOINT DISEASE (ICD-715.90) - mild in fingers, no other complaints x "I can't make quick turns anymore"... she notes the Tonic Water Qhs works great for her nightime cramps, & on NEURONTIN 100mg - 2Qhs for ?neuropathy symptoms?  OSTEOPENIA (ICD-733.90) - BMDs here in 2003, 2005, 2007- showed osteopenia/ osteoporosis w/ TScores -1.9 to -2.5.Marland KitchenMarland Kitchen on FOSAMAX, Ca++, Vits... ~  labs 11/09 showed Vit D level = 28... rec to take OTC supplement 1000 u daily. ~  BMD here 7/11 showed TScores -2.3 in Lumbar Spine, and -2.3 in right FemNeck... continue Rx. ~  Labs 5/13 showed Vit D level = 54... rec to continue her 1000u supplement... ~  BMD 6/15>  Lowest Tscore is -2.6 in Lumbar spine; she is rec to continue ALENDRONATE 70mg /wk & calcium/ MVI/ VitD supplements.  ANXIETY (ICD-300.00)  Health Maintenance - GYN= DrHenley & follow up [place] w/ neg exam she says and stool cards were neg by her report... [facility] 6[facility]10 [facility] was  neg... she refuses to consider a colonoscopy... Flu vaccine given each fall... had PNEUMOVAX 2009 & PREVNAR-13 given [id number]... OK TDAP 6/11...   Past Surgical History:  Procedure Laterality Date  . APPENDECTOMY    . COLON RESECTION  02/08/2011   Procedure: LAPAROSCOPIC SIGMOID COLON RESECTION;  Surgeon: Stark Klein, MD;  Location: [facility];  Service: General;  Laterality: N/A;  Laparoscopic Lower Anterior Bowel Resection  . COLON SURGERY    . COLONOSCOPY  11/27/10   rectal cancer, small sigmoid adenoma, diverticulosis  . ECTOPIC PREGNANCY SURGERY     Midline incision  . EP IMPLANTABLE DEVICE N/A 10/16/2015   Procedure: Pacemaker Implant;  Surgeon: Will Meredith Leeds, MD;  Location: [place] [facility];  Service: Cardiovascular;  Laterality: N/A;  . HAMMER TOE SURGERY  1995   Dr. Silverio Decamp  . Laparoscopic Left oophorectomy, laparoscopic low anterior resection, placement of OnQ pain pump  02/08/2011  . NASAL SINUS SURGERY  1994  . OVARIAN CYST REMOVAL  02/08/2011   Procedure: OVARIAN CYSTECTOMY;  Surgeon: Stark Klein, MD;  Location: [facility];  Service: General;  Laterality: Left;  Removal of Left Ovary and Pelvic Mass  . VAGINAL HYSTERECTOMY  1980's    Outpatient Encounter Prescriptions as of 02/24/2016  Medication Sig  . albuterol (PROVENTIL HFA;VENTOLIN HFA) 108 (90 BASE) MCG/ACT inhaler Inhale 2 puffs into the lungs every 6 (six) hours as needed. Wheezing   . alendronate (FOSAMAX) 70 MG tablet TAKE 1 TABLET BY MOUTH ONCE A WEEK WITH A FULL GLASS OF WATER ON AN EMPTY STOMACH  . amLODipine (NORVASC) 5 MG tablet Take 1 tablet (5 mg total) by mouth daily.  Marland Kitchen apixaban (ELIQUIS) 2.5 MG TABS tablet Take 1 tablet (2.5 mg total) by mouth 2 (two) times daily.  Marland Kitchen ascorbic acid (VITAMIN C) 250 MG CHEW Chew 250 mg by mouth daily.   Marland Kitchen atorvastatin (LIPITOR) 20 MG tablet TAKE 1 TABLET (20 MG TOTAL) BY MOUTH AT BEDTIME.  . Calcium Carbonate-Vitamin D (CALCIUM-VITAMIN D) 500-200 MG-UNIT per tablet  Take 2 tablets by mouth daily.   . Cholecalciferol (VITAMIN D3) 1000 UNITS CAPS Take 1 capsule by mouth daily.   . ferrous sulfate 325 (65 FE) MG tablet Take 325 mg by mouth daily with breakfast.  . furosemide (LASIX) 20 MG tablet Take 1 tablet (20 mg total) by mouth daily.  Marland Kitchen loperamide (LOPERAMIDE A-D) 2 MG tablet Take 2 mg by mouth every 6 (six) hours as needed.  Marland Kitchen omeprazole (PRILOSEC) 20 MG capsule Take 20 mg by mouth daily.   . vitamin B-12 (CYANOCOBALAMIN) 250 MCG tablet Take 250 mcg by mouth daily.   . vitamin E (VITAMIN E) 200 UNIT capsule Take 200 Units by mouth daily.   . [DISCONTINUED] alendronate (FOSAMAX) 70 MG tablet TAKE 1 TABLET BY MOUTH ONCE A WEEK WITH A FULL GLASS OF WATER ON AN EMPTY STOMACH   Facility-Administered Encounter Medications as of 02/24/2016  Medication  . ondansetron (ZOFRAN) injection    Allergies  Allergen Reactions  . Sulfonamide Derivatives Nausea And Vomiting    Happened a long time ago, does not remember the other reaction she had.    Current Medications, Allergies, Past Medical History, Past Surgical History, Family History, and Social History were reviewed in [employer] record.   Review of Systems         See HPI - all other systems neg except as noted...  The patient denies anorexia, fever, weight loss, weight gain, vision loss, decreased hearing, hoarseness, chest pain, syncope, dyspnea on exertion, peripheral edema, prolonged cough, headaches, hemoptysis, abdominal pain, melena, hematochezia, severe indigestion/heartburn, hematuria, incontinence, muscle weakness, suspicious skin lesions, transient blindness, difficulty walking, depression, unusual weight change, abnormal bleeding, enlarged lymph nodes, and angioedema.   Objective:   Physical Exam     WD, WN, 82 y/o WF in NAD... GENERAL:  Alert & oriented; pleasant & cooperative... HEENT:  [place]/AT, Glasses, EOM-wnl, PERRLA, EACs-clear, TMs-wnl, NOSE-clear,  THROAT-clear & wnl. NECK:  Supple w/ fair ROM; no JVD; normal carotid impulses w/ faint right Cbruit; no thyromegaly or nodules palpated; no lymphadenopathy. CHEST:  few bibasilar dry rales about 1/3rd way up, no  wheezing, no rhonchi, no cough, etc... HEART:  Regular bradycardia ~40/min; Gr1/6 SEM without rubs or gallops, no msc heard... ABDOMEN:  Surg scar healed, nontender to palp, normal bowel sounds; no organomegaly or masses detected. EXT:  no deformities, mild arthritic changes; no varicose veins/ +venous insuffic/ 1-2+ edema found on 09/11/15 NEURO:  CN's intact; no focal neuro deficits... DERM:  No lesions noted; no rash etc...  RADIOLOGY DATA:  Reviewed in the EPIC EMR & discussed w/ the patient...  LABORATORY DATA:  Reviewed in the EPIC EMR & discussed w/ the patient...   Assessment & Plan:    NEW BRADYARRHYTHMIA/ LE Edema/ BNP=857> ?flutter w/ slow VR, we will start Lasix20 & refer to Cards ASAP for further eval=> AFib/Flutter, CHB=> pacer...   PULM>  AB, Pulm Fibrosis>  Overall stable on prn Proair; CXR reviewed & resp exam w/o changes, stable.  HBP>  BP improved w/ low dose Amlodipine 2.5mg /d, continue same & monitor at home...  MVP>  She is essent asymptomatic w/o CP, palpit, SOB, etc...  R Carotid Bruit> CDoppler w/ 40-59% RICAstenosis, on ASA...  CHOL>  Stable on Lip20 + diet, continue same...  GI> HH, GERD>  Stable on Prilosec, continue same...  RECTAL CANCER>  As above- she is post op 11/12 & had uneventful rehab; slowly improving and diarrhea is diminished w/ Align/ Activia; she will maintain f/u w/ DrByerly & DrGessner ==> f/u colonoscopy 11/13 w/ divertics, no recurrence.  DJD/ Osteopenia>  She remains on Fosamax, Calcium, MVI, Vit D, etc; she will be due for BMD next yr...  UTI>  Prev treated w/ Cipro empirically & currently asymptomatic...  Anxiety>  Stable & she does not require anxiolytic Rx...   Patient's Medications  New Prescriptions   No  medications on file  Previous Medications   ALBUTEROL (PROVENTIL HFA;VENTOLIN HFA) 108 (90 BASE) MCG/ACT INHALER    Inhale 2 puffs into the lungs every 6 (six) hours as needed. Wheezing    AMLODIPINE (NORVASC) 5 MG TABLET    Take 1 tablet (5 mg total) by mouth daily.   APIXABAN (ELIQUIS) 2.5 MG TABS TABLET    Take 1 tablet (2.5 mg total) by mouth 2 (two) times daily.   ASCORBIC ACID (VITAMIN C) 250 MG CHEW    Chew 250 mg by mouth daily.    ATORVASTATIN (LIPITOR) 20 MG TABLET    TAKE 1 TABLET (20 MG TOTAL) BY MOUTH AT BEDTIME.   CALCIUM CARBONATE-VITAMIN D (CALCIUM-VITAMIN D) 500-200 MG-UNIT PER TABLET    Take 2 tablets by mouth daily.    CHOLECALCIFEROL (VITAMIN D3) 1000 UNITS CAPS    Take 1 capsule by mouth daily.    FERROUS SULFATE 325 (65 FE) MG TABLET    Take 325 mg by mouth daily with breakfast.   FUROSEMIDE (LASIX) 20 MG TABLET    Take 1 tablet (20 mg total) by mouth daily.   LOPERAMIDE (LOPERAMIDE A-D) 2 MG TABLET    Take 2 mg by mouth every 6 (six) hours as needed.   OMEPRAZOLE (PRILOSEC) 20 MG CAPSULE    Take 20 mg by mouth daily.    VITAMIN B-12 (CYANOCOBALAMIN) 250 MCG TABLET    Take 250 mcg by mouth daily.    VITAMIN E (VITAMIN E) 200 UNIT CAPSULE    Take 200 Units by mouth daily.   Modified Medications   Modified Medication Previous Medication   ALENDRONATE (FOSAMAX) 70 MG TABLET alendronate (FOSAMAX) 70 MG tablet      TAKE 1 TABLET  BY MOUTH ONCE A WEEK WITH A FULL GLASS OF WATER ON AN EMPTY STOMACH    TAKE 1 TABLET BY MOUTH ONCE A WEEK WITH A FULL GLASS OF WATER ON AN EMPTY STOMACH  Discontinued Medications   No medications on file

## 2016-02-26 ENCOUNTER — Ambulatory Visit: Payer: Medicare Other | Admitting: Pulmonary Disease

## 2016-04-16 DIAGNOSIS — Z85048 Personal history of other malignant neoplasm of rectum, rectosigmoid junction, and anus: Secondary | ICD-10-CM | POA: Diagnosis not present

## 2016-04-20 ENCOUNTER — Other Ambulatory Visit: Payer: Self-pay | Admitting: *Deleted

## 2016-04-20 MED ORDER — APIXABAN 2.5 MG PO TABS: 2.5000 mg | ORAL_TABLET | Freq: Two times a day (BID) | ORAL | 0 refills | Status: DC

## 2016-04-28 ENCOUNTER — Other Ambulatory Visit: Payer: Self-pay | Admitting: Cardiology

## 2016-04-29 ENCOUNTER — Ambulatory Visit (INDEPENDENT_AMBULATORY_CARE_PROVIDER_SITE_OTHER): Payer: Medicare Other | Admitting: *Deleted

## 2016-04-29 DIAGNOSIS — I481 Persistent atrial fibrillation: Secondary | ICD-10-CM | POA: Diagnosis not present

## 2016-04-29 DIAGNOSIS — I4819 Other persistent atrial fibrillation: Secondary | ICD-10-CM

## 2016-04-30 ENCOUNTER — Other Ambulatory Visit: Payer: Self-pay | Admitting: Cardiology

## 2016-04-30 ENCOUNTER — Encounter: Payer: Self-pay | Admitting: Cardiology

## 2016-04-30 NOTE — Progress Notes (Signed)
Remote pacemaker transmission.   

## 2016-04-30 NOTE — Telephone Encounter (Signed)
Medication Detail    Disp Refills Start End   amLODipine (NORVASC) 5 MG tablet 30 tablet 7 04/28/2016    Sig: TAKE 1 TABLET BY MOUTH EVERY DAY   E-Prescribing Status: Receipt confirmed by pharmacy (04/28/2016 3:31 PM EST)   Pharmacy   [id number] - [place], [place]. AT [address]

## 2016-05-04 LAB — CUP PACEART REMOTE DEVICE CHECK
Date Time Interrogation Session: 20180213094553
Implantable Lead Location: 753860
Implantable Pulse Generator Implant Date: 20170727
Lead Channel Setting Pacing Amplitude: 2.5 V
MDC IDC LEAD IMPLANT DT: 20170727
MDC IDC MSMT BATTERY REMAINING PERCENTAGE: 95 % — AB
MDC IDC MSMT LEADCHNL RV IMPEDANCE VALUE: 560 Ohm
MDC IDC MSMT LEADCHNL RV SENSING INTR AMPL: 12 mV
MDC IDC SET LEADCHNL RV PACING PULSEWIDTH: 0.4 ms
MDC IDC SET LEADCHNL RV SENSING SENSITIVITY: 2 mV
MDC IDC STAT BRADY RV PERCENT PACED: 99 %
Pulse Gen Model: 1272
Pulse Gen Serial Number: 7897007

## 2016-05-24 ENCOUNTER — Telehealth: Payer: Self-pay | Admitting: Pulmonary Disease

## 2016-05-24 ENCOUNTER — Encounter: Payer: Self-pay | Admitting: Pulmonary Disease

## 2016-05-24 DIAGNOSIS — Z1231 Encounter for screening mammogram for malignant neoplasm of breast: Secondary | ICD-10-CM | POA: Diagnosis not present

## 2016-05-24 NOTE — Telephone Encounter (Signed)
Patient came into the office wanting to speak to nurse and get these while here.  Advised I would have to put a note back and nurse would call her regarding this.

## 2016-05-24 NOTE — Telephone Encounter (Signed)
Spoke with pt. She would like to have stool sample cards completed. Advised her that she has an upcoming appointment in June and pt would like to have the stool cards completed then. Nothing further was needed.

## 2016-06-24 ENCOUNTER — Other Ambulatory Visit: Payer: Self-pay | Admitting: Pulmonary Disease

## 2016-06-24 MED ORDER — ATORVASTATIN CALCIUM 20 MG PO TABS: 20.0000 mg | ORAL_TABLET | Freq: Every day | ORAL | 3 refills | Status: DC

## 2016-07-17 ENCOUNTER — Other Ambulatory Visit: Payer: Self-pay | Admitting: Internal Medicine

## 2016-07-19 NOTE — Telephone Encounter (Signed)
OV with Dr Curt Bears 01/29/16. Weight 55.1Kg 02/24/[address] OV [address] Lenna Gilford. Labs 10/09/15- SCr 1.08. Age 75yrs old. Continue Eliquis 2.5mg  BID. Rx sent.

## 2016-07-29 ENCOUNTER — Ambulatory Visit (INDEPENDENT_AMBULATORY_CARE_PROVIDER_SITE_OTHER): Payer: Medicare Other | Admitting: *Deleted

## 2016-07-29 DIAGNOSIS — I481 Persistent atrial fibrillation: Secondary | ICD-10-CM

## 2016-07-29 DIAGNOSIS — I4819 Other persistent atrial fibrillation: Secondary | ICD-10-CM

## 2016-07-29 NOTE — Progress Notes (Signed)
Remote pacemaker transmission.   

## 2016-07-30 LAB — CUP PACEART REMOTE DEVICE CHECK
Battery Remaining Longevity: 123 mo
Brady Statistic RV Percent Paced: 99 %
Date Time Interrogation Session: 20180510060013
Implantable Lead Implant Date: 20170727
Implantable Pulse Generator Implant Date: 20170727
Lead Channel Impedance Value: 580 Ohm
Lead Channel Pacing Threshold Amplitude: 0.5 V
Lead Channel Sensing Intrinsic Amplitude: 12 mV
Lead Channel Setting Pacing Amplitude: 2.5 V
MDC IDC LEAD LOCATION: 753860
MDC IDC MSMT BATTERY REMAINING PERCENTAGE: 95.5 %
MDC IDC MSMT BATTERY VOLTAGE: 3.02 V
MDC IDC MSMT LEADCHNL RV PACING THRESHOLD PULSEWIDTH: 0.4 ms
MDC IDC PG SERIAL: 7897007
MDC IDC SET LEADCHNL RV PACING PULSEWIDTH: 0.4 ms
MDC IDC SET LEADCHNL RV SENSING SENSITIVITY: 2 mV
Pulse Gen Model: 1272

## 2016-08-24 ENCOUNTER — Other Ambulatory Visit (INDEPENDENT_AMBULATORY_CARE_PROVIDER_SITE_OTHER): Payer: Medicare Other

## 2016-08-24 ENCOUNTER — Ambulatory Visit (INDEPENDENT_AMBULATORY_CARE_PROVIDER_SITE_OTHER): Payer: Medicare Other | Admitting: Pulmonary Disease

## 2016-08-24 ENCOUNTER — Ambulatory Visit (INDEPENDENT_AMBULATORY_CARE_PROVIDER_SITE_OTHER)
Admission: RE | Admit: 2016-08-24 | Discharge: 2016-08-24 | Disposition: A | Discharge status: Home / Self Care | Payer: Medicare Other | Source: Ambulatory Visit | Attending: Pulmonary Disease | Admitting: Pulmonary Disease

## 2016-08-24 ENCOUNTER — Encounter: Payer: Self-pay | Admitting: Pulmonary Disease

## 2016-08-24 VITALS — BP 132/74 | HR 60 | Ht 67.0 in | Wt 124.0 lb

## 2016-08-24 DIAGNOSIS — I481 Persistent atrial fibrillation: Secondary | ICD-10-CM | POA: Diagnosis not present

## 2016-08-24 DIAGNOSIS — Z950 Presence of cardiac pacemaker: Secondary | ICD-10-CM

## 2016-08-24 DIAGNOSIS — I10 Essential (primary) hypertension: Secondary | ICD-10-CM

## 2016-08-24 DIAGNOSIS — M150 Primary generalized (osteo)arthritis: Secondary | ICD-10-CM

## 2016-08-24 DIAGNOSIS — K219 Gastro-esophageal reflux disease without esophagitis: Secondary | ICD-10-CM | POA: Diagnosis not present

## 2016-08-24 DIAGNOSIS — C20 Malignant neoplasm of rectum: Secondary | ICD-10-CM

## 2016-08-24 DIAGNOSIS — M899 Disorder of bone, unspecified: Secondary | ICD-10-CM | POA: Diagnosis not present

## 2016-08-24 DIAGNOSIS — M159 Polyosteoarthritis, unspecified: Secondary | ICD-10-CM

## 2016-08-24 DIAGNOSIS — I6523 Occlusion and stenosis of bilateral carotid arteries: Secondary | ICD-10-CM

## 2016-08-24 DIAGNOSIS — I4819 Other persistent atrial fibrillation: Secondary | ICD-10-CM

## 2016-08-24 DIAGNOSIS — J8410 Pulmonary fibrosis, unspecified: Secondary | ICD-10-CM | POA: Diagnosis not present

## 2016-08-24 DIAGNOSIS — M949 Disorder of cartilage, unspecified: Secondary | ICD-10-CM | POA: Diagnosis not present

## 2016-08-24 DIAGNOSIS — I779 Disorder of arteries and arterioles, unspecified: Secondary | ICD-10-CM

## 2016-08-24 DIAGNOSIS — K5730 Diverticulosis of large intestine without perforation or abscess without bleeding: Secondary | ICD-10-CM

## 2016-08-24 DIAGNOSIS — I059 Rheumatic mitral valve disease, unspecified: Secondary | ICD-10-CM

## 2016-08-24 DIAGNOSIS — F411 Generalized anxiety disorder: Secondary | ICD-10-CM | POA: Diagnosis not present

## 2016-08-24 DIAGNOSIS — E7800 Pure hypercholesterolemia, unspecified: Secondary | ICD-10-CM | POA: Diagnosis not present

## 2016-08-24 DIAGNOSIS — I739 Peripheral vascular disease, unspecified: Secondary | ICD-10-CM

## 2016-08-24 LAB — LIPID PANEL
Cholesterol: 183 mg/dL (ref 0–200)
HDL: 83.1 mg/dL (ref >39.00)
LDL CALC: 80 mg/dL (ref 0–99)
NONHDL: 100.39
Total CHOL/HDL Ratio: 2
Triglycerides: 101 mg/dL (ref 0.0–149.0)
VLDL: 20.2 mg/dL (ref 0.0–40.0)

## 2016-08-24 LAB — CBC WITH DIFFERENTIAL/PLATELET
BASOS ABS: 0 10*3/uL (ref 0.0–0.1)
Basophils Relative: 0.6 % (ref 0.0–3.0)
Eosinophils Absolute: 0.1 10*3/uL (ref 0.0–0.7)
Eosinophils Relative: 1.4 % (ref 0.0–5.0)
HCT: 40.3 % (ref 36.0–46.0)
Hemoglobin: 13.5 g/dL (ref 12.0–15.0)
LYMPHS ABS: 1.5 10*3/uL (ref 0.7–4.0)
Lymphocytes Relative: 19.6 % (ref 12.0–46.0)
MCHC: 33.5 g/dL (ref 30.0–36.0)
MCV: 98.9 fl (ref 78.0–100.0)
MONO ABS: 0.7 10*3/uL (ref 0.1–1.0)
MONOS PCT: 8.6 % (ref 3.0–12.0)
NEUTROS ABS: 5.4 10*3/uL (ref 1.4–7.7)
NEUTROS PCT: 69.8 % (ref 43.0–77.0)
PLATELETS: 259 10*3/uL (ref 150.0–400.0)
RBC: 4.08 Mil/uL (ref 3.87–5.11)
RDW: 13.4 % (ref 11.5–15.5)
WBC: 7.7 10*3/uL (ref 4.0–10.5)

## 2016-08-24 LAB — COMPREHENSIVE METABOLIC PANEL
ALK PHOS: 61 U/L (ref 39–117)
ALT: 8 U/L (ref 0–35)
AST: 18 U/L (ref 0–37)
Albumin: 4.6 g/dL (ref 3.5–5.2)
BUN: 30 mg/dL — AB (ref 6–23)
CHLORIDE: 103 meq/L (ref 96–112)
CO2: 29 meq/L (ref 19–32)
Calcium: 9.8 mg/dL (ref 8.4–10.5)
Creatinine, Ser: 1.42 mg/dL — ABNORMAL HIGH (ref 0.40–1.20)
GFR: 36.78 mL/min — AB (ref >60.00)
GLUCOSE: 117 mg/dL — AB (ref 70–99)
POTASSIUM: 5.1 meq/L (ref 3.5–5.1)
SODIUM: 141 meq/L (ref 135–145)
Total Bilirubin: 0.9 mg/dL (ref 0.2–1.2)
Total Protein: 8.3 g/dL (ref 6.0–8.3)

## 2016-08-24 LAB — TSH: TSH: 2.61 u[IU]/mL (ref 0.35–4.50)

## 2016-08-24 MED ORDER — FUROSEMIDE 20 MG PO TABS: 20.0000 mg | ORAL_TABLET | Freq: Every day | ORAL | 11 refills | Status: DC | PRN

## 2016-08-24 NOTE — Progress Notes (Signed)
Subjective:    Patient ID: Hannah Mercado, female    DOB: [birth year], 83 y.o.   MRN: [id number]  HPI 83 y/o WF here for a follow up visit... ~  SEE PREV EPIC NOTES FOR OLDER DATA >>     2DEcho 2/04 showing MVP, mild MR, normal LVF... StressEcho w/ EF=60%... Cardiolite 12/03 was normal w/o ischemia and EF=65%.  Baseline EKG 02/08/11>  NSR, rate60, 1st degree AVB w/ PR=.22  CXR 11/13 showed borderline cardiomeg, ectasia of the Ao, chronic lung changes/ NAD, DJD/ spondylosis...  LABS 11/13:  Chems- wnl;  CBC- wnl;  UA- ok...   LABS 5/14:  FLP- at goals on Lip20;  Chems- wnl;  CBC- wnl;  TSH=2.61;  VitD=56;  UA- few wbc, no bact...  CXR => done 6/15>  Borderline cardiomeg, prominent PAs, calcif Ao arch, COPD w/ sl incr markings/ NAD, DJD in Tspine...  LABS 5/15:  FLP- within parameters on Lip20;  Chems- wnl;  CBC- ok w/ Hg=12.6;  TSH=2.41;  VitD=55 on 1000u daily...  BMD => done 6/15>  Lowest Tscore is -2.6 in Lumbar spine; she is rec to continue ALENDRONATE 70mg /wk & calcium/ MVI/ VitD supplements...   ~  August 28, 2014:  39mo ROV & [name] will soon be 35!  Planning a trip to [place] to visit daugh in Aug;  She does crosswords everyday!  She reports doing satis, no new complaints or concerns- taking nutritional supplements & wt is up a few lbs...    Hx AB, pulm fibrosis> on ProventilHFA prn (rarely uses); no resp symptoms & breathing good, & she denies cough, phlegm, SOB, etc; CXR w/o acute changes...    MVP, HBP> on ASA81 & Amlod2.5; BP= 136/80 & similar at home; stable w/ msc & gr1/6 SEM; no CP, palpit, ch in SOB, edema, etc...    Right carotid bruit> no cerebral ischemic symptoms; CDoppler 6/16 showed 40-59% RICA stensis, Rx ASA...    Chol> remains on Lip20 & FLP 6/16 looks great w/ TChol 187, TG 83, HDL 93, LDL 78    HH, GERD> on Prilosec20mg /d + antireflux regimen in view of her HH etc; she denies abd pain, n/v, notes intermit diarrhea, no blood seen...    Hx rectal cancer> s/p  lap low anterior resection 11/12 by DrByerly; f/u colonoscopy 11/13 by DrGessner- mod divertics, anastomosis ok; she sees DrByerly Q6-19mo- notes reviewed still w/ occas loose stool (uses Immodium prn); on Align & Activia; she's avoiding lactose & gas is improved, improved after nutrition consult;  CEA level 2/15 = 3.2 (stable); CEA 2/16 by DrByerly & we don't have report.    DJD, Osteopenia> on Fosamax70/wk along w/ Ca, MVI, VitD; last BMD 6/15 w/ lowest Tscore-2.6 (continue same rx), she is getting along well but hasn't played much golf since her surg...    Other problems as noted... She likes to take a number of supplements... We reviewed prob list, meds, xrays and labs> see below for updates >>   CXR 6/16 showed borderline heart size, hyperinflated lungs, icr markings bilat, NAD/ no change...  LABS 6/16:  FLP- at goals on Atorva20;  Chems- wnl;  CBC- wnl;  TSH=3.86;  UA- clear... NOTE> CEA done 2/16 by DrByerly & we do not have results.  CDopplers 6/16 showed irreg mixed plaque bilat, 40-59% RICAstenosis & 0-10% LICAstenosis, norm subclav & antegrade vertebrals..The current medical regimen is effective;  continue present plan and medications. ASA daily.  ~  March 12, 2015:  14mo ROV & [name] turned  89 this past summer- surprise party by her 2 children w/ 40 people & she had a blast; she is going to family on [place] for 44mo over the holidays; clinically doing very well- feels good, still does crosswords daily, no new complaints or concerns... She is reminded to drink 1-2 can Ensure/ Sustacal/ Boost daily to help her gain wt...     She has a ProairHFA inhaler but hasn't needed it in yrs she says; breathing stable w/ her chr lung dis- Hx AB & pulm fibrosis, she denies much cough/ sput/ CP/ SOB...     BP & MVP remain stable on low dose Amlodipine 2.5mg /d; BP= 128/80 & she remains relatively asymptomatic; we reviewed exercise program...     Chol is treated w/ Lip20 & last FLP 6/16 looked good w/ TChol  187, TG 83, HDL 93, LDL 78; she has lost wt down to 123# w/ BMI=20 5 encouraged to eat more + supplement...    Hx adenoca of rectum, s/p surg 11/12- followed by DrByerly & DrGessner, stable & no known recurrence...    Hx DJD, osteopenia on Fosamax, VitD, MVI, & OTC analgesics as needed...    Nocturnal leg cramps> she thinks neuropathy but tonic water & prev Quinine rx helped...  We reviewed prob list, meds, xrays and labs>  IMP/PLAN>>  [name] is doing remarkably well at age 50;  We discussed continuing the same meds, and incr nutrtitional supplements;  We will plan ROV w/ CXR & Labs in 7mo...  ~  September 11, 2015:  31mo ROV & pulmonary/ medical follow up visit>  [name] reports a good 77mo interval but notes LE edema x 3-4wks;  She has gained 9# from her baseline 7mo ago (123#) to 132# today assoc w/ 2+pitting edema in both legs which is new; she denies dietary sodium excess or prev hx of edema=> see below as we review the following medical problems during today's office visit >>     Hx AB, pulm fibrosis> on ProventilHFA prn (rarely uses); no resp symptoms & breathing good, & she denies cough, phlegm, SOB, etc; CXR today shows cardiomeg & interstitial edema...    MVP, HBP> on ASA81 & Amlod2.5; BP= 138/70 but pulse is slow today w/ HR ~40 & she denies CP, palpit, dizzy/ syncope/ near-syncope, change in DOE etc...    Cardiac arrhythmia> new finding on today's EKG=> ?Flutter w/ slow VR, rate40; she has the new edema, interstitial changes on CXR & BNP=857 => referred to [facility] ASAP for eval...    Right carotid bruit> no cerebral ischemic symptoms; CDoppler 6/17 shows no change in 40-59% RICA stenosis, Rx ASA...    Chol> remains on Lip20 & FLP 6/17 looks great w/ TChol 148, TG 75, HDL 72, LDL 61    HH, GERD> on Prilosec20mg /d + antireflux regimen in view of her HH etc; she denies abd pain, n/v, notes intermit diarrhea, no blood seen...    Hx rectal cancer> s/p lap low anterior resection 11/12 by DrByerly; f/u  colonoscopy 11/13 by DrGessner- mod divertics, anastomosis ok; she sees DrByerly Q30mo- notes reviewed still w/ occas loose stool (uses Immodium prn); on Align & Activia; she's avoiding lactose & gas is improved, improved after nutrition consult;  CEA level 2/15 = 3.2 (stable); CEA 2/16 & 2/17 by DrByerly & we don't have report.    DJD, Osteopenia> on Fosamax70/wk along w/ Ca, MVI, VitD; last BMD 6/15 w/ lowest Tscore-2.6 (continue same rx), she is getting along well but hasn't played much  golf since her surg...    Other problems as noted... She likes to take a number of supplements... EXAM shows Afeb, VSS x pulse=40; O2sat=96% on RA; Wt=132#, BMI=15;  HEENT- neg, mallampati1, R carotid bruit;  Chest- decr BS w/ few scat rhonchi, bibasilar rales- unchanged;  Heart- brady rate ~40, gr 1-2/6 SEM w/o r/g;  Abd- thin soft, neg;  Ext- VI, 2+edema in LEs w/o c/c;  Neuro- intact...  CDoppler 09/04/15>  Heterogeneous plaque bilat, stable 40-595 RICA stenosis & 6-96% LICA stenosis, subclav & vertebrals ok...  CXR 09/11/15>  Mod cardiomegaly, incr interstitial markings suggesting poss edema, increased markings at bases...  EKG 09/11/15>  Bradycardia, rate ~40, ?AFlutter, poor R progression V1-2...  LABS 09/11/15>  FLP- all parameters at goals on Lip20;  Chems- wnl w/ Cr=0.94, BS=112, BNP=857;  CBC- wnl;  TSH=4.80;  VitD=60;  UA- clear...  IMP/PLAN>>  While still essentially asymptomatic at age 61, Marisel has developed 2+pitting edema in LEs over the last 3weeks or so & has a bradycardia on EKG w/ ?Flutter & slow VR, only on Amlod2.5, BNP is elev at 857=> we will start Lasix20 & refer for Cards eval ASAP.    ~  February 24, 2016:  6 month [place] had Pacer placed in July- [place] for [facility]> most recent f/u 01/2016-- hx AFib/Flutter, low HR from CHB=> pacer placed 10/16/15, doing satis, planning trip to [place] for [place]... On Eliquis, no further AFib on device check... We reviewed the following medical  problems during today's office visit >>     Hx AB, pulm fibrosis> on ProventilHFA prn (rarely uses); no resp symptoms & breathing good, & she denies cough, phlegm, SOB, etc; CXR today shows cardiomeg & interstitial edema...    MVP, HBP> on Amlod2.5 & Lasix20; BP= 124/70 but pulse is slow today w/ HR ~40 & she denies CP, palpit, dizzy/ syncope/ near-syncope, change in DOE etc...    Cardiac arrhythmia=> Fib/Flutter & CHB=>Pacer> new finding 08/2015=> ?Flutter w/ slow VR, rate40; she has the new edema, interstitial changes on CXR & BNP=857 => referred to [facility]    Right carotid bruit> no cerebral ischemic symptoms; CDoppler 6/17 shows no change in 40-59% RICA stenosis, Rx ASA...    Chol> remains on Lip20 & FLP 6/17 looks great w/ TChol 148, TG 75, HDL 72, LDL 61    HH, GERD> on Prilosec20mg /d + antireflux regimen in view of her HH etc; she denies abd pain, n/v, notes intermit diarrhea, no blood seen...    Hx rectal cancer> s/p lap low anterior resection 11/12 by DrByerly; f/u colonoscopy 11/13 by DrGessner- mod divertics, anastomosis ok; she sees DrByerly Q30mo- notes reviewed still w/ occas loose stool (uses Immodium prn); on Align & Activia; she's avoiding lactose & gas is improved, improved after nutrition consult;  CEA level 2/15 = 3.2 (stable); CEA 2/16 & 2/17 by DrByerly & we don't have report.    DJD, Osteopenia> on Fosamax70/wk along w/ Ca, MVI, VitD; last BMD 6/15 w/ lowest Tscore-2.6 (continue same rx), she is getting along well but hasn't played much golf since her surg...    Other problems as noted... She likes to take a number of supplements... EXAM shows Afeb, VSS; O2sat=97% on RA; Wt=122#, BMI=15;  HEENT- neg, mallampati1, R carotid bruit;  Chest- decr BS w/ few scat rhonchi, bibasilar rales- unchanged;  Heart- RR rate70, gr 1-2/6 SEM w/o r/g;  Abd- thin soft, neg;  Ext- VI, +tr edema in LEs w/o c/c;  Neuro- intact... IMP/PLAN>>  Elisia is stable & looking forward to Xmas in [place] w/  family; continue same meds and follow up w/ [place] & [facility]...   ~  August 24, 2016:  13mo ROV & Michelle reports a good interval- she notes sl wobbly on occas & wonders about her Lasix but BP is good & edema resolved;  She reports that she's going to daugh's in [place] for 52mo soon... we reviewed the following medical problems during today's office visit >>     Hx AB, pulm fibrosis> on ProventilHFA prn (rarely uses); no resp symptoms & breathing good, & she denies cough, phlegm, SOB, etc; CXR today shows cardiomeg & interstitial edema...    MVP, HBP> on Amlod5 & Lasix20; BP= 124/70 but pulse is slow today w/ HR ~40 & she denies CP, palpit, dizzy/ syncope/ near-syncope, change in DOE etc...    Cardiac arrhythmia=> Fib/Flutter & CHB=>Pacer> new finding 08/2015=> hx AFib/Flutter, low HR from CHB=> pacer placed 10/16/15, on Eliquis2.5Bid, no further AFib on device checks...    Right carotid bruit> on ASA81; no cerebral ischemic symptoms; CDoppler 6/17 shows no change in 40-59% RICA stenosis, but f/u CDopplers 6/18 showed bilat 1-39% ICAstenoses, continue same...    Chol> remains on Lip20 & FLP 6/18 looks great w/ TChol 183, TG 101, HDL 83, LDL 80... continue same.    HH, GERD> on Prilosec20mg /d + antireflux regimen in view of her [place] etc; she denies abd pain, n/v, notes intermit diarrhea, no blood seen...    Hx rectal cancer> s/p lap low anterior resection 11/12 by DrByerly; f/u colonoscopy 11/13 by DrGessner- mod divertics, anastomosis ok; she sees DrByerly Q48mo- notes reviewed still w/ occas loose stool (uses Immodium prn); on Align & Activia; she's avoiding lactose & gas is improved, improved after nutrition consult;  CEA levels have been in the 3's but f/u 08/24/16= 4.6 & we will recheck in 74mo.    Mild renal insuffic>  Prev renal function wnl (0.94-1.08) and labs 6/18 showed Cr=1.42 on Lasix20/d... OK to change to "prn edema" & reminded to incr water intake.    DJD, Osteopenia> on Fosamax70/wk along w/ Ca, MVI, VitD;  BMD recheck 6/18 showed lowest Tscore-2.5 & she is in need of a drug holiday starting now- she is getting along well but hasn't played much golf since her surg...    Other problems as noted... She likes to take a number of supplements... EXAM shows Afeb, VSS; O2sat=98% on RA; Wt=124#, BMI=15;  HEENT- neg, mallampati1, R carotid bruit;  Chest- decr BS w/ few scat rhonchi, bibasilar rales- unchanged;  Heart- RR rate70, gr 1-2/6 SEM w/o r/g;  Abd- thin soft, neg;  Ext- VI, +tr edema in LEs w/o c/c;  Neuro- intact...  CXR 08/24/16>  Cardiomeg, pacer, overinflation/ COPD, bibasilar scarring, NAD...   LABS 08/24/16>  FLP- at goals on Atorva20;  Chems- ok x BS=117, Cr=1.42;  CBC- wnl w/ Hg=13.5;  Stool neg;  TSH=2.61;  CEA=4.6 (up from 3.2);  VitD=55;  UA shows UTI w/ +leukocytes/ nitrite pos/ +bact=> Rx w/ CIPRO  Bone Density 09/01/16>  Lowest Tscore is -2.5 in Lumbar spine, and -2.4 in RFN... She has been on Bisphos Rx for yrs & in need of a drug holiday-- Rec to stop after current supply gone & continue calcium, VitD, wt bearing exercise; we will plan f/u BMD at the 3yr interval...   CDopplers 09/09/16 showed heterogen plaque, bilat 1-39% ICA stenoses, >50% RECA stenosis, normal subclav &vertebrals... Stable to improved  on ASA81- continue same... IMP/PLAN>>  OK for Avalene to try off the Lasix20/d to "prn" for edema;  UTI treated w/ Cipro250Bid x 7d;  BMD is sl improved & she is in need of a bisphos drug holiday- therefore finish to current supply & then stop the Fosamax but continue dietary calcium, vit D supplement and wt bearing exercise;  DrByerly wanted her to have stool cards=> neg for blood, but f/u CEA is 4.6 => rec to recheck in 17mo...           Problem List:   pt's cell # [phone]  ALLERGY (ICD-995.3) - uses OTC meds as needed.  ASTHMATIC BRONCHITIS, ACUTE (ICD-466.0) - uses PROAIR as needed, but doing well, breathing OK. ~  7/09:  AB exac Rx w/ Pred, Avelox, Mucinex, etc... symptoms  resolved. ~  CXR: baseline film w/ mild cardiomeg, scarring, chr changes, HH seen, & NAD.Marland Kitchen. ~  f/u CXR 6/11 & 7/12>  no change... ~  CXR 11/13 showed borderline cardiomeg, ectasia of the Ao, chronic lung changes/ NAD, DJD/ spondylosis..  ~  CXR 6/15>  Borderline cardiomeg, prominent PAs, calcif Ao arch, COPD w/ sl incr markings/ NAD, DJD in Tspine ~  CXR 6/16 showed borderline heart size, hyperinflated lungs, icr markings bilat, NAD/ no change ~  CXR 6/17 showed mod cardiomegaly, incr interstitial markings suggesting poss edema, increased markings at bases... ~  CXR 6/18 showed cardiomeg, pacer, overinflation/ COPD, bibasilar scarring, NAD...   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 exam showed bibasilar velcro rales at bases...  HYPERTENSION >> on NORVASC 5mg - 1/2 tab daily... ~  1/13: BP= 160/80 & we decided to start Norvasc5mg /d... subeq cut down to 1/2 tab per phone message. ~  3/13: BP= 154/84 & repeat 124/74; she feels well & denies CP, palpit, SOB, edema, etc; continue same... ~  5/13:  BP= 150/68 & she remains asymptomatic &in good spirits; ok to continue same for now, monitor BP at home. ~  11/13:  BP= 122/70 & she continues to gain strength slowly... ~  11/14: on Amlod5-1/2 daily; BP= 116.62 & she denies CP, palpit, SOB, edema... ~  5/15: on ASA81 & Amlod2.5; BP= 124/70 & similar at home; stable w/ msc & gr1/6 SEM; no CP, palpit, ch in SOB, edema, etc ~  12/15: on ASA81 & Amlod2.5; BP= 122/62 and stable, doing satis... ~  6/16: on ASA81 & Amlod2.5; BP= 136/80 & similar at home; stable w/ msc & gr1/6 SEM; no CP, palpit, ch in SOB, edema, etc ~  6/17: on ASA81 & Amlod2.5; BP= 138/70 but pulse is slow today w/ HR ~40 & she denies CP, palpit, dizzy/ syncope/ near-syncope, change in DOE etc... ~  6/18 on ASA81 & Amlod5 & Lasix20;  BP= 132/74 & she denies CP, palpit, SOB, edema...  MITRAL VALVE PROLAPSE >> Atrial  Fib/ Flutter Complete heart block => PACER 09/2015 ~  had 2DEcho 2/04 showing MVP, mild MR, normal LVF... StressEcho w/ EF=60%... subseq Cardiolite 12/03 was normal w/o ischemia and EF=65%... denies CP, palpit, dizzy, syncope, edema, etc; she is gradually increasing activities post op... ~  EKG 11/12 showed NSR, rate60, sl incr voltage, NAD... ~  6/17> Cardiac arrhythmia> new finding on today's EKG=> ?Flutter w/ slow VR, rate40; she has the new edema, interstitial changes on CXR & BNP=857 => referred to [facility] ASAP for eval... ~  09/2015> [facility]er [facility] for [facility];  subseq device checks w/o recurrent AFib, pt on Eliquis... ~  08/2016> on Eliquis2.5Bid & stable...  Right Carotid Bruit>  no cerebral ischemic symptoms; CDoppler 6/16 showed 40-59% RICA stenosis, Rx ASA. ~  no cerebral ischemic symptoms; CDoppler 6/17 shows no change in 40-59% RICA stenosis, Rx ASA... ~  CDoppler 08/2016>  heterogen plaque, bilat 1-39% ICA stenoses, >50% RECA stenosis, normal subclav &vertebrals... Stable to improved on ASA81- continue same...  HYPERCHOLESTEROLEMIA (ICD-272.0) - controlled on LIPITOR 20mg /d,  and tol well...  ~  labs 7/08 showed TChol 183, TG 111, HDL 65, LDL 96... Doing well, continue Lip20. ~  FLP 11/09 showed TChol 189, TG 70, HDL 71, LDL 104 ~  FLP 5/10 showed TChol 173, TG 53, HDL 75, LDL 87... Remains stable on Lip20. ~  FLP 6/11 showed TChol 183, TG 103, HDL 74, LDL 88 ~  FLP 7/12 showed TChol 182, TG 127, HDL 74, LDL 82... Continues stable on Lip20. ~  FLP 5/13 on Lip20 showed TChol 167, TG 79, HDL 82, LDL 69... Continue same. ~  FLP 5/14 on Lip20 showed TChol 174, TG 64, HDL 78, LDL 83 ~  FLP 5/15 on Lip20 showed TChol 184, TG 84, HDL 83, LDL 84 ~  FLP 6/16 on Lip20 showed TChol 187, TG 83, HDL 93, LDL 78 ~  FLP 6/17 on Lip20 showed TChol 148, TG 75, HDL 72, LDL 61 ~  FLP 6/18 on Lip20 showed TChol 183, TG 101, HDL 83, LDL 80   HIATAL HERNIA (ICD-553.3) & GERD (ICD-530.81) - she has a  HH seen on CXR and CT Chest... takes OMEPRAZOLE 20mg /d and denies heartburn, GERD, regurg, dysphagia, etc... pt never had EGD or Colonoscopy> she had repeatedly refused to sched a colonoscopy! ~  Q43mo ROVs>  she continues to deny reflux symptoms, abd pain, N/V, etc>  ADENOCARCINOMA of the RECTUM >> Dx 9/12 via colonoscopy for hematochezia & 11/12 underwent laparoscopic assisted low anterior resection by DrByerly [facility] & left ovarian cystectomy (benign) by Samuel Jester; final path = T2 N0 mod diff adenocarcinoma invading into but not through the muscularis, no lymphatic invasion, 12 neg LNs, clear margins... ~  6/13:  She continues to f/u w/ DrByerly; c/o some loose stools, otherw ok... ~  11/13: she had f/u appt w/ DrGessner- Colonoscopy 11/13 showed severe divertics on left, mod on right, clean anastomosis... ~  She continues to f/u w/ DrByerly, CCS (last visit 2/16)> doing satis, just occas bouts of diarrhea & uses Immodium as needed... They did CEA level but we don't have report. ~  She continues to f/u w/ DrByerly yearly- we checked stool cards 6/18- NEG; we did CEA 6/18 => 4.6 & we will recheck in 93mo  RECURRENT UTIs>  Routine urinalysis w/ WBC, Bact, Leukocyte+, etc... Treated w/ Cipro as required.  DEGENERATIVE JOINT DISEASE (ICD-715.90) - mild in fingers, no other complaints x "I can't make quick turns anymore"... she notes the Tonic Water Qhs works great for her nightime cramps, & on NEURONTIN 100mg - 2Qhs for ?neuropathy symptoms?  OSTEOPENIA (ICD-733.90) - BMDs here in 2003, 2005, 2007- showed osteopenia/ osteoporosis w/ TScores -1.9 to -2.5.Marland KitchenMarland Kitchen on FOSAMAX, Ca++, Vits... ~  labs 11/09 showed Vit D level = 28... rec to take OTC supplement 1000 u daily. ~  BMD here 7/11 showed TScores -2.3 in Lumbar Spine, and -2.3 in right FemNeck... continue Rx. ~  Labs 5/13 showed Vit D level = 54... rec to continue her 1000u supplement... ~  BMD 6/15>  Lowest  Tscore is -2.6 in Lumbar spine; she is rec to  continue ALENDRONATE 70mg /wk & calcium/ MVI/ VitD supplements. ~  BMD 6/18>  Lowest Tscore is -2.5 in Lumbar spine, and -2.4 in RFN;  She is in need of bisphos drug holday=> stop now for the next 83yr cycle & recheck BMD in June 2020  ANXIETY (ICD-300.00)  Health Maintenance - GYN= DrHenley & follow up Jul10 w/ neg exam she says and stool cards were neg by her report... [facility] 6[facility]10 [facility] was neg... she refuses to consider a colonoscopy... Flu vaccine given each fall... had PNEUMOVAX 2009 & PREVNAR-13 given [id number]... OK TDAP 6/11...   Past Surgical History:  Procedure Laterality Date  . APPENDECTOMY    . COLON RESECTION  02/08/2011   Procedure: LAPAROSCOPIC SIGMOID COLON RESECTION;  Surgeon: Stark Klein, MD;  Location: [facility];  Service: General;  Laterality: N/A;  Laparoscopic Lower Anterior Bowel Resection  . COLON SURGERY    . COLONOSCOPY  11/27/10   rectal cancer, small sigmoid adenoma, diverticulosis  . ECTOPIC PREGNANCY SURGERY     Midline incision  . EP IMPLANTABLE DEVICE N/A 10/16/2015   Procedure: Pacemaker Implant;  Surgeon: Will Meredith Leeds, MD;  Location: [facility];  Service: Cardiovascular;  Laterality: N/A;  . HAMMER TOE SURGERY  1995   Dr. Silverio Decamp  . Laparoscopic Left oophorectomy, laparoscopic low anterior resection, placement of OnQ pain pump  02/08/2011  . NASAL SINUS SURGERY  1994  . OVARIAN CYST REMOVAL  02/08/2011   Procedure: OVARIAN CYSTECTOMY;  Surgeon: Stark Klein, MD;  Location: [facility];  Service: General;  Laterality: Left;  Removal of Left Ovary and Pelvic Mass  . VAGINAL HYSTERECTOMY  1980's    Outpatient Encounter Prescriptions as of 08/24/2016  Medication Sig  . albuterol (PROVENTIL HFA;VENTOLIN HFA) 108 (90 BASE) MCG/ACT inhaler Inhale 2 puffs into the lungs every 6 (six) hours as needed. Wheezing   . alendronate (FOSAMAX) 70 MG tablet TAKE 1 TABLET BY MOUTH ONCE A WEEK WITH A FULL GLASS OF WATER ON AN EMPTY STOMACH  .  amLODipine (NORVASC) 5 MG tablet TAKE 1 TABLET BY MOUTH EVERY DAY  . ascorbic acid (VITAMIN C) 250 MG CHEW Chew 250 mg by mouth daily.   Marland Kitchen atorvastatin (LIPITOR) 20 MG tablet Take 1 tablet (20 mg total) by mouth daily.  . Calcium Carbonate-Vitamin D (CALCIUM-VITAMIN D) 500-200 MG-UNIT per tablet Take 2 tablets by mouth daily.   . Cholecalciferol (VITAMIN D3) 1000 UNITS CAPS Take 1 capsule by mouth daily.   Marland Kitchen ELIQUIS 2.5 MG TABS tablet TAKE 1 TABLET (2.5 MG TOTAL) BY MOUTH 2 (TWO) TIMES DAILY.  . ferrous sulfate 325 (65 FE) MG tablet Take 325 mg by mouth daily with breakfast.  . furosemide (LASIX) 20 MG tablet Take 1 tablet (20 mg total) by mouth daily as needed.  . loperamide (LOPERAMIDE A-D) 2 MG tablet Take 2 mg by mouth every 6 (six) hours as needed.  Marland Kitchen omeprazole (PRILOSEC) 20 MG capsule Take 20 mg by mouth daily.   . vitamin B-12 (CYANOCOBALAMIN) 250 MCG tablet Take 250 mcg by mouth daily.   . vitamin E (VITAMIN E) 200 UNIT capsule Take 200 Units by mouth daily.   . [DISCONTINUED] furosemide (LASIX) 20 MG tablet Take 1 tablet (20 mg total) by mouth daily.   Facility-Administered Encounter Medications as of 08/24/2016  Medication  . ondansetron (ZOFRAN) injection    Allergies  Allergen Reactions  . Sulfonamide Derivatives Nausea And Vomiting  Happened a long time ago, does not remember the other reaction she had.    Immunization History  Administered Date(s) Administered  . Influenza Split 01/02/2011, 01/03/2012, 12/20/2012  . Influenza Whole 12/20/2009  . Influenza, High Dose Seasonal PF 05/24/2016  . Influenza,inj,Quad PF,36+ Mos 01/21/2014, 12/21/2014  . Pneumococcal Conjugate-13 02/25/2014  . Pneumococcal Polysaccharide-23 03/22/2005    Current Medications, Allergies, Past Medical History, Past Surgical History, Family History, and Social History were reviewed in [employer] record.   Review of Systems         See HPI - all other systems neg  except as noted...  The patient denies anorexia, fever, weight loss, weight gain, vision loss, decreased hearing, hoarseness, chest pain, syncope, dyspnea on exertion, peripheral edema, prolonged cough, headaches, hemoptysis, abdominal pain, melena, hematochezia, severe indigestion/heartburn, hematuria, incontinence, muscle weakness, suspicious skin lesions, transient blindness, difficulty walking, depression, unusual weight change, abnormal bleeding, enlarged lymph nodes, and angioedema.   Objective:   Physical Exam     WD, WN, 83 y/o WF in NAD... GENERAL:  Alert & oriented; pleasant & cooperative... HEENT:  [place]/AT, Glasses, EOM-wnl, PERRLA, EACs-clear, TMs-wnl, NOSE-clear, THROAT-clear & wnl. NECK:  Supple w/ fair ROM; no JVD; normal carotid impulses w/ faint right Cbruit; no thyromegaly or nodules palpated; no lymphadenopathy. CHEST:  few bibasilar dry rales about 1/3rd way up, no wheezing, no rhonchi, no cough, etc... HEART:  RR rate 60; Gr1/6 SEM without rubs or gallops, no msc heard... ABDOMEN:  Surg scar healed, nontender to palp, normal bowel sounds; no organomegaly or masses detected. EXT:  no deformities, mild arthritic changes; no varicose veins/ +venous insuffic/ 1-2+ edema found on 09/11/15 NEURO:  CN's intact; no focal neuro deficits... DERM:  No lesions noted; no rash etc...  RADIOLOGY DATA:  Reviewed in the EPIC EMR & discussed w/ the patient...  LABORATORY DATA:  Reviewed in the EPIC EMR & discussed w/ the patient...   Assessment & Plan:    ARRHYTHMIA/ LE Edema/ BNP=857=> resolved> ?flutter w/ slow VR, we started Lasix20 & referred to Cards ASAP for further eval=> AFib/Flutter, CHB=> pacer... 6/18>    on Eliquis2.5Bid, has pacer for CHB, cards notes no further AFib on device checks...  PULM>  AB, Pulm Fibrosis>  Overall stable on prn Proair; CXR reviewed & resp exam w/o changes, stable.  HBP>  BP improved w/ low dose Amlodipine 2.5mg /d, continue same & monitor at  home...  MVP>  She is essent asymptomatic w/o CP, palpit, SOB, etc...  R Carotid Bruit> CDoppler w/ 40-59% RICAstenosis, on ASA...  CHOL>  Stable on Lip20 + diet, continue same...  GI> HH, GERD>  Stable on Prilosec, continue same...  RECTAL CANCER>  As above- she is post op 11/12 & had uneventful rehab; slowly improving and diarrhea is diminished w/ Align/ Activia; she will maintain f/u w/ DrByerly & DrGessner ==> f/u colonoscopy 11/13 w/ divertics, no recurrence.  DJD/ Osteopenia>  She remains on Fosamax, Calcium, MVI, Vit D, etc; she will be due for BMD next yr...  UTI>  Prev treated w/ Cipro empirically & currently asymptomatic...  Anxiety>  Stable & she does not require anxiolytic Rx...   Patient's Medications  New Prescriptions   No medications on file  Previous Medications   ALBUTEROL (PROVENTIL HFA;VENTOLIN HFA) 108 (90 BASE) MCG/ACT INHALER    Inhale 2 puffs into the lungs every 6 (six) hours as needed. Wheezing    ALENDRONATE (FOSAMAX) 70 MG TABLET    TAKE 1 TABLET BY  MOUTH ONCE A WEEK WITH A FULL GLASS OF WATER ON AN EMPTY STOMACH   AMLODIPINE (NORVASC) 5 MG TABLET    TAKE 1 TABLET BY MOUTH EVERY DAY   ASCORBIC ACID (VITAMIN C) 250 MG CHEW    Chew 250 mg by mouth daily.    ATORVASTATIN (LIPITOR) 20 MG TABLET    Take 1 tablet (20 mg total) by mouth daily.   CALCIUM CARBONATE-VITAMIN D (CALCIUM-VITAMIN D) 500-200 MG-UNIT PER TABLET    Take 2 tablets by mouth daily.    CHOLECALCIFEROL (VITAMIN D3) 1000 UNITS CAPS    Take 1 capsule by mouth daily.    ELIQUIS 2.5 MG TABS TABLET    TAKE 1 TABLET (2.5 MG TOTAL) BY MOUTH 2 (TWO) TIMES DAILY.   FERROUS SULFATE 325 (65 FE) MG TABLET    Take 325 mg by mouth daily with breakfast.   LOPERAMIDE (LOPERAMIDE A-D) 2 MG TABLET    Take 2 mg by mouth every 6 (six) hours as needed.   OMEPRAZOLE (PRILOSEC) 20 MG CAPSULE    Take 20 mg by mouth daily.    VITAMIN B-12 (CYANOCOBALAMIN) 250 MCG TABLET    Take 250 mcg by mouth daily.    VITAMIN E  (VITAMIN E) 200 UNIT CAPSULE    Take 200 Units by mouth daily.   Modified Medications   Modified Medication Previous Medication   FUROSEMIDE (LASIX) 20 MG TABLET furosemide (LASIX) 20 MG tablet      Take 1 tablet (20 mg total) by mouth daily as needed.    Take 1 tablet (20 mg total) by mouth daily.  Discontinued Medications   No medications on file

## 2016-08-24 NOTE — Patient Instructions (Signed)
Today we updated your med list in our EPIC system...    Continue your current medications the same...  We decided to change the LASIX (Furosemide) to one tab (20mg ) each AM as needed for swelling...  Today we did your follow up CXR & FASTING blood work... Please collect the stool specimens and mail that back to check for hidden blood... We will sched a follow up Bone density Test...    We will contact you w/ the results when available...   Stay as active as possible & be careful- no falling allowed!  Have a good time in [place]!!!  Call for any questions...  Let's plan a follow up visit in 1mo, sooner if needed for problems.Marland KitchenMarland Kitchen

## 2016-08-25 LAB — URINALYSIS, ROUTINE W REFLEX MICROSCOPIC
BILIRUBIN URINE: NEGATIVE
Hgb urine dipstick: NEGATIVE
KETONES UR: NEGATIVE
NITRITE: POSITIVE — AB
Specific Gravity, Urine: 1.025 (ref 1.000–1.030)
Total Protein, Urine: 30 — AB
Urine Glucose: NEGATIVE
Urobilinogen, UA: 0.2 (ref 0.0–1.0)
pH: 5.5 (ref 5.0–8.0)

## 2016-08-25 LAB — CEA: CEA: 4.6 ng/mL — AB

## 2016-08-27 LAB — VITAMIN D 1,25 DIHYDROXY
VITAMIN D 1, 25 (OH) TOTAL: 55 pg/mL (ref 18–72)
Vitamin D3 1, 25 (OH)2: 55 pg/mL

## 2016-08-30 ENCOUNTER — Other Ambulatory Visit: Payer: Self-pay | Admitting: Pulmonary Disease

## 2016-08-30 DIAGNOSIS — I6523 Occlusion and stenosis of bilateral carotid arteries: Secondary | ICD-10-CM

## 2016-08-30 MED ORDER — CIPROFLOXACIN HCL 250 MG PO TABS: 250.0000 mg | ORAL_TABLET | Freq: Two times a day (BID) | ORAL | 0 refills | Status: DC

## 2016-08-30 NOTE — Progress Notes (Signed)
Spoke with pt and notified of results per Dr.Nadel. Pt verbalized understanding and denied any questions. Rx sent to pharm

## 2016-08-30 NOTE — Progress Notes (Signed)
Spoke with pt and notified of results per Dr. Nadel. Pt verbalized understanding and denied any questions. 

## 2016-09-01 ENCOUNTER — Ambulatory Visit (INDEPENDENT_AMBULATORY_CARE_PROVIDER_SITE_OTHER)
Admission: RE | Admit: 2016-09-01 | Discharge: 2016-09-01 | Disposition: A | Discharge status: Home / Self Care | Payer: Medicare Other | Source: Ambulatory Visit | Attending: Pulmonary Disease | Admitting: Pulmonary Disease

## 2016-09-01 DIAGNOSIS — M949 Disorder of cartilage, unspecified: Secondary | ICD-10-CM | POA: Diagnosis not present

## 2016-09-01 DIAGNOSIS — M899 Disorder of bone, unspecified: Secondary | ICD-10-CM | POA: Diagnosis not present

## 2016-09-03 ENCOUNTER — Other Ambulatory Visit (INDEPENDENT_AMBULATORY_CARE_PROVIDER_SITE_OTHER): Payer: Medicare Other

## 2016-09-03 ENCOUNTER — Other Ambulatory Visit: Payer: Self-pay | Admitting: Pulmonary Disease

## 2016-09-03 DIAGNOSIS — K5730 Diverticulosis of large intestine without perforation or abscess without bleeding: Secondary | ICD-10-CM

## 2016-09-03 LAB — FECAL OCCULT BLOOD, IMMUNOCHEMICAL: Fecal Occult Bld: NEGATIVE

## 2016-09-07 ENCOUNTER — Telehealth: Payer: Self-pay | Admitting: Pulmonary Disease

## 2016-09-07 NOTE — Telephone Encounter (Signed)
Pt is calling and wanting to know if the cipro was to only be for 7 days or did she need to take this medication for a longer period of time.  SN please advise. Thanks

## 2016-09-07 NOTE — Telephone Encounter (Signed)
Per SN---  Yes 1 po bid x 7 days and then she is done.    I called and spoke with the pt and she stated that tomorrow will be her last day of the abx.  She will go Thursday for the carotid study.  Nothing further is needed.

## 2016-09-09 ENCOUNTER — Ambulatory Visit (HOSPITAL_COMMUNITY)
Admission: RE | Admit: 2016-09-09 | Discharge: 2016-09-09 | Disposition: A | Discharge status: Home / Self Care | Payer: Medicare Other | Source: Ambulatory Visit | Attending: Cardiology | Admitting: Cardiology

## 2016-09-09 DIAGNOSIS — I6523 Occlusion and stenosis of bilateral carotid arteries: Secondary | ICD-10-CM | POA: Insufficient documentation

## 2016-09-09 DIAGNOSIS — I10 Essential (primary) hypertension: Secondary | ICD-10-CM | POA: Diagnosis not present

## 2016-09-09 DIAGNOSIS — E785 Hyperlipidemia, unspecified: Secondary | ICD-10-CM | POA: Diagnosis not present

## 2016-11-10 ENCOUNTER — Encounter: Payer: Self-pay | Admitting: Cardiology

## 2016-11-15 ENCOUNTER — Ambulatory Visit (INDEPENDENT_AMBULATORY_CARE_PROVIDER_SITE_OTHER): Payer: Medicare Other | Admitting: *Deleted

## 2016-11-15 ENCOUNTER — Encounter: Payer: Self-pay | Admitting: Cardiology

## 2016-11-15 ENCOUNTER — Ambulatory Visit (INDEPENDENT_AMBULATORY_CARE_PROVIDER_SITE_OTHER): Payer: Medicare Other | Admitting: Cardiology

## 2016-11-15 VITALS — BP 122/64 | HR 60 | Ht 67.0 in | Wt 125.6 lb

## 2016-11-15 DIAGNOSIS — I442 Atrioventricular block, complete: Secondary | ICD-10-CM

## 2016-11-15 DIAGNOSIS — I6523 Occlusion and stenosis of bilateral carotid arteries: Secondary | ICD-10-CM

## 2016-11-15 DIAGNOSIS — I10 Essential (primary) hypertension: Secondary | ICD-10-CM | POA: Diagnosis not present

## 2016-11-15 DIAGNOSIS — I481 Persistent atrial fibrillation: Secondary | ICD-10-CM

## 2016-11-15 DIAGNOSIS — I4819 Other persistent atrial fibrillation: Secondary | ICD-10-CM

## 2016-11-15 LAB — CUP PACEART INCLINIC DEVICE CHECK
Date Time Interrogation Session: 20180827121456
Implantable Lead Implant Date: 20170727
Implantable Pulse Generator Implant Date: 20170727
Lead Channel Impedance Value: 587.5 Ohm
Lead Channel Pacing Threshold Amplitude: 0.5 V
Lead Channel Pacing Threshold Amplitude: 0.5 V
Lead Channel Pacing Threshold Pulse Width: 0.4 ms
Lead Channel Sensing Intrinsic Amplitude: 12 mV
Lead Channel Setting Sensing Sensitivity: 2 mV
MDC IDC LEAD LOCATION: 753860
MDC IDC MSMT BATTERY REMAINING LONGEVITY: 115 mo
MDC IDC MSMT BATTERY VOLTAGE: 3.02 V
MDC IDC MSMT LEADCHNL RV PACING THRESHOLD PULSEWIDTH: 0.4 ms
MDC IDC SET LEADCHNL RV PACING AMPLITUDE: 2.5 V
MDC IDC SET LEADCHNL RV PACING PULSEWIDTH: 0.4 ms
MDC IDC STAT BRADY RV PERCENT PACED: 99.04 %
Pulse Gen Model: 1272
Pulse Gen Serial Number: 7897007

## 2016-11-15 NOTE — Progress Notes (Signed)
Electrophysiology Office Note   Date:  11/15/2016   ID:  Hannah Mercado, Hannah Mercado [birth year], MRN [id number]  PCP:  Noralee Space, MD  Cardiologist:  Harrington Challenger Primary Electrophysiologist:  Willian Donson Meredith Leeds, MD    Chief Complaint  Patient presents with  . Pacemaker Check    Complete heart block/PAF     History of Present Illness: Hannah Mercado is a 83 y.o. female who presents today for electrophysiology evaluation.   History of AF/flutter, HLD.  Was seen in cardiology clinic 09/12/15 with AF and a HR of 39.  Pacemaker placed 10/16/15. Since that time she has felt well without complaint.  She continues to be able to do all of her daily activities.  She is excited about the holidays and is planning on traveling to [place] for Christmas.  Today, denies symptoms of palpitations, chest pain, shortness of breath, orthopnea, PND, lower extremity edema, claudication, dizziness, presyncope, syncope, bleeding, or neurologic sequela. The patient is tolerating medications without difficulties and is otherwise without complaint today.    Past Medical History:  Diagnosis Date  . Allergy history unknown   . Anxiety   . Asthmatic bronchitis    use inhaler prn   . Blood transfusion    ? with ectopic pregnancy 50 yrs ago   . DJD (degenerative joint disease)   . GERD (gastroesophageal reflux disease)   . Hiatal hernia   . History of pneumonia    right middle lobe  . Hypercholesteremia   . Mitral valve prolapse    pt denies   . Osteopenia   . Pulmonary fibrosis, postinflammatory ([place])    pt denies   . Rectal cancer [facility])    Past Surgical History:  Procedure Laterality Date  . APPENDECTOMY    . COLON RESECTION  02/08/2011   Procedure: LAPAROSCOPIC SIGMOID COLON RESECTION;  Surgeon: Stark Klein, MD;  Location: [facility];  Service: General;  Laterality: N/A;  Laparoscopic Lower Anterior Bowel Resection  . COLON SURGERY    . COLONOSCOPY  11/27/10   rectal cancer, small sigmoid adenoma,  diverticulosis  . ECTOPIC PREGNANCY SURGERY     Midline incision  . EP IMPLANTABLE DEVICE N/A 10/16/2015   Procedure: Pacemaker Implant;  Surgeon: Kamber Vignola Meredith Leeds, MD;  Location: [facility];  Service: Cardiovascular;  Laterality: N/A;  . HAMMER TOE SURGERY  1995   Dr. Silverio Decamp  . Laparoscopic Left oophorectomy, laparoscopic low anterior resection, placement of OnQ pain pump  02/08/2011  . NASAL SINUS SURGERY  1994  . OVARIAN CYST REMOVAL  02/08/2011   Procedure: OVARIAN CYSTECTOMY;  Surgeon: Stark Klein, MD;  Location: [facility];  Service: General;  Laterality: Left;  Removal of Left Ovary and Pelvic Mass  . VAGINAL HYSTERECTOMY  1980's     Current Outpatient Prescriptions  Medication Sig Dispense Refill  . albuterol (PROVENTIL HFA;VENTOLIN HFA) 108 (90 BASE) MCG/ACT inhaler Inhale 2 puffs into the lungs every 6 (six) hours as needed. Wheezing     . alendronate (FOSAMAX) 70 MG tablet TAKE 1 TABLET BY MOUTH ONCE A WEEK WITH A FULL GLASS OF WATER ON AN EMPTY STOMACH 12 tablet 3  . amLODipine (NORVASC) 5 MG tablet TAKE 1 TABLET BY MOUTH EVERY DAY 30 tablet 7  . ascorbic acid (VITAMIN C) 250 MG CHEW Chew 250 mg by mouth daily.     Marland Kitchen atorvastatin (LIPITOR) 20 MG tablet Take 1 tablet (20 mg total) by mouth daily. 90 tablet 3  . Calcium Carbonate-Vitamin D (  CALCIUM-VITAMIN D) 500-200 MG-UNIT per tablet Take 2 tablets by mouth daily.     . Cholecalciferol (VITAMIN D3) 1000 UNITS CAPS Take 1 capsule by mouth daily.     . ciprofloxacin (CIPRO) 250 MG tablet Take 1 tablet (250 mg total) by mouth 2 (two) times daily. 14 tablet 0  . ELIQUIS 2.5 MG TABS tablet TAKE 1 TABLET (2.5 MG TOTAL) BY MOUTH 2 (TWO) TIMES DAILY. 180 tablet 1  . ferrous sulfate 325 (65 FE) MG tablet Take 325 mg by mouth daily with breakfast.    . furosemide (LASIX) 20 MG tablet Take 1 tablet (20 mg total) by mouth daily as needed. 30 tablet 11  . loperamide (LOPERAMIDE A-D) 2 MG tablet Take 2 mg by mouth every 6  (six) hours as needed.    Marland Kitchen omeprazole (PRILOSEC) 20 MG capsule Take 20 mg by mouth daily.     . vitamin B-12 (CYANOCOBALAMIN) 250 MCG tablet Take 250 mcg by mouth daily.     . vitamin E (VITAMIN E) 200 UNIT capsule Take 200 Units by mouth daily.      No current facility-administered medications for this visit.    Facility-Administered Medications Ordered in Other Visits  Medication Dose Route Frequency Provider Last Rate Last Dose  . ondansetron (ZOFRAN) injection    PRN Ofilia Neas, CRNA   1 mg at 02/08/11 9509    Allergies:   Sulfonamide derivatives   Social History:  The patient  reports that she has never smoked. She has never used smokeless tobacco. She reports that she does not drink alcohol or use drugs.   Family History:  The patient's family history includes Heart disease in her father; Lung cancer in her brother.    ROS:  Please see the history of present illness.   Otherwise, review of systems is positive for diarrhea.   All other systems are reviewed and negative.   PHYSICAL EXAM: VS:  BP 122/64   Pulse 60   Ht 5\' 7"  (1.702 m)   Wt 125 lb 9.6 oz (57 kg)   BMI 19.67 kg/m  , BMI Body mass index is 19.67 kg/m. GEN: Well nourished, well developed, in no acute distress  HEENT: normal  Neck: no JVD, carotid bruits, or masses Cardiac: RRR; no murmurs, rubs, or gallops,no edema  Respiratory:  clear to auscultation bilaterally, normal work of breathing GI: soft, nontender, nondistended, + BS MS: no deformity or atrophy  Skin: warm and dry, device site well healed Neuro:  Strength and sensation are intact Psych: euthymic mood, full affect  EKG:  EKG is ordered today. Personal review of the ekg ordered shows V pacing  Personal review of the device interrogation today. Results in [place]: 08/24/2016: ALT 8; BUN 30; Creatinine, Ser 1.42; Hemoglobin 13.5; Platelets 259.0; Potassium 5.1; Sodium 141; TSH 2.61    Lipid Panel     Component Value Date/Time     CHOL 183 08/24/2016 1020   TRIG 101.0 08/24/2016 1020   HDL 83.10 08/24/2016 1020   CHOLHDL 2 08/24/2016 1020   VLDL 20.2 08/24/2016 1020   LDLCALC 80 08/24/2016 1020     Wt Readings from Last 3 Encounters:  11/15/16 125 lb 9.6 oz (57 kg)  08/24/16 124 lb (56.2 kg)  02/24/16 121 lb 8 oz (55.1 kg)      Other studies Reviewed: Additional studies/ records that were reviewed today include: TTE 09/16/15  Review of the above records today demonstrates:  - Left ventricle:  The cavity size was normal. There was mild  concentric hypertrophy. Systolic function was normal. The  estimated ejection fraction was in the range of 55% to 60%. Wall  motion was normal; there were no regional wall motion  abnormalities. The study is not technically sufficient to allow  evaluation of LV diastolic function. - Aortic valve: Trileaflet; mildly thickened, mildly calcified  leaflets. There was mild regurgitation. - Mitral valve: Calcified annulus. Moderate prolapse, involving the  anterior leaflet. There was moderate regurgitation directed  posteriorly. - Left atrium: The atrium was severely dilated. - Right atrium: The atrium was severely dilated. - Tricuspid valve: There was moderate regurgitation. - Pulmonary arteries: Systolic pressure was severely increased. PA  peak pressure: 80 mm Hg (S).   ASSESSMENT AND PLAN:  1.  Permanent Atrial fibrillation:On Eliquis. Early well rate controlled. No plans for cardioversion. Continue current management.   This patients CHA2DS2-VASc Score and unadjusted Ischemic Stroke Rate (% per year) is equal to 4.8 % stroke rate/year from a score of 4  Above score calculated as 1 point each if present [CHF, HTN, DM, Vascular=MI/PAD/Aortic Plaque, Age if 65-74, or Female] Above score calculated as 2 points each if present [Age > 75, or Stroke/TIA/TE]   2. Complete heart block: pacemaker placed 10/16/15. Device functioning appropriately. No changes at this  time.  3. Hypertension: Well-controlled today. She is having issues with early morning fatigue. We'll give her a try with holding amlodipine in the mornings to see if this Zaray Gatchel help.   Current medicines are reviewed at length with the patient today.   The patient does not have concerns regarding her medicines.  The following changes were made today:  Hold Norvasc  Labs/ tests ordered today include:  Orders Placed This Encounter  Procedures  . CUP PACEART [place]  . EKG 12-Lead     Disposition:   FU with Skylar Priest 12 months  Signed, Pacer Dorn Meredith Leeds, MD  11/15/2016 12:24 PM     [facility] [address] [place] [address] [phone] (office) [phone] (fax)

## 2016-11-15 NOTE — Progress Notes (Signed)
Remote pacemaker transmission.   

## 2016-11-15 NOTE — Patient Instructions (Addendum)
Medication Instructions:  Your physician recommends that you continue on your current medications as directed. Please refer to the Current Medication list given to you today.  --- If you need a refill on your cardiac medications before your next appointment, please call your pharmacy. ---  Labwork: None ordered  Testing/Procedures: None ordered  Follow-Up: Remote monitoring is used to monitor your Pacemaker of ICD from home. This monitoring reduces the number of office visits required to check your device to one time per year. It allows [place] to keep an eye on the functioning of your device to ensure it is working properly. You are scheduled for a device check from home on 02/14/2017. You may send your transmission at any time that day. If you have a wireless device, the transmission will be sent automatically. After your physician reviews your transmission, you will receive a postcard with your next transmission date.  Your physician wants you to follow-up in: 1 year with Dr. Curt Bears.  You will receive a reminder letter in the mail two months in advance. If you don't receive a letter, please call our office to schedule the follow-up appointment.  Any Other Special Instructions Will Be Listed Below (If Applicable).  Thank you for choosing [facility]!!   Trinidad Curet, RN [phone]

## 2016-11-26 ENCOUNTER — Encounter: Payer: Self-pay | Admitting: Cardiology

## 2016-11-26 NOTE — Progress Notes (Signed)
Letter  

## 2016-12-03 LAB — CUP PACEART REMOTE DEVICE CHECK
Implantable Lead Location: 753860
Implantable Pulse Generator Implant Date: 20170727
MDC IDC LEAD IMPLANT DT: 20170727
MDC IDC SESS DTM: 20180914092245
Pulse Gen Model: 1272
Pulse Gen Serial Number: 7897007

## 2016-12-28 DIAGNOSIS — Z23 Encounter for immunization: Secondary | ICD-10-CM | POA: Diagnosis not present

## 2017-01-06 ENCOUNTER — Other Ambulatory Visit: Payer: Self-pay | Admitting: Cardiology

## 2017-01-09 ENCOUNTER — Other Ambulatory Visit: Payer: Self-pay | Admitting: Cardiology

## 2017-01-10 NOTE — Telephone Encounter (Signed)
Pt last seen by Dr Curt Bears on 11/15/16, last labs 08/24/16 Creat 1.42, age 83, weight 57kg, based on specified criteria pt is on appropriate dosage of Eliquis 2.5mg  BID.  Will refill rx.

## 2017-02-14 ENCOUNTER — Ambulatory Visit (INDEPENDENT_AMBULATORY_CARE_PROVIDER_SITE_OTHER): Payer: Medicare Other | Admitting: *Deleted

## 2017-02-14 DIAGNOSIS — I481 Persistent atrial fibrillation: Secondary | ICD-10-CM | POA: Diagnosis not present

## 2017-02-14 DIAGNOSIS — I4819 Other persistent atrial fibrillation: Secondary | ICD-10-CM

## 2017-02-15 NOTE — Progress Notes (Signed)
Remote pacemaker transmission.   

## 2017-02-18 ENCOUNTER — Encounter: Payer: Self-pay | Admitting: Cardiology

## 2017-02-23 ENCOUNTER — Encounter: Payer: Self-pay | Admitting: Pulmonary Disease

## 2017-02-23 ENCOUNTER — Other Ambulatory Visit (INDEPENDENT_AMBULATORY_CARE_PROVIDER_SITE_OTHER): Payer: Medicare Other

## 2017-02-23 ENCOUNTER — Ambulatory Visit (INDEPENDENT_AMBULATORY_CARE_PROVIDER_SITE_OTHER): Payer: Medicare Other | Admitting: Pulmonary Disease

## 2017-02-23 VITALS — BP 130/72 | HR 62 | Temp 98.2°F | Ht 67.0 in | Wt 122.0 lb

## 2017-02-23 DIAGNOSIS — Z950 Presence of cardiac pacemaker: Secondary | ICD-10-CM

## 2017-02-23 DIAGNOSIS — M159 Polyosteoarthritis, unspecified: Secondary | ICD-10-CM

## 2017-02-23 DIAGNOSIS — M150 Primary generalized (osteo)arthritis: Secondary | ICD-10-CM

## 2017-02-23 DIAGNOSIS — I10 Essential (primary) hypertension: Secondary | ICD-10-CM | POA: Diagnosis not present

## 2017-02-23 DIAGNOSIS — M899 Disorder of bone, unspecified: Secondary | ICD-10-CM | POA: Diagnosis not present

## 2017-02-23 DIAGNOSIS — I4819 Other persistent atrial fibrillation: Secondary | ICD-10-CM

## 2017-02-23 DIAGNOSIS — Z789 Other specified health status: Secondary | ICD-10-CM

## 2017-02-23 DIAGNOSIS — C20 Malignant neoplasm of rectum: Secondary | ICD-10-CM | POA: Diagnosis not present

## 2017-02-23 DIAGNOSIS — I059 Rheumatic mitral valve disease, unspecified: Secondary | ICD-10-CM

## 2017-02-23 DIAGNOSIS — C189 Malignant neoplasm of colon, unspecified: Secondary | ICD-10-CM | POA: Diagnosis not present

## 2017-02-23 DIAGNOSIS — I481 Persistent atrial fibrillation: Secondary | ICD-10-CM | POA: Diagnosis not present

## 2017-02-23 DIAGNOSIS — M949 Disorder of cartilage, unspecified: Secondary | ICD-10-CM

## 2017-02-23 DIAGNOSIS — K219 Gastro-esophageal reflux disease without esophagitis: Secondary | ICD-10-CM | POA: Diagnosis not present

## 2017-02-23 DIAGNOSIS — I6523 Occlusion and stenosis of bilateral carotid arteries: Secondary | ICD-10-CM

## 2017-02-23 DIAGNOSIS — E7800 Pure hypercholesterolemia, unspecified: Secondary | ICD-10-CM

## 2017-02-23 DIAGNOSIS — Z0000 Encounter for general adult medical examination without abnormal findings: Secondary | ICD-10-CM

## 2017-02-23 DIAGNOSIS — F411 Generalized anxiety disorder: Secondary | ICD-10-CM

## 2017-02-23 DIAGNOSIS — J8410 Pulmonary fibrosis, unspecified: Secondary | ICD-10-CM | POA: Diagnosis not present

## 2017-02-23 DIAGNOSIS — K5730 Diverticulosis of large intestine without perforation or abscess without bleeding: Secondary | ICD-10-CM

## 2017-02-23 LAB — BASIC METABOLIC PANEL
BUN: 19 mg/dL (ref 6–23)
CALCIUM: 9.5 mg/dL (ref 8.4–10.5)
CO2: 26 mEq/L (ref 19–32)
CREATININE: 0.91 mg/dL (ref 0.40–1.20)
Chloride: 105 mEq/L (ref 96–112)
GFR: 61.4 mL/min (ref >60.00)
Glucose, Bld: 109 mg/dL — ABNORMAL HIGH (ref 70–99)
Potassium: 5 mEq/L (ref 3.5–5.1)
Sodium: 140 mEq/L (ref 135–145)

## 2017-02-23 NOTE — Patient Instructions (Signed)
Today we updated your med list in our EPIC system...    Continue your current medications the same...  Today we rechecked your blood work...    We will contact you w/ the results when available...   Stay as active as [poss & be careful-- no falling allowed...  Call for any questions...  Let's plan a follow up visit in 14mo, sooner if needed for problems...  PS>>  I found your 6th christmas tree:

## 2017-02-23 NOTE — Progress Notes (Signed)
Subjective:    Patient ID: Hannah Mercado, female    DOB: [birth year], 83 y.o.   MRN: [id number]  HPI 83 y/o WF here for a follow up visit... ~  SEE PREV EPIC NOTES FOR OLDER DATA >>     2DEcho 2/04 showing MVP, mild MR, normal LVF... StressEcho w/ EF=60%... Cardiolite 12/03 was normal w/o ischemia and EF=65%.  Baseline EKG 02/08/11>  NSR, rate60, 1st degree AVB w/ PR=.22  CXR 11/13 showed borderline cardiomeg, ectasia of the Ao, chronic lung changes/ NAD, DJD/ spondylosis...  LABS 11/13:  Chems- wnl;  CBC- wnl;  UA- ok...   LABS 5/14:  FLP- at goals on Lip20;  Chems- wnl;  CBC- wnl;  TSH=2.61;  VitD=56;  UA- few wbc, no bact...  CXR => done 6/15>  Borderline cardiomeg, prominent PAs, calcif Ao arch, COPD w/ sl incr markings/ NAD, DJD in Tspine...  LABS 5/15:  FLP- within parameters on Lip20;  Chems- wnl;  CBC- ok w/ Hg=12.6;  TSH=2.41;  VitD=55 on 1000u daily...  BMD => done 6/15>  Lowest Tscore is -2.6 in Lumbar spine; she is rec to continue ALENDRONATE 70mg /wk & calcium/ MVI/ VitD supplements...   ~  August 28, 2014:  25mo ROV & Hannah Mercado will soon be 62!  Planning a trip to [place] to visit daugh in Aug;  She does crosswords everyday!  She reports doing satis, no new complaints or concerns- taking nutritional supplements & wt is up a few lbs...    Hx AB, pulm fibrosis> on ProventilHFA prn (rarely uses); no resp symptoms & breathing good, & she denies cough, phlegm, SOB, etc; CXR w/o acute changes...    MVP, HBP> on ASA81 & Amlod2.5; BP= 136/80 & similar at home; stable w/ msc & gr1/6 SEM; no CP, palpit, ch in SOB, edema, etc...    Right carotid bruit> no cerebral ischemic symptoms; CDoppler 6/16 showed 40-59% RICA stensis, Rx ASA...    Chol> remains on Lip20 & FLP 6/16 looks great w/ TChol 187, TG 83, HDL 93, LDL 78    HH, GERD> on Prilosec20mg /d + antireflux regimen in view of her HH etc; she denies abd pain, n/v, notes intermit diarrhea, no blood seen...    Hx rectal cancer> s/p  lap low anterior resection 11/12 by DrByerly; f/u colonoscopy 11/13 by DrGessner- mod divertics, anastomosis ok; she sees DrByerly Q6-50mo- notes reviewed still w/ occas loose stool (uses Immodium prn); on Align & Activia; she's avoiding lactose & gas is improved, improved after nutrition consult;  CEA level 2/15 = 3.2 (stable); CEA 2/16 by DrByerly & we don't have report.    DJD, Osteopenia> on Fosamax70/wk along w/ Ca, MVI, VitD; last BMD 6/15 w/ lowest Tscore-2.6 (continue same rx), she is getting along well but hasn't played much golf since her surg...    Other problems as noted... She likes to take a number of supplements... We reviewed prob list, meds, xrays and labs> see below for updates >>   CXR 6/16 showed borderline heart size, hyperinflated lungs, icr markings bilat, NAD/ no change...  LABS 6/16:  FLP- at goals on Atorva20;  Chems- wnl;  CBC- wnl;  TSH=3.86;  UA- clear... NOTE> CEA done 2/16 by DrByerly & we do not have results.  CDopplers 6/16 showed irreg mixed plaque bilat, 40-59% RICAstenosis & 2-29% LICAstenosis, norm subclav & antegrade vertebrals..The current medical regimen is effective;  continue present plan and medications. ASA daily.  ~  March 12, 2015:  48mo ROV & Hannah Mercado turned  89 this past summer- surprise party by her 2 children w/ 40 people & she had a blast; she is going to family on [place] for 44mo over the holidays; clinically doing very well- feels good, still does crosswords daily, no new complaints or concerns... She is reminded to drink 1-2 can Ensure/ Sustacal/ Boost daily to help her gain wt...     She has a ProairHFA inhaler but hasn't needed it in yrs she says; breathing stable w/ her chr lung dis- Hx AB & pulm fibrosis, she denies much cough/ sput/ CP/ SOB...     BP & MVP remain stable on low dose Amlodipine 2.5mg /d; BP= 128/80 & she remains relatively asymptomatic; we reviewed exercise program...     Chol is treated w/ Lip20 & last FLP 6/16 looked good w/ TChol  187, TG 83, HDL 93, LDL 78; she has lost wt down to 123# w/ BMI=20 5 encouraged to eat more + supplement...    Hx adenoca of rectum, s/p surg 11/12- followed by DrByerly & DrGessner, stable & no known recurrence...    Hx DJD, osteopenia on Fosamax, VitD, MVI, & OTC analgesics as needed...    Nocturnal leg cramps> she thinks neuropathy but tonic water & prev Quinine rx helped...  We reviewed prob list, meds, xrays and labs>  IMP/PLAN>>  Hannah Mercado is doing remarkably well at age 50;  We discussed continuing the same meds, and incr nutrtitional supplements;  We will plan ROV w/ CXR & Labs in 7mo...  ~  September 11, 2015:  31mo ROV & pulmonary/ medical follow up visit>  Hannah Mercado reports a good 77mo interval but notes LE edema x 3-4wks;  She has gained 9# from her baseline 7mo ago (123#) to 132# today assoc w/ 2+pitting edema in both legs which is new; she denies dietary sodium excess or prev hx of edema=> see below as we review the following medical problems during today's office visit >>     Hx AB, pulm fibrosis> on ProventilHFA prn (rarely uses); no resp symptoms & breathing good, & she denies cough, phlegm, SOB, etc; CXR today shows cardiomeg & interstitial edema...    MVP, HBP> on ASA81 & Amlod2.5; BP= 138/70 but pulse is slow today w/ HR ~40 & she denies CP, palpit, dizzy/ syncope/ near-syncope, change in DOE etc...    Cardiac arrhythmia> new finding on today's EKG=> ?Flutter w/ slow VR, rate40; she has the new edema, interstitial changes on CXR & BNP=857 => referred to [facility] ASAP for eval...    Right carotid bruit> no cerebral ischemic symptoms; CDoppler 6/17 shows no change in 40-59% RICA stenosis, Rx ASA...    Chol> remains on Lip20 & FLP 6/17 looks great w/ TChol 148, TG 75, HDL 72, LDL 61    HH, GERD> on Prilosec20mg /d + antireflux regimen in view of her HH etc; she denies abd pain, n/v, notes intermit diarrhea, no blood seen...    Hx rectal cancer> s/p lap low anterior resection 11/12 by DrByerly; f/u  colonoscopy 11/13 by DrGessner- mod divertics, anastomosis ok; she sees DrByerly Q30mo- notes reviewed still w/ occas loose stool (uses Immodium prn); on Align & Activia; she's avoiding lactose & gas is improved, improved after nutrition consult;  CEA level 2/15 = 3.2 (stable); CEA 2/16 & 2/17 by DrByerly & we don't have report.    DJD, Osteopenia> on Fosamax70/wk along w/ Ca, MVI, VitD; last BMD 6/15 w/ lowest Tscore-2.6 (continue same rx), she is getting along well but hasn't played much  golf since her surg...    Other problems as noted... She likes to take a number of supplements... EXAM shows Afeb, VSS x pulse=40; O2sat=96% on RA; Wt=132#, BMI=15;  HEENT- neg, mallampati1, R carotid bruit;  Chest- decr BS w/ few scat rhonchi, bibasilar rales- unchanged;  Heart- brady rate ~40, gr 1-2/6 SEM w/o r/g;  Abd- thin soft, neg;  Ext- VI, 2+edema in LEs w/o c/c;  Neuro- intact...  CDoppler 09/04/15>  Heterogeneous plaque bilat, stable 40-595 RICA stenosis & 6-96% LICA stenosis, subclav & vertebrals ok...  CXR 09/11/15>  Mod cardiomegaly, incr interstitial markings suggesting poss edema, increased markings at bases...  EKG 09/11/15>  Bradycardia, rate ~40, ?AFlutter, poor R progression V1-2...  LABS 09/11/15>  FLP- all parameters at goals on Lip20;  Chems- wnl w/ Cr=0.94, BS=112, BNP=857;  CBC- wnl;  TSH=4.80;  VitD=60;  UA- clear...  IMP/PLAN>>  While still essentially asymptomatic at age 61, Hannah Mercado has developed 2+pitting edema in LEs over the last 3weeks or so & has a bradycardia on EKG w/ ?Flutter & slow VR, only on Amlod2.5, BNP is elev at 857=> we will start Lasix20 & refer for Cards eval ASAP.    ~  February 24, 2016:  6 month [place] had Pacer placed in July- [place] for [facility]> most recent f/u 01/2016-- hx AFib/Flutter, low HR from CHB=> pacer placed 10/16/15, doing satis, planning trip to [place] for [place]... On Eliquis, no further AFib on device check... We reviewed the following medical  problems during today's office visit >>     Hx AB, pulm fibrosis> on ProventilHFA prn (rarely uses); no resp symptoms & breathing good, & she denies cough, phlegm, SOB, etc; CXR today shows cardiomeg & interstitial edema...    MVP, HBP> on Amlod2.5 & Lasix20; BP= 124/70 but pulse is slow today w/ HR ~40 & she denies CP, palpit, dizzy/ syncope/ near-syncope, change in DOE etc...    Cardiac arrhythmia=> Fib/Flutter & CHB=>Pacer> new finding 08/2015=> ?Flutter w/ slow VR, rate40; she has the new edema, interstitial changes on CXR & BNP=857 => referred to [facility]    Right carotid bruit> no cerebral ischemic symptoms; CDoppler 6/17 shows no change in 40-59% RICA stenosis, Rx ASA...    Chol> remains on Lip20 & FLP 6/17 looks great w/ TChol 148, TG 75, HDL 72, LDL 61    HH, GERD> on Prilosec20mg /d + antireflux regimen in view of her HH etc; she denies abd pain, n/v, notes intermit diarrhea, no blood seen...    Hx rectal cancer> s/p lap low anterior resection 11/12 by DrByerly; f/u colonoscopy 11/13 by DrGessner- mod divertics, anastomosis ok; she sees DrByerly Q30mo- notes reviewed still w/ occas loose stool (uses Immodium prn); on Align & Activia; she's avoiding lactose & gas is improved, improved after nutrition consult;  CEA level 2/15 = 3.2 (stable); CEA 2/16 & 2/17 by DrByerly & we don't have report.    DJD, Osteopenia> on Fosamax70/wk along w/ Ca, MVI, VitD; last BMD 6/15 w/ lowest Tscore-2.6 (continue same rx), she is getting along well but hasn't played much golf since her surg...    Other problems as noted... She likes to take a number of supplements... EXAM shows Afeb, VSS; O2sat=97% on RA; Wt=122#, BMI=15;  HEENT- neg, mallampati1, R carotid bruit;  Chest- decr BS w/ few scat rhonchi, bibasilar rales- unchanged;  Heart- RR rate70, gr 1-2/6 SEM w/o r/g;  Abd- thin soft, neg;  Ext- VI, +tr edema in LEs w/o c/c;  Neuro- intact... IMP/PLAN>>  Hannah Mercado is stable & looking forward to Xmas in [place] w/  family; continue same meds and follow up w/ [place] & [facility]...  ~  August 24, 2016:  4mo ROV & Hannah Mercado reports a good interval- she notes sl wobbly on occas & wonders about her Lasix but BP is good & edema resolved;  She reports that she's going to daugh's in [place] for 11mo soon... we reviewed the following medical problems during today's office visit >>     Hx AB, pulm fibrosis> on ProventilHFA prn (rarely uses); no resp symptoms & breathing good, & she denies cough, phlegm, SOB, etc; CXR today shows cardiomeg & interstitial edema...    MVP, HBP> on Amlod5 & Lasix20; BP= 124/70 but pulse is slow today w/ HR ~40 & she denies CP, palpit, dizzy/ syncope/ near-syncope, change in DOE etc...    Cardiac arrhythmia=> Fib/Flutter & CHB=>Pacer> new finding 08/2015=> hx AFib/Flutter, low HR from CHB=> pacer placed 10/16/15, on Eliquis2.5Bid, no further AFib on device checks...    Right carotid bruit> on ASA81; no cerebral ischemic symptoms; CDoppler 6/17 shows no change in 40-59% RICA stenosis, but f/u CDopplers 6/18 showed bilat 1-39% ICAstenoses, continue same...    Chol> remains on Lip20 & FLP 6/18 looks great w/ TChol 183, TG 101, HDL 83, LDL 80... continue same.    HH, GERD> on Prilosec20mg /d + antireflux regimen in view of her [place] etc; she denies abd pain, n/v, notes intermit diarrhea, no blood seen...    Hx rectal cancer> s/p lap low anterior resection 11/12 by DrByerly; f/u colonoscopy 11/13 by DrGessner- mod divertics, anastomosis ok; she sees DrByerly Q64mo- notes reviewed still w/ occas loose stool (uses Immodium prn); on Align & Activia; she's avoiding lactose & gas is improved, improved after nutrition consult;  CEA levels have been in the 3's but f/u 08/24/16= 4.6 & we will recheck in 22mo.    Mild renal insuffic>  Prev renal function wnl (0.94-1.08) and labs 6/18 showed Cr=1.42 on Lasix20/d... OK to change to "prn edema" & reminded to incr water intake.    DJD, Osteopenia> on Fosamax70/wk along w/ Ca, MVI, VitD; BMD  recheck 6/18 showed lowest Tscore-2.5 & she is in need of a drug holiday starting now- she is getting along well but hasn't played much golf since her surg...    Other problems as noted... She likes to take a number of supplements... EXAM shows Afeb, VSS; O2sat=98% on RA; Wt=124#, BMI=15;  HEENT- neg, mallampati1, R carotid bruit;  Chest- decr BS w/ few scat rhonchi, bibasilar rales- unchanged;  Heart- RR rate70, gr 1-2/6 SEM w/o r/g;  Abd- thin soft, neg;  Ext- VI, +tr edema in LEs w/o c/c;  Neuro- intact...  CXR 08/24/16>  Cardiomeg, pacer, overinflation/ COPD, bibasilar scarring, NAD...   LABS 08/24/16>  FLP- at goals on Atorva20;  Chems- ok x BS=117, Cr=1.42;  CBC- wnl w/ Hg=13.5;  Stool neg;  TSH=2.61;  CEA=4.6 (up from 3.2);  VitD=55;  UA shows UTI w/ +leukocytes/ nitrite pos/ +bact=> Rx w/ CIPRO  Bone Density 09/01/16>  Lowest Tscore is -2.5 in Lumbar spine, and -2.4 in RFN... She has been on Bisphos Rx for yrs & in need of a drug holiday-- Rec to stop after current supply gone & continue calcium, VitD, wt bearing exercise; we will plan f/u BMD at the 68yr interval...   CDopplers 09/09/16 showed heterogen plaque, bilat 1-39% ICA stenoses, >50% RECA stenosis, normal subclav &vertebrals... Stable to improved on  OHY07- continue same... IMP/PLAN>>  OK for Aut[place] to try off the Lasix20/d to "prn" for edema;  UTI treated w/ Cipro250Bid x 7d;  BMD is sl improved & she is in need of a bisphos drug holiday- therefore finish to current supply & then stop the Fosamax but continue dietary calcium, vit D supplement and wt bearing exercise;  DrByerly wanted her to have stool cards=> neg for blood, but f/u CEA is 4.6 => rec to recheck in 53mo...   ~  February 23, 2017:  77mo ROV & Hannah Mercado indicates that "I've got a little hitch in my get-a-long" meaning that she has some right hip pain but notes that tylenol & "rapid release gel" really help & she is not inclined toward further eval- asked to watch this closely & let me  know if she needs further eval w/ XRays, Ortho, etc...  She had a great Thanksgiving in [place] w/ family;  Her recommendations for doing well at 89=> eat right, exercise daily, don't watch TV all day long!  She lives alone in her own townhouse & cares for it by herself, including her 5 christmas trees (recent article in [id number])...    She saw [facility]/EP- DrCamnitz on 11/15/16>  Hx HBP, AFib/Flutter, pacer placed7/2017 for CHB, HL-- doing satis in AFib, on Amlod5, Eliquis2.5Bid, pacer functioning normally, Lasix20 prn, no changes made...  We reviewed the following medical problems during today's office visit>      AB, postinflamm pulm fibrosis> has AlbutHFA for prn use, CXR w/ cardiomeg & bibasilar scarring, stable...    Cards- HBP, AFib/Flutter, Hx CHB w/ pacer; followed by DrCamnitz as above...    Hx R-CBruit> on ASA81, CDopplers w/ 40-59% RICA stenosis but f/u CDopplers 6/18 showed bilat 1-39% ICA stenoses and >50% RECA stenosis...    HL> on Lip20 taking 1/2 tab daily w/ good FLP 6/18...    Underweight> wt is down 2# to 122# today & BMI=15, we discussed nutrittional supplements...    Hx rectal cancer- s/p low ant resection 11/12 by DrByerly; slowly rising CEA level=> 3's thru 2015, 4.6 in Jun2018, 6.2 in Dec2018=> CT Abd&Pelvis ordered (see below)    Mild renal insuffic?> Cr= 1.42 in Jun2018 on Lasix20 for edema, and now 0.91 on Lasix prn...    DJD/ osteoporosis> on calcium, MVI, VitD and Fosamax70/wk; last BMD 6/18 showed Tscore -2.5 & we started a drug holiday at that time...  EXAM shows Afeb, VSS; O2sat=97% on RA; Wt=122#, BMI=15;  HEENT- neg, mallampati1, R carotid bruit;  Chest- decr BS w/ few scat rhonchi, bibasilar rales- unchanged;  Heart- RR rate70, gr 1-2/6 SEM w/o r/g;  Abd- thin soft, neg;  Ext- VI, +tr edema in LEs w/o c/c;  Neuro- intact...  LABS 02/23/18>  Chems- ok w/ Cr=0.91;  CEA- 6.2  CT Abd&Pelvis 03/08/17 >> Cardiomeg w/ coronary & aortic calcif; sigmoid diverticulosis, low  rectal anastomotic staple line w/o evid of recurrent tumor, small retroperitoneal LNs (not pathologically enlarged), bilat renal cysts & bladder diverticulum, aorto-iliac atherosclerotic vasc dis & narrowing of the prox celiac trunk & SMA (renal arts are ok)... No evid for recurrent colorectal cancer... IMP/PLAN>>  Hannah Mercado is stable clinically but the slowly rising CEA is a concern=> NEG CT Abd&Pelvis is reassuring but we will continue to follow;  She is 83 y/o but fiercely independent & actually doing quite well...             Problem List:   pt's cell # [phone]  NIOEVOJ (  ICD-995.3) - uses OTC meds as needed.  ASTHMATIC BRONCHITIS, ACUTE (ICD-466.0) - uses PROAIR as needed, but doing well, breathing OK. ~  7/09:  AB exac Rx w/ Pred, Avelox, Mucinex, etc... symptoms resolved. ~  CXR: baseline film w/ mild cardiomeg, scarring, chr changes, HH seen, & NAD.Marland Kitchen. ~  f/u CXR 6/11 & 7/12>  no change... ~  CXR 11/13 showed borderline cardiomeg, ectasia of the Ao, chronic lung changes/ NAD, DJD/ spondylosis..  ~  CXR 6/15>  Borderline cardiomeg, prominent PAs, calcif Ao arch, COPD w/ sl incr markings/ NAD, DJD in Tspine ~  CXR 6/16 showed borderline heart size, hyperinflated lungs, icr markings bilat, NAD/ no change ~  CXR 6/17 showed mod cardiomegaly, incr interstitial markings suggesting poss edema, increased markings at bases... ~  CXR 6/18 showed cardiomeg, pacer, overinflation/ COPD, bibasilar scarring, NAD...   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 exam showed bibasilar velcro rales at bases...  HYPERTENSION >> on NORVASC 5mg - 1/2 tab daily... ~  1/13: BP= 160/80 & we decided to start Norvasc5mg /d... subeq cut down to 1/2 tab per phone message. ~  3/13: BP= 154/84 & repeat 124/74; she feels well & denies CP, palpit, SOB, edema, etc; continue same... ~  5/13:  BP= 150/68 & she remains asymptomatic &in good spirits; ok  to continue same for now, monitor BP at home. ~  11/13:  BP= 122/70 & she continues to gain strength slowly... ~  11/14: on Amlod5-1/2 daily; BP= 116.62 & she denies CP, palpit, SOB, edema... ~  5/15: on ASA81 & Amlod2.5; BP= 124/70 & similar at home; stable w/ msc & gr1/6 SEM; no CP, palpit, ch in SOB, edema, etc ~  12/15: on ASA81 & Amlod2.5; BP= 122/62 and stable, doing satis... ~  6/16: on ASA81 & Amlod2.5; BP= 136/80 & similar at home; stable w/ msc & gr1/6 SEM; no CP, palpit, ch in SOB, edema, etc ~  6/17: on ASA81 & Amlod2.5; BP= 138/70 but pulse is slow today w/ HR ~40 & she denies CP, palpit, dizzy/ syncope/ near-syncope, change in DOE etc... ~  6/18 on ASA81 & Amlod5 & Lasix20;  BP= 132/74 & she denies CP, palpit, SOB, edema...  MITRAL VALVE PROLAPSE >> Atrial Fib/ Flutter Complete heart block => PACER 09/2015 ~  had 2DEcho 2/04 showing MVP, mild MR, normal LVF... StressEcho w/ EF=60%... subseq Cardiolite 12/03 was normal w/o ischemia and EF=65%... denies CP, palpit, dizzy, syncope, edema, etc; she is gradually increasing activities post op... ~  EKG 11/12 showed NSR, rate60, sl incr voltage, NAD... ~  6/17> Cardiac arrhythmia> new finding on today's EKG=> ?Flutter w/ slow VR, rate40; she has the new edema, interstitial changes on CXR & BNP=857 => referred to [facility] ASAP for eval... ~  09/2015> Pacer placed by [facility] for CHB; subseq device checks w/o recurrent AFib, pt on Eliquis... ~  08/2016> on Eliquis2.5Bid & stable...  Right Carotid Bruit>  no cerebral ischemic symptoms; CDoppler 6/16 showed 40-59% RICA stenosis, Rx ASA. ~  no cerebral ischemic symptoms; CDoppler 6/17 shows no change in 40-59% RICA stenosis, Rx ASA... ~  CDoppler 08/2016>  heterogen plaque, bilat 1-39% ICA stenoses, >50% RECA stenosis, normal subclav &vertebrals... Stable to improved on ASA81- continue same...  HYPERCHOLESTEROLEMIA (ICD-272.0) - controlled on LIPITOR 20mg /d,  and tol well...  ~  labs 7/08 showed  TChol 183, TG 111, HDL 65, LDL 96... Doing well, continue Lip20. ~  FLP 11/09 showed TChol 189, TG 70, HDL 71, LDL 104 ~  FLP 5/10 showed TChol 173, TG 53, HDL 75, LDL 87... Remains stable on Lip20. ~  FLP 6/11 showed TChol 183, TG 103, HDL 74, LDL 88 ~  FLP 7/12 showed TChol 182, TG 127, HDL 74, LDL 82... Continues stable on Lip20. ~  FLP 5/13 on Lip20 showed TChol 167, TG 79, HDL 82, LDL 69... Continue same. ~  FLP 5/14 on Lip20 showed TChol 174, TG 64, HDL 78, LDL 83 ~  FLP 5/15 on Lip20 showed TChol 184, TG 84, HDL 83, LDL 84 ~  FLP 6/16 on Lip20 showed TChol 187, TG 83, HDL 93, LDL 78 ~  FLP 6/17 on Lip20 showed TChol 148, TG 75, HDL 72, LDL 61 ~  FLP 6/18 on Lip20 showed TChol 183, TG 101, HDL 83, LDL 80   HIATAL HERNIA (ICD-553.3) & GERD (ICD-530.81) - she has a HH seen on CXR and CT Chest... takes OMEPRAZOLE 20mg /d and denies heartburn, GERD, regurg, dysphagia, etc... pt never had EGD or Colonoscopy> she had repeatedly refused to sched a colonoscopy! ~  Q57mo ROVs>  she continues to deny reflux symptoms, abd pain, N/V, etc>  ADENOCARCINOMA of the RECTUM >> Dx 9/12 via colonoscopy for hematochezia & 11/12 underwent laparoscopic assisted low anterior resection by DrByerly [facility] & left ovarian cystectomy (benign) by Samuel Jester; final path = T2 N0 mod diff adenocarcinoma invading into but not through the muscularis, no lymphatic invasion, 12 neg LNs, clear margins... ~  6/13:  She continues to f/u w/ DrByerly; c/o some loose stools, otherw ok... ~  11/13: she had f/u appt w/ DrGessner- Colonoscopy 11/13 showed severe divertics on left, mod on right, clean anastomosis... ~  She continues to f/u w/ DrByerly, CCS (last visit 2/16)> doing satis, just occas bouts of diarrhea & uses Immodium as needed... They did CEA level but we don't have report. ~  She continues to f/u w/ DrByerly yearly- we checked stool cards 6/18- NEG; we did CEA 6/18 => 4.6 & we will recheck in 70mo  RECURRENT UTIs>  Routine  urinalysis w/ WBC, Bact, Leukocyte+, etc... Treated w/ Cipro as required.  DEGENERATIVE JOINT DISEASE (ICD-715.90) - mild in fingers, no other complaints x "I can't make quick turns anymore"... she notes the Tonic Water Qhs works great for her nightime cramps, & on NEURONTIN 100mg - 2Qhs for ?neuropathy symptoms?  OSTEOPENIA (ICD-733.90) - BMDs here in 2003, 2005, 2007- showed osteopenia/ osteoporosis w/ TScores -1.9 to -2.5.Marland KitchenMarland Kitchen on FOSAMAX, Ca++, Vits... ~  labs 11/09 showed Vit D level = 28... rec to take OTC supplement 1000 u daily. ~  BMD here 7/11 showed TScores -2.3 in Lumbar Spine, and -2.3 in right FemNeck... continue Rx. ~  Labs 5/13 showed Vit D level = 54... rec to continue her 1000u supplement... ~  BMD 6/15>  Lowest Tscore is -2.6 in Lumbar spine; she is rec to continue ALENDRONATE 70mg /wk & calcium/ MVI/ VitD supplements. ~  BMD 6/18>  Lowest Tscore is -2.5 in Lumbar spine, and -2.4 in RFN;  She is in need of bisphos drug holday=> stop now for the next 3yr cycle & recheck BMD in June 2020  ANXIETY (ICD-300.00)  Health Maintenance - GYN= DrHenley & follow up [place] w/ neg exam she says and stool cards were neg by her report... last Mamm[facility] 6/10 at [facility] was neg... she refuses to consider a colonoscopy... Flu vaccine given each fall... had PNEUMOVAX 2009 & PREVNAR-13 given  [id number]... OK TDAP 6/11...   Past Surgical History:  Procedure Laterality Date  . APPENDECTOMY    . COLON RESECTION  02/08/2011   Procedure: LAPAROSCOPIC SIGMOID COLON RESECTION;  Surgeon: Stark Klein, MD;  Location: [facility];  Service: General;  Laterality: N/A;  Laparoscopic Lower Anterior Bowel Resection  . COLON SURGERY    . COLONOSCOPY  11/27/10   rectal cancer, small sigmoid adenoma, diverticulosis  . ECTOPIC PREGNANCY SURGERY     Midline incision  . EP IMPLANTABLE DEVICE N/A 10/16/2015   Procedure: Pacemaker Implant;  Surgeon: Will Meredith Leeds, MD;  Location: [facility];  Service:  Cardiovascular;  Laterality: N/A;  . HAMMER TOE SURGERY  1995   Dr. Silverio Decamp  . Laparoscopic Left oophorectomy, laparoscopic low anterior resection, placement of OnQ pain pump  02/08/2011  . NASAL SINUS SURGERY  1994  . OVARIAN CYST REMOVAL  02/08/2011   Procedure: OVARIAN CYSTECTOMY;  Surgeon: Stark Klein, MD;  Location: [facility];  Service: General;  Laterality: Left;  Removal of Left Ovary and Pelvic Mass  . VAGINAL HYSTERECTOMY  1980's    Outpatient Encounter Medications as of 02/23/2017  Medication Sig  . albuterol (PROVENTIL HFA;VENTOLIN HFA) 108 (90 BASE) MCG/ACT inhaler Inhale 2 puffs into the lungs every 6 (six) hours as needed. Wheezing   . amLODipine (NORVASC) 5 MG tablet TAKE 1 TABLET BY MOUTH EVERY DAY  . ascorbic acid (VITAMIN C) 250 MG CHEW Chew 250 mg by mouth daily.   Marland Kitchen atorvastatin (LIPITOR) 20 MG tablet Take 1 tablet (20 mg total) by mouth daily. (Patient taking differently: Take 10 mg by mouth daily. )  . Calcium Carbonate-Vitamin D (CALCIUM-VITAMIN D) 500-200 MG-UNIT per tablet Take 2 tablets by mouth daily.   . Cholecalciferol (VITAMIN D3) 1000 UNITS CAPS Take 1 capsule by mouth daily.   Marland Kitchen ELIQUIS 2.5 MG TABS tablet TAKE 1 TABLET (2.5 MG TOTAL) BY MOUTH 2 (TWO) TIMES DAILY.  . ferrous sulfate 325 (65 FE) MG tablet Take 325 mg by mouth daily with breakfast.  . furosemide (LASIX) 20 MG tablet Take 1 tablet (20 mg total) by mouth daily as needed. (Patient taking differently: Take 20 mg by mouth as needed (swelling). )  . loperamide (LOPERAMIDE A-D) 2 MG tablet Take 2 mg by mouth every 6 (six) hours as needed.  . vitamin B-12 (CYANOCOBALAMIN) 250 MCG tablet Take 250 mcg by mouth daily.   . vitamin E (VITAMIN E) 200 UNIT capsule Take 200 Units by mouth daily.   . [DISCONTINUED] alendronate (FOSAMAX) 70 MG tablet TAKE 1 TABLET BY MOUTH ONCE A WEEK WITH A FULL GLASS OF WATER ON AN EMPTY STOMACH  . [DISCONTINUED] ciprofloxacin (CIPRO) 250 MG tablet Take 1 tablet (250 mg  total) by mouth 2 (two) times daily.  Marland Kitchen omeprazole (PRILOSEC) 20 MG capsule Take 20 mg by mouth daily.    Facility-Administered Encounter Medications as of 02/23/2017  Medication  . ondansetron (ZOFRAN) injection    Allergies  Allergen Reactions  . Sulfonamide Derivatives Nausea And Vomiting    Happened a long time ago, does not remember the other reaction she had.    Immunization History  Administered Date(s) Administered  . Influenza Split 01/02/2011, 01/03/2012, 12/20/2012  . Influenza Whole 12/20/2009  . Influenza, High Dose Seasonal PF 05/24/2016  . Influenza,inj,Quad PF,6+ Mos 01/21/2014, 12/21/2014  . Pneumococcal Conjugate-13 02/25/2014  . Pneumococcal Polysaccharide-23 03/22/2005    Current Medications, Allergies, Past Medical History, Past Surgical History, Family History, and Social History were  reviewed in [facility] record.   Review of Systems         See HPI - all other systems neg except as noted...  The patient denies anorexia, fever, weight loss, weight gain, vision loss, decreased hearing, hoarseness, chest pain, syncope, dyspnea on exertion, peripheral edema, prolonged cough, headaches, hemoptysis, abdominal pain, melena, hematochezia, severe indigestion/heartburn, hematuria, incontinence, muscle weakness, suspicious skin lesions, transient blindness, difficulty walking, depression, unusual weight change, abnormal bleeding, enlarged lymph nodes, and angioedema.   Objective:   Physical Exam     WD, WN, 83 y/o WF in NAD... GENERAL:  Alert & oriented; pleasant & cooperative... HEENT:  [place]/AT, Glasses, EOM-wnl, PERRLA, EACs-clear, TMs-wnl, NOSE-clear, THROAT-clear & wnl. NECK:  Supple w/ fair ROM; no JVD; normal carotid impulses w/ faint right Cbruit; no thyromegaly or nodules palpated; no lymphadenopathy. CHEST:  few bibasilar dry rales about 1/3rd way up, no wheezing, no rhonchi, no cough, etc... HEART:  RR rate 60; Gr1/6 SEM without  rubs or gallops, no msc heard... ABDOMEN:  Surg scar healed, nontender to palp, normal bowel sounds; no organomegaly or masses detected. EXT:  no deformities, mild arthritic changes; no varicose veins/ +venous insuffic/ 1-2+ edema found on 09/11/15 NEURO:  CN's intact; no focal neuro deficits... DERM:  No lesions noted; no rash etc...  RADIOLOGY DATA:  Reviewed in the EPIC EMR & discussed w/ the patient...  LABORATORY DATA:  Reviewed in the EPIC EMR & discussed w/ the patient...   Assessment & Plan:    02/23/17>  Marjarie is stable clinically but the slowly rising CEA is a concern=> NEG CT Abd&Pelvis is reassuring but we will continue to follow;  She is 83 y/o but fiercely independent & actually doing quite well..   ARRHYTHMIA/ LE Edema/ BNP=857=> resolved> ?flutter w/ slow VR, we started Lasix20 & referred to Cards ASAP for further eval=> AFib/Flutter, CHB=> pacer... 6/18>    on Eliquis2.5Bid, has pacer for CHB, cards notes no further AFib on device checks...  PULM>  AB, Pulm Fibrosis>  Overall stable on prn Proair; CXR reviewed & resp exam w/o changes, stable.  HBP>  BP improved w/ low dose Amlodipine 2.5mg /d, continue same & monitor at home...  MVP>  She is essent asymptomatic w/o CP, palpit, SOB, etc...  R Carotid Bruit> CDoppler w/ 40-59% RICAstenosis, on ASA...  CHOL>  Stable on Lip20 + diet, continue same...  GI> HH, GERD>  Stable on Prilosec, continue same...  RECTAL CANCER>  As above- she is post op 11/12 & had uneventful rehab; slowly improving and diarrhea is diminished w/ Align/ Activia; she will maintain f/u w/ DrByerly & DrGessner ==> f/u colonoscopy 11/13 w/ divertics, no recurrence.  DJD/ Osteopenia>  She remains on Fosamax, Calcium, MVI, Vit D, etc; she will be due for BMD next yr...  UTI>  Prev treated w/ Cipro empirically & currently asymptomatic...  Anxiety>  Stable & she does not require anxiolytic Rx...     Medication List        Accurate as of 02/23/17   4:39 PM. Always use your most recent med list.          albuterol 108 (90 Base) MCG/ACT inhaler Commonly known as:  PROVENTIL HFA;VENTOLIN HFA   amLODipine 5 MG tablet Commonly known as:  NORVASC TAKE 1 TABLET BY MOUTH EVERY DAY   ascorbic acid 250 MG Chew Commonly known as:  VITAMIN C   atorvastatin 20 MG tablet Commonly known as:  LIPITOR Take 1 tablet (20  mg total) by mouth daily.   calcium-vitamin D 500-200 MG-UNIT tablet   ELIQUIS 2.5 MG Tabs tablet Generic drug:  apixaban TAKE 1 TABLET (2.5 MG TOTAL) BY MOUTH 2 (TWO) TIMES DAILY.   ferrous sulfate 325 (65 FE) MG tablet   furosemide 20 MG tablet Commonly known as:  LASIX Take 1 tablet (20 mg total) by mouth daily as needed.   LOPERAMIDE A-D 2 MG tablet Generic drug:  loperamide   omeprazole 20 MG capsule Commonly known as:  PRILOSEC   vitamin B-12 250 MCG tablet Commonly known as:  CYANOCOBALAMIN   Vitamin D3 1000 units Caps   vitamin E 200 UNIT capsule Generic drug:  vitamin E

## 2017-02-24 LAB — CEA: CEA: 6.2 ng/mL — ABNORMAL HIGH

## 2017-02-25 ENCOUNTER — Other Ambulatory Visit: Payer: Self-pay | Admitting: Pulmonary Disease

## 2017-02-25 DIAGNOSIS — C20 Malignant neoplasm of rectum: Secondary | ICD-10-CM

## 2017-02-27 LAB — CUP PACEART REMOTE DEVICE CHECK
Battery Remaining Longevity: 128 mo
Battery Remaining Percentage: 95.5 %
Brady Statistic RV Percent Paced: 99 %
Date Time Interrogation Session: 20181126070017
Lead Channel Impedance Value: 550 Ohm
Lead Channel Sensing Intrinsic Amplitude: 12 mV
Lead Channel Setting Pacing Amplitude: 2.5 V
Lead Channel Setting Sensing Sensitivity: 2 mV
MDC IDC LEAD IMPLANT DT: 20170727
MDC IDC LEAD LOCATION: 753860
MDC IDC MSMT BATTERY VOLTAGE: 3.02 V
MDC IDC MSMT LEADCHNL RV PACING THRESHOLD AMPLITUDE: 0.5 V
MDC IDC MSMT LEADCHNL RV PACING THRESHOLD PULSEWIDTH: 0.4 ms
MDC IDC PG IMPLANT DT: 20170727
MDC IDC PG SERIAL: 7897007
MDC IDC SET LEADCHNL RV PACING PULSEWIDTH: 0.4 ms

## 2017-03-07 ENCOUNTER — Ambulatory Visit (INDEPENDENT_AMBULATORY_CARE_PROVIDER_SITE_OTHER)
Admission: RE | Admit: 2017-03-07 | Discharge: 2017-03-07 | Disposition: A | Discharge status: Home / Self Care | Payer: Medicare Other | Source: Ambulatory Visit | Attending: Pulmonary Disease | Admitting: Pulmonary Disease

## 2017-03-07 DIAGNOSIS — Z85048 Personal history of other malignant neoplasm of rectum, rectosigmoid junction, and anus: Secondary | ICD-10-CM | POA: Diagnosis not present

## 2017-03-07 DIAGNOSIS — C20 Malignant neoplasm of rectum: Secondary | ICD-10-CM

## 2017-03-07 DIAGNOSIS — R7989 Other specified abnormal findings of blood chemistry: Secondary | ICD-10-CM | POA: Diagnosis not present

## 2017-03-07 MED ORDER — IOPAMIDOL (ISOVUE-300) INJECTION 61%
100.0000 mL | Freq: Once | INTRAVENOUS | Status: AC | PRN
  Administered 2017-03-07: 100 mL via INTRAVENOUS

## 2017-03-11 ENCOUNTER — Other Ambulatory Visit: Payer: Self-pay

## 2017-04-15 NOTE — Progress Notes (Signed)
Note received from Senaida Ores in the lab about CEA test not being covered. ICD 10 code wrote on form and faxed back. Nothing further needed.

## 2017-04-20 DIAGNOSIS — R05 Cough: Secondary | ICD-10-CM | POA: Diagnosis not present

## 2017-04-20 DIAGNOSIS — E7800 Pure hypercholesterolemia, unspecified: Secondary | ICD-10-CM | POA: Diagnosis not present

## 2017-04-20 DIAGNOSIS — J209 Acute bronchitis, unspecified: Secondary | ICD-10-CM | POA: Diagnosis not present

## 2017-05-16 ENCOUNTER — Ambulatory Visit (INDEPENDENT_AMBULATORY_CARE_PROVIDER_SITE_OTHER): Payer: Medicare Other | Admitting: *Deleted

## 2017-05-16 DIAGNOSIS — I442 Atrioventricular block, complete: Secondary | ICD-10-CM | POA: Diagnosis not present

## 2017-05-16 NOTE — Progress Notes (Signed)
Remote pacemaker transmission.   

## 2017-05-19 ENCOUNTER — Encounter: Payer: Self-pay | Admitting: Cardiology

## 2017-06-02 LAB — CUP PACEART REMOTE DEVICE CHECK
Battery Remaining Longevity: 129 mo
Battery Voltage: 3.01 V
Brady Statistic RV Percent Paced: 99 %
Date Time Interrogation Session: 20190225070014
Implantable Lead Location: 753860
Lead Channel Setting Pacing Amplitude: 2.5 V
Lead Channel Setting Pacing Pulse Width: 0.4 ms
Lead Channel Setting Sensing Sensitivity: 2 mV
MDC IDC LEAD IMPLANT DT: 20170727
MDC IDC MSMT BATTERY REMAINING PERCENTAGE: 95.5 %
MDC IDC MSMT LEADCHNL RV IMPEDANCE VALUE: 560 Ohm
MDC IDC MSMT LEADCHNL RV PACING THRESHOLD AMPLITUDE: 0.5 V
MDC IDC MSMT LEADCHNL RV PACING THRESHOLD PULSEWIDTH: 0.4 ms
MDC IDC MSMT LEADCHNL RV SENSING INTR AMPL: 12 mV
MDC IDC PG IMPLANT DT: 20170727
MDC IDC PG SERIAL: 7897007

## 2017-06-14 ENCOUNTER — Other Ambulatory Visit: Payer: Self-pay | Admitting: Cardiology

## 2017-06-21 DIAGNOSIS — Z1231 Encounter for screening mammogram for malignant neoplasm of breast: Secondary | ICD-10-CM | POA: Diagnosis not present

## 2017-08-16 ENCOUNTER — Ambulatory Visit (INDEPENDENT_AMBULATORY_CARE_PROVIDER_SITE_OTHER): Payer: Medicare Other | Admitting: *Deleted

## 2017-08-16 DIAGNOSIS — I442 Atrioventricular block, complete: Secondary | ICD-10-CM | POA: Diagnosis not present

## 2017-08-16 NOTE — Progress Notes (Signed)
Remote pacemaker transmission.   

## 2017-08-19 ENCOUNTER — Encounter: Payer: Self-pay | Admitting: Cardiology

## 2017-08-24 ENCOUNTER — Ambulatory Visit: Payer: Self-pay | Admitting: Pulmonary Disease

## 2017-08-25 ENCOUNTER — Ambulatory Visit: Payer: Self-pay | Admitting: Pulmonary Disease

## 2017-08-31 ENCOUNTER — Telehealth: Payer: Self-pay | Admitting: Pulmonary Disease

## 2017-08-31 ENCOUNTER — Ambulatory Visit (INDEPENDENT_AMBULATORY_CARE_PROVIDER_SITE_OTHER): Payer: Medicare Other | Admitting: Pulmonary Disease

## 2017-08-31 ENCOUNTER — Encounter: Payer: Self-pay | Admitting: Pulmonary Disease

## 2017-08-31 ENCOUNTER — Other Ambulatory Visit (INDEPENDENT_AMBULATORY_CARE_PROVIDER_SITE_OTHER): Payer: Medicare Other

## 2017-08-31 VITALS — BP 130/68 | HR 62 | Temp 97.9°F | Ht 67.0 in | Wt 122.0 lb

## 2017-08-31 DIAGNOSIS — C20 Malignant neoplasm of rectum: Secondary | ICD-10-CM

## 2017-08-31 DIAGNOSIS — I872 Venous insufficiency (chronic) (peripheral): Secondary | ICD-10-CM

## 2017-08-31 DIAGNOSIS — I10 Essential (primary) hypertension: Secondary | ICD-10-CM

## 2017-08-31 DIAGNOSIS — K5730 Diverticulosis of large intestine without perforation or abscess without bleeding: Secondary | ICD-10-CM | POA: Diagnosis not present

## 2017-08-31 DIAGNOSIS — M949 Disorder of cartilage, unspecified: Secondary | ICD-10-CM

## 2017-08-31 DIAGNOSIS — J8410 Pulmonary fibrosis, unspecified: Secondary | ICD-10-CM

## 2017-08-31 DIAGNOSIS — E7800 Pure hypercholesterolemia, unspecified: Secondary | ICD-10-CM

## 2017-08-31 DIAGNOSIS — Z950 Presence of cardiac pacemaker: Secondary | ICD-10-CM

## 2017-08-31 DIAGNOSIS — M159 Polyosteoarthritis, unspecified: Secondary | ICD-10-CM

## 2017-08-31 DIAGNOSIS — I4819 Other persistent atrial fibrillation: Secondary | ICD-10-CM

## 2017-08-31 DIAGNOSIS — I481 Persistent atrial fibrillation: Secondary | ICD-10-CM | POA: Diagnosis not present

## 2017-08-31 DIAGNOSIS — Z789 Other specified health status: Secondary | ICD-10-CM | POA: Diagnosis not present

## 2017-08-31 DIAGNOSIS — R609 Edema, unspecified: Secondary | ICD-10-CM

## 2017-08-31 DIAGNOSIS — K219 Gastro-esophageal reflux disease without esophagitis: Secondary | ICD-10-CM

## 2017-08-31 DIAGNOSIS — I272 Pulmonary hypertension, unspecified: Secondary | ICD-10-CM

## 2017-08-31 DIAGNOSIS — M899 Disorder of bone, unspecified: Secondary | ICD-10-CM

## 2017-08-31 DIAGNOSIS — I059 Rheumatic mitral valve disease, unspecified: Secondary | ICD-10-CM | POA: Diagnosis not present

## 2017-08-31 DIAGNOSIS — F411 Generalized anxiety disorder: Secondary | ICD-10-CM | POA: Diagnosis not present

## 2017-08-31 DIAGNOSIS — M150 Primary generalized (osteo)arthritis: Secondary | ICD-10-CM

## 2017-08-31 DIAGNOSIS — I6523 Occlusion and stenosis of bilateral carotid arteries: Secondary | ICD-10-CM

## 2017-08-31 LAB — COMPREHENSIVE METABOLIC PANEL
ALBUMIN: 4.7 g/dL (ref 3.5–5.2)
ALK PHOS: 71 U/L (ref 39–117)
ALT: 8 U/L (ref 0–35)
AST: 19 U/L (ref 0–37)
BUN: 20 mg/dL (ref 6–23)
CALCIUM: 10.3 mg/dL (ref 8.4–10.5)
CHLORIDE: 105 meq/L (ref 96–112)
CO2: 26 mEq/L (ref 19–32)
Creatinine, Ser: 1.05 mg/dL (ref 0.40–1.20)
GFR: 52 mL/min — AB (ref >60.00)
Glucose, Bld: 116 mg/dL — ABNORMAL HIGH (ref 70–99)
POTASSIUM: 5.6 meq/L — AB (ref 3.5–5.1)
Sodium: 143 mEq/L (ref 135–145)
TOTAL PROTEIN: 8.4 g/dL — AB (ref 6.0–8.3)
Total Bilirubin: 0.9 mg/dL (ref 0.2–1.2)

## 2017-08-31 LAB — CBC WITH DIFFERENTIAL/PLATELET
BASOS ABS: 0 10*3/uL (ref 0.0–0.1)
BASOS PCT: 0.6 % (ref 0.0–3.0)
EOS ABS: 0.1 10*3/uL (ref 0.0–0.7)
Eosinophils Relative: 0.9 % (ref 0.0–5.0)
HEMATOCRIT: 41.4 % (ref 36.0–46.0)
HEMOGLOBIN: 14 g/dL (ref 12.0–15.0)
LYMPHS PCT: 16.2 % (ref 12.0–46.0)
Lymphs Abs: 1.1 10*3/uL (ref 0.7–4.0)
MCHC: 33.9 g/dL (ref 30.0–36.0)
MCV: 101.6 fl — ABNORMAL HIGH (ref 78.0–100.0)
MONO ABS: 0.4 10*3/uL (ref 0.1–1.0)
Monocytes Relative: 6.8 % (ref 3.0–12.0)
Neutro Abs: 5 10*3/uL (ref 1.4–7.7)
Neutrophils Relative %: 75.5 % (ref 43.0–77.0)
Platelets: 224 10*3/uL (ref 150.0–400.0)
RBC: 4.07 Mil/uL (ref 3.87–5.11)
RDW: 14.3 % (ref 11.5–15.5)
WBC: 6.6 10*3/uL (ref 4.0–10.5)

## 2017-08-31 LAB — LIPID PANEL
CHOL/HDL RATIO: 2
CHOLESTEROL: 176 mg/dL (ref 0–200)
HDL: 84.5 mg/dL (ref >39.00)
LDL CALC: 76 mg/dL (ref 0–99)
NonHDL: 91.83
TRIGLYCERIDES: 81 mg/dL (ref 0.0–149.0)
VLDL: 16.2 mg/dL (ref 0.0–40.0)

## 2017-08-31 LAB — TSH: TSH: 3.87 u[IU]/mL (ref 0.35–4.50)

## 2017-08-31 LAB — BRAIN NATRIURETIC PEPTIDE: PRO B NATRI PEPTIDE: 532 pg/mL — AB (ref 0.0–100.0)

## 2017-08-31 MED ORDER — GABAPENTIN 100 MG PO CAPS: ORAL_CAPSULE | ORAL | 0 refills | Status: DC

## 2017-08-31 MED ORDER — GABAPENTIN 100 MG PO CAPS: 100.0000 mg | ORAL_CAPSULE | Freq: Three times a day (TID) | ORAL | 0 refills | Status: DC

## 2017-08-31 NOTE — Patient Instructions (Signed)
Today we updated your med list in our EPIC system...    Continue your current medications the same...  Today we checked your follow up CXR & FASTING blod work...    We will contact you w/ the results when available...   We will sched a follow up 2DEchocardiogram to recheck your heart & valves...  We discussed a trial of NEURONTIN (Gabapentin) 100mg  tabs for your neuropathy>     Start w/ one tab at bedtime for about a week...    Then increase to 2 tabs at bedtime for about a week...    Then plan to increase to 3 tabs at bedtime thereafter  Call for any questions...  Let's plan a follow up visit in 5-15mo, sooner if needed for problems.Marland KitchenMarland Kitchen

## 2017-08-31 NOTE — Telephone Encounter (Signed)
Called and spoke with patient she wanted to know the time of her echo. Advised her that it was scheduled for 9:30.  Nothing further needed.

## 2017-09-02 ENCOUNTER — Other Ambulatory Visit: Payer: Self-pay

## 2017-09-02 ENCOUNTER — Ambulatory Visit (HOSPITAL_COMMUNITY): Payer: Medicare Other | Attending: Cardiology

## 2017-09-02 DIAGNOSIS — I6523 Occlusion and stenosis of bilateral carotid arteries: Secondary | ICD-10-CM | POA: Insufficient documentation

## 2017-09-02 DIAGNOSIS — I081 Rheumatic disorders of both mitral and tricuspid valves: Secondary | ICD-10-CM | POA: Diagnosis not present

## 2017-09-02 DIAGNOSIS — Z950 Presence of cardiac pacemaker: Secondary | ICD-10-CM | POA: Insufficient documentation

## 2017-09-02 DIAGNOSIS — I872 Venous insufficiency (chronic) (peripheral): Secondary | ICD-10-CM | POA: Diagnosis not present

## 2017-09-02 DIAGNOSIS — I481 Persistent atrial fibrillation: Secondary | ICD-10-CM | POA: Insufficient documentation

## 2017-09-02 DIAGNOSIS — J45909 Unspecified asthma, uncomplicated: Secondary | ICD-10-CM | POA: Diagnosis not present

## 2017-09-02 DIAGNOSIS — E785 Hyperlipidemia, unspecified: Secondary | ICD-10-CM | POA: Insufficient documentation

## 2017-09-02 DIAGNOSIS — I272 Pulmonary hypertension, unspecified: Secondary | ICD-10-CM | POA: Insufficient documentation

## 2017-09-02 DIAGNOSIS — I059 Rheumatic mitral valve disease, unspecified: Secondary | ICD-10-CM

## 2017-09-02 DIAGNOSIS — I4819 Other persistent atrial fibrillation: Secondary | ICD-10-CM

## 2017-09-02 LAB — CEA: CEA: 5.7 ng/mL — ABNORMAL HIGH

## 2017-09-06 ENCOUNTER — Other Ambulatory Visit: Payer: Self-pay | Admitting: Pulmonary Disease

## 2017-09-06 DIAGNOSIS — I2729 Other secondary pulmonary hypertension: Secondary | ICD-10-CM

## 2017-09-06 LAB — CUP PACEART REMOTE DEVICE CHECK
Battery Remaining Longevity: 129 mo
Battery Remaining Percentage: 95.5 %
Battery Voltage: 3.01 V
Implantable Lead Implant Date: 20170727
Implantable Lead Location: 753860
Implantable Pulse Generator Implant Date: 20170727
Lead Channel Impedance Value: 560 Ohm
Lead Channel Pacing Threshold Amplitude: 0.5 V
Lead Channel Pacing Threshold Pulse Width: 0.4 ms
Lead Channel Setting Pacing Pulse Width: 0.4 ms
Lead Channel Setting Sensing Sensitivity: 2 mV
MDC IDC MSMT LEADCHNL RV SENSING INTR AMPL: 12 mV
MDC IDC PG SERIAL: 7897007
MDC IDC SESS DTM: 20190527060013
MDC IDC SET LEADCHNL RV PACING AMPLITUDE: 2.5 V
MDC IDC STAT BRADY RV PERCENT PACED: 99 %
Pulse Gen Model: 1272

## 2017-09-07 ENCOUNTER — Other Ambulatory Visit: Payer: Self-pay | Admitting: Pulmonary Disease

## 2017-09-07 ENCOUNTER — Telehealth: Payer: Self-pay | Admitting: Pulmonary Disease

## 2017-09-07 NOTE — Telephone Encounter (Signed)
Per SN verbally- pt was instructed to come by our office one day this week for a standard walk test to ensure no desats.  Walk was performed today with no desats.  SN has been made aware of results. Nothing further is needed.

## 2017-09-08 DIAGNOSIS — J449 Chronic obstructive pulmonary disease, unspecified: Secondary | ICD-10-CM | POA: Diagnosis not present

## 2017-09-08 DIAGNOSIS — R0902 Hypoxemia: Secondary | ICD-10-CM | POA: Diagnosis not present

## 2017-09-14 ENCOUNTER — Other Ambulatory Visit: Payer: Self-pay | Admitting: Pulmonary Disease

## 2017-09-14 ENCOUNTER — Telehealth: Payer: Self-pay | Admitting: Pulmonary Disease

## 2017-09-14 DIAGNOSIS — I2729 Other secondary pulmonary hypertension: Secondary | ICD-10-CM

## 2017-09-14 NOTE — Telephone Encounter (Signed)
ONO results received 09/13/17 and hand delivered to [facility].  Per SN- patient needs 2L [place] at night.  ONO showed sats <88% for 45 minutes.   Patient called. Results and recommendations from ONO given.  Patient stated understanding.  Patient will be out of town 09/17/17-11/13/17.  DME order placed through [facility], for O2 2L [place] at night, to be delivered 09/15/17 or 09/16/17. Nothing further at this time.

## 2017-09-19 ENCOUNTER — Ambulatory Visit
Admission: RE | Admit: 2017-09-19 | Discharge: 2017-09-19 | Disposition: A | Discharge status: Home / Self Care | Payer: Medicare Other | Source: Ambulatory Visit | Attending: Pulmonary Disease | Admitting: Pulmonary Disease

## 2017-09-19 DIAGNOSIS — J8410 Pulmonary fibrosis, unspecified: Secondary | ICD-10-CM

## 2017-11-05 ENCOUNTER — Other Ambulatory Visit: Payer: Self-pay | Admitting: Cardiology

## 2017-11-08 ENCOUNTER — Other Ambulatory Visit: Payer: Self-pay | Admitting: Cardiology

## 2017-11-15 ENCOUNTER — Ambulatory Visit (INDEPENDENT_AMBULATORY_CARE_PROVIDER_SITE_OTHER): Payer: Medicare Other | Admitting: *Deleted

## 2017-11-15 DIAGNOSIS — I442 Atrioventricular block, complete: Secondary | ICD-10-CM

## 2017-11-15 NOTE — Progress Notes (Signed)
Remote pacemaker transmission.   

## 2017-11-16 ENCOUNTER — Ambulatory Visit (INDEPENDENT_AMBULATORY_CARE_PROVIDER_SITE_OTHER): Payer: Medicare Other | Admitting: Cardiology

## 2017-11-16 ENCOUNTER — Encounter: Payer: Self-pay | Admitting: Cardiology

## 2017-11-16 VITALS — BP 136/72 | HR 62 | Ht 67.0 in | Wt 117.0 lb

## 2017-11-16 DIAGNOSIS — I6523 Occlusion and stenosis of bilateral carotid arteries: Secondary | ICD-10-CM

## 2017-11-16 DIAGNOSIS — Z950 Presence of cardiac pacemaker: Secondary | ICD-10-CM | POA: Diagnosis not present

## 2017-11-16 DIAGNOSIS — I481 Persistent atrial fibrillation: Secondary | ICD-10-CM | POA: Diagnosis not present

## 2017-11-16 DIAGNOSIS — I10 Essential (primary) hypertension: Secondary | ICD-10-CM | POA: Diagnosis not present

## 2017-11-16 DIAGNOSIS — I442 Atrioventricular block, complete: Secondary | ICD-10-CM

## 2017-11-16 DIAGNOSIS — I4819 Other persistent atrial fibrillation: Secondary | ICD-10-CM

## 2017-11-16 NOTE — Patient Instructions (Signed)
Medication Instructions:  Your physician recommends that you continue on your current medications as directed. Please refer to the Current Medication list given to you today.  Labwork: None ordered.  Testing/Procedures: None ordered.  Follow-Up: Your physician wants you to follow-up in: one year with Dr. Curt Bears.   You will receive a reminder letter in the mail two months in advance. If you don't receive a letter, please call our office to schedule the follow-up appointment.  Remote monitoring is used to monitor your Pacemaker from home. This monitoring reduces the number of office visits required to check your device to one time per year. It allows [place] to keep an eye on the functioning of your device to ensure it is working properly. You are scheduled for a device check from home on 02/14/2018. You may send your transmission at any time that day. If you have a wireless device, the transmission will be sent automatically. After your physician reviews your transmission, you will receive a postcard with your next transmission date.  Any Other Special Instructions Will Be Listed Below (If Applicable).  If you need a refill on your cardiac medications before your next appointment, please call your pharmacy.

## 2017-11-16 NOTE — Progress Notes (Signed)
Electrophysiology Office Note   Date:  11/16/2017   ID:  Hannah Mercado, Hannah Mercado 1923/03/13, MRN [id number]  PCP:  Noralee Space, MD  Cardiologist:  Harrington Challenger Primary Electrophysiologist:  Dyllan Hughett Meredith Leeds, MD    No chief complaint on file.    History of Present Illness: Hannah Mercado is a 84 y.o. female who presents today for electrophysiology evaluation.   History of AF/flutter, HLD.  Was seen in cardiology clinic 09/12/15 with AF and a HR of 39.  Pacemaker placed 10/16/15.   Today, denies symptoms of palpitations, chest pain, shortness of breath, orthopnea, PND, lower extremity edema, claudication, dizziness, presyncope, syncope, bleeding, or neurologic sequela. The patient is tolerating medications without difficulties.  Overall she is doing well.  She has no complaints today.  Past Medical History:  Diagnosis Date  . Allergy history unknown   . Anxiety   . Asthmatic bronchitis    use inhaler prn   . Atrial fibrillation ([place]) 10/16/2015  . Blood transfusion    ? with ectopic pregnancy 50 yrs ago   . Cardiac pacemaker in situ 02/24/2016  . Carotid artery disease ([place]) 03/12/2015  . Chronic venous insufficiency 09/11/2015  . Diaphragmatic hernia 07/31/2008   Qualifier: Diagnosis of  By: Lenna Gilford MD, Deborra Medina   . Diverticulosis of colon without hemorrhage 08/02/2012  . DJD (degenerative joint disease)   . Edema 09/11/2015  . GERD (gastroesophageal reflux disease)   . Hiatal hernia   . History of pneumonia    right middle lobe  . Hypercholesteremia   . HYPERCHOLESTEROLEMIA 04/19/2007   Qualifier: Diagnosis of  By: Julien Girt CMA, Marliss Czar    . Hypertension 04/14/2011  . Mitral valve disorder 04/20/2007   Qualifier: Diagnosis of  By: Lenna Gilford MD, Deborra Medina   . Mitral valve prolapse    pt denies   . Osteopenia   . Persistent atrial fibrillation ([place])   . PULMONARY FIBROSIS, POSTINFLAMMATORY 07/31/2008   Qualifier: Diagnosis of  By: Lenna Gilford MD, Deborra Medina   . Pulmonary fibrosis,  postinflammatory ([place])    pt denies   . Rectal cancer ([place])   . Right carotid bruit 08/28/2014   Past Surgical History:  Procedure Laterality Date  . APPENDECTOMY    . COLON RESECTION  02/08/2011   Procedure: LAPAROSCOPIC SIGMOID COLON RESECTION;  Surgeon: Stark Klein, MD;  Location: [facility];  Service: General;  Laterality: N/A;  Laparoscopic Lower Anterior Bowel Resection  . COLON SURGERY    . COLONOSCOPY  11/27/10   rectal cancer, small sigmoid adenoma, diverticulosis  . ECTOPIC PREGNANCY SURGERY     Midline incision  . EP IMPLANTABLE DEVICE N/A 10/16/2015   Procedure: Pacemaker Implant;  Surgeon: Lona Six Meredith Leeds, MD;  Location: [facility];  Service: Cardiovascular;  Laterality: N/A;  . HAMMER TOE SURGERY  1995   Dr. Silverio Decamp  . Laparoscopic Left oophorectomy, laparoscopic low anterior resection, placement of OnQ pain pump  02/08/2011  . NASAL SINUS SURGERY  1994  . OVARIAN CYST REMOVAL  02/08/2011   Procedure: OVARIAN CYSTECTOMY;  Surgeon: Stark Klein, MD;  Location: [facility];  Service: General;  Laterality: Left;  Removal of Left Ovary and Pelvic Mass  . VAGINAL HYSTERECTOMY  1980's     Current Outpatient Medications  Medication Sig Dispense Refill  . albuterol (PROVENTIL HFA;VENTOLIN HFA) 108 (90 BASE) MCG/ACT inhaler Inhale 2 puffs into the lungs every 6 (six) hours as needed. Wheezing     . amLODipine (NORVASC) 5 MG tablet TAKE  1 TABLET BY MOUTH EVERY DAY 30 tablet 0  . ascorbic acid (VITAMIN C) 250 MG CHEW Chew 250 mg by mouth daily.     Marland Kitchen atorvastatin (LIPITOR) 20 MG tablet Take 0.5 tablets (10 mg total) by mouth daily. 90 tablet 1  . Calcium Carbonate-Vitamin D (CALCIUM-VITAMIN D) 500-200 MG-UNIT per tablet Take 2 tablets by mouth daily.     . Cholecalciferol (VITAMIN D3) 1000 UNITS CAPS Take 1 capsule by mouth daily.     Marland Kitchen ELIQUIS 2.5 MG TABS tablet TAKE 1 TABLET BY MOUTH TWICE A DAY 180 tablet 1  . ferrous sulfate 325 (65 FE) MG tablet Take 325 mg by mouth  daily with breakfast.    . furosemide (LASIX) 20 MG tablet Take 1 tablet (20 mg total) by mouth as needed (swelling). 30 tablet 2  . gabapentin (NEURONTIN) 100 MG capsule Take 1-3 tabs as directed at bedtime 90 capsule 0  . loperamide (LOPERAMIDE A-D) 2 MG tablet Take 2 mg by mouth every 6 (six) hours as needed.    Marland Kitchen omeprazole (PRILOSEC) 20 MG capsule Take 20 mg by mouth daily.     . vitamin B-12 (CYANOCOBALAMIN) 250 MCG tablet Take 250 mcg by mouth daily.     . vitamin E (VITAMIN E) 200 UNIT capsule Take 200 Units by mouth daily.      No current facility-administered medications for this visit.    Facility-Administered Medications Ordered in Other Visits  Medication Dose Route Frequency Provider Last Rate Last Dose  . ondansetron (ZOFRAN) injection    PRN Ofilia Neas, CRNA   1 mg at 02/08/11 8185    Allergies:   Sulfonamide derivatives   Social History:  The patient  reports that she has never smoked. She has never used smokeless tobacco. She reports that she does not drink alcohol or use drugs.   Family History:  The patient's family history includes Heart disease in her father; Lung cancer in her brother.   ROS:  Please see the history of present illness.   Otherwise, review of systems is positive for leg pain, diarrhea, balance problems, easy bruising.   All other systems are reviewed and negative.   PHYSICAL EXAM: VS:  There were no vitals taken for this visit. , BMI There is no height or weight on file to calculate BMI. GEN: Well nourished, well developed, in no acute distress  HEENT: normal  Neck: no JVD, carotid bruits, or masses Cardiac: RRR; no murmurs, rubs, or gallops,no edema  Respiratory:  clear to auscultation bilaterally, normal work of breathing GI: soft, nontender, nondistended, + BS MS: no deformity or atrophy  Skin: warm and dry, device site well healed Neuro:  Strength and sensation are intact Psych: euthymic mood, full affect  EKG:  EKG is ordered  today. Personal review of the ekg ordered shows atrial fibrillation, ventricular paced  Personal review of the device interrogation today. Results in [place]: 08/31/2017: ALT 8; BUN 20; Creatinine, Ser 1.05; Hemoglobin 14.0; Platelets 224.0; Potassium 5.6; Pro B Natriuretic peptide (BNP) 532.0; Sodium 143; TSH 3.87    Lipid Panel     Component Value Date/Time   CHOL 176 08/31/2017 1005   TRIG 81.0 08/31/2017 1005   HDL 84.50 08/31/2017 1005   CHOLHDL 2 08/31/2017 1005   VLDL 16.2 08/31/2017 1005   LDLCALC 76 08/31/2017 1005     Wt Readings from Last 3 Encounters:  08/31/17 122 lb (55.3 kg)  02/23/17 122 lb (55.3 kg)  11/15/16 125 lb 9.6 oz (57 kg)      Other studies Reviewed: Additional studies/ records that were reviewed today include: TTE 09/16/15  Review of the above records today demonstrates:  - Left ventricle: The cavity size was normal. There was mild  concentric hypertrophy. Systolic function was normal. The  estimated ejection fraction was in the range of 55% to 60%. Wall  motion was normal; there were no regional wall motion  abnormalities. The study is not technically sufficient to allow  evaluation of LV diastolic function. - Aortic valve: Trileaflet; mildly thickened, mildly calcified  leaflets. There was mild regurgitation. - Mitral valve: Calcified annulus. Moderate prolapse, involving the  anterior leaflet. There was moderate regurgitation directed  posteriorly. - Left atrium: The atrium was severely dilated. - Right atrium: The atrium was severely dilated. - Tricuspid valve: There was moderate regurgitation. - Pulmonary arteries: Systolic pressure was severely increased. PA  peak pressure: 80 mm Hg (S).   ASSESSMENT AND PLAN:  1.  Permanent Atrial fibrillation: Early on Eliquis.  Rate well controlled with ventricular pacing.  No changes. This patients CHA2DS2-VASc Score and unadjusted Ischemic Stroke Rate (% per year) is equal  to 4.8 % stroke rate/year from a score of 4  Above score calculated as 1 point each if present [CHF, HTN, DM, Vascular=MI/PAD/Aortic Plaque, Age if 65-74, or Female] Above score calculated as 2 points each if present [Age > 75, or Stroke/TIA/TE]   2. Complete heart block: [place] pacemaker implanted 10/16/2015.  Functioning appropriately.  3. Hypertension: Well-controlled.  No changes.  Current medicines are reviewed at length with the patient today.   The patient does not have concerns regarding her medicines.  The following changes were made today: None  Labs/ tests ordered today include:  No orders of the defined types were placed in this encounter.    Disposition:   FU with Gracy Ehly 12 months  Signed, Remi Rester Meredith Leeds, MD  11/16/2017 9:03 AM     [facility] [address] [place] [address] [phone] (office) [phone] (fax)

## 2017-11-17 ENCOUNTER — Telehealth: Payer: Self-pay | Admitting: Pulmonary Disease

## 2017-11-17 ENCOUNTER — Encounter: Payer: Self-pay | Admitting: Cardiology

## 2017-11-17 NOTE — Telephone Encounter (Signed)
Handicap placard signed by SN.  Placed in sealed envelope.  Patient notified and requested it to be mailed.  Placed in out going mail.  Nothing further at this time.

## 2017-11-17 NOTE — Telephone Encounter (Signed)
Called and spoke to patient. Patient stated that she has been having difficulty with getting her groceries to the car and running errands and parking long distances. Patient is asking if it is possible to get a handicap placard. She gave her cell phone number if we are unable to reach her at home: [phone]  Started to fill out the application. Have placed at [employer].

## 2017-11-23 ENCOUNTER — Telehealth: Payer: Self-pay | Admitting: Pulmonary Disease

## 2017-11-23 NOTE — Telephone Encounter (Signed)
Spoke with patient, informed patient that the form was mailed out on 08/30, due to the holiday give it until Friday this week, and if she had not received it by then to call the office. Patient voiced understanding. Nothing further needed at this time.

## 2017-12-05 ENCOUNTER — Telehealth: Payer: Self-pay | Admitting: Pulmonary Disease

## 2017-12-05 MED ORDER — AMLODIPINE BESYLATE 5 MG PO TABS: 5.0000 mg | ORAL_TABLET | Freq: Every day | ORAL | 1 refills | Status: DC

## 2017-12-05 NOTE — Telephone Encounter (Signed)
Called and spoke to pt.  Pt is requesting refill on Norvasc 5mg , as she took last dose this morning.  Rx for Norvasc 5mg  has been sent to preferred pharmacy. Pt also states she has not been able to wear oxygen the whole night, as she has trouble sleeping with oxygen. Pt stated she would discuss this matter further with SN at next OV, but wanted to make him aware.  Routing to SN to make aware. Thanks  Current Outpatient Medications on File Prior to Visit  Medication Sig Dispense Refill  . albuterol (PROVENTIL HFA;VENTOLIN HFA) 108 (90 BASE) MCG/ACT inhaler Inhale 2 puffs into the lungs every 6 (six) hours as needed. Wheezing     . ascorbic acid (VITAMIN C) 250 MG CHEW Chew 250 mg by mouth daily.     Marland Kitchen atorvastatin (LIPITOR) 20 MG tablet Take 0.5 tablets (10 mg total) by mouth daily. 90 tablet 1  . Calcium Carbonate-Vitamin D (CALCIUM-VITAMIN D) 500-200 MG-UNIT per tablet Take 2 tablets by mouth daily.     . Cholecalciferol (VITAMIN D3) 1000 UNITS CAPS Take 1 capsule by mouth daily.     Marland Kitchen ELIQUIS 2.5 MG TABS tablet TAKE 1 TABLET BY MOUTH TWICE A DAY 180 tablet 1  . ferrous sulfate 325 (65 FE) MG tablet Take 325 mg by mouth daily with breakfast.    . furosemide (LASIX) 20 MG tablet Take 1 tablet (20 mg total) by mouth as needed (swelling). 30 tablet 2  . gabapentin (NEURONTIN) 100 MG capsule Take 1-3 tabs as directed at bedtime 90 capsule 0  . loperamide (LOPERAMIDE A-D) 2 MG tablet Take 2 mg by mouth every 6 (six) hours as needed.    Marland Kitchen omeprazole (PRILOSEC) 20 MG capsule Take 20 mg by mouth daily.     . vitamin B-12 (CYANOCOBALAMIN) 250 MCG tablet Take 250 mcg by mouth daily.     . vitamin E (VITAMIN E) 200 UNIT capsule Take 200 Units by mouth daily.      Current Facility-Administered Medications on File Prior to Visit  Medication Dose Route Frequency Provider Last Rate Last Dose  . ondansetron (ZOFRAN) injection    PRN Ofilia Neas, CRNA   1 mg at 02/08/11 0915    Allergies  Allergen  Reactions  . Sulfonamide Derivatives Nausea And Vomiting    Happened a long time ago, does not remember the other reaction she had.

## 2017-12-05 NOTE — Telephone Encounter (Signed)
Per SN- ok and SN aware. Norvasc 5mg  with refill sent to preferred pharmacy.  Nothing further at this time.

## 2017-12-19 LAB — CUP PACEART REMOTE DEVICE CHECK
Battery Remaining Percentage: 95.5 %
Battery Voltage: 3.02 V
Brady Statistic RV Percent Paced: 99 %
Date Time Interrogation Session: 20190826071615
Lead Channel Impedance Value: 580 Ohm
Lead Channel Pacing Threshold Amplitude: 0.5 V
Lead Channel Setting Pacing Amplitude: 2.5 V
Lead Channel Setting Pacing Pulse Width: 0.4 ms
MDC IDC LEAD IMPLANT DT: 20170727
MDC IDC LEAD LOCATION: 753860
MDC IDC MSMT BATTERY REMAINING LONGEVITY: 130 mo
MDC IDC MSMT LEADCHNL RV PACING THRESHOLD PULSEWIDTH: 0.4 ms
MDC IDC MSMT LEADCHNL RV SENSING INTR AMPL: 12 mV
MDC IDC PG IMPLANT DT: 20170727
MDC IDC SET LEADCHNL RV SENSING SENSITIVITY: 2 mV
Pulse Gen Model: 1272
Pulse Gen Serial Number: 7897007

## 2018-01-02 ENCOUNTER — Other Ambulatory Visit: Payer: Self-pay | Admitting: Cardiology

## 2018-01-02 NOTE — Telephone Encounter (Signed)
Pt last saw Dr Curt Bears on 11/16/17, last labs 08/31/17 Creat 1.05, age 84, weight 53.1kg, based on specified criteria pt is on appropriate dosage of Eliquis 2.5mg  BID.  Will refill rx.

## 2018-01-06 DIAGNOSIS — Z23 Encounter for immunization: Secondary | ICD-10-CM | POA: Diagnosis not present

## 2018-01-29 ENCOUNTER — Other Ambulatory Visit: Payer: Self-pay | Admitting: Pulmonary Disease

## 2018-02-06 DIAGNOSIS — M545 Low back pain: Secondary | ICD-10-CM | POA: Diagnosis not present

## 2018-02-06 DIAGNOSIS — S39012A Strain of muscle, fascia and tendon of lower back, initial encounter: Secondary | ICD-10-CM | POA: Diagnosis not present

## 2018-02-13 ENCOUNTER — Telehealth: Payer: Self-pay | Admitting: Pulmonary Disease

## 2018-02-13 ENCOUNTER — Other Ambulatory Visit: Payer: Self-pay | Admitting: Pulmonary Disease

## 2018-02-13 MED ORDER — TRAMADOL HCL 50 MG PO TABS: 50.0000 mg | ORAL_TABLET | Freq: Three times a day (TID) | ORAL | 0 refills | Status: DC | PRN

## 2018-02-13 NOTE — Telephone Encounter (Signed)
Per SN- Apply heat, Tramadol 50mg , take with Tylenol, #90, take 1 TID for pain.  If not better, then call for ROV next week.  Called and spoke with Patient.  SN recommendations given.  Understanding stated.  Tramadol prescription called in at [facility] [address].  Nothing further at this time.

## 2018-02-13 NOTE — Telephone Encounter (Signed)
Called patient, she stated that she was seen in urgent care over the weekend for back pain. She was told it was a slight pulled muscle and nothing too serious. She was given some pain medication but nothing is helping. Patient would like to speak with SN about this.    SN please advise, thank you.   Current Outpatient Medications on File Prior to Visit  Medication Sig Dispense Refill  . albuterol (PROVENTIL HFA;VENTOLIN HFA) 108 (90 BASE) MCG/ACT inhaler Inhale 2 puffs into the lungs every 6 (six) hours as needed. Wheezing     . amLODipine (NORVASC) 5 MG tablet TAKE 1 TABLET BY MOUTH EVERY DAY 30 tablet 6  . ascorbic acid (VITAMIN C) 250 MG CHEW Chew 250 mg by mouth daily.     Marland Kitchen atorvastatin (LIPITOR) 20 MG tablet Take 0.5 tablets (10 mg total) by mouth daily. 90 tablet 1  . Calcium Carbonate-Vitamin D (CALCIUM-VITAMIN D) 500-200 MG-UNIT per tablet Take 2 tablets by mouth daily.     . Cholecalciferol (VITAMIN D3) 1000 UNITS CAPS Take 1 capsule by mouth daily.     Marland Kitchen ELIQUIS 2.5 MG TABS tablet TAKE 1 TABLET BY MOUTH TWICE A DAY 60 tablet 8  . ferrous sulfate 325 (65 FE) MG tablet Take 325 mg by mouth daily with breakfast.    . furosemide (LASIX) 20 MG tablet Take 1 tablet (20 mg total) by mouth as needed (swelling). 30 tablet 2  . gabapentin (NEURONTIN) 100 MG capsule Take 1-3 tabs as directed at bedtime 90 capsule 0  . loperamide (LOPERAMIDE A-D) 2 MG tablet Take 2 mg by mouth every 6 (six) hours as needed.    Marland Kitchen omeprazole (PRILOSEC) 20 MG capsule Take 20 mg by mouth daily.     . vitamin B-12 (CYANOCOBALAMIN) 250 MCG tablet Take 250 mcg by mouth daily.     . vitamin E (VITAMIN E) 200 UNIT capsule Take 200 Units by mouth daily.      Current Facility-Administered Medications on File Prior to Visit  Medication Dose Route Frequency Provider Last Rate Last Dose  . ondansetron (ZOFRAN) injection    PRN Ofilia Neas, CRNA   1 mg at 02/08/11 0915   Allergies  Allergen Reactions  . Sulfonamide  Derivatives Nausea And Vomiting    Happened a long time ago, does not remember the other reaction she had.

## 2018-02-14 ENCOUNTER — Telehealth: Payer: Self-pay | Admitting: Cardiology

## 2018-02-14 ENCOUNTER — Ambulatory Visit (INDEPENDENT_AMBULATORY_CARE_PROVIDER_SITE_OTHER): Payer: Medicare Other

## 2018-02-14 DIAGNOSIS — I442 Atrioventricular block, complete: Secondary | ICD-10-CM | POA: Diagnosis not present

## 2018-02-14 NOTE — Telephone Encounter (Signed)
LMOVM reminding pt to send remote transmission.   

## 2018-02-15 ENCOUNTER — Encounter: Payer: Self-pay | Admitting: Cardiology

## 2018-02-15 NOTE — Progress Notes (Signed)
Remote pacemaker transmission.   

## 2018-02-17 ENCOUNTER — Encounter: Payer: Self-pay | Admitting: Nurse Practitioner

## 2018-02-17 ENCOUNTER — Ambulatory Visit (INDEPENDENT_AMBULATORY_CARE_PROVIDER_SITE_OTHER): Payer: Medicare Other | Admitting: Nurse Practitioner

## 2018-02-17 VITALS — BP 130/68 | HR 68 | Ht 67.0 in | Wt 109.0 lb

## 2018-02-17 DIAGNOSIS — M545 Low back pain, unspecified: Secondary | ICD-10-CM

## 2018-02-17 MED ORDER — METHYLPREDNISOLONE ACETATE 80 MG/ML IJ SUSP
80.0000 mg | Freq: Once | INTRAMUSCULAR | Status: AC
  Administered 2018-02-17: 80 mg via INTRAMUSCULAR

## 2018-02-17 NOTE — Patient Instructions (Signed)
Will give DepoMedrol in office today Take tramadol as already prescribed by Dr. Lenna Gilford Continue current medications No heavy lifting, pushing, or pulling until feeling better Follow up with Dr. Lenna Gilford early next week Go to the ED this weekend if pain becomes more severe   Back Pain, Adult Back pain is very common. The pain often gets better over time. The cause of back pain is usually not dangerous. Most people can learn to manage their back pain on their own. Follow these instructions at home: Watch your back pain for any changes. The following actions may help to lessen any pain you are feeling:  Stay active. Start with short walks on flat ground if you can. Try to walk farther each day.  Exercise regularly as told by your doctor. Exercise helps your back heal faster. It also helps avoid future injury by keeping your muscles strong and flexible.  Do not sit, drive, or stand in one place for more than 30 minutes.  Do not stay in bed. Resting more than 1-2 days can slow down your recovery.  Be careful when you bend or lift an object. Use good form when lifting: ? Bend at your knees. ? Keep the object close to your body. ? Do not twist.  Sleep on a firm mattress. Lie on your side, and bend your knees. If you lie on your back, put a pillow under your knees.  Take medicines only as told by your doctor.  Put ice on the injured area. ? Put ice in a plastic bag. ? Place a towel between your skin and the bag. ? Leave the ice on for 20 minutes, 2-3 times a day for the first 2-3 days. After that, you can switch between ice and heat packs.  Avoid feeling anxious or stressed. Find good ways to deal with stress, such as exercise.  Maintain a healthy weight. Extra weight puts stress on your back.  Contact a doctor if:  You have pain that does not go away with rest or medicine.  You have worsening pain that goes down into your legs or buttocks.  You have pain that does not get better in  one week.  You have pain at night.  You lose weight.  You have a fever or chills. Get help right away if:  You cannot control when you poop (bowel movement) or pee (urinate).  Your arms or legs feel weak.  Your arms or legs lose feeling (numbness).  You feel sick to your stomach (nauseous) or throw up (vomit).  You have belly (abdominal) pain.  You feel like you may pass out (faint). This information is not intended to replace advice given to you by your health care provider. Make sure you discuss any questions you have with your health care provider. Document Released: 08/25/2007 Document Revised: 08/14/2015 Document Reviewed: 07/10/2013 Elsevier Interactive Patient Education  [employer].

## 2018-02-17 NOTE — Progress Notes (Signed)
@Patient  ID: Hannah Mercado, female    DOB: [birth year], 84 y.o.   MRN: [id number]  Chief Complaint  Patient presents with  . Back Pain    Referring provider: Noralee Space, MD  HPI  84 year old female with post inflammatory pulmonary fibrosis, and asthmatic bronchitis followed by Dr. Lenna Gilford.  Tests: Chest x ray (from urgent care visit) 02/06/18 - scoliosis with multilevel spondylosis. Compression deformities of vertebral bodies.   OV 02/17/18 - back pain Patient presents with low back pain. States that this started a couple of weeks ago. She was seen at urgent care on 02/06/18 and diagnosed with muscle strain. She has tried rest, ice packs, heating pad, ultram (ordered by Dr. Lenna Gilford - see phone note) and tylenol with minimal relief noted. She states that pain has progressively worsened. Reports that the pain is in the bilateral lower back. States that the pain does not radiate. Denies any leg weakness or pain. Denies any recent falls. She states that she thinks she strained her back doing house work. She denies any chest pain, shortness of breath, or swelling. Denies any bruising.       Allergies  Allergen Reactions  . Sulfonamide Derivatives Nausea And Vomiting    Happened a long time ago, does not remember the other reaction she had.    Immunization History  Administered Date(s) Administered  . Influenza Split 01/02/2011, 01/03/2012, 12/20/2012  . Influenza Whole 12/20/2009  . Influenza, High Dose Seasonal PF 01/24/2014, 01/01/2015, 05/24/2016  . Influenza,inj,Quad PF,6+ Mos 01/21/2014, 12/21/2014  . Pneumococcal Conjugate-13 02/25/2014  . Pneumococcal Polysaccharide-23 03/22/2005    Past Medical History:  Diagnosis Date  . Allergy history unknown   . Anxiety   . Asthmatic bronchitis    use inhaler prn   . Atrial fibrillation ([place]) 10/16/2015  . Blood transfusion    ? with ectopic pregnancy 50 yrs ago   . Cardiac pacemaker in situ 02/24/2016  . Carotid  artery disease ([place]) 03/12/2015  . Chronic venous insufficiency 09/11/2015  . Diaphragmatic hernia 07/31/2008   Qualifier: Diagnosis of  By: Lenna Gilford MD, Deborra Medina   . Diverticulosis of colon without hemorrhage 08/02/2012  . DJD (degenerative joint disease)   . Edema 09/11/2015  . GERD (gastroesophageal reflux disease)   . Hiatal hernia   . History of pneumonia    right middle lobe  . Hypercholesteremia   . HYPERCHOLESTEROLEMIA 04/19/2007   Qualifier: Diagnosis of  By: Julien Girt CMA, Marliss Czar    . Hypertension 04/14/2011  . Mitral valve disorder 04/20/2007   Qualifier: Diagnosis of  By: Lenna Gilford MD, Deborra Medina   . Mitral valve prolapse    pt denies   . Osteopenia   . Persistent atrial fibrillation   . PULMONARY FIBROSIS, POSTINFLAMMATORY 07/31/2008   Qualifier: Diagnosis of  By: Lenna Gilford MD, Deborra Medina   . Pulmonary fibrosis, postinflammatory ([place])    pt denies   . Rectal cancer ([place])   . Right carotid bruit 08/28/2014    Tobacco History: Social History   Tobacco Use  Smoking Status Never Smoker  Smokeless Tobacco Never Used   Counseling given: Not Answered   Outpatient Encounter Medications as of 02/17/2018  Medication Sig  . albuterol (PROVENTIL HFA;VENTOLIN HFA) 108 (90 BASE) MCG/ACT inhaler Inhale 2 puffs into the lungs every 6 (six) hours as needed. Wheezing   . amLODipine (NORVASC) 5 MG tablet TAKE 1 TABLET BY MOUTH EVERY DAY  . ascorbic acid (VITAMIN C) 250 MG CHEW Chew 250 mg  by mouth daily.   Marland Kitchen atorvastatin (LIPITOR) 20 MG tablet Take 0.5 tablets (10 mg total) by mouth daily.  . Calcium Carbonate-Vitamin D (CALCIUM-VITAMIN D) 500-200 MG-UNIT per tablet Take 2 tablets by mouth daily.   . Cholecalciferol (VITAMIN D3) 1000 UNITS CAPS Take 1 capsule by mouth daily.   Marland Kitchen ELIQUIS 2.5 MG TABS tablet TAKE 1 TABLET BY MOUTH TWICE A DAY  . ferrous sulfate 325 (65 FE) MG tablet Take 325 mg by mouth daily with breakfast.  . furosemide (LASIX) 20 MG tablet Take 1 tablet (20 mg total) by mouth as needed  (swelling).  . gabapentin (NEURONTIN) 100 MG capsule Take 1-3 tabs as directed at bedtime  . loperamide (LOPERAMIDE A-D) 2 MG tablet Take 2 mg by mouth every 6 (six) hours as needed.  Marland Kitchen omeprazole (PRILOSEC) 20 MG capsule Take 20 mg by mouth daily.   . traMADol (ULTRAM) 50 MG tablet Take 1 tablet (50 mg total) by mouth 3 (three) times daily as needed for moderate pain.  . vitamin B-12 (CYANOCOBALAMIN) 250 MCG tablet Take 250 mcg by mouth daily.   . vitamin E (VITAMIN E) 200 UNIT capsule Take 200 Units by mouth daily.    Facility-Administered Encounter Medications as of 02/17/2018  Medication  . [COMPLETED] methylPREDNISolone acetate (DEPO-MEDROL) injection 80 mg  . ondansetron (ZOFRAN) injection     Review of Systems  Review of Systems  Constitutional: Negative.  Negative for chills and fever.  HENT: Negative.   Respiratory: Negative for cough and shortness of breath.   Cardiovascular: Negative.   Gastrointestinal: Negative.   Musculoskeletal: Positive for back pain (bilateral low back).  Allergic/Immunologic: Negative.   Neurological: Negative.   Psychiatric/Behavioral: Negative.        Physical Exam  BP 130/68 (BP Location: Left Arm, Patient Position: Sitting, Cuff Size: Normal)   Pulse 68   Ht 5\' 7"  (1.702 m)   Wt 109 lb (49.4 kg)   SpO2 95%   BMI 17.07 kg/m   Wt Readings from Last 5 Encounters:  02/17/18 109 lb (49.4 kg)  11/16/17 117 lb (53.1 kg)  08/31/17 122 lb (55.3 kg)  02/23/17 122 lb (55.3 kg)  11/15/16 125 lb 9.6 oz (57 kg)     Physical Exam  Constitutional: She is oriented to person, place, and time. She appears well-developed and well-nourished. No distress.  Cardiovascular: Normal rate and regular rhythm.  Pulmonary/Chest: Effort normal and breath sounds normal.  Musculoskeletal: She exhibits no edema.       Lumbar back: She exhibits tenderness and pain. She exhibits no swelling and no deformity.       Back:  Neurological: She is alert and  oriented to person, place, and time.  Psychiatric: She has a normal mood and affect.  Nursing note and vitals reviewed.      Assessment & Plan:   Acute bilateral low back pain Patient Instructions  Will give DepoMedrol in office today Take tramadol as already prescribed by Dr. Lenna Gilford Continue current medications No heavy lifting, pushing, or pulling until feeling better Follow up with Dr. Lenna Gilford early next week Go to the ED this weekend if pain becomes more severe   Back Pain, Adult Back pain is very common. The pain often gets better over time. The cause of back pain is usually not dangerous. Most people can learn to manage their back pain on their own. Follow these instructions at home: Watch your back pain for any changes. The following actions may help to lessen  any pain you are feeling:  Stay active. Start with short walks on flat ground if you can. Try to walk farther each day.  Exercise regularly as told by your doctor. Exercise helps your back heal faster. It also helps avoid future injury by keeping your muscles strong and flexible.  Do not sit, drive, or stand in one place for more than 30 minutes.  Do not stay in bed. Resting more than 1-2 days can slow down your recovery.  Be careful when you bend or lift an object. Use good form when lifting: ? Bend at your knees. ? Keep the object close to your body. ? Do not twist.  Sleep on a firm mattress. Lie on your side, and bend your knees. If you lie on your back, put a pillow under your knees.  Take medicines only as told by your doctor.  Put ice on the injured area. ? Put ice in a plastic bag. ? Place a towel between your skin and the bag. ? Leave the ice on for 20 minutes, 2-3 times a day for the first 2-3 days. After that, you can switch between ice and heat packs.  Avoid feeling anxious or stressed. Find good ways to deal with stress, such as exercise.  Maintain a healthy weight. Extra weight puts stress on  your back.  Contact a doctor if:  You have pain that does not go away with rest or medicine.  You have worsening pain that goes down into your legs or buttocks.  You have pain that does not get better in one week.  You have pain at night.  You lose weight.  You have a fever or chills. Get help right away if:  You cannot control when you poop (bowel movement) or pee (urinate).  Your arms or legs feel weak.  Your arms or legs lose feeling (numbness).  You feel sick to your stomach (nauseous) or throw up (vomit).  You have belly (abdominal) pain.  You feel like you may pass out (faint). This information is not intended to replace advice given to you by your health care provider. Make sure you discuss any questions you have with your health care provider. Document Released: 08/25/2007 Document Revised: 08/14/2015 Document Reviewed: 07/10/2013 Elsevier Interactive Patient Education  2018 [place], [place] 02/20/2018

## 2018-02-20 ENCOUNTER — Telehealth: Payer: Self-pay | Admitting: Pulmonary Disease

## 2018-02-20 ENCOUNTER — Encounter: Payer: Self-pay | Admitting: Nurse Practitioner

## 2018-02-20 DIAGNOSIS — I272 Pulmonary hypertension, unspecified: Secondary | ICD-10-CM | POA: Insufficient documentation

## 2018-02-20 NOTE — Assessment & Plan Note (Signed)
Patient Instructions  Will give DepoMedrol in office today Take tramadol as already prescribed by Dr. Lenna Gilford Continue current medications No heavy lifting, pushing, or pulling until feeling better Follow up with Dr. Lenna Gilford early next week Go to the ED this weekend if pain becomes more severe   Back Pain, Adult Back pain is very common. The pain often gets better over time. The cause of back pain is usually not dangerous. Most people can learn to manage their back pain on their own. Follow these instructions at home: Watch your back pain for any changes. The following actions may help to lessen any pain you are feeling:  Stay active. Start with short walks on flat ground if you can. Try to walk farther each day.  Exercise regularly as told by your doctor. Exercise helps your back heal faster. It also helps avoid future injury by keeping your muscles strong and flexible.  Do not sit, drive, or stand in one place for more than 30 minutes.  Do not stay in bed. Resting more than 1-2 days can slow down your recovery.  Be careful when you bend or lift an object. Use good form when lifting: ? Bend at your knees. ? Keep the object close to your body. ? Do not twist.  Sleep on a firm mattress. Lie on your side, and bend your knees. If you lie on your back, put a pillow under your knees.  Take medicines only as told by your doctor.  Put ice on the injured area. ? Put ice in a plastic bag. ? Place a towel between your skin and the bag. ? Leave the ice on for 20 minutes, 2-3 times a day for the first 2-3 days. After that, you can switch between ice and heat packs.  Avoid feeling anxious or stressed. Find good ways to deal with stress, such as exercise.  Maintain a healthy weight. Extra weight puts stress on your back.  Contact a doctor if:  You have pain that does not go away with rest or medicine.  You have worsening pain that goes down into your legs or buttocks.  You have pain that  does not get better in one week.  You have pain at night.  You lose weight.  You have a fever or chills. Get help right away if:  You cannot control when you poop (bowel movement) or pee (urinate).  Your arms or legs feel weak.  Your arms or legs lose feeling (numbness).  You feel sick to your stomach (nauseous) or throw up (vomit).  You have belly (abdominal) pain.  You feel like you may pass out (faint). This information is not intended to replace advice given to you by your health care provider. Make sure you discuss any questions you have with your health care provider. Document Released: 08/25/2007 Document Revised: 08/14/2015 Document Reviewed: 07/10/2013 Elsevier Interactive Patient Education  [employer].

## 2018-02-20 NOTE — Telephone Encounter (Signed)
Per SN- She needs rest,heat, and take Tramadol with Tylenol, three times per day.  She is to keep her appointment with SN, 02/21/18, at 1200.  Called and spoke with Patient.  SN recommendations given.  Understanding stated.  Nothing further at this time.

## 2018-02-20 NOTE — Progress Notes (Signed)
Subjective:    Patient ID: Hannah Mercado, female    DOB: 1922-09-19, 84 y.o.   MRN: [id number]  HPI 84 y/o WF here for a follow up visit... ~  SEE PREV EPIC NOTES FOR OLDER DATA >>     2DEcho 2/04 showing MVP, mild MR, normal LVF... StressEcho w/ EF=60%... Cardiolite 12/03 was normal w/o ischemia and EF=65%.  Baseline EKG 02/08/11>  NSR, rate60, 1st degree AVB w/ PR=.22  CXR 11/13 showed borderline cardiomeg, ectasia of the Ao, chronic lung changes/ NAD, DJD/ spondylosis...  LABS 11/13:  Chems- wnl;  CBC- wnl;  UA- ok...   LABS 5/14:  FLP- at goals on Lip20;  Chems- wnl;  CBC- wnl;  TSH=2.61;  VitD=56;  UA- few wbc, no bact...  CXR => done 6/15>  Borderline cardiomeg, prominent PAs, calcif Ao arch, COPD w/ sl incr markings/ NAD, DJD in Tspine...  LABS 5/15:  FLP- within parameters on Lip20;  Chems- wnl;  CBC- ok w/ Hg=12.6;  TSH=2.41;  VitD=55 on 1000u daily...  BMD => done 6/15>  Lowest Tscore is -2.6 in Lumbar spine; she is rec to continue ALENDRONATE 70mg /wk & calcium/ MVI/ VitD supplements...   ~  August 28, 2014:  97mo ROV & Tywanna will soon be 24!  Planning a trip to [place] to visit daugh in Aug;  She does crosswords everyday!  She reports doing satis, no new complaints or concerns- taking nutritional supplements & wt is up a few lbs...    Hx AB, pulm fibrosis> on ProventilHFA prn (rarely uses); no resp symptoms & breathing good, & she denies cough, phlegm, SOB, etc; CXR w/o acute changes...    MVP, HBP> on ASA81 & Amlod2.5; BP= 136/80 & similar at home; stable w/ msc & gr1/6 SEM; no CP, palpit, ch in SOB, edema, etc...    Right carotid bruit> no cerebral ischemic symptoms; CDoppler 6/16 showed 40-59% RICA stensis, Rx ASA...    Chol> remains on Lip20 & FLP 6/16 looks great w/ TChol 187, TG 83, HDL 93, LDL 78    HH, GERD> on Prilosec20mg /d + antireflux regimen in view of her HH etc; she denies abd pain, n/v, notes intermit diarrhea, no blood seen...    Hx rectal cancer> s/p  lap low anterior resection 11/12 by DrByerly; f/u colonoscopy 11/13 by DrGessner- mod divertics, anastomosis ok; she sees DrByerly Q6-42mo- notes reviewed still w/ occas loose stool (uses Immodium prn); on Align & Activia; she's avoiding lactose & gas is improved, improved after nutrition consult;  CEA level 2/15 = 3.2 (stable); CEA 2/16 by DrByerly & we don't have report.    DJD, Osteopenia> on Fosamax70/wk along w/ Ca, MVI, VitD; last BMD 6/15 w/ lowest Tscore-2.6 (continue same rx), she is getting along well but hasn't played much golf since her surg...    Other problems as noted... She likes to take a number of supplements... We reviewed prob list, meds, xrays and labs> see below for updates >>   CXR 6/16 showed borderline heart size, hyperinflated lungs, icr markings bilat, NAD/ no change...  LABS 6/16:  FLP- at goals on Atorva20;  Chems- wnl;  CBC- wnl;  TSH=3.86;  UA- clear... NOTE> CEA done 2/16 by DrByerly & we do not have results.  CDopplers 6/16 showed irreg mixed plaque bilat, 40-59% RICAstenosis & 0-53% LICAstenosis, norm subclav & antegrade vertebrals..The current medical regimen is effective;  continue present plan and medications. ASA daily.  ~  March 12, 2015:  58mo ROV & Hannah Mercado turned  89 this past summer- surprise party by her 2 children w/ 40 people & she had a blast; she is going to family on [place] for 44mo over the holidays; clinically doing very well- feels good, still does crosswords daily, no new complaints or concerns... She is reminded to drink 1-2 can Ensure/ Sustacal/ Boost daily to help her gain wt...     She has a ProairHFA inhaler but hasn't needed it in yrs she says; breathing stable w/ her chr lung dis- Hx AB & pulm fibrosis, she denies much cough/ sput/ CP/ SOB...     BP & MVP remain stable on low dose Amlodipine 2.5mg /d; BP= 128/80 & she remains relatively asymptomatic; we reviewed exercise program...     Chol is treated w/ Lip20 & last FLP 6/16 looked good w/ TChol  187, TG 83, HDL 93, LDL 78; she has lost wt down to 123# w/ BMI=20 5 encouraged to eat more + supplement...    Hx adenoca of rectum, s/p surg 11/12- followed by DrByerly & DrGessner, stable & no known recurrence...    Hx DJD, osteopenia on Fosamax, VitD, MVI, & OTC analgesics as needed...    Nocturnal leg cramps> she thinks neuropathy but tonic water & prev Quinine rx helped...  We reviewed prob list, meds, xrays and labs>  IMP/PLAN>>  Hannah Mercado is doing remarkably well at age 50;  We discussed continuing the same meds, and incr nutrtitional supplements;  We will plan ROV w/ CXR & Labs in 7mo...  ~  September 11, 2015:  31mo ROV & pulmonary/ medical follow up visit>  Hannah Mercado reports a good 77mo interval but notes LE edema x 3-4wks;  She has gained 9# from her baseline 7mo ago (123#) to 132# today assoc w/ 2+pitting edema in both legs which is new; she denies dietary sodium excess or prev hx of edema=> see below as we review the following medical problems during today's office visit >>     Hx AB, pulm fibrosis> on ProventilHFA prn (rarely uses); no resp symptoms & breathing good, & she denies cough, phlegm, SOB, etc; CXR today shows cardiomeg & interstitial edema...    MVP, HBP> on ASA81 & Amlod2.5; BP= 138/70 but pulse is slow today w/ HR ~40 & she denies CP, palpit, dizzy/ syncope/ near-syncope, change in DOE etc...    Cardiac arrhythmia> new finding on today's EKG=> ?Flutter w/ slow VR, rate40; she has the new edema, interstitial changes on CXR & BNP=857 => referred to [facility] ASAP for eval...    Right carotid bruit> no cerebral ischemic symptoms; CDoppler 6/17 shows no change in 40-59% RICA stenosis, Rx ASA...    Chol> remains on Lip20 & FLP 6/17 looks great w/ TChol 148, TG 75, HDL 72, LDL 61    HH, GERD> on Prilosec20mg /d + antireflux regimen in view of her HH etc; she denies abd pain, n/v, notes intermit diarrhea, no blood seen...    Hx rectal cancer> s/p lap low anterior resection 11/12 by DrByerly; f/u  colonoscopy 11/13 by DrGessner- mod divertics, anastomosis ok; she sees DrByerly Q30mo- notes reviewed still w/ occas loose stool (uses Immodium prn); on Align & Activia; she's avoiding lactose & gas is improved, improved after nutrition consult;  CEA level 2/15 = 3.2 (stable); CEA 2/16 & 2/17 by DrByerly & we don't have report.    DJD, Osteopenia> on Fosamax70/wk along w/ Ca, MVI, VitD; last BMD 6/15 w/ lowest Tscore-2.6 (continue same rx), she is getting along well but hasn't played much  golf since her surg...    Other problems as noted... She likes to take a number of supplements... EXAM shows Afeb, VSS x pulse=40; O2sat=96% on RA; Wt=132#, BMI=15;  HEENT- neg, mallampati1, R carotid bruit;  Chest- decr BS w/ few scat rhonchi, bibasilar rales- unchanged;  Heart- brady rate ~40, gr 1-2/6 SEM w/o r/g;  Abd- thin soft, neg;  Ext- VI, 2+edema in LEs w/o c/c;  Neuro- intact...  CDoppler 09/04/15>  Heterogeneous plaque bilat, stable 40-595 RICA stenosis & 6-96% LICA stenosis, subclav & vertebrals ok...  CXR 09/11/15>  Mod cardiomegaly, incr interstitial markings suggesting poss edema, increased markings at bases...  EKG 09/11/15>  Bradycardia, rate ~40, ?AFlutter, poor R progression V1-2...  LABS 09/11/15>  FLP- all parameters at goals on Lip20;  Chems- wnl w/ Cr=0.94, BS=112, BNP=857;  CBC- wnl;  TSH=4.80;  VitD=60;  UA- clear...  IMP/PLAN>>  While still essentially asymptomatic at age 61, Marisel has developed 2+pitting edema in LEs over the last 3weeks or so & has a bradycardia on EKG w/ ?Flutter & slow VR, only on Amlod2.5, BNP is elev at 857=> we will start Lasix20 & refer for Cards eval ASAP.    ~  February 24, 2016:  6 month [place] had Pacer placed in July- [place] for [facility]> most recent f/u 01/2016-- hx AFib/Flutter, low HR from CHB=> pacer placed 10/16/15, doing satis, planning trip to [place] for [place]... On Eliquis, no further AFib on device check... We reviewed the following medical  problems during today's office visit >>     Hx AB, pulm fibrosis> on ProventilHFA prn (rarely uses); no resp symptoms & breathing good, & she denies cough, phlegm, SOB, etc; CXR today shows cardiomeg & interstitial edema...    MVP, HBP> on Amlod2.5 & Lasix20; BP= 124/70 but pulse is slow today w/ HR ~40 & she denies CP, palpit, dizzy/ syncope/ near-syncope, change in DOE etc...    Cardiac arrhythmia=> Fib/Flutter & CHB=>Pacer> new finding 08/2015=> ?Flutter w/ slow VR, rate40; she has the new edema, interstitial changes on CXR & BNP=857 => referred to [facility]    Right carotid bruit> no cerebral ischemic symptoms; CDoppler 6/17 shows no change in 40-59% RICA stenosis, Rx ASA...    Chol> remains on Lip20 & FLP 6/17 looks great w/ TChol 148, TG 75, HDL 72, LDL 61    HH, GERD> on Prilosec20mg /d + antireflux regimen in view of her HH etc; she denies abd pain, n/v, notes intermit diarrhea, no blood seen...    Hx rectal cancer> s/p lap low anterior resection 11/12 by DrByerly; f/u colonoscopy 11/13 by DrGessner- mod divertics, anastomosis ok; she sees DrByerly Q30mo- notes reviewed still w/ occas loose stool (uses Immodium prn); on Align & Activia; she's avoiding lactose & gas is improved, improved after nutrition consult;  CEA level 2/15 = 3.2 (stable); CEA 2/16 & 2/17 by DrByerly & we don't have report.    DJD, Osteopenia> on Fosamax70/wk along w/ Ca, MVI, VitD; last BMD 6/15 w/ lowest Tscore-2.6 (continue same rx), she is getting along well but hasn't played much golf since her surg...    Other problems as noted... She likes to take a number of supplements... EXAM shows Afeb, VSS; O2sat=97% on RA; Wt=122#, BMI=15;  HEENT- neg, mallampati1, R carotid bruit;  Chest- decr BS w/ few scat rhonchi, bibasilar rales- unchanged;  Heart- RR rate70, gr 1-2/6 SEM w/o r/g;  Abd- thin soft, neg;  Ext- VI, +tr edema in LEs w/o c/c;  Neuro- intact... IMP/PLAN>>  Aftin is stable & looking forward to Xmas in [place] w/  family; continue same meds and follow up w/ [place] & [facility]...  ~  August 24, 2016:  4mo ROV & Hazeline reports a good interval- she notes sl wobbly on occas & wonders about her Lasix but BP is good & edema resolved;  She reports that she's going to daugh's in [place] for 11mo soon... we reviewed the following medical problems during today's office visit >>     Hx AB, pulm fibrosis> on ProventilHFA prn (rarely uses); no resp symptoms & breathing good, & she denies cough, phlegm, SOB, etc; CXR today shows cardiomeg & interstitial edema...    MVP, HBP> on Amlod5 & Lasix20; BP= 124/70 but pulse is slow today w/ HR ~40 & she denies CP, palpit, dizzy/ syncope/ near-syncope, change in DOE etc...    Cardiac arrhythmia=> Fib/Flutter & CHB=>Pacer> new finding 08/2015=> hx AFib/Flutter, low HR from CHB=> pacer placed 10/16/15, on Eliquis2.5Bid, no further AFib on device checks...    Right carotid bruit> on ASA81; no cerebral ischemic symptoms; CDoppler 6/17 shows no change in 40-59% RICA stenosis, but f/u CDopplers 6/18 showed bilat 1-39% ICAstenoses, continue same...    Chol> remains on Lip20 & FLP 6/18 looks great w/ TChol 183, TG 101, HDL 83, LDL 80... continue same.    HH, GERD> on Prilosec20mg /d + antireflux regimen in view of her [place] etc; she denies abd pain, n/v, notes intermit diarrhea, no blood seen...    Hx rectal cancer> s/p lap low anterior resection 11/12 by DrByerly; f/u colonoscopy 11/13 by DrGessner- mod divertics, anastomosis ok; she sees DrByerly Q64mo- notes reviewed still w/ occas loose stool (uses Immodium prn); on Align & Activia; she's avoiding lactose & gas is improved, improved after nutrition consult;  CEA levels have been in the 3's but f/u 08/24/16= 4.6 & we will recheck in 22mo.    Mild renal insuffic>  Prev renal function wnl (0.94-1.08) and labs 6/18 showed Cr=1.42 on Lasix20/d... OK to change to "prn edema" & reminded to incr water intake.    DJD, Osteopenia> on Fosamax70/wk along w/ Ca, MVI, VitD; BMD  recheck 6/18 showed lowest Tscore-2.5 & she is in need of a drug holiday starting now- she is getting along well but hasn't played much golf since her surg...    Other problems as noted... She likes to take a number of supplements... EXAM shows Afeb, VSS; O2sat=98% on RA; Wt=124#, BMI=15;  HEENT- neg, mallampati1, R carotid bruit;  Chest- decr BS w/ few scat rhonchi, bibasilar rales- unchanged;  Heart- RR rate70, gr 1-2/6 SEM w/o r/g;  Abd- thin soft, neg;  Ext- VI, +tr edema in LEs w/o c/c;  Neuro- intact...  CXR 08/24/16>  Cardiomeg, pacer, overinflation/ COPD, bibasilar scarring, NAD...   LABS 08/24/16>  FLP- at goals on Atorva20;  Chems- ok x BS=117, Cr=1.42;  CBC- wnl w/ Hg=13.5;  Stool neg;  TSH=2.61;  CEA=4.6 (up from 3.2);  VitD=55;  UA shows UTI w/ +leukocytes/ nitrite pos/ +bact=> Rx w/ CIPRO  Bone Density 09/01/16>  Lowest Tscore is -2.5 in Lumbar spine, and -2.4 in RFN... She has been on Bisphos Rx for yrs & in need of a drug holiday-- Rec to stop after current supply gone & continue calcium, VitD, wt bearing exercise; we will plan f/u BMD at the 68yr interval...   CDopplers 09/09/16 showed heterogen plaque, bilat 1-39% ICA stenoses, >50% RECA stenosis, normal subclav &vertebrals... Stable to improved on  UMP53- continue same... IMP/PLAN>>  OK for Elaysia to try off the Lasix20/d to "prn" for edema;  UTI treated w/ Cipro250Bid x 7d;  BMD is sl improved & she is in need of a bisphos drug holiday- therefore finish to current supply & then stop the Fosamax but continue dietary calcium, vit D supplement and wt bearing exercise;  DrByerly wanted her to have stool cards=> neg for blood, but f/u CEA is 4.6 => rec to recheck in 39mo...  ~  February 23, 2017:  48mo ROV & Hannah Mercado indicates that "I've got a little hitch in my get-a-long" meaning that she has some right hip pain but notes that tylenol & "rapid release gel" really help & she is not inclined toward further eval- asked to watch this closely & let me  know if she needs further eval w/ XRays, Ortho, etc...  She had a great Thanksgiving in [place] w/ family;  Her recommendations for doing well at 89=> eat right, exercise daily, don't watch TV all day long!  She lives alone in her own townhouse & cares for it by herself, including her 5 christmas trees (recent article in [id number])...    She saw [facility]/EP- DrCamnitz on 11/15/16>  Hx HBP, AFib/Flutter, pacer placed7/2017 for CHB, HL-- doing satis in AFib, on Amlod5, Eliquis2.5Bid, pacer functioning normally, Lasix20 prn, no changes made...  We reviewed the following medical problems during today's office visit>      AB, postinflamm pulm fibrosis> has AlbutHFA for prn use, CXR w/ cardiomeg & bibasilar scarring, stable...    Cards- HBP, AFib/Flutter, Hx CHB w/ pacer; followed by DrCamnitz as above...    Hx R-CBruit> on ASA81, CDopplers w/ 40-59% RICA stenosis but f/u CDopplers 6/18 showed bilat 1-39% ICA stenoses and >50% RECA stenosis...    HL> on Lip20 taking 1/2 tab daily w/ good FLP 6/18...    Underweight> wt is down 2# to 122# today & BMI=15, we discussed nutrittional supplements...    Hx rectal cancer- s/p low ant resection 11/12 by DrByerly; slowly rising CEA level=> 3's thru 2015, 4.6 in Jun2018, 6.2 in Dec2018=> CT Abd&Pelvis ordered (see below)    Mild renal insuffic?> Cr= 1.42 in Jun2018 on Lasix20 for edema, and now 0.91 on Lasix prn...    DJD/ osteoporosis> on calcium, MVI, VitD and Fosamax70/wk; last BMD 6/18 showed Tscore -2.5 & we started a drug holiday at that time...  EXAM shows Afeb, VSS; O2sat=97% on RA; Wt=122#, BMI=15;  HEENT- neg, mallampati1, R carotid bruit;  Chest- decr BS w/ few scat rhonchi, bibasilar rales- unchanged;  Heart- RR rate70, gr 1-2/6 SEM w/o r/g;  Abd- thin soft, neg;  Ext- VI, +tr edema in LEs w/o c/c;  Neuro- intact...  LABS 02/23/18>  Chems- ok w/ Cr=0.91;  CEA- 6.2  CT Abd&Pelvis 03/08/17 >> Cardiomeg w/ coronary & aortic calcif; sigmoid diverticulosis, low  rectal anastomotic staple line w/o evid of recurrent tumor, small retroperitoneal LNs (not pathologically enlarged), bilat renal cysts & bladder diverticulum, aorto-iliac atherosclerotic vasc dis & narrowing of the prox celiac trunk & SMA (renal arts are ok)... No evid for recurrent colorectal cancer... IMP/PLAN>>  Hannah Mercado is stable clinically but the slowly rising CEA is a concern=> NEG CT Abd&Pelvis is reassuring but we will continue to follow;  She is 84 y/o but fiercely independent & actually doing quite well...     ~  August 31, 2017:  68mo ROV & Savita reports that her breathing is good, she denies cough/ sput/ SOB;  she denies CP/ palpit (she is in chr AFib)/ edema; her CC is that her legs sting & burn c/w neuropathy and we discussed a trial of Gabapentin for this... No interval Epic medical notes over the last 6 months...  We reviewed the following medical problems during today's office visit>      AB, postinflamm pulm fibrosis> has AlbutHFA for prn use, CXR w/ cardiomeg & bibasilar scarring, stable...    PulmHTN> 2DEcho 08/2015 w/ PAsys= 9mmHg; 2DEcho 08/2017 w/ PAsys= 83mmHg; etiology is multifactorial...    Cards- HBP, AFib/Flutter, Hx CHB w/ pacer; followed by DrCamnitz as above...    Hx R-CBruit> on ASA81, CDopplers w/ 40-59% RICA stenosis but f/u CDopplers 6/18 showed bilat 1-39% ICA stenoses and >50% RECA stenosis...    HL> on Lip20 taking 1/2 tab daily w/ good FLP 6/18 & 6/19 (TChol 176, TG 81, HDL 85, LDL 76)...    Underweight> wt is stable at 122# today & BMI=15, we discussed nutritional supplements...    Hx rectal cancer- s/p low ant resection 11/12 by DrByerly; slowly rising CEA level=> 3's thru 2015, 4.6 in Jun2018, 6.2 in Dec2018=> CT Abd&Pelvis 02/2017 w/o evid of recurrent cancer...    Mild renal insuffic?> Cr= 1.42 in Jun2018 on Lasix20 for edema, and now improved to 1.05 on Lasix prn...    DJD/ osteoporosis/ compression fxs> on calcium, MVI, VitD and Fosamax70/wk; last BMD 6/18  showed Tscore -2.5 (sl improved) & we started a drug holiday at that time; NOTE: CT Abd 02/2017 also showed new interval 70% vertebral body compression fracture at L4 with vertebral sclerosis and 10 mm of posterior bony rim retropulsion contributing to mild impingement at the L4-5 level, & there is also a chronic 30% superior endplate compression fracture at L2. EXAM shows Afeb, VSS; O2sat=97% on RA; Wt=122#, BMI=15;  HEENT- neg, mallampati1, R carotid bruit;  Chest- decr BS w/ few scat rhonchi, bibasilar rales- unchanged;  Heart- RR rate70, gr 1-2/6 SEM w/o r/g;  Abd- thin soft, neg;  Ext- VI, +tr edema in LEs w/o c/c;  Neuro- c/o neuropathy symptoms in legs but not c/o any signif LBP...  Ambulatory oximetry 08/31/17>  O2sat=100% on RA at rest w/ pulse=88/min;  She ambulated 3 laps in office (185'each) w/ lowest O2sat=97% w/ pulse=79/min  LABS 08/31/17>  FLP- all parameters at goals on Atorv10;  Chems- ok x K=5.6, BS=116, Cr=1.05, LFTs wnl;  CBC- wnl w/ Hg=14, WBC=6.6;  TSH=3.87;  Pro-BNP=532 (norm=<100);  CEA=5.7 (down from 6.2 prev)...   2DEcho 09/02/17>  Pt in AFib, norm LVF w/ EF=55-60%, norm wall motion, triv AI/ no stenosis, mild bileaflet MVprolapse w/ triv MR, severe LA dil (66mm), norm RV, RA severely dilated, PAsys=66 mmHg  ONO on RA 08/2017>  Reported to show O2 desat to 77% & we set her up w/ Home O2 at 2L/min Qhs for the nocturnal hypoxemia & her pulmHTN...  IMP/PLAN>>  Hannah Mercado remains very stoic- only c/o some LE neuropathic symptoms and denies resp problems, SOB, etc;  We have prescribed Gabapentin 100mg => 300mg  Qhs and she is going to start on O2 at 2L/min Qhs; labs are ok & her CEA is NOT progressive; concern for her osteoporosis & compression fxs + spondylosis changes...           Problem List:   pt's cell # [phone]  ALLERGY (ICD-995.3) - uses OTC meds as needed.  ASTHMATIC BRONCHITIS, ACUTE (ICD-466.0) - uses PROAIR as needed, but doing well, breathing OK. ~  7/09:  AB exac  Rx w/ Pred, Avelox, Mucinex, etc... symptoms resolved. ~  CXR: baseline film w/ mild cardiomeg, scarring, chr changes, HH seen, & NAD.Marland Kitchen. ~  f/u CXR 6/11 & 7/12>  no change... ~  CXR 11/13 showed borderline cardiomeg, ectasia of the Ao, chronic lung changes/ NAD, DJD/ spondylosis..  ~  CXR 6/15>  Borderline cardiomeg, prominent PAs, calcif Ao arch, COPD w/ sl incr markings/ NAD, DJD in Tspine ~  CXR 6/16 showed borderline heart size, hyperinflated lungs, icr markings bilat, NAD/ no change ~  CXR 6/17 showed mod cardiomegaly, incr interstitial markings suggesting poss edema, increased markings at bases... ~  CXR 6/18 showed cardiomeg, pacer, overinflation/ COPD, bibasilar scarring, NAD...   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 exam showed bibasilar velcro rales at bases...  HYPERTENSION >> on NORVASC 5mg - 1/2 tab daily... ~  1/13: BP= 160/80 & we decided to start Norvasc5mg /d... subeq cut down to 1/2 tab per phone message. ~  3/13: BP= 154/84 & repeat 124/74; she feels well & denies CP, palpit, SOB, edema, etc; continue same... ~  5/13:  BP= 150/68 & she remains asymptomatic &in good spirits; ok to continue same for now, monitor BP at home. ~  11/13:  BP= 122/70 & she continues to gain strength slowly... ~  11/14: on Amlod5-1/2 daily; BP= 116.62 & she denies CP, palpit, SOB, edema... ~  5/15: on ASA81 & Amlod2.5; BP= 124/70 & similar at home; stable w/ msc & gr1/6 SEM; no CP, palpit, ch in SOB, edema, etc ~  12/15: on ASA81 & Amlod2.5; BP= 122/62 and stable, doing satis... ~  6/16: on ASA81 & Amlod2.5; BP= 136/80 & similar at home; stable w/ msc & gr1/6 SEM; no CP, palpit, ch in SOB, edema, etc ~  6/17: on ASA81 & Amlod2.5; BP= 138/70 but pulse is slow today w/ HR ~40 & she denies CP, palpit, dizzy/ syncope/ near-syncope, change in DOE etc... ~  6/18 on ASA81 & Amlod5 & Lasix20;  BP= 132/74 & she denies CP, palpit, SOB,  edema...  MITRAL VALVE PROLAPSE >> Atrial Fib/ Flutter Complete heart block => PACER 09/2015 ~  had 2DEcho 2/04 showing MVP, mild MR, normal LVF... StressEcho w/ EF=60%... subseq Cardiolite 12/03 was normal w/o ischemia and EF=65%... denies CP, palpit, dizzy, syncope, edema, etc; she is gradually increasing activities post op... ~  EKG 11/12 showed NSR, rate60, sl incr voltage, NAD... ~  6/17> Cardiac arrhythmia> new finding on today's EKG=> ?Flutter w/ slow VR, rate40; she has the new edema, interstitial changes on CXR & BNP=857 => referred to [facility] ASAP for eval... ~  09/2015> Pacer placed by [facility] for [facility]; subseq device checks w/o recurrent AFib, pt on Eliquis... ~  08/2016> on Eliquis2.5Bid & stable...  Right Carotid Bruit>  no cerebral ischemic symptoms; CDoppler 6/16 showed 40-59% RICA stenosis, Rx ASA. ~  no cerebral ischemic symptoms; CDoppler 6/17 shows no change in 40-59% RICA stenosis, Rx ASA... ~  CDoppler 08/2016>  heterogen plaque, bilat 1-39% ICA stenoses, >50% RECA stenosis, normal subclav &vertebrals... Stable to improved on ASA81- continue same...  HYPERCHOLESTEROLEMIA (ICD-272.0) - controlled on LIPITOR 20mg /d,  and tol well...  ~  labs 7/08 showed TChol 183, TG 111, HDL 65, LDL 96... Doing well, continue Lip20. ~  FLP 11/09 showed TChol 189, TG 70, HDL 71, LDL 104 ~  FLP 5/10 showed TChol 173, TG 53, HDL 75, LDL 87... Remains  stable on Lip20. ~  FLP 6/11 showed TChol 183, TG 103, HDL 74, LDL 88 ~  FLP 7/12 showed TChol 182, TG 127, HDL 74, LDL 82... Continues stable on Lip20. ~  FLP 5/13 on Lip20 showed TChol 167, TG 79, HDL 82, LDL 69... Continue same. ~  FLP 5/14 on Lip20 showed TChol 174, TG 64, HDL 78, LDL 83 ~  FLP 5/15 on Lip20 showed TChol 184, TG 84, HDL 83, LDL 84 ~  FLP 6/16 on Lip20 showed TChol 187, TG 83, HDL 93, LDL 78 ~  FLP 6/17 on Lip20 showed TChol 148, TG 75, HDL 72, LDL 61 ~  FLP 6/18 on Lip20 showed TChol 183, TG 101, HDL 83, LDL 80   HIATAL HERNIA  (ICD-553.3) & GERD (ICD-530.81) - she has a HH seen on CXR and CT Chest... takes OMEPRAZOLE 20mg /d and denies heartburn, GERD, regurg, dysphagia, etc... pt never had EGD or Colonoscopy> she had repeatedly refused to sched a colonoscopy! ~  Q6mo ROVs>  she continues to deny reflux symptoms, abd pain, N/V, etc>  ADENOCARCINOMA of the RECTUM >> Dx 9/12 via colonoscopy for hematochezia & 11/12 underwent laparoscopic assisted low anterior resection by DrByerly CCS & left ovarian cystectomy (benign) by Samuel Jester; final path = T2 N0 mod diff adenocarcinoma invading into but not through the muscularis, no lymphatic invasion, 12 neg LNs, clear margins... ~  6/13:  She continues to f/u w/ DrByerly; c/o some loose stools, otherw ok... ~  11/13: she had f/u appt w/ DrGessner- Colonoscopy 11/13 showed severe divertics on left, mod on right, clean anastomosis... ~  She continues to f/u w/ DrByerly, [facility] (last visit 2/16)> doing satis, just occas bouts of diarrhea & uses Immodium as needed... They did CEA level but we don't have report. ~  She continues to f/u w/ DrByerly yearly- we checked stool cards 6/18- NEG; we did CEA 6/18 => 4.6 & we will recheck in 33mo  RECURRENT UTIs>  Routine urinalysis w/ WBC, Bact, Leukocyte+, etc... Treated w/ Cipro as required.  DEGENERATIVE JOINT DISEASE (ICD-715.90) - mild in fingers, no other complaints x "I can't make quick turns anymore"... she notes the Tonic Water Qhs works great for her nightime cramps, & on NEURONTIN 100mg - 2Qhs for ?neuropathy symptoms?  OSTEOPENIA (ICD-733.90) - BMDs here in 2003, 2005, 2007- showed osteopenia/ osteoporosis w/ TScores -1.9 to -2.5.Marland KitchenMarland Kitchen on FOSAMAX, Ca++, Vits... ~  labs 11/09 showed Vit D level = 28... rec to take OTC supplement 1000 u daily. ~  BMD here 7/11 showed TScores -2.3 in Lumbar Spine, and -2.3 in right FemNeck... continue Rx. ~  Labs 5/13 showed Vit D level = 54... rec to continue her 1000u supplement... ~  BMD 6/15>  Lowest Tscore  is -2.6 in Lumbar spine; she is rec to continue ALENDRONATE 70mg /wk & calcium/ MVI/ VitD supplements. ~  BMD 6/18>  Lowest Tscore is -2.5 in Lumbar spine, and -2.4 in RFN;  She is in need of bisphos drug holday=> stop now for the next 28yr cycle & recheck BMD in June 2020 ~  CT Abd 02/2017 showed new interval 70% vertebral body compression fracture at L4 with vertebral sclerosis and 10 mm of posterior bony rim retropulsion contributing to mild impingement at the L4-5 level, & there is also a chronic 30% superior endplate compression fracture at L2.  ANXIETY (ICD-300.00)  Health Maintenance - GYN= DrHenley & follow up [facility] w/ neg exam she says and stool cards were neg by her report... last  Mammogram 6/10 at [facility] was neg... she refuses to consider a colonoscopy... Flu vaccine given each fall... had PNEUMOVAX 2009 & PREVNAR-13 given [id number]... OK TDAP 6/11...   Past Surgical History:  Procedure Laterality Date  . APPENDECTOMY    . COLON RESECTION  02/08/2011   Procedure: LAPAROSCOPIC SIGMOID COLON RESECTION;  Surgeon: Stark Klein, MD;  Location: [facility];  Service: General;  Laterality: N/A;  Laparoscopic Lower Anterior Bowel Resection  . COLON SURGERY    . COLONOSCOPY  11/27/10   rectal cancer, small sigmoid adenoma, diverticulosis  . ECTOPIC PREGNANCY SURGERY     Midline incision  . EP IMPLANTABLE DEVICE N/A 10/16/2015   Procedure: Pacemaker Implant;  Surgeon: Will Meredith Leeds, MD;  Location: [facility];  Service: Cardiovascular;  Laterality: N/A;  . HAMMER TOE SURGERY  1995   Dr. Silverio Decamp  . Laparoscopic Left oophorectomy, laparoscopic low anterior resection, placement of OnQ pain pump  02/08/2011  . NASAL SINUS SURGERY  1994  . OVARIAN CYST REMOVAL  02/08/2011   Procedure: OVARIAN CYSTECTOMY;  Surgeon: Stark Klein, MD;  Location: [facility];  Service: General;  Laterality: Left;  Removal of Left Ovary and Pelvic Mass  . VAGINAL HYSTERECTOMY  1980's    Outpatient  Encounter Medications as of 08/31/2017  Medication Sig  . albuterol (PROVENTIL HFA;VENTOLIN HFA) 108 (90 BASE) MCG/ACT inhaler Inhale 2 puffs into the lungs every 6 (six) hours as needed. Wheezing   . ascorbic acid (VITAMIN C) 250 MG CHEW Chew 250 mg by mouth daily.   . Calcium Carbonate-Vitamin D (CALCIUM-VITAMIN D) 500-200 MG-UNIT per tablet Take 2 tablets by mouth daily.   . Cholecalciferol (VITAMIN D3) 1000 UNITS CAPS Take 1 capsule by mouth daily.   . ferrous sulfate 325 (65 FE) MG tablet Take 325 mg by mouth daily with breakfast.  . loperamide (LOPERAMIDE A-D) 2 MG tablet Take 2 mg by mouth every 6 (six) hours as needed.  Marland Kitchen omeprazole (PRILOSEC) 20 MG capsule Take 20 mg by mouth daily.   . vitamin B-12 (CYANOCOBALAMIN) 250 MCG tablet Take 250 mcg by mouth daily.   . vitamin E (VITAMIN E) 200 UNIT capsule Take 200 Units by mouth daily.   . [DISCONTINUED] amLODipine (NORVASC) 5 MG tablet TAKE 1 TABLET BY MOUTH EVERY DAY  . [DISCONTINUED] atorvastatin (LIPITOR) 20 MG tablet Take 1 tablet (20 mg total) by mouth daily. (Patient taking differently: Take 10 mg by mouth daily. 1/2 tab (10mg ) at bedtime)  . [DISCONTINUED] ELIQUIS 2.5 MG TABS tablet TAKE 1 TABLET BY MOUTH TWICE A DAY  . [DISCONTINUED] furosemide (LASIX) 20 MG tablet Take 1 tablet (20 mg total) by mouth daily as needed. (Patient taking differently: Take 20 mg by mouth as needed (swelling). )  . gabapentin (NEURONTIN) 100 MG capsule Take 1-3 tabs as directed at bedtime  . [DISCONTINUED] gabapentin (NEURONTIN) 100 MG capsule Take 1 capsule (100 mg total) by mouth 3 (three) times daily. Take 1-3 tabs at bedtime (Patient taking differently: Take 100 mg by mouth. Take 1-3 tabs at bedtime)   Facility-Administered Encounter Medications as of 08/31/2017  Medication  . ondansetron (ZOFRAN) injection    Allergies  Allergen Reactions  . Sulfonamide Derivatives Nausea And Vomiting    Happened a long time ago, does not remember the other  reaction she had.    Immunization History  Administered Date(s) Administered  . Influenza Split 01/02/2011, 01/03/2012, 12/20/2012  . Influenza Whole 12/20/2009  . Influenza, High Dose Seasonal PF 01/24/2014, 01/01/2015, 05/24/2016  .  Influenza,inj,Quad PF,6+ Mos 01/21/2014, 12/21/2014  . Pneumococcal Conjugate-13 02/25/2014  . Pneumococcal Polysaccharide-23 03/22/2005    Current Medications, Allergies, Past Medical History, Past Surgical History, Family History, and Social History were reviewed in [employer] record.   Review of Systems         See HPI - all other systems neg except as noted...  The patient denies anorexia, fever, weight loss, weight gain, vision loss, decreased hearing, hoarseness, chest pain, syncope, dyspnea on exertion, peripheral edema, prolonged cough, headaches, hemoptysis, abdominal pain, melena, hematochezia, severe indigestion/heartburn, hematuria, incontinence, muscle weakness, suspicious skin lesions, transient blindness, difficulty walking, depression, unusual weight change, abnormal bleeding, enlarged lymph nodes, and angioedema.   Objective:   Physical Exam     WD, WN, 84 y/o WF in NAD... GENERAL:  Alert & oriented; pleasant & cooperative... HEENT:  [place]/AT, Glasses, EOM-wnl, PERRLA, EACs-clear, TMs-wnl, NOSE-clear, THROAT-clear & wnl. NECK:  Supple w/ fair ROM; no JVD; normal carotid impulses w/ faint right Cbruit; no thyromegaly or nodules palpated; no lymphadenopathy. CHEST:  few bibasilar dry rales about 1/3rd way up, no wheezing, no rhonchi, no cough, etc... HEART:  RR rate 60; Gr1/6 SEM without rubs or gallops, no msc heard... ABDOMEN:  Surg scar healed, nontender to palp, normal bowel sounds; no organomegaly or masses detected. EXT:  no deformities, mild arthritic changes; no varicose veins/ +venous insuffic/ 1-2+ edema found on 09/11/15 NEURO:  CN's intact; no focal neuro deficits... DERM:  No lesions noted; no rash  etc...  RADIOLOGY DATA:  Reviewed in the EPIC EMR & discussed w/ the patient...  LABORATORY DATA:  Reviewed in the EPIC EMR & discussed w/ the patient...   Assessment & Plan:    02/23/17>  Justise is stable clinically but the slowly rising CEA is a concern=> NEG CT Abd&Pelvis is reassuring but we will continue to follow;  She is 84 y/o but fiercely independent & actually doing quite well.. 08/31/17>   Henchy remains very stoic- only c/o some LE neuropathic symptoms and denies resp problems, SOB, etc;  We have prescribed Gabapentin 100mg => 300mg  Qhs and she is going to start on O2 at 2L/min Qhs; labs are ok & her CEA is NOT progressive; concern for her osteoporosis & compression fxs + spondylosis changes   ARRHYTHMIA/ LE Edema/ BNP=857=> resolved> ?flutter w/ slow VR, we started Lasix20 & referred to Cards ASAP for further eval=> AFib/Flutter, CHB=> pacer... 6/18>    on Eliquis2.5Bid, has pacer for CHB, cards notes no further AFib on device checks...  PULM>  AB, Pulm Fibrosis>  Overall stable on prn Proair; CXR reviewed & resp exam w/o changes, stable.  HBP>  BP improved w/ low dose Amlodipine 2.5mg /d, continue same & monitor at home...  MVP>  She is essent asymptomatic w/o CP, palpit, SOB, etc...  R Carotid Bruit> CDoppler w/ 40-59% RICAstenosis, on ASA...  CHOL>  Stable on Lip20 + diet, continue same...  GI> HH, GERD>  Stable on Prilosec, continue same...  RECTAL CANCER>  As above- she is post op 11/12 & had uneventful rehab; slowly improving and diarrhea is diminished w/ Align/ Activia; she will maintain f/u w/ DrByerly & DrGessner ==> f/u colonoscopy 11/13 w/ divertics, no recurrence.  DJD/ Osteopenia>  She remains on Fosamax, Calcium, MVI, Vit D, etc; she will be due for BMD next yr...  UTI>  Prev treated w/ Cipro empirically & currently asymptomatic...  Anxiety>  Stable & she does not require anxiolytic Rx...   Patient's Medications  New  Prescriptions   GABAPENTIN  (NEURONTIN) 100 MG CAPSULE    Take 1-3 tabs as directed at bedtime   TRAMADOL (ULTRAM) 50 MG TABLET    Take 1 tablet (50 mg total) by mouth 3 (three) times daily as needed for moderate pain.  Previous Medications   ALBUTEROL (PROVENTIL HFA;VENTOLIN HFA) 108 (90 BASE) MCG/ACT INHALER    Inhale 2 puffs into the lungs every 6 (six) hours as needed. Wheezing    ASCORBIC ACID (VITAMIN C) 250 MG CHEW    Chew 250 mg by mouth daily.    CALCIUM CARBONATE-VITAMIN D (CALCIUM-VITAMIN D) 500-200 MG-UNIT PER TABLET    Take 2 tablets by mouth daily.    CHOLECALCIFEROL (VITAMIN D3) 1000 UNITS CAPS    Take 1 capsule by mouth daily.    FERROUS SULFATE 325 (65 FE) MG TABLET    Take 325 mg by mouth daily with breakfast.   LOPERAMIDE (LOPERAMIDE A-D) 2 MG TABLET    Take 2 mg by mouth every 6 (six) hours as needed.   OMEPRAZOLE (PRILOSEC) 20 MG CAPSULE    Take 20 mg by mouth daily.    VITAMIN B-12 (CYANOCOBALAMIN) 250 MCG TABLET    Take 250 mcg by mouth daily.    VITAMIN E (VITAMIN E) 200 UNIT CAPSULE    Take 200 Units by mouth daily.   Modified Medications   Modified Medication Previous Medication   AMLODIPINE (NORVASC) 5 MG TABLET amLODipine (NORVASC) 5 MG tablet      TAKE 1 TABLET BY MOUTH EVERY DAY    Take 1 tablet (5 mg total) by mouth daily.   ATORVASTATIN (LIPITOR) 20 MG TABLET atorvastatin (LIPITOR) 20 MG tablet      Take 0.5 tablets (10 mg total) by mouth daily.    Take 1 tablet (20 mg total) by mouth daily.   ELIQUIS 2.5 MG TABS TABLET ELIQUIS 2.5 MG TABS tablet      TAKE 1 TABLET BY MOUTH TWICE A DAY    TAKE 1 TABLET BY MOUTH TWICE A DAY   FUROSEMIDE (LASIX) 20 MG TABLET furosemide (LASIX) 20 MG tablet      Take 1 tablet (20 mg total) by mouth as needed (swelling).    Take 1 tablet (20 mg total) by mouth daily as needed.  Discontinued Medications   AMLODIPINE (NORVASC) 5 MG TABLET    TAKE 1 TABLET BY MOUTH EVERY DAY

## 2018-02-20 NOTE — Telephone Encounter (Signed)
Called and spoke with patient, she was in on 11/29 to be seen for the pain in her back. She was taking the tramadol that he sent in and it helped at first but no longer is. She has had some relief but now it is back. She is scheduled to be seen tomorrow but she is wanting to know what she can do. She also wants to know if he has gotten the chance to look at her xrays of her back.  SN please advise, thank you.

## 2018-02-21 ENCOUNTER — Encounter: Payer: Self-pay | Admitting: Pulmonary Disease

## 2018-02-21 ENCOUNTER — Ambulatory Visit (INDEPENDENT_AMBULATORY_CARE_PROVIDER_SITE_OTHER): Payer: Medicare Other | Admitting: Pulmonary Disease

## 2018-02-21 VITALS — BP 136/70 | HR 60 | Temp 98.1°F | Ht 67.0 in | Wt 104.4 lb

## 2018-02-21 DIAGNOSIS — K219 Gastro-esophageal reflux disease without esophagitis: Secondary | ICD-10-CM | POA: Diagnosis not present

## 2018-02-21 DIAGNOSIS — Z950 Presence of cardiac pacemaker: Secondary | ICD-10-CM

## 2018-02-21 DIAGNOSIS — I6523 Occlusion and stenosis of bilateral carotid arteries: Secondary | ICD-10-CM

## 2018-02-21 DIAGNOSIS — M150 Primary generalized (osteo)arthritis: Secondary | ICD-10-CM

## 2018-02-21 DIAGNOSIS — S32000A Wedge compression fracture of unspecified lumbar vertebra, initial encounter for closed fracture: Secondary | ICD-10-CM

## 2018-02-21 DIAGNOSIS — M549 Dorsalgia, unspecified: Secondary | ICD-10-CM | POA: Insufficient documentation

## 2018-02-21 DIAGNOSIS — I10 Essential (primary) hypertension: Secondary | ICD-10-CM

## 2018-02-21 DIAGNOSIS — J8410 Pulmonary fibrosis, unspecified: Secondary | ICD-10-CM | POA: Diagnosis not present

## 2018-02-21 DIAGNOSIS — I059 Rheumatic mitral valve disease, unspecified: Secondary | ICD-10-CM | POA: Diagnosis not present

## 2018-02-21 DIAGNOSIS — K5730 Diverticulosis of large intestine without perforation or abscess without bleeding: Secondary | ICD-10-CM

## 2018-02-21 DIAGNOSIS — I4819 Other persistent atrial fibrillation: Secondary | ICD-10-CM | POA: Diagnosis not present

## 2018-02-21 DIAGNOSIS — E7800 Pure hypercholesterolemia, unspecified: Secondary | ICD-10-CM

## 2018-02-21 DIAGNOSIS — C20 Malignant neoplasm of rectum: Secondary | ICD-10-CM

## 2018-02-21 DIAGNOSIS — I272 Pulmonary hypertension, unspecified: Secondary | ICD-10-CM

## 2018-02-21 DIAGNOSIS — F411 Generalized anxiety disorder: Secondary | ICD-10-CM

## 2018-02-21 DIAGNOSIS — I872 Venous insufficiency (chronic) (peripheral): Secondary | ICD-10-CM | POA: Diagnosis not present

## 2018-02-21 DIAGNOSIS — M8000XA Age-related osteoporosis with current pathological fracture, unspecified site, initial encounter for fracture: Secondary | ICD-10-CM

## 2018-02-21 DIAGNOSIS — M159 Polyosteoarthritis, unspecified: Secondary | ICD-10-CM

## 2018-02-21 DIAGNOSIS — M810 Age-related osteoporosis without current pathological fracture: Secondary | ICD-10-CM | POA: Insufficient documentation

## 2018-02-21 NOTE — Patient Instructions (Signed)
Today we updated your med list in our EPIC system...    Continue your current medications the same...  Continue to rest w/ firm back support...  Continue the moist heat applied to the area of pain in your low back...  Continue the TRAMADOL 50mg  + 2 regular Tylenol tabs taken up to 3 times daily (morn, mid-day, and eve)  We have arranged for an ORTHOPEDIC appt tomorrow w/ DrHilts, [facility], [place] in [place]... At 10:20 AM tomorrow 02/22/18...   We will need to start you back on another med for osteoporosis-- we will contact you after the Orthopedic eval...  Call for any questions or if I can be of service in any way.Marland KitchenMarland Kitchen

## 2018-02-21 NOTE — Progress Notes (Signed)
Subjective:    Patient ID: Hannah Mercado, female    DOB: [birth year], 84 y.o.   MRN: [id number]  HPI 84 y/o WF here for a follow up visit... ~  SEE PREV EPIC NOTES FOR OLDER DATA >>     2DEcho 2/04 showing MVP, mild MR, normal LVF... StressEcho w/ EF=60%... Cardiolite 12/03 was normal w/o ischemia and EF=65%.  Baseline EKG 02/08/11>  NSR, rate60, 1st degree AVB w/ PR=.22  CXR 11/13 showed borderline cardiomeg, ectasia of the Ao, chronic lung changes/ NAD, DJD/ spondylosis...  LABS 11/13:  Chems- wnl;  CBC- wnl;  UA- ok...   LABS 5/14:  FLP- at goals on Lip20;  Chems- wnl;  CBC- wnl;  TSH=2.61;  VitD=56;  UA- few wbc, no bact...  CXR => done 6/15>  Borderline cardiomeg, prominent PAs, calcif Ao arch, COPD w/ sl incr markings/ NAD, DJD in Tspine...  LABS 5/15:  FLP- within parameters on Lip20;  Chems- wnl;  CBC- ok w/ Hg=12.6;  TSH=2.41;  VitD=55 on 1000u daily...  BMD => done 6/15>  Lowest Tscore is -2.6 in Lumbar spine; she is rec to continue ALENDRONATE 70mg /wk & calcium/ MVI/ VitD supplements...  CXR 6/16 showed borderline heart size, hyperinflated lungs, icr markings bilat, NAD/ no change...  LABS 6/16:  FLP- at goals on Atorva20;  Chems- wnl;  CBC- wnl;  TSH=3.86;  UA- clear... NOTE> CEA done 2/16 by DrByerly & we do not have results.  CDopplers 6/16 showed irreg mixed plaque bilat, 40-59% RICAstenosis & 0-81% LICAstenosis, norm subclav & antegrade vertebrals..The current medical regimen is effective;  continue present plan and medications. ASA daily. ~  03/12/15:  20mo ROV & Hannah Mercado turned 60 this past summer- surprise party by her 2 children w/ 40 people & she had a blast; she is going to family on [place] for 60mo over the holidays; clinically doing very well- feels good, still does crosswords daily, no new complaints or concerns... She is reminded to drink 1-2 can Ensure/ Sustacal/ Boost daily to help her gain wt...    ~  September 11, 2015:  77mo ROV & pulmonary/ medical follow  up visit>  Hannah Mercado reports a good 3mo interval but notes LE edema x 3-4wks;  She has gained 9# from her baseline 62mo ago (123#) to 132# today assoc w/ 2+pitting edema in both legs which is new; she denies dietary sodium excess or prev hx of edema=> see below as we review the following medical problems during today's office visit >>     Hx AB, pulm fibrosis> on ProventilHFA prn (rarely uses); no resp symptoms & breathing good, & she denies cough, phlegm, SOB, etc; CXR today shows cardiomeg & interstitial edema...    MVP, HBP> on ASA81 & Amlod2.5; BP= 138/70 but pulse is slow today w/ HR ~40 & she denies CP, palpit, dizzy/ syncope/ near-syncope, change in DOE etc...    Cardiac arrhythmia> new finding on today's EKG=> ?Flutter w/ slow VR, rate40; she has the new edema, interstitial changes on CXR & BNP=857 => referred to [facility] ASAP for eval...    Right carotid bruit> no cerebral ischemic symptoms; CDoppler 6/17 shows no change in 40-59% RICA stenosis, Rx ASA...    Chol> remains on Lip20 & FLP 6/17 looks great w/ TChol 148, TG 75, HDL 72, LDL 61    HH, GERD> on Prilosec20mg /d + antireflux regimen in view of her HH etc; she denies abd pain, n/v, notes intermit diarrhea, no blood seen...    Hx rectal  cancer> s/p lap low anterior resection 11/12 by DrByerly; f/u colonoscopy 11/13 by DrGessner- mod divertics, anastomosis ok; she sees DrByerly Q14mo- notes reviewed still w/ occas loose stool (uses Immodium prn); on Align & Activia; she's avoiding lactose & gas is improved, improved after nutrition consult;  CEA level 2/15 = 3.2 (stable); CEA 2/16 & 2/17 by DrByerly & we don't have report.    DJD, Osteopenia> on Fosamax70/wk along w/ Ca, MVI, VitD; last BMD 6/15 w/ lowest Tscore-2.6 (continue same rx), she is getting along well but hasn't played much golf since her surg...    Other problems as noted... She likes to take a number of supplements... EXAM shows Afeb, VSS x pulse=40; O2sat=96% on RA; Wt=132#, BMI=21;   HEENT- neg, mallampati1, R carotid bruit;  Chest- decr BS w/ few scat rhonchi, bibasilar rales- unchanged;  Heart- brady rate ~40, gr 1-2/6 SEM w/o r/g;  Abd- thin soft, neg;  Ext- VI, 2+edema in LEs w/o c/c;  Neuro- intact...  CDoppler 09/04/15>  Heterogeneous plaque bilat, stable 40-595 RICA stenosis & 1-61% LICA stenosis, subclav & vertebrals ok...  CXR 09/11/15>  Mod cardiomegaly, incr interstitial markings suggesting poss edema, increased markings at bases...  EKG 09/11/15>  Bradycardia, rate ~40, ?AFlutter, poor R progression V1-2...  LABS 09/11/15>  FLP- all parameters at goals on Lip20;  Chems- wnl w/ Cr=0.94, BS=112, BNP=857;  CBC- wnl;  TSH=4.80;  VitD=60;  UA- clear...  IMP/PLAN>>  While still essentially asymptomatic at age 46, Hannah Mercado has developed 2+pitting edema in LEs over the last 3weeks or so & has a bradycardia on EKG w/ ?Flutter & slow VR, only on Amlod2.5, BNP is elev at 857=> we will start Lasix20 & refer for Cards eval ASAP.    ~  February 24, 2016:  6 month [place] had Pacer placed in July- [place] for [facility]> most recent f/u 01/2016-- hx AFib/Flutter, low HR from CHB=> pacer placed 10/16/15, doing satis, planning trip to [place] for [place]... On Eliquis, no further AFib on device check... We reviewed the following medical problems during today's office visit >>     Hx AB, pulm fibrosis> on ProventilHFA prn (rarely uses); no resp symptoms & breathing good, & she denies cough, phlegm, SOB, etc; CXR today shows cardiomeg & interstitial edema...    MVP, HBP> on Amlod2.5 & Lasix20; BP= 124/70 but pulse is slow today w/ HR ~40 & she denies CP, palpit, dizzy/ syncope/ near-syncope, change in DOE etc...    Cardiac arrhythmia=> Fib/Flutter & CHB=>Pacer> new finding 08/2015=> ?Flutter w/ slow VR, rate40; she has the new edema, interstitial changes on CXR & BNP=857 => referred to [place]    Right carotid bruit> no cerebral ischemic symptoms; CDoppler 6/17 shows no change in  40-59% RICA stenosis, Rx ASA...    Chol> remains on Lip20 & FLP 6/17 looks great w/ TChol 148, TG 75, HDL 72, LDL 61    HH, GERD> on Prilosec20mg /d + antireflux regimen in view of her HH etc; she denies abd pain, n/v, notes intermit diarrhea, no blood seen...    Hx rectal cancer> s/p lap low anterior resection 11/12 by DrByerly; f/u colonoscopy 11/13 by DrGessner- mod divertics, anastomosis ok; she sees DrByerly Q70mo- notes reviewed still w/ occas loose stool (uses Immodium prn); on Align & Activia; she's avoiding lactose & gas is improved, improved after nutrition consult;  CEA level 2/15 = 3.2 (stable); CEA 2/16 & 2/17 by DrByerly & we don't have report.  DJD, Osteopenia> on Fosamax70/wk along w/ Ca, MVI, VitD; last BMD 6/15 w/ lowest Tscore-2.6 (continue same rx), she is getting along well but hasn't played much golf since her surg...    Other problems as noted... She likes to take a number of supplements... EXAM shows Afeb, VSS; O2sat=97% on RA; Wt=122#, BMI=19;  HEENT- neg, mallampati1, R carotid bruit;  Chest- decr BS w/ few scat rhonchi, bibasilar rales- unchanged;  Heart- RR rate70, gr 1-2/6 SEM w/o r/g;  Abd- thin soft, neg;  Ext- VI, +tr edema in LEs w/o c/c;  Neuro- intact... IMP/PLAN>>  Kaleisha is stable & looking forward to Xmas in [place] w/ family; continue same meds and follow up w/ [place] & [facility]...  ~  August 24, 2016:  56mo ROV & Troyce reports a good interval- she notes sl wobbly on occas & wonders about her Lasix but BP is good & edema resolved;  She reports that she's going to [employer] in [place] for [place]mo soon... we reviewed the following medical problems during today's office visit >>     Hx AB, pulm fibrosis> on ProventilHFA prn (rarely uses); no resp symptoms & breathing good, & she denies cough, phlegm, SOB, etc; CXR today shows cardiomeg & interstitial edema...    MVP, HBP> on Amlod5 & Lasix20; BP= 124/70 but pulse is slow today w/ HR ~40 & she denies CP, palpit, dizzy/ syncope/ near-syncope,  change in DOE etc...    Cardiac arrhythmia=> Fib/Flutter & CHB=>Pacer> new finding 08/2015=> hx AFib/Flutter, low HR from CHB=> pacer placed 10/16/15, on Eliquis2.5Bid, no further AFib on device checks...    Right carotid bruit> on ASA81; no cerebral ischemic symptoms; CDoppler 6/17 shows no change in 40-59% RICA stenosis, but f/u CDopplers 6/18 showed bilat 1-39% ICAstenoses, continue same...    Chol> remains on Lip20 & FLP 6/18 looks great w/ TChol 183, TG 101, HDL 83, LDL 80... continue same.    HH, GERD> on Prilosec20mg /d + antireflux regimen in view of her [place] etc; she denies abd pain, n/v, notes intermit diarrhea, no blood seen...    Hx rectal cancer> s/p lap low anterior resection 11/12 by DrByerly; f/u colonoscopy 11/13 by DrGessner- mod divertics, anastomosis ok; she sees DrByerly Q8mo- notes reviewed still w/ occas loose stool (uses Immodium prn); on Align & Activia; she's avoiding lactose & gas is improved, improved after nutrition consult;  CEA levels have been in the 3's but f/u 08/24/16= 4.6 & we will recheck in 73mo.    Mild renal insuffic>  Prev renal function wnl (0.94-1.08) and labs 6/18 showed Cr=1.42 on Lasix20/d... OK to change to "prn edema" & reminded to incr water intake.    DJD, Osteopenia> on Fosamax70/wk along w/ Ca, MVI, VitD; BMD recheck 6/18 showed lowest Tscore-2.5 & she is in need of a drug holiday starting now- she is getting along well but hasn't played much golf since her surg...    Other problems as noted... She likes to take a number of supplements... EXAM shows Afeb, VSS; O2sat=98% on RA; Wt=124#, BMI=19;  HEENT- neg, mallampati1, R carotid bruit;  Chest- decr BS w/ few scat rhonchi, bibasilar rales- unchanged;  Heart- RR rate70, gr 1-2/6 SEM w/o r/g;  Abd- thin soft, neg;  Ext- VI, +tr edema in LEs w/o c/c;  Neuro- intact...  CXR 08/24/16>  Cardiomeg, pacer, overinflation/ COPD, bibasilar scarring, NAD...   LABS 08/24/16>  FLP- at goals on Atorva20;  Chems- ok x BS=117,  Cr=1.42;  CBC- wnl w/ Hg=13.5;  Stool  neg;  TSH=2.61;  CEA=4.6 (up from 3.2);  VitD=55;  UA shows UTI w/ +leukocytes/ nitrite pos/ +bact=> Rx w/ CIPRO  Bone Density 09/01/16>  Lowest Tscore is -2.5 in Lumbar spine, and -2.4 in RFN... She has been on Bisphos Rx for yrs & in need of a drug holiday-- Rec to stop after current supply gone & continue calcium, VitD, wt bearing exercise; we will plan f/u BMD at the 56yr interval...   CDopplers 09/09/16 showed heterogen plaque, bilat 1-39% ICA stenoses, >50% RECA stenosis, normal subclav &vertebrals... Stable to improved on ASA81- continue same... IMP/PLAN>>  OK for Hannah Mercado to try off the Lasix20/d to "prn" for edema;  UTI treated w/ Cipro250Bid x 7d;  BMD is sl improved & she is in need of a bisphos drug holiday- therefore finish to current supply & then stop the Fosamax but continue dietary calcium, vit D supplement and wt bearing exercise;  DrByerly wanted her to have stool cards=> neg for blood, but f/u CEA is 4.6 => rec to recheck in 24mo...  ~  February 23, 2017:  72mo ROV & Hannah Mercado indicates that "I've got a little hitch in my get-a-long" meaning that she has some right hip pain but notes that tylenol & "rapid release gel" really help & she is not inclined toward further eval- asked to watch this closely & let me know if she needs further eval w/ XRays, Ortho, etc...  She had a great Thanksgiving in [place] w/ family;  Her recommendations for doing well at 89=> eat right, exercise daily, don't watch TV all day long!  She lives alone in her own townhouse & cares for it by herself, including her 5 christmas trees (recent article in [id number])...    She saw [facility]/EP- DrCamnitz on 11/15/16>  Hx HBP, AFib/Flutter, pacer placed7/2017 for CHB, HL-- doing satis in AFib, on Amlod5, Eliquis2.5Bid, pacer functioning normally, Lasix20 prn, no changes made...  We reviewed the following medical problems during today's office visit>      AB, postinflamm pulm fibrosis> has  AlbutHFA for prn use, CXR w/ cardiomeg & bibasilar scarring, stable...    Cards- HBP, AFib/Flutter, Hx CHB w/ pacer; followed by DrCamnitz as above...    Hx R-CBruit> on ASA81, CDopplers w/ 40-59% RICA stenosis but f/u CDopplers 6/18 showed bilat 1-39% ICA stenoses and >50% RECA stenosis...    HL> on Lip20 taking 1/2 tab daily w/ good FLP 6/18...    Underweight> wt is down 2# to 122# today & BMI=15, we discussed nutrittional supplements...    Hx rectal cancer- s/p low ant resection 11/12 by DrByerly; slowly rising CEA level=> 3's thru 2015, 4.6 in Jun2018, 6.2 in Dec2018=> CT Abd&Pelvis ordered (see below)    Mild renal insuffic?> Cr= 1.42 in Jun2018 on Lasix20 for edema, and now 0.91 on Lasix prn...    DJD/ osteoporosis> on calcium, MVI, VitD and Fosamax70/wk; last BMD 6/18 showed Tscore -2.5 & we started a drug holiday at that time...  EXAM shows Afeb, VSS; O2sat=97% on RA; Wt=122#, BMI=19;  HEENT- neg, mallampati1, R carotid bruit;  Chest- decr BS w/ few scat rhonchi, bibasilar rales- unchanged;  Heart- RR rate70, gr 1-2/6 SEM w/o r/g;  Abd- thin soft, neg;  Ext- VI, +tr edema in LEs w/o c/c;  Neuro- intact...  LABS 02/23/18>  Chems- ok w/ Cr=0.91;  CEA- 6.2  CT Abd&Pelvis 03/08/17 >> Cardiomeg w/ coronary & aortic calcif; sigmoid diverticulosis, low rectal anastomotic staple line w/o evid of recurrent tumor, small retroperitoneal  LNs (not pathologically enlarged), bilat renal cysts & bladder diverticulum, aorto-iliac atherosclerotic vasc dis & narrowing of the prox celiac trunk & SMA (renal arts are ok)... No evid for recurrent colorectal cancer... IMP/PLAN>>  Hannah Mercado is stable clinically but the slowly rising CEA is a concern=> NEG CT Abd&Pelvis is reassuring but we will continue to follow;  She is 84 y/o but fiercely independent & actually doing quite well...    ~  August 31, 2017:  54mo ROV & Hannah Mercado reports that her breathing is good, she denies cough/ sput/ SOB; she denies CP/ palpit (she is in chr  AFib)/ edema; her CC is that her legs sting & burn c/w neuropathy and we discussed a trial of Gabapentin for this... No interval Epic medical notes over the last 6 months...  We reviewed the following medical problems during today's office visit>      AB, postinflamm pulm fibrosis> has AlbutHFA for prn use, CXR w/ cardiomeg & bibasilar scarring, stable...    PulmHTN> 2DEcho 08/2015 w/ PAsys= 24mmHg; 2DEcho 08/2017 w/ PAsys= 15mmHg; etiology is multifactorial...    Cards- HBP, AFib/Flutter, Hx CHB w/ pacer; followed by DrCamnitz as above...    Hx R-CBruit> on ASA81, CDopplers w/ 40-59% RICA stenosis but f/u CDopplers 6/18 showed bilat 1-39% ICA stenoses and >50% RECA stenosis...    HL> on Lip20 taking 1/2 tab daily w/ good FLP 6/18 & 6/19 (TChol 176, TG 81, HDL 85, LDL 76)...    Underweight> wt is stable at 122# today & BMI=15, we discussed nutritional supplements...    Hx rectal cancer- s/p low ant resection 11/12 by DrByerly; slowly rising CEA level=> 3's thru 2015, 4.6 in Jun2018, 6.2 in Dec2018=> CT Abd&Pelvis 02/2017 w/o evid of recurrent cancer...    Mild renal insuffic?> Cr= 1.42 in Jun2018 on Lasix20 for edema, and now improved to 1.05 on Lasix prn...    DJD/ osteoporosis/ compression fxs> on calcium, MVI, VitD and Fosamax70/wk; last BMD 6/18 showed Tscore -2.5 (sl improved) & we started a drug holiday at that time; NOTE: CT Abd 02/2017 also showed new interval 70% vertebral body compression fracture at L4 with vertebral sclerosis and 10 mm of posterior bony rim retropulsion contributing to mild impingement at the L4-5 level, & there is also a chronic 30% superior endplate compression fracture at L2. EXAM shows Afeb, VSS; O2sat=97% on RA; Wt=122#, BMI=19;  HEENT- neg, mallampati1, R carotid bruit;  Chest- decr BS w/ few scat rhonchi, bibasilar rales- unchanged;  Heart- RR rate70, gr 1-2/6 SEM w/o r/g;  Abd- thin soft, neg;  Ext- VI, +tr edema in LEs w/o c/c;  Neuro- c/o neuropathy symptoms in legs  but not c/o any signif LBP...  Ambulatory oximetry 08/31/17>  O2sat=100% on RA at rest w/ pulse=88/min;  She ambulated 3 laps in office (185'each) w/ lowest O2sat=97% w/ pulse=79/min  LABS 08/31/17>  FLP- all parameters at goals on Atorv10;  Chems- ok x K=5.6, BS=116, Cr=1.05, LFTs wnl;  CBC- wnl w/ Hg=14, WBC=6.6;  TSH=3.87;  Pro-BNP=532 (norm=<100);  CEA=5.7 (down from 6.2 prev)...   2DEcho 09/02/17>  Pt in AFib, norm LVF w/ EF=55-60%, norm wall motion, triv AI/ no stenosis, mild bileaflet MVprolapse w/ triv MR, severe LA dil (38mm), norm RV, RA severely dilated, PAsys=66 mmHg  ONO on RA 08/2017>  Reported to show O2 desat to 77% & we set her up w/ Home O2 at 2L/min Qhs for the nocturnal hypoxemia & her pulmHTN...  IMP/PLAN>>  Bellamy remains very stoic- only c/o  some LE neuropathic symptoms and denies resp problems, SOB, etc;  We have prescribed Gabapentin 100mg => 300mg  Qhs and she is going to start on O2 at 2L/min Qhs; labs are ok & her CEA is NOT progressive; concern for her osteoporosis & compression fxs + spondylosis changes...   ~  February 22, 2018:  6 month ROV & add-on appt requested for severe back pain>  Hannah Mercado reports that about 2 months ago she lifted the corner of a heavy sofa & over then next few days she developed LBP and treated self w/ heat/ OTC analgesics/ etc & improved somewhat; then she tried to apply a fitted sheet to a bed & the pain increased; she denies falling or any known trauma; the pain got steadily more severe & she went to [facility]/06/18 & was Dx w/ lumbar strain & given Cyclobenzaprine 5mg - 1/2 tab Qhs...  XRay of Lumbar Spine 02/06/18 at [facility] showed compression fxs of L2 (~50%), L3 (~30%), L4 (~50%) w/ multilevel spondylosis...    She called our office with c/o back discomfort but did not provide additional data ("slight pulled muscle & back sprain") & we called in Trasmadol 50mg  one tab Tid prn pain;  She finally presented to our office 02/17/18 & was  seen by TNichols,NP and given Depomedrol injection and asked to f/u w/ me today... NOTE: she is 84 y/o, known osteoporosis & prev Fosamax rx x yrs (placed on drug holiday 08/2016 when f/u BMD showed some improvement); no prev back XRays however CT Abd done 03/07/17 (no sign of recurrent rectal cancer) showed incidental finding of L4 compression (~70% w/ sclerosis & 44mm post body retropulsion) and L2 superior endplate compression (~12%), also lumbar spondylosis & DDD w/ bilat foraminal impingement L4-5;  No other lumbar spine images avail x those seen on BMD scans from 08/2013 & 08/2016 (both did not appear to show L4 compression)...     EXAM shows Afeb, VSS; O2sat=98% on RA; Wt=104# (down 16# in 53mo), BMI=16;  HEENT- neg, mallampati1, R carotid bruit;  Chest- decr BS w/ few scat rhonchi, bibasilar rales- unchanged;  Heart- RR rate70, gr 1-2/6 SEM w/o r/g;  Abd- thin soft, neg;  Ext- VI, +tr edema in LEs w/o c/c;  Neuro- back is min tender to palp, pain w/ movement, & could not perform SLR. IMP/PLAN>>  Lillah has severe subacute LBP w/ mult potential etiologies- 3 compression fxs, malalignment, spondylosis & foraminal impingement may all be playing a roll; she will need urgent Ortho vs NS eval w/ MRI and our staff was able to arrange for consult w/ [facility] tomorrow (DrHilts) at 10:20AM;  NOTE: she will need to re-start osteoporosis meds & with her 3 ncompression fxs I would rec FORTEO injections (we will proceed w/ prior auth for this med)...            Problem List:   pt's cell # [phone]  ALLERGY (ICD-995.3) - uses OTC meds as needed.  ASTHMATIC BRONCHITIS, ACUTE (ICD-466.0) - uses PROAIR as needed, but doing well, breathing OK. ~  7/09:  AB exac Rx w/ Pred, Avelox, Mucinex, etc... symptoms resolved. ~  CXR: baseline film w/ mild cardiomeg, scarring, chr changes, HH seen, & NAD.Marland Kitchen. ~  f/u CXR 6/11 & 7/12>  no change... ~  CXR 11/13 showed borderline cardiomeg, ectasia of the Ao, chronic lung  changes/ NAD, DJD/ spondylosis..  ~  CXR 6/15>  Borderline cardiomeg, prominent PAs, calcif Ao arch, COPD w/ sl incr markings/  NAD, DJD in Tspine ~  CXR 6/16 showed borderline heart size, hyperinflated lungs, icr markings bilat, NAD/ no change ~  CXR 6/17 showed mod cardiomegaly, incr interstitial markings suggesting poss edema, increased markings at bases... ~  CXR 6/18 showed cardiomeg, pacer, overinflation/ COPD, bibasilar scarring, NAD...   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 exam showed bibasilar velcro rales at bases...  HYPERTENSION >> on NORVASC 5mg - 1/2 tab daily... ~  1/13: BP= 160/80 & we decided to start Norvasc5mg /d... subeq cut down to 1/2 tab per phone message. ~  3/13: BP= 154/84 & repeat 124/74; she feels well & denies CP, palpit, SOB, edema, etc; continue same... ~  5/13:  BP= 150/68 & she remains asymptomatic &in good spirits; ok to continue same for now, monitor BP at home. ~  11/13:  BP= 122/70 & she continues to gain strength slowly... ~  11/14: on Amlod5-1/2 daily; BP= 116.62 & she denies CP, palpit, SOB, edema... ~  5/15: on ASA81 & Amlod2.5; BP= 124/70 & similar at home; stable w/ msc & gr1/6 SEM; no CP, palpit, ch in SOB, edema, etc ~  12/15: on ASA81 & Amlod2.5; BP= 122/62 and stable, doing satis... ~  6/16: on ASA81 & Amlod2.5; BP= 136/80 & similar at home; stable w/ msc & gr1/6 SEM; no CP, palpit, ch in SOB, edema, etc ~  6/17: on ASA81 & Amlod2.5; BP= 138/70 but pulse is slow today w/ HR ~40 & she denies CP, palpit, dizzy/ syncope/ near-syncope, change in DOE etc... ~  6/18 on ASA81 & Amlod5 & Lasix20;  BP= 132/74 & she denies CP, palpit, SOB, edema...  MITRAL VALVE PROLAPSE >> Atrial Fib/ Flutter Complete heart block => PACER 09/2015 ~  had 2DEcho 2/04 showing MVP, mild MR, normal LVF... StressEcho w/ EF=60%... subseq Cardiolite 12/03 was normal w/o ischemia and EF=65%... denies CP,  palpit, dizzy, syncope, edema, etc; she is gradually increasing activities post op... ~  EKG 11/12 showed NSR, rate60, sl incr voltage, NAD... ~  6/17> Cardiac arrhythmia> new finding on today's EKG=> ?Flutter w/ slow VR, rate40; she has the new edema, interstitial changes on CXR & BNP=857 => referred to [facility] ASAP for eval... ~  09/2015> Pacer placed by [facility] for CHB; subseq device checks w/o recurrent AFib, pt on Eliquis... ~  08/2016> on Eliquis2.5Bid & stable...  Right Carotid Bruit>  no cerebral ischemic symptoms; CDoppler 6/16 showed 40-59% RICA stenosis, Rx ASA. ~  no cerebral ischemic symptoms; CDoppler 6/17 shows no change in 40-59% RICA stenosis, Rx ASA... ~  CDoppler 08/2016>  heterogen plaque, bilat 1-39% ICA stenoses, >50% RECA stenosis, normal subclav &vertebrals... Stable to improved on ASA81- continue same...  HYPERCHOLESTEROLEMIA (ICD-272.0) - controlled on LIPITOR 20mg /d,  and tol well...  ~  labs 7/08 showed TChol 183, TG 111, HDL 65, LDL 96... Doing well, continue Lip20. ~  FLP 11/09 showed TChol 189, TG 70, HDL 71, LDL 104 ~  FLP 5/10 showed TChol 173, TG 53, HDL 75, LDL 87... Remains stable on Lip20. ~  FLP 6/11 showed TChol 183, TG 103, HDL 74, LDL 88 ~  FLP 7/12 showed TChol 182, TG 127, HDL 74, LDL 82... Continues stable on Lip20. ~  FLP 5/13 on Lip20 showed TChol 167, TG 79, HDL 82, LDL 69... Continue same. ~  FLP 5/14 on Lip20 showed TChol 174, TG 64, HDL 78, LDL 83 ~  FLP 5/15 on Lip20  showed TChol 184, TG 84, HDL 83, LDL 84 ~  FLP 6/16 on Lip20 showed TChol 187, TG 83, HDL 93, LDL 78 ~  FLP 6/17 on Lip20 showed TChol 148, TG 75, HDL 72, LDL 61 ~  FLP 6/18 on Lip20 showed TChol 183, TG 101, HDL 83, LDL 80   HIATAL HERNIA (ICD-553.3) & GERD (ICD-530.81) - she has a HH seen on CXR and CT Chest... takes OMEPRAZOLE 20mg /d and denies heartburn, GERD, regurg, dysphagia, etc... pt never had EGD or Colonoscopy> she had repeatedly refused to sched a colonoscopy! ~  Q28mo  ROVs>  she continues to deny reflux symptoms, abd pain, N/V, etc>  ADENOCARCINOMA of the RECTUM >> Dx 9/12 via colonoscopy for hematochezia & 11/12 underwent laparoscopic assisted low anterior resection by DrByerly [facility] & left ovarian cystectomy (benign) by Samuel Jester; final path = T2 N0 mod diff adenocarcinoma invading into but not through the muscularis, no lymphatic invasion, 12 neg LNs, clear margins... ~  6/13:  She continues to f/u w/ DrByerly; c/o some loose stools, otherw ok... ~  11/13: she had f/u appt w/ DrGessner- Colonoscopy 11/13 showed severe divertics on left, mod on right, clean anastomosis... ~  She continues to f/u w/ DrByerly, [facility] (last visit 2/16)> doing satis, just occas bouts of diarrhea & uses Immodium as needed... They did CEA level but we don't have report. ~  She continues to f/u w/ DrByerly yearly- we checked stool cards 6/18- NEG; we did CEA 6/18 => 4.6 & we will recheck in 17mo  RECURRENT UTIs>  Routine urinalysis w/ WBC, Bact, Leukocyte+, etc... Treated w/ Cipro as required.  DEGENERATIVE JOINT DISEASE (ICD-715.90) - mild in fingers, no other complaints x "I can't make quick turns anymore"... she notes the Tonic Water Qhs works great for her nightime cramps, & on NEURONTIN 100mg - 2Qhs for ?neuropathy symptoms?  OSTEOPENIA (ICD-733.90) - BMDs here in 2003, 2005, 2007- showed osteopenia/ osteoporosis w/ TScores -1.9 to -2.5.Marland KitchenMarland Kitchen on FOSAMAX, Ca++, Vits... ~  labs 11/09 showed Vit D level = 28... rec to take OTC supplement 1000 u daily. ~  BMD here 7/11 showed TScores -2.3 in Lumbar Spine, and -2.3 in right FemNeck... continue Rx. ~  Labs 5/13 showed Vit D level = 54... rec to continue her 1000u supplement... ~  BMD 6/15>  Lowest Tscore is -2.6 in Lumbar spine; she is rec to continue ALENDRONATE 70mg /wk & calcium/ MVI/ VitD supplements. ~  BMD 6/18>  Lowest Tscore is -2.5 in Lumbar spine, and -2.4 in RFN;  She is in need of bisphos drug holday=> stop now for the next 33yr cycle &  recheck BMD in June 2020 ~  CT Abd 02/2017 showed new interval 70% vertebral body compression fracture at L4 with vertebral sclerosis and 10 mm of posterior bony rim retropulsion contributing to mild impingement at the L4-5 level, & there is also a chronic 30% superior endplate compression fracture at L2.  ANXIETY (ICD-300.00)  Health Maintenance - GYN= DrHenley & follow up [place] w/ neg exam she says and stool cards were neg by her report... last M[facility] 6[facility]10 [facility] was neg... she refuses to consider a colonoscopy... Flu vaccine given each fall... had PNEUMOVAX 2009 & PREVNAR-13 given [id number]... OK TDAP 6/11...   Past Surgical History:  Procedure Laterality Date  . APPENDECTOMY    . COLON RESECTION  02/08/2011   Procedure: LAPAROSCOPIC SIGMOID COLON RESECTION;  Surgeon: Stark Klein, MD;  Location: [facility];  Service: General;  Laterality: N/A;  Laparoscopic  Lower Anterior Bowel Resection  . COLON SURGERY    . COLONOSCOPY  11/27/10   rectal cancer, small sigmoid adenoma, diverticulosis  . ECTOPIC PREGNANCY SURGERY     Midline incision  . EP IMPLANTABLE DEVICE N/A 10/16/2015   Procedure: Pacemaker Implant;  Surgeon: Will Meredith Leeds, MD;  Location: [place] [facility];  Service: Cardiovascular;  Laterality: N/A;  . HAMMER TOE SURGERY  1995   Dr. Silverio Decamp  . Laparoscopic Left oophorectomy, laparoscopic low anterior resection, placement of OnQ pain pump  02/08/2011  . NASAL SINUS SURGERY  1994  . OVARIAN CYST REMOVAL  02/08/2011   Procedure: OVARIAN CYSTECTOMY;  Surgeon: Stark Klein, MD;  Location: [facility];  Service: General;  Laterality: Left;  Removal of Left Ovary and Pelvic Mass  . VAGINAL HYSTERECTOMY  1980's    Outpatient Encounter Medications as of 02/21/2018  Medication Sig  . albuterol (PROVENTIL HFA;VENTOLIN HFA) 108 (90 BASE) MCG/ACT inhaler Inhale 2 puffs into the lungs every 6 (six) hours as needed. Wheezing   . amLODipine (NORVASC) 5 MG tablet TAKE 1 TABLET BY  MOUTH EVERY DAY  . ascorbic acid (VITAMIN C) 250 MG CHEW Chew 250 mg by mouth daily.   Marland Kitchen atorvastatin (LIPITOR) 20 MG tablet Take 0.5 tablets (10 mg total) by mouth daily.  . Calcium Carbonate-Vitamin D (CALCIUM-VITAMIN D) 500-200 MG-UNIT per tablet Take 2 tablets by mouth daily.   . Cholecalciferol (VITAMIN D3) 1000 UNITS CAPS Take 1 capsule by mouth daily.   Marland Kitchen ELIQUIS 2.5 MG TABS tablet TAKE 1 TABLET BY MOUTH TWICE A DAY  . ferrous sulfate 325 (65 FE) MG tablet Take 325 mg by mouth daily with breakfast.  . furosemide (LASIX) 20 MG tablet Take 1 tablet (20 mg total) by mouth as needed (swelling).  . gabapentin (NEURONTIN) 100 MG capsule Take 1-3 tabs as directed at bedtime  . loperamide (LOPERAMIDE A-D) 2 MG tablet Take 2 mg by mouth every 6 (six) hours as needed.  Marland Kitchen omeprazole (PRILOSEC) 20 MG capsule Take 20 mg by mouth daily.   . traMADol (ULTRAM) 50 MG tablet Take 1 tablet (50 mg total) by mouth 3 (three) times daily as needed for moderate pain.  . vitamin B-12 (CYANOCOBALAMIN) 250 MCG tablet Take 250 mcg by mouth daily.   . vitamin E (VITAMIN E) 200 UNIT capsule Take 200 Units by mouth daily.    Facility-Administered Encounter Medications as of 02/21/2018  Medication  . ondansetron (ZOFRAN) injection    Allergies  Allergen Reactions  . Sulfonamide Derivatives Nausea And Vomiting    Happened a long time ago, does not remember the other reaction she had.    Immunization History  Administered Date(s) Administered  . Influenza Split 01/02/2011, 01/03/2012, 12/20/2012  . Influenza Whole 12/20/2009  . Influenza, High Dose Seasonal PF 01/24/2014, 01/01/2015, 05/24/2016, 01/22/2018  . Influenza,inj,Quad PF,6+ Mos 01/21/2014, 12/21/2014  . Pneumococcal Conjugate-13 02/25/2014  . Pneumococcal Polysaccharide-23 03/22/2005    Current Medications, Allergies, Past Medical History, Past Surgical History, Family History, and Social History were reviewed in [employer]  record.   Review of Systems         See HPI - all other systems neg except as noted... +wt loss, poor appetite, +weak, LBP, difficulty walking The patient denies anorexia, fever, weight gain, vision loss, decreased hearing, hoarseness, chest pain, syncope, dyspnea on exertion, peripheral edema, prolonged cough, headaches, hemoptysis, abdominal pain, melena, hematochezia, severe indigestion/heartburn, hematuria, incontinence, suspicious skin lesions, transient blindness, depression, abnormal bleeding, enlarged  lymph nodes, and angioedema.   Objective:   Physical Exam   Thin, weak, 84 y/o WF in mild distress from LBP => she has lost weight down 16# to 104# today w/ BMI=16 GENERAL:  Alert & oriented; pleasant & cooperative... HEENT:  [place]/AT, Glasses, EOM-wnl, PERRLA, EACs-clear, TMs-wnl, NOSE-clear, THROAT-clear & wnl. NECK:  Supple w/ fair ROM; no JVD; normal carotid impulses w/ faint right Cbruit; no thyromegaly or nodules palpated; no lymphadenopathy. CHEST:  few bibasilar dry rales about 1/3rd way up, no wheezing, no rhonchi, no cough, etc... HEART:  RR rate 60; Gr1/6 SEM without rubs or gallops, no msc heard... ABDOMEN:  Surg scar healed, nontender to palp, normal bowel sounds; no organomegaly or masses detected. EXT:  no deformities, mild arthritic changes; no varicose veins/ +venous insuffic/ no edema at present... NEURO:  CN's intact; no focal neuro deficits... Back is sl tender to palp, unable to lie down & attempt SLR. DERM:  No lesions noted; no rash etc...  RADIOLOGY DATA:  Reviewed in the EPIC EMR & discussed w/ the patient...  LABORATORY DATA:  Reviewed in the EPIC EMR & discussed w/ the patient...   Assessment & Plan:    02/23/17>  Crisol is stable clinically but the slowly rising CEA is a concern=> NEG CT Abd&Pelvis is reassuring but we will continue to follow;  She is 84 y/o but fiercely independent & actually doing quite well.. 08/31/17>   Tyaira remains very stoic- only  c/o some LE neuropathic symptoms and denies resp problems, SOB, etc;  We have prescribed Gabapentin 100mg => 300mg  Qhs and she is going to start on O2 at 2L/min Qhs; labs are ok & her CEA is NOT progressive; concern for her osteoporosis & compression fxs + spondylosis changes 02/22/18>   Hannah Mercado has severe subacute LBP w/ mult potential etiologies- 3 compression fxs, malalignment, spondylosis & foraminal impingement may all be playing a roll; she will need urgent Ortho vs NS eval w/ MRI and our staff was able to arrange for consult w/ [facility] tomorrow (DrHilts) at 10:20AM;  NOTE: she will need to re-start osteoporosis meds & with her 3 ncompression fxs I would rec FORTEO injections (we will proceed w/ prior auth for this med)...    ARRHYTHMIA/ LE Edema/ BNP=857=> resolved> ?flutter w/ slow VR, we started Lasix20 & referred to Cards ASAP for further eval=> AFib/Flutter, CHB=> pacer... 6/18>    on Eliquis2.5Bid, has pacer for CHB, cards notes no further AFib on device checks...  PULM>  AB, Pulm Fibrosis>  Overall stable on prn Proair; CXR reviewed & resp exam w/o changes, stable.  HBP>  BP improved w/ low dose Amlodipine 2.5mg /d, continue same & monitor at home...  MVP>  She is essent asymptomatic w/o CP, palpit, SOB, etc...  R Carotid Bruit> CDoppler w/ 40-59% RICAstenosis, on ASA...  CHOL>  Stable on Lip20 + diet, continue same...  GI> HH, GERD>  Stable on Prilosec, continue same...  RECTAL CANCER>  As above- she is post op 11/12 & had uneventful rehab; slowly improving and diarrhea is diminished w/ Align/ Activia; she will maintain f/u w/ DrByerly & DrGessner ==> f/u colonoscopy 11/13 w/ divertics, no recurrence.  DJD/ Osteoporosis>  She remains on bisphos drug holiday, +Calcium/ MVI,/ Vit D;  BMD 08/2016 w/ lowest Tscore -2.5 in Lspine (improved) & in need of biphos drug holiday...  UTI>  Prev treated w/ Cipro empirically & currently asymptomatic...  Anxiety>  Stable & she does not  require anxiolytic Rx.Marland KitchenMarland Kitchen  Patient's Medications  New Prescriptions   No medications on file  Previous Medications   ALBUTEROL (PROVENTIL HFA;VENTOLIN HFA) 108 (90 BASE) MCG/ACT INHALER    Inhale 2 puffs into the lungs every 6 (six) hours as needed. Wheezing    AMLODIPINE (NORVASC) 5 MG TABLET    TAKE 1 TABLET BY MOUTH EVERY DAY   ASCORBIC ACID (VITAMIN C) 250 MG CHEW    Chew 250 mg by mouth daily.    ATORVASTATIN (LIPITOR) 20 MG TABLET    Take 0.5 tablets (10 mg total) by mouth daily.   CALCIUM CARBONATE-VITAMIN D (CALCIUM-VITAMIN D) 500-200 MG-UNIT PER TABLET    Take 2 tablets by mouth daily.    CHOLECALCIFEROL (VITAMIN D3) 1000 UNITS CAPS    Take 1 capsule by mouth daily.    ELIQUIS 2.5 MG TABS TABLET    TAKE 1 TABLET BY MOUTH TWICE A DAY   FERROUS SULFATE 325 (65 FE) MG TABLET    Take 325 mg by mouth daily with breakfast.   FUROSEMIDE (LASIX) 20 MG TABLET    Take 1 tablet (20 mg total) by mouth as needed (swelling).   GABAPENTIN (NEURONTIN) 100 MG CAPSULE    Take 1-3 tabs as directed at bedtime   LOPERAMIDE (LOPERAMIDE A-D) 2 MG TABLET    Take 2 mg by mouth every 6 (six) hours as needed.   OMEPRAZOLE (PRILOSEC) 20 MG CAPSULE    Take 20 mg by mouth daily.    TRAMADOL (ULTRAM) 50 MG TABLET    Take 1 tablet (50 mg total) by mouth 3 (three) times daily as needed for moderate pain.   VITAMIN B-12 (CYANOCOBALAMIN) 250 MCG TABLET    Take 250 mcg by mouth daily.    VITAMIN E (VITAMIN E) 200 UNIT CAPSULE    Take 200 Units by mouth daily.   Modified Medications   No medications on file  Discontinued Medications   No medications on file

## 2018-02-22 ENCOUNTER — Ambulatory Visit (INDEPENDENT_AMBULATORY_CARE_PROVIDER_SITE_OTHER): Payer: Medicare Other | Admitting: Family Medicine

## 2018-02-22 ENCOUNTER — Encounter (INDEPENDENT_AMBULATORY_CARE_PROVIDER_SITE_OTHER): Payer: Self-pay | Admitting: Family Medicine

## 2018-02-22 DIAGNOSIS — M545 Low back pain, unspecified: Secondary | ICD-10-CM

## 2018-02-22 NOTE — Progress Notes (Signed)
Office Visit Note   Patient: Hannah Mercado           Date of Birth: June 24, 1922           MRN: [id number] Visit Date: 02/22/2018 Requested by: Noralee Space, [place], [place] [address] PCP: Noralee Space, MD  Subjective: Chief Complaint  Patient presents with  . Lower Back - Pain    Pain started 2 days after lifting the corner of her sofa end of September of this year.  Has had problems since then. Worsened after making up a bed.    HPI: She is a 84 year old seen at the request of Dr. Lenna Gilford for lower back pain.  Symptoms started about 6-8 weeks ago, she recalls trying to lift up the corner of a couch and feeling sudden pain in her back.  It got significantly worse over the next couple days.  She has continued to have pain and eventually had x-rays done showing multiple lumbar compression fractures.  She does have a history of osteoporosis but has been on a "medication holiday".  She denies any radicular symptoms.  She is using a cane for support, and taking tramadol 3 times daily along with Tylenol.  This has helped significantly with her pain.  Today she feels very comfortable.               ROS: She has a history of hypertension, carotid artery disease, atrial fibrillation, and a pacemaker.  Objective: Vital Signs: There were no vitals taken for this visit.  Physical Exam:  Low back: No tenderness in the thoracic spinous processes.  Mild tenderness near the L3-4 level but not severe.  Mild tenderness in the SI joint areas bilaterally.  No pain with internal hip rotation, negative straight leg raise.  Lower extremity strength and reflexes are normal.  Imaging: X-rays brought with her on CD show L2, L3 and L4 compression fractures.  These were compared with CT scan from December 2018.  The L2 and L4 levels look similar to one year ago, but the L3 appears to be new.  Assessment & Plan: 1.  Low back pain probably due to subacute L3 compression  fracture -Clinically she is doing well with her current regimen.  I anticipate 2 to 4 months healing time.  If she gets significantly worse, we may take another x-ray.  Unable to do MRI due to pacemaker.  Could contemplate vertebroplasty if symptoms were severe, would probably need a limited bone scan to determine appropriate level. -Long-term she should avoid lifting over 10 pounds, bending or stooping.  She should walk regularly for exercise to improve bone strength and to decrease fall risk.   Follow-Up Instructions: No follow-ups on file.      Procedures: No procedures performed  No notes on file    PMFS History: Patient Active Problem List   Diagnosis Date Noted  . Back pain 02/21/2018  . Compression fracture of lumbar vertebra ([place]) 02/21/2018  . Osteoporosis 02/21/2018  . Pulmonary hypertension ([place]) 02/20/2018  . Cardiac pacemaker in situ 02/24/2016  . Atrial fibrillation ([place]) 10/16/2015  . Persistent atrial fibrillation   . Chronic venous insufficiency 09/11/2015  . Edema 09/11/2015  . Carotid artery disease ([place]) 03/12/2015  . Diverticulosis of colon without hemorrhage 08/02/2012  . UTI (lower urinary tract infection) 02/03/2012  . Hypertension 04/14/2011  . Ovarian cyst, left 12/29/2010  . pT2pN0 (0/12 lymph nodes) cM0 Rectal Cancer 11/27/2010  . Mononeuritis 07/31/2008  .  PULMONARY FIBROSIS, POSTINFLAMMATORY 07/31/2008  . Diaphragmatic hernia 07/31/2008  . Mitral valve disorder 04/20/2007  . HYPERCHOLESTEROLEMIA 04/19/2007  . Anxiety state 04/19/2007  . GERD 04/19/2007  . Osteoarthritis 04/19/2007  . ALLERGY 04/19/2007   Past Medical History:  Diagnosis Date  . Allergy history unknown   . Anxiety   . Asthmatic bronchitis    use inhaler prn   . Atrial fibrillation ([place]) 10/16/2015  . Blood transfusion    ? with ectopic pregnancy 50 yrs ago   . Cardiac pacemaker in situ 02/24/2016  . Carotid artery disease ([place]) 03/12/2015  . Chronic venous insufficiency  09/11/2015  . Diaphragmatic hernia 07/31/2008   Qualifier: Diagnosis of  By: Lenna Gilford MD, Deborra Medina   . Diverticulosis of colon without hemorrhage 08/02/2012  . DJD (degenerative joint disease)   . Edema 09/11/2015  . GERD (gastroesophageal reflux disease)   . Hiatal hernia   . History of pneumonia    right middle lobe  . Hypercholesteremia   . HYPERCHOLESTEROLEMIA 04/19/2007   Qualifier: Diagnosis of  By: Julien Girt CMA, Marliss Czar    . Hypertension 04/14/2011  . Mitral valve disorder 04/20/2007   Qualifier: Diagnosis of  By: Lenna Gilford MD, Deborra Medina   . Mitral valve prolapse    pt denies   . Osteopenia   . Persistent atrial fibrillation   . PULMONARY FIBROSIS, POSTINFLAMMATORY 07/31/2008   Qualifier: Diagnosis of  By: Lenna Gilford MD, Deborra Medina   . Pulmonary fibrosis, postinflammatory ([place])    pt denies   . Rectal cancer ([place])   . Right carotid bruit 08/28/2014    Family History  Problem Relation Age of Onset  . Heart disease Father   . Lung cancer Brother        x 2  . Colon cancer Neg Hx     Past Surgical History:  Procedure Laterality Date  . APPENDECTOMY    . COLON RESECTION  02/08/2011   Procedure: LAPAROSCOPIC SIGMOID COLON RESECTION;  Surgeon: Stark Klein, MD;  [facility];  Service: General;  Laterality: N/A;  Laparoscopic Lower Anterior Bowel Resection  . COLON SURGERY    . COLONOSCOPY  11/27/10   rectal cancer, small sigmoid adenoma, diverticulosis  . ECTOPIC PREGNANCY SURGERY     Midline incision  . EP IMPLANTABLE DEVICE N/A 10/16/2015   Procedure: Pacemaker Implant;  Surgeon: Will Meredith Leeds, MD;  Location: [facility];  Service: Cardiovascular;  Laterality: N/A;  . HAMMER TOE SURGERY  1995   Dr. Silverio Decamp  . Laparoscopic Left oophorectomy, laparoscopic low anterior resection, placement of OnQ pain pump  02/08/2011  . NASAL SINUS SURGERY  1994  . OVARIAN CYST REMOVAL  02/08/2011   Procedure: OVARIAN CYSTECTOMY;  Surgeon: Stark Klein, MD;  [facility];  Service:  General;  Laterality: Left;  Removal of Left Ovary and Pelvic Mass  . VAGINAL HYSTERECTOMY  1980's   Social History   Occupational History  . Occupation: housewife    Employer: RETIRED  Tobacco Use  . Smoking status: Never Smoker  . Smokeless tobacco: Never Used  Substance and Sexual Activity  . Alcohol use: No  . Drug use: No  . Sexual activity: Not on file

## 2018-02-22 NOTE — Patient Instructions (Signed)
   Diagnosis:  New compression fracture at L3.  There are old ones at L2 and L4, but they don't look significantly different from 1 year ago.   Treatment:  Tramadol as needed.  Avoid lifting over 5 pounds for the next few weeks, and avoid lifting over 10 pounds indefinitely.  Long-term:  When pain is gone, walk regularly for exercise.  This improves balance, strengthens bones, and decreases risk of falling.

## 2018-03-02 ENCOUNTER — Ambulatory Visit (INDEPENDENT_AMBULATORY_CARE_PROVIDER_SITE_OTHER): Payer: Medicare Other | Admitting: Pulmonary Disease

## 2018-03-02 ENCOUNTER — Encounter: Payer: Self-pay | Admitting: Pulmonary Disease

## 2018-03-02 VITALS — BP 120/64 | HR 64 | Temp 97.4°F | Ht 66.0 in | Wt 94.8 lb

## 2018-03-02 DIAGNOSIS — I4819 Other persistent atrial fibrillation: Secondary | ICD-10-CM

## 2018-03-02 DIAGNOSIS — I272 Pulmonary hypertension, unspecified: Secondary | ICD-10-CM

## 2018-03-02 DIAGNOSIS — K219 Gastro-esophageal reflux disease without esophagitis: Secondary | ICD-10-CM

## 2018-03-02 DIAGNOSIS — I6523 Occlusion and stenosis of bilateral carotid arteries: Secondary | ICD-10-CM

## 2018-03-02 DIAGNOSIS — M159 Polyosteoarthritis, unspecified: Secondary | ICD-10-CM

## 2018-03-02 DIAGNOSIS — C20 Malignant neoplasm of rectum: Secondary | ICD-10-CM | POA: Diagnosis not present

## 2018-03-02 DIAGNOSIS — Z950 Presence of cardiac pacemaker: Secondary | ICD-10-CM | POA: Diagnosis not present

## 2018-03-02 DIAGNOSIS — I059 Rheumatic mitral valve disease, unspecified: Secondary | ICD-10-CM | POA: Diagnosis not present

## 2018-03-02 DIAGNOSIS — M549 Dorsalgia, unspecified: Secondary | ICD-10-CM | POA: Diagnosis not present

## 2018-03-02 DIAGNOSIS — K5730 Diverticulosis of large intestine without perforation or abscess without bleeding: Secondary | ICD-10-CM | POA: Diagnosis not present

## 2018-03-02 DIAGNOSIS — J8410 Pulmonary fibrosis, unspecified: Secondary | ICD-10-CM

## 2018-03-02 DIAGNOSIS — F411 Generalized anxiety disorder: Secondary | ICD-10-CM

## 2018-03-02 DIAGNOSIS — I872 Venous insufficiency (chronic) (peripheral): Secondary | ICD-10-CM

## 2018-03-02 DIAGNOSIS — I10 Essential (primary) hypertension: Secondary | ICD-10-CM | POA: Diagnosis not present

## 2018-03-02 DIAGNOSIS — M8000XD Age-related osteoporosis with current pathological fracture, unspecified site, subsequent encounter for fracture with routine healing: Secondary | ICD-10-CM

## 2018-03-02 DIAGNOSIS — S32000D Wedge compression fracture of unspecified lumbar vertebra, subsequent encounter for fracture with routine healing: Secondary | ICD-10-CM

## 2018-03-02 DIAGNOSIS — M150 Primary generalized (osteo)arthritis: Secondary | ICD-10-CM

## 2018-03-02 MED ORDER — TRAMADOL HCL 50 MG PO TABS: 50.0000 mg | ORAL_TABLET | Freq: Three times a day (TID) | ORAL | 5 refills | Status: DC | PRN

## 2018-03-02 NOTE — Patient Instructions (Signed)
Today we updated your med list in our EPIC system...    Continue your current medications the same...     We refilled your TRAMADOL per request...  Please get a back brace that is easy on & off for you to wear for pain abatement & when traveling...  We are starting the [place] application for your osteoporosis...    When it is approved we will bring you in to teach you how to give the shots...  We also discussed your Oxygen therapy foir you PULMONARY HTPERTENSION...    You need to stay on the O2 at 2L/min evenings and all night long...    OK to leave it off some during the day & when you are out & about...  Finally we discussed getting you a new primary care doctor--    As you live near [place] rec the [employer] office:     DrBurns, DrKoberlein, DrHernandez are taking new pts

## 2018-03-07 ENCOUNTER — Ambulatory Visit: Payer: Medicare Other | Admitting: Family Medicine

## 2018-03-07 DIAGNOSIS — Z0289 Encounter for other administrative examinations: Secondary | ICD-10-CM

## 2018-03-13 ENCOUNTER — Telehealth: Payer: Self-pay | Admitting: Adult Health

## 2018-03-13 NOTE — Telephone Encounter (Signed)
Attempted to call Patient.  Unable to leave VM on cell and home number just rang.  Will attempt to call Patient later today.

## 2018-03-13 NOTE — Telephone Encounter (Signed)
Patient calling back to give her cell [phone].

## 2018-03-13 NOTE — Telephone Encounter (Signed)
Called and spoke with Patient.  She has the information and form needed for  [employer], Patient assistance.  She and her Daughter, are bringing info by the office 03/20/18, for me to go over, and send in.  Patient stated that she will be going to [place] for a few weeks, to stay with her Daughter.  O2 concentrator order placed for [employer].  Instructed Patient to call [employer] to get concentrator. [employer] is aware. Nothing further at this time.

## 2018-03-18 ENCOUNTER — Emergency Department (HOSPITAL_COMMUNITY): Payer: Medicare Other

## 2018-03-18 ENCOUNTER — Emergency Department (HOSPITAL_COMMUNITY)
Admission: EM | Admit: 2018-03-18 | Discharge: 2018-03-18 | Disposition: A | Discharge status: Home / Self Care | Payer: Medicare Other | Attending: Emergency Medicine | Admitting: Emergency Medicine

## 2018-03-18 ENCOUNTER — Encounter (HOSPITAL_COMMUNITY): Payer: Self-pay

## 2018-03-18 DIAGNOSIS — R55 Syncope and collapse: Secondary | ICD-10-CM | POA: Diagnosis not present

## 2018-03-18 DIAGNOSIS — I4891 Unspecified atrial fibrillation: Secondary | ICD-10-CM | POA: Insufficient documentation

## 2018-03-18 DIAGNOSIS — Y92009 Unspecified place in unspecified non-institutional (private) residence as the place of occurrence of the external cause: Secondary | ICD-10-CM | POA: Insufficient documentation

## 2018-03-18 DIAGNOSIS — Y999 Unspecified external cause status: Secondary | ICD-10-CM | POA: Diagnosis not present

## 2018-03-18 DIAGNOSIS — S01112A Laceration without foreign body of left eyelid and periocular area, initial encounter: Secondary | ICD-10-CM | POA: Diagnosis not present

## 2018-03-18 DIAGNOSIS — W01198A Fall on same level from slipping, tripping and stumbling with subsequent striking against other object, initial encounter: Secondary | ICD-10-CM | POA: Insufficient documentation

## 2018-03-18 DIAGNOSIS — S32020A Wedge compression fracture of second lumbar vertebra, initial encounter for closed fracture: Secondary | ICD-10-CM | POA: Diagnosis not present

## 2018-03-18 DIAGNOSIS — S50812A Abrasion of left forearm, initial encounter: Secondary | ICD-10-CM | POA: Diagnosis not present

## 2018-03-18 DIAGNOSIS — S0181XA Laceration without foreign body of other part of head, initial encounter: Secondary | ICD-10-CM

## 2018-03-18 DIAGNOSIS — Z23 Encounter for immunization: Secondary | ICD-10-CM | POA: Insufficient documentation

## 2018-03-18 DIAGNOSIS — I10 Essential (primary) hypertension: Secondary | ICD-10-CM | POA: Diagnosis not present

## 2018-03-18 DIAGNOSIS — W19XXXA Unspecified fall, initial encounter: Secondary | ICD-10-CM

## 2018-03-18 DIAGNOSIS — S41111A Laceration without foreign body of right upper arm, initial encounter: Secondary | ICD-10-CM | POA: Diagnosis not present

## 2018-03-18 DIAGNOSIS — Y9389 Activity, other specified: Secondary | ICD-10-CM | POA: Diagnosis not present

## 2018-03-18 DIAGNOSIS — S41112A Laceration without foreign body of left upper arm, initial encounter: Secondary | ICD-10-CM

## 2018-03-18 DIAGNOSIS — Z79899 Other long term (current) drug therapy: Secondary | ICD-10-CM | POA: Diagnosis not present

## 2018-03-18 DIAGNOSIS — S299XXA Unspecified injury of thorax, initial encounter: Secondary | ICD-10-CM | POA: Diagnosis not present

## 2018-03-18 DIAGNOSIS — S32030A Wedge compression fracture of third lumbar vertebra, initial encounter for closed fracture: Secondary | ICD-10-CM | POA: Diagnosis not present

## 2018-03-18 DIAGNOSIS — S51812A Laceration without foreign body of left forearm, initial encounter: Secondary | ICD-10-CM | POA: Insufficient documentation

## 2018-03-18 DIAGNOSIS — S0990XA Unspecified injury of head, initial encounter: Secondary | ICD-10-CM | POA: Diagnosis present

## 2018-03-18 DIAGNOSIS — Z7901 Long term (current) use of anticoagulants: Secondary | ICD-10-CM | POA: Diagnosis not present

## 2018-03-18 LAB — BASIC METABOLIC PANEL
Anion gap: 10 (ref 5–15)
BUN: 20 mg/dL (ref 8–23)
CO2: 26 mmol/L (ref 22–32)
Calcium: 9 mg/dL (ref 8.9–10.3)
Chloride: 102 mmol/L (ref 98–111)
Creatinine, Ser: 1.08 mg/dL — ABNORMAL HIGH (ref 0.44–1.00)
GFR calc Af Amer: 51 mL/min — ABNORMAL LOW (ref >60)
GFR calc non Af Amer: 44 mL/min — ABNORMAL LOW (ref >60)
GLUCOSE: 126 mg/dL — AB (ref 70–99)
Potassium: 4.4 mmol/L (ref 3.5–5.1)
Sodium: 138 mmol/L (ref 135–145)

## 2018-03-18 LAB — CBC
HCT: 40.6 % (ref 36.0–46.0)
Hemoglobin: 12.7 g/dL (ref 12.0–15.0)
MCH: 33.7 pg (ref 26.0–34.0)
MCHC: 31.3 g/dL (ref 30.0–36.0)
MCV: 107.7 fL — AB (ref 80.0–100.0)
Platelets: 251 10*3/uL (ref 150–400)
RBC: 3.77 MIL/uL — ABNORMAL LOW (ref 3.87–5.11)
RDW: 15.4 % (ref 11.5–15.5)
WBC: 8.2 10*3/uL (ref 4.0–10.5)
nRBC: 0 % (ref 0.0–0.2)

## 2018-03-18 LAB — HEPATIC FUNCTION PANEL
ALT: 12 U/L (ref 0–44)
AST: 21 U/L (ref 15–41)
Albumin: 3.9 g/dL (ref 3.5–5.0)
Alkaline Phosphatase: 74 U/L (ref 38–126)
Bilirubin, Direct: 0.3 mg/dL — ABNORMAL HIGH (ref 0.0–0.2)
Indirect Bilirubin: 0.5 mg/dL (ref 0.3–0.9)
Total Bilirubin: 0.8 mg/dL (ref 0.3–1.2)
Total Protein: 7.5 g/dL (ref 6.5–8.1)

## 2018-03-18 LAB — TROPONIN I: Troponin I: <0.03 ng/mL (ref <0.03)

## 2018-03-18 LAB — APTT: aPTT: 24 seconds (ref 24–36)

## 2018-03-18 LAB — PROTIME-INR
INR: 0.94
Prothrombin Time: 12.5 seconds (ref 11.4–15.2)

## 2018-03-18 MED ORDER — TETANUS-DIPHTH-ACELL PERTUSSIS 5-2.5-18.5 LF-MCG/0.5 IM SUSP
0.5000 mL | Freq: Once | INTRAMUSCULAR | Status: AC
  Administered 2018-03-18: 0.5 mL via INTRAMUSCULAR
  Filled 2018-03-18: qty 0.5

## 2018-03-18 NOTE — ED Notes (Signed)
Patient returned from X-ray 

## 2018-03-18 NOTE — ED Notes (Signed)
Pt transported to CT ?

## 2018-03-18 NOTE — ED Triage Notes (Signed)
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 Pt is on eliquis. PERRL and no neuro deficits. Pt doesn't remember falling, just remembers waking up on the floor. Has a pacemaker. VSS.

## 2018-03-18 NOTE — ED Notes (Signed)
Pt family stepped out for food. Call daughter at [phone] [name]) or call son-in-law at [phone] Harrie Jeans)

## 2018-03-18 NOTE — ED Notes (Signed)
Large abrasion noted to L forearm

## 2018-03-18 NOTE — ED Notes (Signed)
Phone call received from [facility], states that pacemaker is operating as it should.

## 2018-03-18 NOTE — ED Notes (Signed)
Patient not in room to collect labs.

## 2018-03-18 NOTE — ED Provider Notes (Signed)
[facility] Provider Note   CSN: [id number] Arrival date & time: 03/18/18  1441     History   Chief Complaint Chief Complaint  Patient presents with  . Fall    HPI Hannah Mercado is a 84 y.o. female.  The history is provided by the patient and a relative. No language interpreter was used.  Fall  This is a new problem. The current episode started less than 1 hour ago. The problem occurs rarely. Pertinent negatives include no chest pain, no abdominal pain, no headaches and no shortness of breath.   Patient is a-year-old female with a past medical history of A. fib currently on Eliquis pacemaker,, DJD, GERD, osteopenia, recent fall with 2-3 weeks with known lumbar fractures who presents for evaluation of a fall. Patient reportedly woke up on the ground and does not remember the details of the fall. The fall was not witnessed but family immediately went into the room where the pt was located when they heard a loud sound. They state they found the pt on the floor and conscious and alert. Patient is unable to provide any additional history regarding the circumstances of her fall. She denies any recent fevers, chills, HA, vision changes, CP, SOB, Abd Pain, N/V/D, dysuria, focal extremity weakness, numbness, rash, or other acute complaints. Denies any alleviating or aggravating factors for her left eyelid and left forearm pain. Denies pain at any other site.    Past Medical History:  Diagnosis Date  . Allergy history unknown   . Anxiety   . Asthmatic bronchitis    use inhaler prn   . Atrial fibrillation ([place]) 10/16/2015  . Blood transfusion    ? with ectopic pregnancy 50 yrs ago   . Cardiac pacemaker in situ 02/24/2016  . Carotid artery disease ([place]) 03/12/2015  . Chronic venous insufficiency 09/11/2015  . Diaphragmatic hernia 07/31/2008   Qualifier: Diagnosis of  By: Lenna Gilford MD, Deborra Medina   . Diverticulosis of colon without hemorrhage 08/02/2012    . DJD (degenerative joint disease)   . Edema 09/11/2015  . GERD (gastroesophageal reflux disease)   . Hiatal hernia   . History of pneumonia    right middle lobe  . Hypercholesteremia   . HYPERCHOLESTEROLEMIA 04/19/2007   Qualifier: Diagnosis of  By: Julien Girt CMA, Marliss Czar    . Hypertension 04/14/2011  . Mitral valve disorder 04/20/2007   Qualifier: Diagnosis of  By: Lenna Gilford MD, Deborra Medina   . Mitral valve prolapse    pt denies   . Osteopenia   . Persistent atrial fibrillation   . PULMONARY FIBROSIS, POSTINFLAMMATORY 07/31/2008   Qualifier: Diagnosis of  By: Lenna Gilford MD, Deborra Medina   . Pulmonary fibrosis, postinflammatory ([place])    pt denies   . Rectal cancer ([place])   . Right carotid bruit 08/28/2014    Patient Active Problem List   Diagnosis Date Noted  . Back pain 02/21/2018  . Compression fracture of lumbar vertebra ([place]) 02/21/2018  . Osteoporosis 02/21/2018  . Pulmonary hypertension ([place]) 02/20/2018  . Cardiac pacemaker in situ 02/24/2016  . Atrial fibrillation ([place]) 10/16/2015  . Persistent atrial fibrillation   . Chronic venous insufficiency 09/11/2015  . Edema 09/11/2015  . Carotid artery disease ([place]) 03/12/2015  . Diverticulosis of colon without hemorrhage 08/02/2012  . UTI (lower urinary tract infection) 02/03/2012  . Hypertension 04/14/2011  . Ovarian cyst, left 12/29/2010  . pT2pN0 (0/12 lymph nodes) cM0 Rectal Cancer 11/27/2010  . Mononeuritis 07/31/2008  .  PULMONARY FIBROSIS, POSTINFLAMMATORY 07/31/2008  . Diaphragmatic hernia 07/31/2008  . Mitral valve disorder 04/20/2007  . HYPERCHOLESTEROLEMIA 04/19/2007  . Anxiety state 04/19/2007  . GERD 04/19/2007  . Osteoarthritis 04/19/2007  . ALLERGY 04/19/2007    Past Surgical History:  Procedure Laterality Date  . APPENDECTOMY    . COLON RESECTION  02/08/2011   Procedure: LAPAROSCOPIC SIGMOID COLON RESECTION;  Surgeon: Stark Klein, MD;  Location: [facility];  Service: General;  Laterality: N/A;  Laparoscopic Lower Anterior  Bowel Resection  . COLON SURGERY    . COLONOSCOPY  11/27/10   rectal cancer, small sigmoid adenoma, diverticulosis  . ECTOPIC PREGNANCY SURGERY     Midline incision  . EP IMPLANTABLE DEVICE N/A 10/16/2015   Procedure: Pacemaker Implant;  Surgeon: Will Meredith Leeds, MD;  Location: [facility];  Service: Cardiovascular;  Laterality: N/A;  . HAMMER TOE SURGERY  1995   Dr. Silverio Decamp  . Laparoscopic Left oophorectomy, laparoscopic low anterior resection, placement of OnQ pain pump  02/08/2011  . NASAL SINUS SURGERY  1994  . OVARIAN CYST REMOVAL  02/08/2011   Procedure: OVARIAN CYSTECTOMY;  Surgeon: Stark Klein, MD;  Location: [facility];  Service: General;  Laterality: Left;  Removal of Left Ovary and Pelvic Mass  . VAGINAL HYSTERECTOMY  1980's     OB History   No obstetric history on file.      Home Medications    Prior to Admission medications   Medication Sig Start Date End Date Taking? Authorizing Provider  albuterol (PROVENTIL HFA;VENTOLIN HFA) 108 (90 BASE) MCG/ACT inhaler Inhale 2 puffs into the lungs every 6 (six) hours as needed. Wheezing  10/09/10   Noralee Space, MD  amLODipine (NORVASC) 5 MG tablet TAKE 1 TABLET BY MOUTH EVERY DAY 01/30/18   Noralee Space, MD  ascorbic acid (VITAMIN C) 250 MG CHEW Chew 250 mg by mouth daily.     [provider]  atorvastatin (LIPITOR) 20 MG tablet Take 0.5 tablets (10 mg total) by mouth daily. 09/08/17   Noralee Space, MD  Calcium Carbonate-Vitamin D (CALCIUM-VITAMIN D) 500-200 MG-UNIT per tablet Take 2 tablets by mouth daily.     [provider]  Cholecalciferol (VITAMIN D3) 1000 UNITS CAPS Take 1 capsule by mouth daily.     [provider]  cyclobenzaprine (FLEXERIL) 5 MG tablet Take 2.5 mg by mouth at bedtime. 02/06/18   [provider]  ELIQUIS 2.5 MG TABS tablet TAKE 1 TABLET BY MOUTH TWICE A DAY 01/02/18   Camnitz, Ocie Doyne, MD  ferrous sulfate 325 (65 FE) MG tablet Take 325 mg by mouth  daily with breakfast.    [provider]  furosemide (LASIX) 20 MG tablet Take 1 tablet (20 mg total) by mouth as needed (swelling). 09/08/17   Noralee Space, MD  gabapentin (NEURONTIN) 100 MG capsule Take 1-3 tabs as directed at bedtime 08/31/17   Noralee Space, MD  loperamide (LOPERAMIDE A-D) 2 MG tablet Take 2 mg by mouth every 6 (six) hours as needed.    [provider]  omeprazole (PRILOSEC) 20 MG capsule Take 20 mg by mouth daily.     [provider]  traMADol (ULTRAM) 50 MG tablet Take 1 tablet (50 mg total) by mouth 3 (three) times daily as needed for moderate pain. 03/02/18   Noralee Space, MD  vitamin B-12 (CYANOCOBALAMIN) 250 MCG tablet Take 250 mcg by mouth daily.     [provider]  vitamin E (VITAMIN  E) 200 UNIT capsule Take 200 Units by mouth daily.     [provider]    Family History Family History  Problem Relation Age of Onset  . Heart disease Father   . Lung cancer Brother        x 2  . Colon cancer Neg Hx     Social History Social History   Tobacco Use  . Smoking status: Never Smoker  . Smokeless tobacco: Never Used  Substance Use Topics  . Alcohol use: No  . Drug use: No     Allergies   Sulfonamide derivatives   Review of Systems Review of Systems  Constitutional: Negative for chills and fever.  HENT: Negative for ear pain and sore throat.   Eyes: Negative for pain and visual disturbance.  Respiratory: Negative for cough and shortness of breath.   Cardiovascular: Negative for chest pain and palpitations.  Gastrointestinal: Negative for abdominal pain and vomiting.  Genitourinary: Negative for dysuria and hematuria.  Musculoskeletal: Negative for arthralgias and back pain.  Skin: Positive for wound ( over left eye and left forearm). Negative for color change and rash.  Neurological: Negative for seizures, syncope and headaches.  All other systems reviewed and are negative.    Physical Exam Updated  Vital Signs BP 140/75 (BP Location: Right Arm)   Pulse 64   Temp 97.8 F (36.6 C) (Oral)   Resp 16   SpO2 100%   Physical Exam Vitals signs and nursing note reviewed.  Constitutional:      General: She is not in acute distress.    Appearance: She is well-developed.  HENT:     Head: Normocephalic and atraumatic.     Right Ear: External ear normal.     Left Ear: External ear normal.     Nose: Nose normal.     Mouth/Throat:     Pharynx: Oropharynx is clear.  Eyes:     Conjunctiva/sclera: Conjunctivae normal.  Neck:     Musculoskeletal: Neck supple.  Cardiovascular:     Rate and Rhythm: Normal rate and regular rhythm.     Heart sounds: No murmur.  Pulmonary:     Effort: Pulmonary effort is normal. No respiratory distress.     Breath sounds: Normal breath sounds.  Abdominal:     Palpations: Abdomen is soft.     Tenderness: There is no abdominal tenderness.  Musculoskeletal:     Cervical back: She exhibits no bony tenderness.     Thoracic back: She exhibits no bony tenderness.     Lumbar back: She exhibits no bony tenderness.  Skin:    General: Skin is warm and dry.     Capillary Refill: Capillary refill takes less than 2 seconds.     Findings: Laceration ( pt has a .5cm laceration over her left eye as well as 2x superficial hemostatic skin tears over her left forearm ) present.  Neurological:     General: No focal deficit present.     Mental Status: She is alert.      ED Treatments / Results  Labs (all labs ordered are listed, but only abnormal results are displayed) Labs Reviewed  BASIC METABOLIC PANEL - Abnormal; Notable for the following components:      Result Value   Glucose, Bld 126 (*)    Creatinine, Ser 1.08 (*)    GFR calc non Af Amer 44 (*)    GFR calc Af Amer 51 (*)    All other components within normal limits  CBC - Abnormal; Notable for the following components:   RBC 3.77 (*)    MCV 107.7 (*)    All other components within normal limits  HEPATIC  FUNCTION PANEL - Abnormal; Notable for the following components:   Bilirubin, Direct 0.3 (*)    All other components within normal limits  APTT  PROTIME-INR  TROPONIN I    EKG EKG Interpretation  Date/Time:  Saturday March 18 2018 14:56:25 EST Ventricular Rate:  61 PR Interval:    QRS Duration: 158 QT Interval:  468 QTC Calculation: 471 R Axis:   -84 Text Interpretation:  Electronic ventricular pacemaker No significant change since last tracing Confirmed by Wandra Arthurs [id number]) on 03/18/2018 3:47:00 PM   Radiology Dg Lumbar Spine Complete  Result Date: 03/18/2018 CLINICAL DATA:  Low back pain after fall at home. EXAM: LUMBAR SPINE - COMPLETE 4+ VIEW COMPARISON:  CT scan of March 07, 2017. FINDINGS: Osteopenia is noted. Old L4 compression fracture is noted. Interval development severe compression deformities involving L2 and L3 vertebral bodies is noted consistent with fractures of indeterminate age. No spondylolisthesis is noted. Disc spaces are well-maintained. IMPRESSION: Stable old L4 compression fracture is noted. New L2 and L3 compression fractures are noted of indeterminate age; MRI may be performed for further evaluation. Electronically Signed   By: Marijo Conception, M.D.   On: 03/18/2018 19:33   Dg Forearm Left  Result Date: 03/18/2018 CLINICAL DATA:  Pain and skin tears and abrasions of the left forearm secondary to a fall today. EXAM: LEFT FOREARM - 2 VIEW COMPARISON:  None. FINDINGS: There is no fracture or dislocation. Slight arthritic changes of the radiocarpal joint. Benign-appearing calcifications in the soft tissues posterior to the proximal ulna. IMPRESSION: No acute abnormality. Electronically Signed   By: Lorriane Shire M.D.   On: 03/18/2018 16:33   Ct Head Wo Contrast  Result Date: 03/18/2018 CLINICAL DATA:  84 year old female with history of syncope, found on the floor. Laceration to the left temple. EXAM: CT HEAD WITHOUT CONTRAST CT MAXILLOFACIAL WITHOUT  CONTRAST CT CERVICAL SPINE WITHOUT CONTRAST TECHNIQUE: Multidetector CT imaging of the head, cervical spine, and maxillofacial structures were performed using the standard protocol without intravenous contrast. Multiplanar CT image reconstructions of the cervical spine and maxillofacial structures were also generated. COMPARISON:  No priors. FINDINGS: CT HEAD FINDINGS Brain: Moderate cerebral and cerebellar atrophy. Patchy and confluent areas of decreased attenuation are noted throughout the deep and periventricular white matter of the cerebral hemispheres bilaterally, compatible with chronic microvascular ischemic disease. Faint physiologic calcifications in the basal ganglia bilaterally (left greater than right). No evidence of acute infarction, hemorrhage, hydrocephalus, extra-axial collection or mass lesion/mass effect. Vascular: No hyperdense vessel or unexpected calcification. Skull: Normal. Negative for fracture or focal lesion. Other: None. CT MAXILLOFACIAL FINDINGS Osseous: No fracture or mandibular dislocation. No destructive process. Orbits: Negative. No traumatic or inflammatory finding. Sinuses: 6 mm lesion in the medial aspect of the left maxillary sinus may represent a small mucosal retention cyst or polyp. Multifocal mucosal thickening in the posterior ethmoid sinuses bilaterally. Expansion of a left posterior ethmoidal cell, compatible with a mucocele. Soft tissues: Negative. CT CERVICAL SPINE FINDINGS Alignment: Normal. Skull base and vertebrae: Chronic appearing compression fracture of C7 with 30% loss of anterior vertebral body height. No acute fracture. No primary bone lesion or focal pathologic process. Soft tissues and spinal canal: No prevertebral fluid or swelling. No visible canal hematoma. Disc levels: Mild multilevel degenerative disc disease and facet arthropathy.  Upper chest: Negative. Other: None. IMPRESSION: 1. No evidence of significant acute traumatic injury to the skull, brain,  facial bones or cervical spine. 2. Moderate cerebral atrophy with chronic microvascular ischemic changes in the cerebral white matter, as above. 3. Mild multilevel degenerative disc disease and cervical spondylosis. 4. Chronic paranasal sinus disease, as above. Electronically Signed   By: Vinnie Langton M.D.   On: 03/18/2018 16:26   Ct Cervical Spine Wo Contrast  Result Date: 03/18/2018 CLINICAL DATA:  84 year old female with history of syncope, found on the floor. Laceration to the left temple. EXAM: CT HEAD WITHOUT CONTRAST CT MAXILLOFACIAL WITHOUT CONTRAST CT CERVICAL SPINE WITHOUT CONTRAST TECHNIQUE: Multidetector CT imaging of the head, cervical spine, and maxillofacial structures were performed using the standard protocol without intravenous contrast. Multiplanar CT image reconstructions of the cervical spine and maxillofacial structures were also generated. COMPARISON:  No priors. FINDINGS: CT HEAD FINDINGS Brain: Moderate cerebral and cerebellar atrophy. Patchy and confluent areas of decreased attenuation are noted throughout the deep and periventricular white matter of the cerebral hemispheres bilaterally, compatible with chronic microvascular ischemic disease. Faint physiologic calcifications in the basal ganglia bilaterally (left greater than right). No evidence of acute infarction, hemorrhage, hydrocephalus, extra-axial collection or mass lesion/mass effect. Vascular: No hyperdense vessel or unexpected calcification. Skull: Normal. Negative for fracture or focal lesion. Other: None. CT MAXILLOFACIAL FINDINGS Osseous: No fracture or mandibular dislocation. No destructive process. Orbits: Negative. No traumatic or inflammatory finding. Sinuses: 6 mm lesion in the medial aspect of the left maxillary sinus may represent a small mucosal retention cyst or polyp. Multifocal mucosal thickening in the posterior ethmoid sinuses bilaterally. Expansion of a left posterior ethmoidal cell, compatible with a  mucocele. Soft tissues: Negative. CT CERVICAL SPINE FINDINGS Alignment: Normal. Skull base and vertebrae: Chronic appearing compression fracture of C7 with 30% loss of anterior vertebral body height. No acute fracture. No primary bone lesion or focal pathologic process. Soft tissues and spinal canal: No prevertebral fluid or swelling. No visible canal hematoma. Disc levels: Mild multilevel degenerative disc disease and facet arthropathy. Upper chest: Negative. Other: None. IMPRESSION: 1. No evidence of significant acute traumatic injury to the skull, brain, facial bones or cervical spine. 2. Moderate cerebral atrophy with chronic microvascular ischemic changes in the cerebral white matter, as above. 3. Mild multilevel degenerative disc disease and cervical spondylosis. 4. Chronic paranasal sinus disease, as above. Electronically Signed   By: Vinnie Langton M.D.   On: 03/18/2018 16:26   Dg Chest Portable 1 View  Result Date: 03/18/2018 CLINICAL DATA:  Fall today. Pt does not remember fall and thinks she must have fainted. Pt denies chest pain and SOB. Hx of HTN, asthma, CAD, pulmonary fibrosis. EXAM: PORTABLE CHEST 1 VIEW COMPARISON:  08/24/2016 FINDINGS: The lungs are hyperinflated. Patient has a LEFT-sided transvenous pacemaker with lead off the image at least to level of the RIGHT ventricle. Heart is enlarged. There is prominence of the pulmonary artery segments consistent with probable pulmonary arterial hypertension. Emphysematous changes are stable. There is mild scarring at both lung bases. No pulmonary edema. There is no pneumothorax. No acute displaced rib fractures. Deformity of LEFT-sided ribs is consistent with remote injury. IMPRESSION: 1. Cardiomegaly and pulmonary arterial hypertension. 2. No evidence for acute abnormality. 3. No acute rib fracture or bone destruction. Electronically Signed   By: Nolon Nations M.D.   On: 03/18/2018 16:25   Ct Maxillofacial Wo Contrast  Result Date:  03/18/2018 CLINICAL DATA:  84 year old female with history of  syncope, found on the floor. Laceration to the left temple. EXAM: CT HEAD WITHOUT CONTRAST CT MAXILLOFACIAL WITHOUT CONTRAST CT CERVICAL SPINE WITHOUT CONTRAST TECHNIQUE: Multidetector CT imaging of the head, cervical spine, and maxillofacial structures were performed using the standard protocol without intravenous contrast. Multiplanar CT image reconstructions of the cervical spine and maxillofacial structures were also generated. COMPARISON:  No priors. FINDINGS: CT HEAD FINDINGS Brain: Moderate cerebral and cerebellar atrophy. Patchy and confluent areas of decreased attenuation are noted throughout the deep and periventricular white matter of the cerebral hemispheres bilaterally, compatible with chronic microvascular ischemic disease. Faint physiologic calcifications in the basal ganglia bilaterally (left greater than right). No evidence of acute infarction, hemorrhage, hydrocephalus, extra-axial collection or mass lesion/mass effect. Vascular: No hyperdense vessel or unexpected calcification. Skull: Normal. Negative for fracture or focal lesion. Other: None. CT MAXILLOFACIAL FINDINGS Osseous: No fracture or mandibular dislocation. No destructive process. Orbits: Negative. No traumatic or inflammatory finding. Sinuses: 6 mm lesion in the medial aspect of the left maxillary sinus may represent a small mucosal retention cyst or polyp. Multifocal mucosal thickening in the posterior ethmoid sinuses bilaterally. Expansion of a left posterior ethmoidal cell, compatible with a mucocele. Soft tissues: Negative. CT CERVICAL SPINE FINDINGS Alignment: Normal. Skull base and vertebrae: Chronic appearing compression fracture of C7 with 30% loss of anterior vertebral body height. No acute fracture. No primary bone lesion or focal pathologic process. Soft tissues and spinal canal: No prevertebral fluid or swelling. No visible canal hematoma. Disc levels: Mild  multilevel degenerative disc disease and facet arthropathy. Upper chest: Negative. Other: None. IMPRESSION: 1. No evidence of significant acute traumatic injury to the skull, brain, facial bones or cervical spine. 2. Moderate cerebral atrophy with chronic microvascular ischemic changes in the cerebral white matter, as above. 3. Mild multilevel degenerative disc disease and cervical spondylosis. 4. Chronic paranasal sinus disease, as above. Electronically Signed   By: Vinnie Langton M.D.   On: 03/18/2018 16:26    Procedures .Marland KitchenLaceration Repair Date/Time: 03/19/2018 2:12 AM Performed by: Hulan Saas, MD Authorized by: Hulan Saas, MD   Consent:    Consent obtained:  Verbal   Consent given by:  Patient   Risks discussed:  Pain   Alternatives discussed:  No treatment Anesthesia (see MAR for exact dosages):    Anesthesia method:  None Laceration details:    Location:  Face   Face location:  L eyebrow   Length (cm):  0.5 Repair type:    Repair type:  Simple Exploration:    Contaminated: no   Treatment:    Area cleansed with:  Soap and water   Amount of cleaning:  Standard   Irrigation solution:  Sterile saline   Irrigation method:  Syringe   Visualized foreign bodies/material removed: no   Skin repair:    Repair method:  Tissue adhesive Approximation:    Approximation:  Close Post-procedure details:    Dressing:  Open (no dressing)   Patient tolerance of procedure:  Tolerated well, no immediate complications   (including critical care time)  Medications Ordered in ED Medications  Tdap (BOOSTRIX) injection 0.5 mL (0.5 mLs Intramuscular Given 03/18/18 1641)     Initial Impression / Assessment and Plan / ED Course  I have reviewed the triage vital signs and the nursing notes.  Pertinent labs & imaging results that were available during my care of the patient were reviewed by me and considered in my medical decision making (see chart for details).     Patient is  a  84 year old F who presents w/ above history and exam for evaluation of a GLF that occcured immediately prior to arrival. On presentation, pt is afebrile w/ stable VS. Exam as above remarkable for laceration over left eyelid and left forearm. Eyelid lac repaired per procedure note above. No other obvious signs of traumatic injury on exam.   CT Head, Face, and C Spine remarkable for "No evidence of significant acute traumatic injury to the skull, brain, facial bones or cervical spine. Moderate cerebral atrophy with chronic microvascular ischemic changes in the cerebral white matter, as above.  Mild multilevel degenerative disc disease and cervical Spondylosis. Chronic paranasal sinus disease."  CXR remarkable for "Cardiomegaly and pulmonary arterial hypertension. No evidence for acute abnormality. No acute rib fracture or bone destruction."  XR L Forearm No acute abnormality.  XR L Spine "Stable old L4 compression fracture is noted. New L2 and L3 compression fractures are noted of indeterminate age." Given pt has known lumbar compression fractures and no spinous process TTP, I have a low suspicion for acute fracture.   Given age and risk factors and ECG was obtained that shows a paced rhythm with no signs of acute ischemic change and unchanged from prior tracings. Low suspicion for ACS given pt denies CP or SOB, ECG unchanged from prior, and troponin undetectable. CBC shows WBC 8.2, Hbg 12.7, Plt 251. No significant electrolyte or metabolic abnormalities on BMP.   Given stable vitals, absence of any focal neuro deficits or exam findings concerning for occult traumt, unremarkable trauma workup, unremarkable labs and non-elevated troponin w/ unchanged ECG I believe the pt is safe for d/c with close outpatient f/u.    Patient discharged in stable condition. Strict return precautions provided. Instructed to f/u w/ PCP in 2 days. Pt and caregivers (family members) voiced understanding and agreement w/  this plan.   Final Clinical Impressions(s) / ED Diagnoses   Final diagnoses:  Fall, initial encounter  Arm laceration, left, initial encounter  Facial laceration, initial encounter    ED Discharge Orders    None       Hulan Saas, MD 03/19/18 Natasha Mead    Drenda Freeze, MD 03/21/18 1949

## 2018-03-20 ENCOUNTER — Encounter: Payer: Self-pay | Admitting: *Deleted

## 2018-03-21 ENCOUNTER — Encounter: Payer: Self-pay | Admitting: Pulmonary Disease

## 2018-03-21 NOTE — Progress Notes (Signed)
Patient returned forms for Forteo injection Patient assistance, 03/20/18.  Patient and her Daughter Hannah Mercado went over forms, and  signed.  Brought financial information needed.  Formed signed by Dr. Lenna Gilford, with Forteo dosage instructions.  Faxed signed forms with financial information  to [facility] Patient assistance Program, [phone], confirmation received.  Patient is going to stay with her Daughter in [place], leaving Thursday, 03/23/18, for a few weeks. Hannah Mercado (Patient Daughter) phone number is [phone], and address is [address] [place] [address].  Will follow up.

## 2018-03-27 ENCOUNTER — Telehealth: Payer: Self-pay | Admitting: Adult Health

## 2018-03-27 MED ORDER — TERIPARATIDE (RECOMBINANT) 600 MCG/2.4ML ~~LOC~~ SOLN: 20.0000 ug | Freq: Every day | SUBCUTANEOUS | 12 refills | Status: DC

## 2018-03-27 NOTE — Telephone Encounter (Signed)
Called and spoke with Hannah Mercado Patient assistance for Forteo at [phone].  He stated that they needed a printed prescription for Forteo to be signed and faxed to [phone]. Printed Forteo prescription, signed by Dr. Lenna Gilford, and faxed to [facility] Patient assistance.  Confirmation received.  Attempted to call Patient's Daughter, Hannah Mercado.  Left a detailed message about fax, and I will keep her updated on progress.  Will continue to follow up this week and update Patient ans Daughter.

## 2018-03-30 ENCOUNTER — Telehealth: Payer: Self-pay | Admitting: Pulmonary Disease

## 2018-03-30 MED ORDER — ALENDRONATE SODIUM 70 MG PO TABS: 70.0000 mg | ORAL_TABLET | ORAL | 3 refills | Status: DC

## 2018-03-30 NOTE — Telephone Encounter (Signed)
Per SN- See if they can file 2019 income tax, may be best.  If not, Reclast 5mg  IV, once per year, via infusion clinic, may have Patient assistance available, or the next option is Alendronate (fosamax), 70mg , once per week. Called and spoke with [name], Patient's Daughter.  She stated that they would try the Alendronate, once weekly.  Patient is currently in [place] with her Daughter.  Requested pharmacy is [facility] [place]. Per SN- Alendronate 70mg  tabs, #12, with 3 refills, take 1 tab PO every week, first thing in the AM, give the same day of the week, take by itself with a full glass of water, wait 1 hour before eating.  Called and spoke with [employer].  SN instructions given.  Understanding stated. Prescription sent to requested pharmacy.  Nothing further at this time.

## 2018-03-30 NOTE — Telephone Encounter (Signed)
Called [name] Patient assistance to follow up on Forteo.  Spoke with [name].  She stated that Patient was denied, because she makes $38,000/ yearand needs to be under $37,000/year.  [name] recommended if she does her taxes, to send in her 1099 form.  Patient was told she did not have to do taxes for 2018. Called and spoke with Patient's Daughter, [name], informed her of denial. She stated understanding, but would like her Mother on something for her osteoporosis.   Will follow up with Dr. Lenna Gilford

## 2018-04-07 LAB — CUP PACEART REMOTE DEVICE CHECK
Battery Remaining Longevity: 127 mo
Battery Remaining Percentage: 95.5 %
Battery Voltage: 3.01 V
Brady Statistic RV Percent Paced: 97 %
Implantable Lead Implant Date: 20170727
Implantable Lead Location: 753860
Lead Channel Impedance Value: 490 Ohm
Lead Channel Pacing Threshold Amplitude: 0.5 V
Lead Channel Pacing Threshold Pulse Width: 0.4 ms
Lead Channel Sensing Intrinsic Amplitude: 12 mV
Lead Channel Setting Pacing Amplitude: 2.5 V
Lead Channel Setting Pacing Pulse Width: 0.4 ms
Lead Channel Setting Sensing Sensitivity: 2 mV
MDC IDC PG IMPLANT DT: 20170727
MDC IDC SESS DTM: 20191127024801
Pulse Gen Model: 1272
Pulse Gen Serial Number: 7897007

## 2018-04-21 ENCOUNTER — Telehealth: Payer: Self-pay | Admitting: Pulmonary Disease

## 2018-04-21 ENCOUNTER — Telehealth (INDEPENDENT_AMBULATORY_CARE_PROVIDER_SITE_OTHER): Payer: Self-pay | Admitting: Family Medicine

## 2018-04-21 ENCOUNTER — Encounter: Payer: Self-pay | Admitting: Pulmonary Disease

## 2018-04-21 NOTE — Progress Notes (Signed)
Subjective:    Patient ID: Hannah Mercado, female    DOB: 18-Jan-1923, 85 y.o.   MRN: [id number]  HPI 85 y/o WF here for a follow up visit... ~  SEE PREV EPIC NOTES FOR OLDER DATA >>     2DEcho 2/04 showing MVP, mild MR, normal LVF... StressEcho w/ EF=60%... Cardiolite 12/03 was normal w/o ischemia and EF=65%.  Baseline EKG 02/08/11>  NSR, rate60, 1st degree AVB w/ PR=.22  CXR 11/13 showed borderline cardiomeg, ectasia of the Ao, chronic lung changes/ NAD, DJD/ spondylosis...  LABS 11/13:  Chems- wnl;  CBC- wnl;  UA- ok...   LABS 5/14:  FLP- at goals on Lip20;  Chems- wnl;  CBC- wnl;  TSH=2.61;  VitD=56;  UA- few wbc, no bact...  CXR => done 6/15>  Borderline cardiomeg, prominent PAs, calcif Ao arch, COPD w/ sl incr markings/ NAD, DJD in Tspine...  LABS 5/15:  FLP- within parameters on Lip20;  Chems- wnl;  CBC- ok w/ Hg=12.6;  TSH=2.41;  VitD=55 on 1000u daily...  BMD => done 6/15>  Lowest Tscore is -2.6 in Lumbar spine; she is rec to continue ALENDRONATE 70mg /wk & calcium/ MVI/ VitD supplements...  CXR 6/16 showed borderline heart size, hyperinflated lungs, icr markings bilat, NAD/ no change...  LABS 6/16:  FLP- at goals on Atorva20;  Chems- wnl;  CBC- wnl;  TSH=3.86;  UA- clear... NOTE> CEA done 2/16 by DrByerly & we do not have results.  CDopplers 6/16 showed irreg mixed plaque bilat, 40-59% RICAstenosis & 0-97% LICAstenosis, norm subclav & antegrade vertebrals..The current medical regimen is effective;  continue present plan and medications. ASA daily. ~  03/12/15:  49mo ROV & Hannah Mercado turned 89 this past summer- surprise party by her 2 children w/ 40 people & she had a blast; she is going to family on [place] for 52mo over the holidays; clinically doing very well- feels good, still does crosswords daily, no new complaints or concerns... She is reminded to drink 1-2 can Ensure/ Sustacal/ Boost daily to help her gain wt...    ~  September 11, 2015:  62mo ROV & pulmonary/ medical follow  up visit>  Hannah Mercado reports a good 17mo interval but notes LE edema x 3-4wks;  She has gained 9# from her baseline 23mo ago (123#) to 132# today assoc w/ 2+pitting edema in both legs which is new; she denies dietary sodium excess or prev hx of edema=> see below as we review the following medical problems during today's office visit >>     Hx AB, pulm fibrosis> on ProventilHFA prn (rarely uses); no resp symptoms & breathing good, & she denies cough, phlegm, SOB, etc; CXR today shows cardiomeg & interstitial edema...    MVP, HBP> on ASA81 & Amlod2.5; BP= 138/70 but pulse is slow today w/ HR ~40 & she denies CP, palpit, dizzy/ syncope/ near-syncope, change in DOE etc...    Cardiac arrhythmia> new finding on today's EKG=> ?Flutter w/ slow VR, rate40; she has the new edema, interstitial changes on CXR & BNP=857 => referred to [facility] ASAP for eval...    Right carotid bruit> no cerebral ischemic symptoms; CDoppler 6/17 shows no change in 40-59% RICA stenosis, Rx ASA...    Chol> remains on Lip20 & FLP 6/17 looks great w/ TChol 148, TG 75, HDL 72, LDL 61    HH, GERD> on Prilosec20mg /d + antireflux regimen in view of her HH etc; she denies abd pain, n/v, notes intermit diarrhea, no blood seen...    Hx rectal  cancer> s/p lap low anterior resection 11/12 by DrByerly; f/u colonoscopy 11/13 by DrGessner- mod divertics, anastomosis ok; she sees DrByerly Q70mo- notes reviewed still w/ occas loose stool (uses Immodium prn); on Align & Activia; she's avoiding lactose & gas is improved, improved after nutrition consult;  CEA level 2/15 = 3.2 (stable); CEA 2/16 & 2/17 by DrByerly & we don't have report.    DJD, Osteopenia> on Fosamax70/wk along w/ Ca, MVI, VitD; last BMD 6/15 w/ lowest Tscore-2.6 (continue same rx), she is getting along well but hasn't played much golf since her surg...    Other problems as noted... She likes to take a number of supplements... EXAM shows Afeb, VSS x pulse=40; O2sat=96% on RA; Wt=132#, BMI=21;   HEENT- neg, mallampati1, R carotid bruit;  Chest- decr BS w/ few scat rhonchi, bibasilar rales- unchanged;  Heart- brady rate ~40, gr 1-2/6 SEM w/o r/g;  Abd- thin soft, neg;  Ext- VI, 2+edema in LEs w/o c/c;  Neuro- intact...  CDoppler 09/04/15>  Heterogeneous plaque bilat, stable 40-595 RICA stenosis & 1-19% LICA stenosis, subclav & vertebrals ok...  CXR 09/11/15>  Mod cardiomegaly, incr interstitial markings suggesting poss edema, increased markings at bases...  EKG 09/11/15>  Bradycardia, rate ~40, ?AFlutter, poor R progression V1-2...  LABS 09/11/15>  FLP- all parameters at goals on Lip20;  Chems- wnl w/ Cr=0.94, BS=112, BNP=857;  CBC- wnl;  TSH=4.80;  VitD=60;  UA- clear...  IMP/PLAN>>  While still essentially asymptomatic at age 37, Hannah Mercado has developed 2+pitting edema in LEs over the last 3weeks or so & has a bradycardia on EKG w/ ?Flutter & slow VR, only on Amlod2.5, BNP is elev at 857=> we will start Lasix20 & refer for Cards eval ASAP.    ~  February 24, 2016:  6 month [place] had Pacer placed in July- [place] for [facility]> most recent f/u 01/2016-- hx AFib/Flutter, low HR from CHB=> pacer placed 10/16/15, doing satis, planning trip to [place] for [place]... On Eliquis, no further AFib on device check... We reviewed the following medical problems during today's office visit >>     Hx AB, pulm fibrosis> on ProventilHFA prn (rarely uses); no resp symptoms & breathing good, & she denies cough, phlegm, SOB, etc; CXR today shows cardiomeg & interstitial edema...    MVP, HBP> on Amlod2.5 & Lasix20; BP= 124/70 but pulse is slow today w/ HR ~40 & she denies CP, palpit, dizzy/ syncope/ near-syncope, change in DOE etc...    Cardiac arrhythmia=> Fib/Flutter & CHB=>Pacer> new finding 08/2015=> ?Flutter w/ slow VR, rate40; she has the new edema, interstitial changes on CXR & BNP=857 => referred to [employer]    Right carotid bruit> no cerebral ischemic symptoms; CDoppler 6/17 shows no change in  40-59% RICA stenosis, Rx ASA...    Chol> remains on Lip20 & FLP 6/17 looks great w/ TChol 148, TG 75, HDL 72, LDL 61    HH, GERD> on Prilosec20mg /d + antireflux regimen in view of her HH etc; she denies abd pain, n/v, notes intermit diarrhea, no blood seen...    Hx rectal cancer> s/p lap low anterior resection 11/12 by DrByerly; f/u colonoscopy 11/13 by DrGessner- mod divertics, anastomosis ok; she sees DrByerly Q13mo- notes reviewed still w/ occas loose stool (uses Immodium prn); on Align & Activia; she's avoiding lactose & gas is improved, improved after nutrition consult;  CEA level 2/15 = 3.2 (stable); CEA 2/16 & 2/17 by DrByerly & we don't have report.  DJD, Osteopenia> on Fosamax70/wk along w/ Ca, MVI, VitD; last BMD 6/15 w/ lowest Tscore-2.6 (continue same rx), she is getting along well but hasn't played much golf since her surg...    Other problems as noted... She likes to take a number of supplements... EXAM shows Afeb, VSS; O2sat=97% on RA; Wt=122#, BMI=19;  HEENT- neg, mallampati1, R carotid bruit;  Chest- decr BS w/ few scat rhonchi, bibasilar rales- unchanged;  Heart- RR rate70, gr 1-2/6 SEM w/o r/g;  Abd- thin soft, neg;  Ext- VI, +tr edema in LEs w/o c/c;  Neuro- intact... IMP/PLAN>>  Hannah Mercado is stable & looking forward to Xmas in [place] w/ family; continue same meds and follow up w/ [place] & [facility]...  ~  August 24, 2016:  69mo ROV & Amerah reports a good interval- she notes sl wobbly on occas & wonders about her Lasix but BP is good & edema resolved;  She reports that she's going to [employer] in [place] for [place]mo soon... we reviewed the following medical problems during today's office visit >>     Hx AB, pulm fibrosis> on ProventilHFA prn (rarely uses); no resp symptoms & breathing good, & she denies cough, phlegm, SOB, etc; CXR today shows cardiomeg & interstitial edema...    MVP, HBP> on Amlod5 & Lasix20; BP= 124/70 but pulse is slow today w/ HR ~40 & she denies CP, palpit, dizzy/ syncope/ near-syncope,  change in DOE etc...    Cardiac arrhythmia=> Fib/Flutter & CHB=>Pacer> new finding 08/2015=> hx AFib/Flutter, low HR from CHB=> pacer placed 10/16/15, on Eliquis2.5Bid, no further AFib on device checks...    Right carotid bruit> on ASA81; no cerebral ischemic symptoms; CDoppler 6/17 shows no change in 40-59% RICA stenosis, but f/u CDopplers 6/18 showed bilat 1-39% ICAstenoses, continue same...    Chol> remains on Lip20 & FLP 6/18 looks great w/ TChol 183, TG 101, HDL 83, LDL 80... continue same.    HH, GERD> on Prilosec20mg /d + antireflux regimen in view of her [place] etc; she denies abd pain, n/v, notes intermit diarrhea, no blood seen...    Hx rectal cancer> s/p lap low anterior resection 11/12 by DrByerly; f/u colonoscopy 11/13 by DrGessner- mod divertics, anastomosis ok; she sees DrByerly Q55mo- notes reviewed still w/ occas loose stool (uses Immodium prn); on Align & Activia; she's avoiding lactose & gas is improved, improved after nutrition consult;  CEA levels have been in the 3's but f/u 08/24/16= 4.6 & we will recheck in 31mo.    Mild renal insuffic>  Prev renal function wnl (0.94-1.08) and labs 6/18 showed Cr=1.42 on Lasix20/d... OK to change to "prn edema" & reminded to incr water intake.    DJD, Osteopenia> on Fosamax70/wk along w/ Ca, MVI, VitD; BMD recheck 6/18 showed lowest Tscore-2.5 & she is in need of a drug holiday starting now- she is getting along well but hasn't played much golf since her surg...    Other problems as noted... She likes to take a number of supplements... EXAM shows Afeb, VSS; O2sat=98% on RA; Wt=124#, BMI=19;  HEENT- neg, mallampati1, R carotid bruit;  Chest- decr BS w/ few scat rhonchi, bibasilar rales- unchanged;  Heart- RR rate70, gr 1-2/6 SEM w/o r/g;  Abd- thin soft, neg;  Ext- VI, +tr edema in LEs w/o c/c;  Neuro- intact...  CXR 08/24/16>  Cardiomeg, pacer, overinflation/ COPD, bibasilar scarring, NAD...   LABS 08/24/16>  FLP- at goals on Atorva20;  Chems- ok x BS=117,  Cr=1.42;  CBC- wnl w/ Hg=13.5;  Stool  neg;  TSH=2.61;  CEA=4.6 (up from 3.2);  VitD=55;  UA shows UTI w/ +leukocytes/ nitrite pos/ +bact=> Rx w/ CIPRO  Bone Density 09/01/16>  Lowest Tscore is -2.5 in Lumbar spine, and -2.4 in RFN... She has been on Bisphos Rx for yrs & in need of a drug holiday-- Rec to stop after current supply gone & continue calcium, VitD, wt bearing exercise; we will plan f/u BMD at the 76yr interval...   CDopplers 09/09/16 showed heterogen plaque, bilat 1-39% ICA stenoses, >50% RECA stenosis, normal subclav &vertebrals... Stable to improved on ASA81- continue same... IMP/PLAN>>  OK for Hannah Mercado to try off the Lasix20/d to "prn" for edema;  UTI treated w/ Cipro250Bid x 7d;  BMD is sl improved & she is in need of a bisphos drug holiday- therefore finish to current supply & then stop the Fosamax but continue dietary calcium, vit D supplement and wt bearing exercise;  DrByerly wanted her to have stool cards=> neg for blood, but f/u CEA is 4.6 => rec to recheck in 57mo...  ~  February 23, 2017:  76mo ROV & Hannah Mercado indicates that "I've got a little hitch in my get-a-long" meaning that she has some right hip pain but notes that tylenol & "rapid release gel" really help & she is not inclined toward further eval- asked to watch this closely & let me know if she needs further eval w/ XRays, Ortho, etc...  She had a great Thanksgiving in [place] w/ family;  Her recommendations for doing well at 89=> eat right, exercise daily, don't watch TV all day long!  She lives alone in her own townhouse & cares for it by herself, including her 5 christmas trees (recent article in [id number])...    She saw [facility]/EP- DrCamnitz on 11/15/16>  Hx HBP, AFib/Flutter, pacer placed7/2017 for CHB, HL-- doing satis in AFib, on Amlod5, Eliquis2.5Bid, pacer functioning normally, Lasix20 prn, no changes made...  We reviewed the following medical problems during today's office visit>      AB, postinflamm pulm fibrosis> has  AlbutHFA for prn use, CXR w/ cardiomeg & bibasilar scarring, stable...    Cards- HBP, AFib/Flutter, Hx CHB w/ pacer; followed by DrCamnitz as above...    Hx R-CBruit> on ASA81, CDopplers w/ 40-59% RICA stenosis but f/u CDopplers 6/18 showed bilat 1-39% ICA stenoses and >50% RECA stenosis...    HL> on Lip20 taking 1/2 tab daily w/ good FLP 6/18...    Underweight> wt is down 2# to 122# today & BMI=15, we discussed nutrittional supplements...    Hx rectal cancer- s/p low ant resection 11/12 by DrByerly; slowly rising CEA level=> 3's thru 2015, 4.6 in Jun2018, 6.2 in Dec2018=> CT Abd&Pelvis ordered (see below)    Mild renal insuffic?> Cr= 1.42 in Jun2018 on Lasix20 for edema, and now 0.91 on Lasix prn...    DJD/ osteoporosis> on calcium, MVI, VitD and Fosamax70/wk; last BMD 6/18 showed Tscore -2.5 & we started a drug holiday at that time...  EXAM shows Afeb, VSS; O2sat=97% on RA; Wt=122#, BMI=19;  HEENT- neg, mallampati1, R carotid bruit;  Chest- decr BS w/ few scat rhonchi, bibasilar rales- unchanged;  Heart- RR rate70, gr 1-2/6 SEM w/o r/g;  Abd- thin soft, neg;  Ext- VI, +tr edema in LEs w/o c/c;  Neuro- intact...  LABS 02/23/18>  Chems- ok w/ Cr=0.91;  CEA- 6.2  CT Abd&Pelvis 03/08/17 >> Cardiomeg w/ coronary & aortic calcif; sigmoid diverticulosis, low rectal anastomotic staple line w/o evid of recurrent tumor, small retroperitoneal  LNs (not pathologically enlarged), bilat renal cysts & bladder diverticulum, aorto-iliac atherosclerotic vasc dis & narrowing of the prox celiac trunk & SMA (renal arts are ok)... No evid for recurrent colorectal cancer... IMP/PLAN>>  Hannah Mercado is stable clinically but the slowly rising CEA is a concern=> NEG CT Abd&Pelvis is reassuring but we will continue to follow;  She is 85 y/o but fiercely independent & actually doing quite well...    ~  August 31, 2017:  46mo ROV & Hannah Mercado reports that her breathing is good, she denies cough/ sput/ SOB; she denies CP/ palpit (she is in chr  AFib)/ edema; her CC is that her legs sting & burn c/w neuropathy and we discussed a trial of Gabapentin for this... No interval Epic medical notes over the last 6 months...  We reviewed the following medical problems during today's office visit>      AB, postinflamm pulm fibrosis> has AlbutHFA for prn use, CXR w/ cardiomeg & bibasilar scarring, stable...    PulmHTN> 2DEcho 08/2015 w/ PAsys= 72mmHg; 2DEcho 08/2017 w/ PAsys= 57mmHg; etiology is multifactorial...    Cards- HBP, AFib/Flutter, Hx CHB w/ pacer; followed by DrCamnitz as above...    Hx R-CBruit> on ASA81, CDopplers w/ 40-59% RICA stenosis but f/u CDopplers 6/18 showed bilat 1-39% ICA stenoses and >50% RECA stenosis...    HL> on Lip20 taking 1/2 tab daily w/ good FLP 6/18 & 6/19 (TChol 176, TG 81, HDL 85, LDL 76)...    Underweight> wt is stable at 122# today & BMI=15, we discussed nutritional supplements...    Hx rectal cancer- s/p low ant resection 11/12 by DrByerly; slowly rising CEA level=> 3's thru 2015, 4.6 in Jun2018, 6.2 in Dec2018=> CT Abd&Pelvis 02/2017 w/o evid of recurrent cancer...    Mild renal insuffic?> Cr= 1.42 in Jun2018 on Lasix20 for edema, and now improved to 1.05 on Lasix prn...    DJD/ osteoporosis/ compression fxs> on calcium, MVI, VitD and Fosamax70/wk; last BMD 6/18 showed Tscore -2.5 (sl improved) & we started a drug holiday at that time; NOTE: CT Abd 02/2017 also showed new interval 70% vertebral body compression fracture at L4 with vertebral sclerosis and 10 mm of posterior bony rim retropulsion contributing to mild impingement at the L4-5 level, & there is also a chronic 30% superior endplate compression fracture at L2. EXAM shows Afeb, VSS; O2sat=97% on RA; Wt=122#, BMI=19;  HEENT- neg, mallampati1, R carotid bruit;  Chest- decr BS w/ few scat rhonchi, bibasilar rales- unchanged;  Heart- RR rate70, gr 1-2/6 SEM w/o r/g;  Abd- thin soft, neg;  Ext- VI, +tr edema in LEs w/o c/c;  Neuro- c/o neuropathy symptoms in legs  but not c/o any signif LBP...  Ambulatory oximetry 08/31/17>  O2sat=100% on RA at rest w/ pulse=88/min;  She ambulated 3 laps in office (185'each) w/ lowest O2sat=97% w/ pulse=79/min  LABS 08/31/17>  FLP- all parameters at goals on Atorv10;  Chems- ok x K=5.6, BS=116, Cr=1.05, LFTs wnl;  CBC- wnl w/ Hg=14, WBC=6.6;  TSH=3.87;  Pro-BNP=532 (norm=<100);  CEA=5.7 (down from 6.2 prev)...   2DEcho 09/02/17>  Pt in AFib, norm LVF w/ EF=55-60%, norm wall motion, triv AI/ no stenosis, mild bileaflet MVprolapse w/ triv MR, severe LA dil (45mm), norm RV, RA severely dilated, PAsys=66 mmHg  ONO on RA 08/2017>  Reported to show O2 desat to 77% & we set her up w/ Home O2 at 2L/min Qhs for the nocturnal hypoxemia & her pulmHTN...  IMP/PLAN>>  Pascha remains very stoic- only c/o  some LE neuropathic symptoms and denies resp problems, SOB, etc;  We have prescribed Gabapentin 100mg => 300mg  Qhs and she is going to start on O2 at 2L/min Qhs; labs are ok & her CEA is NOT progressive; concern for her osteoporosis & compression fxs + spondylosis changes...   ~  February 22, 2018:  6 month ROV & add-on appt requested for severe back pain>  Hannah Mercado reports that about 2 months ago she lifted the corner of a heavy sofa & over then next few days she developed LBP and treated self w/ heat/ OTC analgesics/ etc & improved somewhat; then she tried to apply a fitted sheet to a bed & the pain increased; she denies falling or any known trauma; the pain got steadily more severe & she went to [facility] 02/06/18 & was Dx w/ lumbar strain & given Cyclobenzaprine 5mg - 1/2 tab Qhs...  XRay of Lumbar Spine 02/06/18 at [facility] showed compression fxs of L2 (~50%), L3 (~30%), L4 (~50%) w/ multilevel spondylosis...    She called our office with c/o back discomfort but did not provide additional data ("slight pulled muscle & back sprain") & we called in Trasmadol 50mg  one tab Tid prn pain;  She finally presented to our office 02/17/18 & was  seen by TNichols,NP and given Depomedrol injection and asked to f/u w/ me today... NOTE: she is 85 y/o, known osteoporosis & prev Fosamax rx x yrs (placed on drug holiday 08/2016 when f/u BMD showed some improvement); no prev back XRays however CT Abd done 03/07/17 (no sign of recurrent rectal cancer) showed incidental finding of L4 compression (~70% w/ sclerosis & 83mm post body retropulsion) and L2 superior endplate compression (~47%), also lumbar spondylosis & DDD w/ bilat foraminal impingement L4-5;  No other lumbar spine images avail x those seen on BMD scans from 08/2013 & 08/2016 (both did not appear to show L4 compression)...     EXAM shows Afeb, VSS; O2sat=98% on RA; Wt=104# (down 16# in 74mo), BMI=16;  HEENT- neg, mallampati1, R carotid bruit;  Chest- decr BS w/ few scat rhonchi, bibasilar rales- unchanged;  Heart- RR rate70, gr 1-2/6 SEM w/o r/g;  Abd- thin soft, neg;  Ext- VI, +tr edema in LEs w/o c/c;  Neuro- back is min tender to palp, pain w/ movement, & could not perform SLR. IMP/PLAN>>  Hannah Mercado has severe subacute LBP w/ mult potential etiologies- 3 compression fxs, malalignment, spondylosis & foraminal impingement may all be playing a roll; she will need urgent Ortho vs NS eval w/ MRI and our staff was able to arrange for consult w/ [facility] tomorrow (DrHilts) at 10:20AM;  NOTE: she will need to re-start osteoporosis meds & with her 3 ncompression fxs I would rec FORTEO injections (we will proceed w/ prior auth for this med)...    ~  March 02, 2018:  10d ROV & medical follow up visit>  Hannah Mercado is here w/ her [place] from [place] & will be traveling up [place] for the holidays;  After her last visit for severe back pain w/ mult potential etiologies- 3 compression fxs, malalignment, spondylosis & foraminal impingement may all be playing a roll;  She saw DrHilts on 02/22/18>  LBP after lifting corner of sofa 38mo ago, XRays showed L2, L3, L4 compression fxs (L2 & L4 old but L3 was new); unable to do  MRI w/ pacer; she was getting good relief w/ Tramadol + Tylenol Tid & they decided to withhold vertebroplasty unless pain got worse; avoid lifting >  10 lbs, bending, stooping; rec to walk for exercise & to decr fall risk... REC to continue on the Tramadol+Tylenol regimen, plus brace for back, rest, heat, etc... Due to her underlying problems, osteoporosis=> we need to consider FORTEO injections x 1-2 yrs... we will commence the procedures to get coverage for this important medication...    We discussed my up-coming retirement & we will arrange for Primary Care through the [facility] office...            Problem List:   pt's cell # [phone]  ALLERGY (ICD-995.3) - uses OTC meds as needed.  ASTHMATIC BRONCHITIS, ACUTE (ICD-466.0) - uses PROAIR as needed, but doing well, breathing OK. ~  7/09:  AB exac Rx w/ Pred, Avelox, Mucinex, etc... symptoms resolved. ~  CXR: baseline film w/ mild cardiomeg, scarring, chr changes, HH seen, & NAD.Marland Kitchen. ~  f/u CXR 6/11 & 7/12>  no change... ~  CXR 11/13 showed borderline cardiomeg, ectasia of the Ao, chronic lung changes/ NAD, DJD/ spondylosis..  ~  CXR 6/15>  Borderline cardiomeg, prominent PAs, calcif Ao arch, COPD w/ sl incr markings/ NAD, DJD in Tspine ~  CXR 6/16 showed borderline heart size, hyperinflated lungs, icr markings bilat, NAD/ no change ~  CXR 6/17 showed mod cardiomegaly, incr interstitial markings suggesting poss edema, increased markings at bases... ~  CXR 6/18 showed cardiomeg, pacer, overinflation/ COPD, bibasilar scarring, NAD...   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 exam showed bibasilar velcro rales at bases...  HYPERTENSION >> on NORVASC 5mg - 1/2 tab daily... ~  1/13: BP= 160/80 & we decided to start Norvasc5mg /d... subeq cut down to 1/2 tab per phone message. ~  3/13: BP= 154/84 & repeat 124/74; she feels well & denies CP, palpit, SOB, edema, etc;  continue same... ~  5/13:  BP= 150/68 & she remains asymptomatic &in good spirits; ok to continue same for now, monitor BP at home. ~  11/13:  BP= 122/70 & she continues to gain strength slowly... ~  11/14: on Amlod5-1/2 daily; BP= 116.62 & she denies CP, palpit, SOB, edema... ~  5/15: on ASA81 & Amlod2.5; BP= 124/70 & similar at home; stable w/ msc & gr1/6 SEM; no CP, palpit, ch in SOB, edema, etc ~  12/15: on ASA81 & Amlod2.5; BP= 122/62 and stable, doing satis... ~  6/16: on ASA81 & Amlod2.5; BP= 136/80 & similar at home; stable w/ msc & gr1/6 SEM; no CP, palpit, ch in SOB, edema, etc ~  6/17: on ASA81 & Amlod2.5; BP= 138/70 but pulse is slow today w/ HR ~40 & she denies CP, palpit, dizzy/ syncope/ near-syncope, change in DOE etc... ~  6/18 on ASA81 & Amlod5 & Lasix20;  BP= 132/74 & she denies CP, palpit, SOB, edema...  MITRAL VALVE PROLAPSE >> Atrial Fib/ Flutter Complete heart block => PACER 09/2015 ~  had 2DEcho 2/04 showing MVP, mild MR, normal LVF... StressEcho w/ EF=60%... subseq Cardiolite 12/03 was normal w/o ischemia and EF=65%... denies CP, palpit, dizzy, syncope, edema, etc; she is gradually increasing activities post op... ~  EKG 11/12 showed NSR, rate60, sl incr voltage, NAD... ~  6/17> Cardiac arrhythmia> new finding on today's EKG=> ?Flutter w/ slow VR, rate40; she has the new edema, interstitial changes on CXR & BNP=857 => referred to [facility] ASAP for eval... ~  09/2015> Pacer placed by [facility] for CHB; subseq device checks w/o recurrent AFib, pt on  Eliquis... ~  08/2016> on Eliquis2.5Bid & stable...  Right Carotid Bruit>  no cerebral ischemic symptoms; CDoppler 6/16 showed 40-59% RICA stenosis, Rx ASA. ~  no cerebral ischemic symptoms; CDoppler 6/17 shows no change in 40-59% RICA stenosis, Rx ASA... ~  CDoppler 08/2016>  heterogen plaque, bilat 1-39% ICA stenoses, >50% RECA stenosis, normal subclav &vertebrals... Stable to improved on ASA81- continue  same...  HYPERCHOLESTEROLEMIA (ICD-272.0) - controlled on LIPITOR 20mg /d,  and tol well...  ~  labs 7/08 showed TChol 183, TG 111, HDL 65, LDL 96... Doing well, continue Lip20. ~  FLP 11/09 showed TChol 189, TG 70, HDL 71, LDL 104 ~  FLP 5/10 showed TChol 173, TG 53, HDL 75, LDL 87... Remains stable on Lip20. ~  FLP 6/11 showed TChol 183, TG 103, HDL 74, LDL 88 ~  FLP 7/12 showed TChol 182, TG 127, HDL 74, LDL 82... Continues stable on Lip20. ~  FLP 5/13 on Lip20 showed TChol 167, TG 79, HDL 82, LDL 69... Continue same. ~  FLP 5/14 on Lip20 showed TChol 174, TG 64, HDL 78, LDL 83 ~  FLP 5/15 on Lip20 showed TChol 184, TG 84, HDL 83, LDL 84 ~  FLP 6/16 on Lip20 showed TChol 187, TG 83, HDL 93, LDL 78 ~  FLP 6/17 on Lip20 showed TChol 148, TG 75, HDL 72, LDL 61 ~  FLP 6/18 on Lip20 showed TChol 183, TG 101, HDL 83, LDL 80   HIATAL HERNIA (ICD-553.3) & GERD (ICD-530.81) - she has a HH seen on CXR and CT Chest... takes OMEPRAZOLE 20mg /d and denies heartburn, GERD, regurg, dysphagia, etc... pt never had EGD or Colonoscopy> she had repeatedly refused to sched a colonoscopy! ~  Q85mo ROVs>  she continues to deny reflux symptoms, abd pain, N/V, etc>  ADENOCARCINOMA of the RECTUM >> Dx 9/12 via colonoscopy for hematochezia & 11/12 underwent laparoscopic assisted low anterior resection by DrByerly [facility] & left ovarian cystectomy (benign) by Samuel Jester; final path = T2 N0 mod diff adenocarcinoma invading into but not through the muscularis, no lymphatic invasion, 12 neg LNs, clear margins... ~  6/13:  She continues to f/u w/ DrByerly; c/o some loose stools, otherw ok... ~  11/13: she had f/u appt w/ DrGessner- Colonoscopy 11/13 showed severe divertics on left, mod on right, clean anastomosis... ~  She continues to f/u w/ DrByerly, [facility] (last visit 2/16)> doing satis, just occas bouts of diarrhea & uses Immodium as needed... They did CEA level but we don't have report. ~  She continues to f/u w/ DrByerly yearly-  we checked stool cards 6/18- NEG; we did CEA 6/18 => 4.6 & we will recheck in 74mo  RECURRENT UTIs>  Routine urinalysis w/ WBC, Bact, Leukocyte+, etc... Treated w/ Cipro as required.  DEGENERATIVE JOINT DISEASE (ICD-715.90) - mild in fingers, no other complaints x "I can't make quick turns anymore"... she notes the Tonic Water Qhs works great for her nightime cramps, & on NEURONTIN 100mg - 2Qhs for ?neuropathy symptoms?  OSTEOPENIA (ICD-733.90) - BMDs here in 2003, 2005, 2007- showed osteopenia/ osteoporosis w/ TScores -1.9 to -2.5.Marland KitchenMarland Kitchen on FOSAMAX, Ca++, Vits... ~  labs 11/09 showed Vit D level = 28... rec to take OTC supplement 1000 u daily. ~  BMD here 7/11 showed TScores -2.3 in Lumbar Spine, and -2.3 in right FemNeck... continue Rx. ~  Labs 5/13 showed Vit D level = 54... rec to continue her 1000u supplement... ~  BMD 6/15>  Lowest Tscore is -2.6 in Lumbar spine; she  is rec to continue ALENDRONATE 70mg /wk & calcium/ MVI/ VitD supplements. ~  BMD 6/18>  Lowest Tscore is -2.5 in Lumbar spine, and -2.4 in RFN;  She is in need of bisphos drug holday=> stop now for the next 45yr cycle & recheck BMD in June 2020 ~  CT Abd 02/2017 showed new interval 70% vertebral body compression fracture at L4 with vertebral sclerosis and 10 mm of posterior bony rim retropulsion contributing to mild impingement at the L4-5 level, & there is also a chronic 30% superior endplate compression fracture at L2.  ANXIETY (ICD-300.00)  Health Maintenance - GYN= DrHenley & follow up [place] w/ neg exam she says and stool cards were neg by her report... [facility] 6[facility]10 [facility] was neg... she refuses to consider a colonoscopy... Flu vaccine given each fall... had PNEUMOVAX 2009 & PREVNAR-13 given [id number]... OK TDAP 6/11...   Past Surgical History:  Procedure Laterality Date  . APPENDECTOMY    . COLON RESECTION  02/08/2011   Procedure: LAPAROSCOPIC SIGMOID COLON RESECTION;  Surgeon: Stark Klein, MD;  Location: [facility];   Service: General;  Laterality: N/A;  Laparoscopic Lower Anterior Bowel Resection  . COLON SURGERY    . COLONOSCOPY  11/27/10   rectal cancer, small sigmoid adenoma, diverticulosis  . ECTOPIC PREGNANCY SURGERY     Midline incision  . EP IMPLANTABLE DEVICE N/A 10/16/2015   Procedure: Pacemaker Implant;  Surgeon: Will Meredith Leeds, MD;  Location: [place] [facility];  Service: Cardiovascular;  Laterality: N/A;  . HAMMER TOE SURGERY  1995   Dr. Silverio Decamp  . Laparoscopic Left oophorectomy, laparoscopic low anterior resection, placement of OnQ pain pump  02/08/2011  . NASAL SINUS SURGERY  1994  . OVARIAN CYST REMOVAL  02/08/2011   Procedure: OVARIAN CYSTECTOMY;  Surgeon: Stark Klein, MD;  Location: [facility];  Service: General;  Laterality: Left;  Removal of Left Ovary and Pelvic Mass  . VAGINAL HYSTERECTOMY  1980's    Outpatient Encounter Medications as of 03/02/2018  Medication Sig  . albuterol (PROVENTIL HFA;VENTOLIN HFA) 108 (90 BASE) MCG/ACT inhaler Inhale 2 puffs into the lungs every 6 (six) hours as needed. Wheezing   . amLODipine (NORVASC) 5 MG tablet TAKE 1 TABLET BY MOUTH EVERY DAY  . ascorbic acid (VITAMIN C) 250 MG CHEW Chew 250 mg by mouth daily.   Marland Kitchen atorvastatin (LIPITOR) 20 MG tablet Take 0.5 tablets (10 mg total) by mouth daily.  . Calcium Carbonate-Vitamin D (CALCIUM-VITAMIN D) 500-200 MG-UNIT per tablet Take 2 tablets by mouth daily.   . Cholecalciferol (VITAMIN D3) 1000 UNITS CAPS Take 1 capsule by mouth daily.   . cyclobenzaprine (FLEXERIL) 5 MG tablet Take 2.5 mg by mouth at bedtime.  Marland Kitchen ELIQUIS 2.5 MG TABS tablet TAKE 1 TABLET BY MOUTH TWICE A DAY  . ferrous sulfate 325 (65 FE) MG tablet Take 325 mg by mouth daily with breakfast.  . furosemide (LASIX) 20 MG tablet Take 1 tablet (20 mg total) by mouth as needed (swelling).  . gabapentin (NEURONTIN) 100 MG capsule Take 1-3 tabs as directed at bedtime  . loperamide (LOPERAMIDE A-D) 2 MG tablet Take 2 mg by mouth every 6  (six) hours as needed.  Marland Kitchen omeprazole (PRILOSEC) 20 MG capsule Take 20 mg by mouth daily.   . traMADol (ULTRAM) 50 MG tablet Take 1 tablet (50 mg total) by mouth 3 (three) times daily as needed for moderate pain.  . vitamin B-12 (CYANOCOBALAMIN) 250 MCG tablet Take 250 mcg by mouth daily.   Marland Kitchen  vitamin E (VITAMIN E) 200 UNIT capsule Take 200 Units by mouth daily.   . [DISCONTINUED] traMADol (ULTRAM) 50 MG tablet Take 1 tablet (50 mg total) by mouth 3 (three) times daily as needed for moderate pain.   Facility-Administered Encounter Medications as of 03/02/2018  Medication  . ondansetron (ZOFRAN) injection    Allergies  Allergen Reactions  . Sulfonamide Derivatives Nausea And Vomiting    Happened a long time ago, does not remember the other reaction she had.    Immunization History  Administered Date(s) Administered  . Influenza Split 01/02/2011, 01/03/2012, 12/20/2012  . Influenza Whole 12/20/2009  . Influenza, High Dose Seasonal PF 01/24/2014, 01/01/2015, 05/24/2016, 01/22/2018  . Influenza,inj,Quad PF,6+ Mos 01/21/2014, 12/21/2014  . Pneumococcal Conjugate-13 02/25/2014  . Pneumococcal Polysaccharide-23 03/22/2005  . Tdap 03/18/2018    Current Medications, Allergies, Past Medical History, Past Surgical History, Family History, and Social History were reviewed in [employer] record.   Review of Systems         See HPI - all other systems neg except as noted... +wt loss, poor appetite, +weak, LBP, difficulty walking The patient denies anorexia, fever, weight gain, vision loss, decreased hearing, hoarseness, chest pain, syncope, dyspnea on exertion, peripheral edema, prolonged cough, headaches, hemoptysis, abdominal pain, melena, hematochezia, severe indigestion/heartburn, hematuria, incontinence, suspicious skin lesions, transient blindness, depression, abnormal bleeding, enlarged lymph nodes, and angioedema.   Objective:   Physical Exam   Thin, weak, 85  y/o WF in mild distress from LBP => she has lost weight down 16# to 104# today w/ BMI=16 GENERAL:  Alert & oriented; pleasant & cooperative... HEENT:  [place]/AT, Glasses, EOM-wnl, PERRLA, EACs-clear, TMs-wnl, NOSE-clear, THROAT-clear & wnl. NECK:  Supple w/ fair ROM; no JVD; normal carotid impulses w/ faint right Cbruit; no thyromegaly or nodules palpated; no lymphadenopathy. CHEST:  few bibasilar dry rales about 1/3rd way up, no wheezing, no rhonchi, no cough, etc... HEART:  RR rate 60; Gr1/6 SEM without rubs or gallops, no msc heard... ABDOMEN:  Surg scar healed, nontender to palp, normal bowel sounds; no organomegaly or masses detected. EXT:  no deformities, mild arthritic changes; no varicose veins/ +venous insuffic/ no edema at present... NEURO:  CN's intact; no focal neuro deficits... Back is sl tender to palp, unable to lie down & attempt SLR. DERM:  No lesions noted; no rash etc...  RADIOLOGY DATA:  Reviewed in the EPIC EMR & discussed w/ the patient...  LABORATORY DATA:  Reviewed in the EPIC EMR & discussed w/ the patient...   Assessment & Plan:    02/23/17>  Hannah Mercado is stable clinically but the slowly rising CEA is a concern=> NEG CT Abd&Pelvis is reassuring but we will continue to follow;  She is 85 y/o but fiercely independent & actually doing quite well.. 08/31/17>   Hannah Mercado remains very stoic- only c/o some LE neuropathic symptoms and denies resp problems, SOB, etc;  We have prescribed Gabapentin 100mg => 300mg  Qhs and she is going to start on O2 at 2L/min Qhs; labs are ok & her CEA is NOT progressive; concern for her osteoporosis & compression fxs + spondylosis changes 02/22/18>   Hannah Mercado has severe subacute LBP w/ mult potential etiologies- 3 compression fxs, malalignment, spondylosis & foraminal impingement may all be playing a roll; she will need urgent Ortho vs NS eval w/ MRI and our staff was able to arrange for consult w/ [facility] tomorrow (DrHilts) at 10:20AM;  NOTE: she will  need to re-start osteoporosis meds & with her  3 compression fxs I would rec FORTEO injections (we will proceed w/ prior auth for this med)...    ARRHYTHMIA/ LE Edema/ BNP=857=> resolved> ?flutter w/ slow VR, we started Lasix20 & referred to Cards ASAP for further eval=> AFib/Flutter, CHB=> pacer... 6/18>    on Eliquis2.5Bid, has pacer for CHB, cards notes no further AFib on device checks...  PULM>  AB, Pulm Fibrosis>  Overall stable on prn Proair; CXR reviewed & resp exam w/o changes, stable.  HBP>  BP improved w/ low dose Amlodipine 2.5mg /d, continue same & monitor at home...  MVP>  She is essent asymptomatic w/o CP, palpit, SOB, etc...  R Carotid Bruit> CDoppler w/ 40-59% RICAstenosis, on ASA...  CHOL>  Stable on Lip20 + diet, continue same...  GI> HH, GERD>  Stable on Prilosec, continue same...  RECTAL CANCER>  As above- she is post op 11/12 & had uneventful rehab; slowly improving and diarrhea is diminished w/ Align/ Activia; she will maintain f/u w/ DrByerly & DrGessner ==> f/u colonoscopy 11/13 w/ divertics, no recurrence.  DJD/ Osteoporosis>  She remains on bisphos drug holiday, +Calcium/ MVI,/ Vit D;  BMD 08/2016 w/ lowest Tscore -2.5 in Lspine (improved) & in need of biphos drug holiday...  UTI>  Prev treated w/ Cipro empirically & currently asymptomatic...  Anxiety>  Stable & she does not require anxiolytic Rx...   Patient's Medications  New Prescriptions   ALENDRONATE (FOSAMAX) 70 MG TABLET    Take 1 tablet (70 mg total) by mouth once a week. Take with a full glass of water on an empty stomach.   TERIPARATIDE, RECOMBINANT, (FORTEO) 600 MCG/2.4ML SOLN    Inject 0.08 mLs (20 mcg total) into the skin daily.  Previous Medications   ALBUTEROL (PROVENTIL HFA;VENTOLIN HFA) 108 (90 BASE) MCG/ACT INHALER    Inhale 2 puffs into the lungs every 6 (six) hours as needed. Wheezing    AMLODIPINE (NORVASC) 5 MG TABLET    TAKE 1 TABLET BY MOUTH EVERY DAY   ASCORBIC ACID (VITAMIN C) 250 MG  CHEW    Chew 250 mg by mouth daily.    ATORVASTATIN (LIPITOR) 20 MG TABLET    Take 0.5 tablets (10 mg total) by mouth daily.   CALCIUM CARBONATE-VITAMIN D (CALCIUM-VITAMIN D) 500-200 MG-UNIT PER TABLET    Take 2 tablets by mouth daily.    CHOLECALCIFEROL (VITAMIN D3) 1000 UNITS CAPS    Take 1 capsule by mouth daily.    CYCLOBENZAPRINE (FLEXERIL) 5 MG TABLET    Take 2.5 mg by mouth at bedtime.   ELIQUIS 2.5 MG TABS TABLET    TAKE 1 TABLET BY MOUTH TWICE A DAY   FERROUS SULFATE 325 (65 FE) MG TABLET    Take 325 mg by mouth daily with breakfast.   FUROSEMIDE (LASIX) 20 MG TABLET    Take 1 tablet (20 mg total) by mouth as needed (swelling).   GABAPENTIN (NEURONTIN) 100 MG CAPSULE    Take 1-3 tabs as directed at bedtime   LOPERAMIDE (LOPERAMIDE A-D) 2 MG TABLET    Take 2 mg by mouth every 6 (six) hours as needed.   OMEPRAZOLE (PRILOSEC) 20 MG CAPSULE    Take 20 mg by mouth daily.    VITAMIN B-12 (CYANOCOBALAMIN) 250 MCG TABLET    Take 250 mcg by mouth daily.    VITAMIN E (VITAMIN E) 200 UNIT CAPSULE    Take 200 Units by mouth daily.   Modified Medications   Modified Medication Previous Medication   TRAMADOL (ULTRAM)  50 MG TABLET traMADol (ULTRAM) 50 MG tablet      Take 1 tablet (50 mg total) by mouth 3 (three) times daily as needed for moderate pain.    Take 1 tablet (50 mg total) by mouth 3 (three) times daily as needed for moderate pain.  Discontinued Medications   No medications on file

## 2018-04-21 NOTE — Telephone Encounter (Signed)
Pt called in wanting to know if Dr. Junius Roads is able to help out with a walker prescription from medical supply store she cant afford it without some help if this is at all possible

## 2018-04-21 NOTE — Telephone Encounter (Signed)
Called and spoke with Patient.  She wanted to give a up date on how she is doing. She wanted the name of the new PCP she was to follow up with.  She was referred to [place], Dr Grier Mitts, at last OV with Dr Lenna Gilford.  She stated that she is going to schedule new PCP appointment at the end of February, she missed previous appointment. She is still in [place] with her Daughter.  She stated that she should be back in  the middle of February.  Nothing further at this time.

## 2018-04-24 ENCOUNTER — Telehealth (INDEPENDENT_AMBULATORY_CARE_PROVIDER_SITE_OTHER): Payer: Self-pay | Admitting: Family Medicine

## 2018-04-24 NOTE — Telephone Encounter (Signed)
Rx signed.

## 2018-04-24 NOTE — Telephone Encounter (Signed)
Emailed order for rolling walker to pts daughter, Brooks Sailors [email or web address]

## 2018-04-24 NOTE — Telephone Encounter (Signed)
The patient is staying with her daughter, Brooks Sailors, in [place] for a few months until she gets her strength back. They are asking for an Rx for a rollator walker to aid in ambulation. Email (to [place]): [email or web address] and mailing address: [address] [place] [address].

## 2018-04-25 NOTE — Telephone Encounter (Signed)
Rx emailed and mailed.

## 2018-05-16 ENCOUNTER — Encounter: Payer: Medicare Other | Admitting: *Deleted

## 2018-05-17 ENCOUNTER — Telehealth: Payer: Self-pay

## 2018-05-17 NOTE — Telephone Encounter (Signed)
Spoke with patient to remind of missed remote transmission 

## 2018-05-24 ENCOUNTER — Encounter: Payer: Self-pay | Admitting: Cardiology

## 2018-06-02 ENCOUNTER — Ambulatory Visit (INDEPENDENT_AMBULATORY_CARE_PROVIDER_SITE_OTHER): Payer: Medicare Other | Admitting: *Deleted

## 2018-06-02 DIAGNOSIS — I442 Atrioventricular block, complete: Secondary | ICD-10-CM | POA: Diagnosis not present

## 2018-06-03 LAB — CUP PACEART REMOTE DEVICE CHECK
Battery Remaining Percentage: 95.5 %
Battery Voltage: 3.01 V
Brady Statistic RV Percent Paced: 98 %
Date Time Interrogation Session: 20200314013610
Implantable Lead Implant Date: 20170727
Implantable Lead Location: 753860
Implantable Pulse Generator Implant Date: 20170727
Lead Channel Impedance Value: 490 Ohm
Lead Channel Pacing Threshold Amplitude: 0.5 V
Lead Channel Pacing Threshold Pulse Width: 0.4 ms
Lead Channel Sensing Intrinsic Amplitude: 11.2 mV
Lead Channel Setting Pacing Amplitude: 2.5 V
Lead Channel Setting Pacing Pulse Width: 0.4 ms
Lead Channel Setting Sensing Sensitivity: 2 mV
MDC IDC MSMT BATTERY REMAINING LONGEVITY: 127 mo
Pulse Gen Model: 1272
Pulse Gen Serial Number: 7897007

## 2018-06-06 ENCOUNTER — Other Ambulatory Visit: Payer: Self-pay

## 2018-06-09 ENCOUNTER — Encounter: Payer: Self-pay | Admitting: Cardiology

## 2018-06-09 NOTE — Progress Notes (Signed)
Remote pacemaker transmission.   

## 2018-06-13 ENCOUNTER — Telehealth: Payer: Self-pay | Admitting: Pulmonary Disease

## 2018-06-13 NOTE — Telephone Encounter (Signed)
Called and spoke with Patient. Patient was requesting a POC titrate walk.  Patient had spoken with [facility] about POC. Patient had asked about [employer] cost. Advised Patient that she would have to call [employer] to find out of pocket cost.  Patient has not gotten a new PCP, because the PCP she was previously set up with, she missed OV because she was still in [place], with her Daughter.   Advised Patient to get a new PCP for follow up.  Explained that if needed, we could schedule a OV with one of our NP's, for POC qualifying. Advised Patient of CDC recommendations at this time. Patient stated she was going to check with cardiology for POC order, and call [employer] and [facility], for out of pocket POC cost. Nothing further at this time.

## 2018-06-13 NOTE — Telephone Encounter (Signed)
Called patient, she is requesting to speak to LT directly. Please advise thank you.

## 2018-07-17 ENCOUNTER — Telehealth: Payer: Self-pay | Admitting: Pulmonary Disease

## 2018-07-17 NOTE — Telephone Encounter (Signed)
Primary Pulmonologist: SN Last office visit and with whom: TN on 03/02/18 What do we see them for (pulmonary problems): acute back pain Last OV assessment/plan:  Editor: Noralee Space, MD (Physician)    Today we updated your med list in our EPIC system...    Continue your current medications the same...     We refilled your TRAMADOL per request...  Please get a back brace that is easy on & off for you to wear for pain abatement & when traveling...  We are starting the [place] application for your osteoporosis...    When it is approved we will bring you in to teach you how to give the shots...  We also discussed your Oxygen therapy foir you PULMONARY HTPERTENSION...    You need to stay on the O2 at 2L/min evenings and all night long...    OK to leave it off some during the day & when you are out & about...  Finally we discussed getting you a new primary care doctor--    As you live near [place] rec the [employer] office:     JJOACZY, DrKoberlein, DrHernandez are taking new pts      Reason for call: called and spoke with patient today regarding her O2 with [facility]. [facility] is requiring patient to come in office to be qualified for O2, and have an updated ov. SN was pt's primary care for years, and at this time during the covid-19 pandemic patient is unable to find a PCP at this time.  Pt is doing well with the oxygen at this time, in need of a qualifying walk test. Pt reports no wheezing, no fever, no chills, no travels in last few months, no one around her has been sick.   TN please advise.

## 2018-07-17 NOTE — Telephone Encounter (Signed)
LVM for patient to return call regarding [place] recommendations below. X1

## 2018-07-17 NOTE — Telephone Encounter (Signed)
Please schedule appointment for qualifying walk. I could probably see her on Friday. Thanks.

## 2018-07-18 NOTE — Telephone Encounter (Signed)
LMTCB re: [place] recommendations

## 2018-07-19 NOTE — Telephone Encounter (Signed)
Patient is returning phone call.  Patient phone number is [phone].

## 2018-07-19 NOTE — Telephone Encounter (Signed)
Returned call to patient and advised she would need a qualifying walk and visit with TN, per provider recommendation.  Patient has agreed to come to the office 07/21/18 for an 11:00 am visit with Eduardo Osier, NP for qualifying walk with OV.  Appointment confirmed.  Nothing further needed at this time.

## 2018-07-21 ENCOUNTER — Ambulatory Visit (INDEPENDENT_AMBULATORY_CARE_PROVIDER_SITE_OTHER): Payer: Medicare Other | Admitting: Nurse Practitioner

## 2018-07-21 ENCOUNTER — Other Ambulatory Visit: Payer: Self-pay

## 2018-07-21 ENCOUNTER — Encounter: Payer: Self-pay | Admitting: Nurse Practitioner

## 2018-07-21 VITALS — BP 120/76 | HR 61 | Temp 97.8°F | Ht 66.0 in | Wt 117.2 lb

## 2018-07-21 DIAGNOSIS — J8410 Pulmonary fibrosis, unspecified: Secondary | ICD-10-CM

## 2018-07-21 DIAGNOSIS — I272 Pulmonary hypertension, unspecified: Secondary | ICD-10-CM | POA: Diagnosis not present

## 2018-07-21 DIAGNOSIS — J9611 Chronic respiratory failure with hypoxia: Secondary | ICD-10-CM | POA: Diagnosis not present

## 2018-07-21 NOTE — Progress Notes (Signed)
@Patient  ID: Hannah Mercado, female    DOB: 11/12/1922, 85 y.o.   MRN: [id number]  Chief Complaint  Patient presents with  . Follow-up    Per patient, she needs to requalify for O2. Denies any current breathing issues.     Referring provider: Noralee Space, MD  HPI  85 year old female with post inflammatory pulmonary fibrosis, and asthmatic bronchitis formerly followed by Dr. Lenna Gilford.  Tests:  CXR 03/18/18 - Cardiomegaly and pulmonary arterial hypertension. No evidence for acute abnormality. No acute rib fracture or bone destruction.  CXR 08/24/16 - Chronic bibasilar coarsened opacities, left greater than right, likely scarring/ fibrosis. This is unchanged. COPD.  OV 07/21/18 - Qualifying walk for POC Patient has office visit today for qualifying walk for POC.  She states that she needs a portable oxygen concentrator to use for travel back and forth doctors visits.  She has been doing well since her last visit.  She had moved up to [place] with her daughter for a few months to recover from recent back injury.  She states that she is much improved and has moved back to [place] now.  She was previously seen by Dr. Lenna Gilford who saw her for her pulmonary hypertension and pulmonary fibrosis and was also her PCP.  She states that since she has been out of town and with the current [place] pandemic she has not been able to get in with a new PCP yet.  She plans to call to get appointment to establish care with a new PCP within the next couple weeks.  He uses O2 at 2 L at night and at times with exertion throughout the day. Denies f/c/s, n/v/d, hemoptysis, PND, leg swelling.  Note: Patient had a qualifying walk for POC - Patient dropped to 87% on RA and Sats returned to 100% when placed on 2 L [place].     Allergies  Allergen Reactions  . Sulfonamide Derivatives Nausea And Vomiting    Happened a long time ago, does not remember the other reaction she had.    Immunization History   Administered Date(s) Administered  . Influenza Split 01/02/2011, 01/03/2012, 12/20/2012  . Influenza Whole 12/20/2009  . Influenza, High Dose Seasonal PF 01/24/2014, 01/01/2015, 05/24/2016, 01/22/2018  . Influenza,inj,Quad PF,6+ Mos 01/21/2014, 12/21/2014  . Pneumococcal Conjugate-13 02/25/2014  . Pneumococcal Polysaccharide-23 03/22/2005  . Tdap 03/18/2018    Past Medical History:  Diagnosis Date  . Allergy history unknown   . Anxiety   . Asthmatic bronchitis    use inhaler prn   . Atrial fibrillation ([place]) 10/16/2015  . Blood transfusion    ? with ectopic pregnancy 50 yrs ago   . Cardiac pacemaker in situ 02/24/2016  . Carotid artery disease ([place]) 03/12/2015  . Chronic venous insufficiency 09/11/2015  . Diaphragmatic hernia 07/31/2008   Qualifier: Diagnosis of  By: Lenna Gilford MD, Deborra Medina   . Diverticulosis of colon without hemorrhage 08/02/2012  . DJD (degenerative joint disease)   . Edema 09/11/2015  . GERD (gastroesophageal reflux disease)   . Hiatal hernia   . History of pneumonia    right middle lobe  . Hypercholesteremia   . HYPERCHOLESTEROLEMIA 04/19/2007   Qualifier: Diagnosis of  By: Julien Girt CMA, Marliss Czar    . Hypertension 04/14/2011  . Mitral valve disorder 04/20/2007   Qualifier: Diagnosis of  By: Lenna Gilford MD, Deborra Medina   . Mitral valve prolapse    pt denies   . Osteopenia   . Persistent atrial fibrillation   .  PULMONARY FIBROSIS, POSTINFLAMMATORY 07/31/2008   Qualifier: Diagnosis of  By: Lenna Gilford MD, Deborra Medina   . Pulmonary fibrosis, postinflammatory ([place])    pt denies   . Rectal cancer ([place])   . Right carotid bruit 08/28/2014    Tobacco History: Social History   Tobacco Use  Smoking Status Never Smoker  Smokeless Tobacco Never Used   Counseling given: Not Answered   Outpatient Encounter Medications as of 07/21/2018  Medication Sig  . albuterol (PROVENTIL HFA;VENTOLIN HFA) 108 (90 BASE) MCG/ACT inhaler Inhale 2 puffs into the lungs every 6 (six) hours as needed. Wheezing    . alendronate (FOSAMAX) 70 MG tablet Take 1 tablet (70 mg total) by mouth once a week. Take with a full glass of water on an empty stomach.  Marland Kitchen amLODipine (NORVASC) 5 MG tablet TAKE 1 TABLET BY MOUTH EVERY DAY  . ascorbic acid (VITAMIN C) 250 MG CHEW Chew 250 mg by mouth daily.   Marland Kitchen atorvastatin (LIPITOR) 20 MG tablet Take 0.5 tablets (10 mg total) by mouth daily.  . Calcium Carbonate-Vitamin D (CALCIUM-VITAMIN D) 500-200 MG-UNIT per tablet Take 2 tablets by mouth daily.   . Cholecalciferol (VITAMIN D3) 1000 UNITS CAPS Take 1 capsule by mouth daily.   . cyclobenzaprine (FLEXERIL) 5 MG tablet Take 2.5 mg by mouth at bedtime.  Marland Kitchen ELIQUIS 2.5 MG TABS tablet TAKE 1 TABLET BY MOUTH TWICE A DAY  . ferrous sulfate 325 (65 FE) MG tablet Take 325 mg by mouth daily with breakfast.  . furosemide (LASIX) 20 MG tablet Take 1 tablet (20 mg total) by mouth as needed (swelling).  . gabapentin (NEURONTIN) 100 MG capsule Take 1-3 tabs as directed at bedtime  . loperamide (LOPERAMIDE A-D) 2 MG tablet Take 2 mg by mouth every 6 (six) hours as needed.  Marland Kitchen omeprazole (PRILOSEC) 20 MG capsule Take 20 mg by mouth daily.   . Teriparatide, Recombinant, (FORTEO) 600 MCG/2.4ML SOLN Inject 0.08 mLs (20 mcg total) into the skin daily.  . traMADol (ULTRAM) 50 MG tablet Take 1 tablet (50 mg total) by mouth 3 (three) times daily as needed for moderate pain.  . vitamin B-12 (CYANOCOBALAMIN) 250 MCG tablet Take 250 mcg by mouth daily.   . vitamin E (VITAMIN E) 200 UNIT capsule Take 200 Units by mouth daily.    Facility-Administered Encounter Medications as of 07/21/2018  Medication  . ondansetron [facility]) injection     Review of Systems  Review of Systems  Constitutional: Negative.  Negative for chills and fever.  HENT: Negative.   Respiratory: Negative for cough, shortness of breath and wheezing.   Cardiovascular: Negative.  Negative for chest pain, palpitations and leg swelling.  Gastrointestinal: Negative.    Allergic/Immunologic: Negative.   Neurological: Negative.   Psychiatric/Behavioral: Negative.        Physical Exam  BP 120/76   Pulse 61   Temp 97.8 F (36.6 C) (Oral)   Ht 5\' 6"  (1.676 m)   Wt 117 lb 3.2 oz (53.2 kg)   SpO2 99%   BMI 18.92 kg/m   Wt Readings from Last 5 Encounters:  07/21/18 117 lb 3.2 oz (53.2 kg)  03/02/18 94 lb 12.8 oz (43 kg)  02/21/18 104 lb 6.4 oz (47.4 kg)  02/17/18 109 lb (49.4 kg)  11/16/17 117 lb (53.1 kg)     Physical Exam Vitals signs and nursing note reviewed.  Constitutional:      General: She is not in acute distress.    Appearance: She is well-developed.  Cardiovascular:     Rate and Rhythm: Normal rate and regular rhythm.  Pulmonary:     Effort: Pulmonary effort is normal. No respiratory distress.     Breath sounds: Normal breath sounds. No wheezing or rhonchi.  Musculoskeletal:        General: No swelling.  Neurological:     Mental Status: She is alert and oriented to person, place, and time.      Assessment & Plan:   Chronic respiratory failure with hypoxia ([place]) - Plan: Ambulatory Referral for DME  PULMONARY FIBROSIS, POSTINFLAMMATORY  Pulmonary hypertension (H[facility])   PULMONARY FIBROSIS, POSTINFLAMMATORY Stable.  Patient denies any significant shortness of breath.  She uses oxygen as needed.  She is very active.  Patient Instructions  Patient had a qualifying walk for POC - Patient dropped to 87% on RA and Sats returned to 100% when placed on 2 L [place].  Will order POC Continue 2 L of O2 at night Use albuterol as needed  Follow up: Follow up/esatblish care with Dr. Valeta Harms in 6 months or sooner if needed    Pulmonary hypertension [facility]) Patient is stable. No new complaints.   Patient Instructions  Patient had a qualifying walk for POC - Patient dropped to 87% on RA and Sats returned to 100% when placed on 2 L [place].  Will order POC Continue 2 L of O2 at night Use albuterol as needed  Follow up: Follow  up/esatblish care with Dr. Valeta Harms in 6 months or sooner if needed       Fenton Foy, NP 07/21/2018

## 2018-07-21 NOTE — Patient Instructions (Addendum)
Patient had a qualifying walk for POC - Patient dropped to 87% on RA and Sats returned to 100% when placed on 2 L [place].  Will order POC Continue 2 L of O2 at night Use albuterol as needed  Follow up: Follow up/esatblish care with Dr. Valeta Harms in 6 months or sooner if needed

## 2018-07-21 NOTE — Assessment & Plan Note (Signed)
Patient is stable. No new complaints.   Patient Instructions  Patient had a qualifying walk for POC - Patient dropped to 87% on RA and Sats returned to 100% when placed on 2 L [place].  Will order POC Continue 2 L of O2 at night Use albuterol as needed  Follow up: Follow up/esatblish care with Dr. Valeta Harms in 6 months or sooner if needed

## 2018-07-21 NOTE — Assessment & Plan Note (Signed)
Stable.  Patient denies any significant shortness of breath.  She uses oxygen as needed.  She is very active.  Patient Instructions  Patient had a qualifying walk for POC - Patient dropped to 87% on RA and Sats returned to 100% when placed on 2 L [place].  Will order POC Continue 2 L of O2 at night Use albuterol as needed  Follow up: Follow up/esatblish care with Dr. Valeta Harms in 6 months or sooner if needed

## 2018-07-26 NOTE — Progress Notes (Signed)
PCCM: I will be glad to establish pulmonary care.  Thanks Garner Nash, DO [facility] 07/26/2018 9:21 AM

## 2018-08-31 ENCOUNTER — Other Ambulatory Visit: Payer: Self-pay | Admitting: Pulmonary Disease

## 2018-09-04 ENCOUNTER — Encounter: Payer: Medicare Other | Admitting: *Deleted

## 2018-09-05 ENCOUNTER — Telehealth: Payer: Self-pay

## 2018-09-05 NOTE — Telephone Encounter (Signed)
Left message for patient to remind of missed remote transmission.  

## 2018-09-13 ENCOUNTER — Other Ambulatory Visit: Payer: Self-pay | Admitting: Pulmonary Disease

## 2018-09-14 ENCOUNTER — Encounter: Payer: Self-pay | Admitting: Cardiology

## 2018-09-21 ENCOUNTER — Telehealth: Payer: Self-pay | Admitting: Cardiology

## 2018-09-21 ENCOUNTER — Ambulatory Visit (INDEPENDENT_AMBULATORY_CARE_PROVIDER_SITE_OTHER): Payer: Medicare Other | Admitting: *Deleted

## 2018-09-21 DIAGNOSIS — I442 Atrioventricular block, complete: Secondary | ICD-10-CM

## 2018-09-21 NOTE — Telephone Encounter (Signed)
New Message    1. Has your device fired? no  2. Is you device beeping? no  3. Are you experiencing draining or swelling at device site? no  4. Are you calling to see if we received your device transmission? no  5. Have you passed out? No  Patient is calling about a letter that she received indicating that her remote check was not received. Please call to discuss.     Please route to [place]

## 2018-09-21 NOTE — Telephone Encounter (Signed)
Spoke to pt, requested call back at [phone] to get assistance with sending transmission.

## 2018-09-21 NOTE — Telephone Encounter (Signed)
Walked pt through steps to send manual transmission.  Transmission received.

## 2018-09-26 LAB — CUP PACEART REMOTE DEVICE CHECK
Battery Remaining Longevity: 127 mo
Battery Remaining Percentage: 95.5 %
Battery Voltage: 3.01 V
Brady Statistic RV Percent Paced: 98 %
Date Time Interrogation Session: 20200702194004
Implantable Lead Implant Date: 20170727
Implantable Lead Location: 753860
Implantable Pulse Generator Implant Date: 20170727
Lead Channel Impedance Value: 510 Ohm
Lead Channel Pacing Threshold Amplitude: 0.5 V
Lead Channel Pacing Threshold Pulse Width: 0.4 ms
Lead Channel Sensing Intrinsic Amplitude: 12 mV
Lead Channel Setting Pacing Amplitude: 2.5 V
Lead Channel Setting Pacing Pulse Width: 0.4 ms
Lead Channel Setting Sensing Sensitivity: 2 mV
Pulse Gen Model: 1272
Pulse Gen Serial Number: 7897007

## 2018-09-29 ENCOUNTER — Encounter: Payer: Self-pay | Admitting: Cardiology

## 2018-09-29 NOTE — Progress Notes (Signed)
Remote pacemaker transmission.   

## 2018-10-11 ENCOUNTER — Other Ambulatory Visit: Payer: Self-pay | Admitting: *Deleted

## 2018-10-11 MED ORDER — APIXABAN 2.5 MG PO TABS: 2.5000 mg | ORAL_TABLET | Freq: Two times a day (BID) | ORAL | 5 refills | Status: AC

## 2018-10-11 NOTE — Telephone Encounter (Signed)
Eliquis 2.5mg  paper refill request from [facility] received; pt is 85 yrs old, wt-53.2kg, Crea-1.08 on 03/18/2018, last seen by Dr. Curt Bears on 8/28/201 and has an appt on 8/28/20209; will send in refill to requested pharmacy.

## 2018-11-17 ENCOUNTER — Ambulatory Visit (INDEPENDENT_AMBULATORY_CARE_PROVIDER_SITE_OTHER): Payer: Medicare Other | Admitting: Cardiology

## 2018-11-17 ENCOUNTER — Other Ambulatory Visit: Payer: Self-pay

## 2018-11-17 ENCOUNTER — Encounter: Payer: Self-pay | Admitting: Cardiology

## 2018-11-17 VITALS — BP 150/68 | HR 63 | Ht 66.0 in | Wt 123.0 lb

## 2018-11-17 DIAGNOSIS — I442 Atrioventricular block, complete: Secondary | ICD-10-CM

## 2018-11-17 LAB — CUP PACEART INCLINIC DEVICE CHECK
Date Time Interrogation Session: 20200828163438
Implantable Lead Implant Date: 20170727
Implantable Lead Location: 753860
Implantable Pulse Generator Implant Date: 20170727
Pulse Gen Model: 1272
Pulse Gen Serial Number: 7897007

## 2018-11-17 NOTE — Patient Instructions (Signed)
Medication Instructions:  Your physician recommends that you continue on your current medications as directed. Please refer to the Current Medication list given to you today.  *If you need a refill on your cardiac medications before your next appointment, please call your pharmacy*  Labwork: None ordered  Testing/Procedures: None ordered  Follow-Up: Remote monitoring is used to monitor your Pacemaker or ICD from home. This monitoring reduces the number of office visits required to check your device to one time per year. It allows [place] to keep an eye on the functioning of your device to ensure it is working properly. You are scheduled for a device check from home on 12/21/18. You may send your transmission at any time that day. If you have a wireless device, the transmission will be sent automatically. After your physician reviews your transmission, you will receive a postcard with your next transmission date.  Your physician wants you to follow-up in: 1 year with Dr. Curt Bears.  You will receive a reminder letter in the mail two months in advance. If you don't receive a letter, please call our office to schedule the follow-up appointment.  Thank you for choosing [facility]!!   Trinidad Curet, RN [phone]  Any Other Special Instructions Will Be Listed Below (If Applicable). You may call [phone] to get assist in finding a primary doctor

## 2018-11-17 NOTE — Progress Notes (Signed)
Electrophysiology Office Note   Date:  11/17/2018   ID:  Hannah, Mercado 10/31/1922, MRN [id number]  PCP:  Patient, No Pcp Per  Cardiologist:  Harrington Challenger Primary Electrophysiologist:  Dailin Sosnowski Meredith Leeds, MD    No chief complaint on file.    History of Present Illness: Hannah Mercado is a 85 y.o. female who presents today for electrophysiology evaluation.   History of AF/flutter, HLD.  Was seen in cardiology clinic 09/12/15 with AF and a HR of 39.  Pacemaker placed 10/16/15.   Today, denies symptoms of palpitations, chest pain, shortness of breath, orthopnea, PND, lower extremity edema, claudication, dizziness, presyncope, syncope, bleeding, or neurologic sequela. The patient is tolerating medications without difficulties.  Overall she is doing well.  She has no chest pain or shortness of breath.  She has had an eventful year.  She has had a spinal fracture.  She also had nutritional issues and got down to 94 pounds.  She moved in with her daughter who changed her diet and she has gotten back up to her base weight.  Her back continues to bother her which feels like a dull ache and not a sharp pain.  Otherwise she has done well.  Past Medical History:  Diagnosis Date  . Allergy history unknown   . Anxiety   . Asthmatic bronchitis    use inhaler prn   . Atrial fibrillation ([place]) 10/16/2015  . Blood transfusion    ? with ectopic pregnancy 50 yrs ago   . Cardiac pacemaker in situ 02/24/2016  . Carotid artery disease ([place]) 03/12/2015  . Chronic venous insufficiency 09/11/2015  . Diaphragmatic hernia 07/31/2008   Qualifier: Diagnosis of  By: Lenna Gilford MD, Deborra Medina   . Diverticulosis of colon without hemorrhage 08/02/2012  . DJD (degenerative joint disease)   . Edema 09/11/2015  . GERD (gastroesophageal reflux disease)   . Hiatal hernia   . History of pneumonia    right middle lobe  . Hypercholesteremia   . HYPERCHOLESTEROLEMIA 04/19/2007   Qualifier: Diagnosis of  By: Julien Girt  CMA, Marliss Czar    . Hypertension 04/14/2011  . Mitral valve disorder 04/20/2007   Qualifier: Diagnosis of  By: Lenna Gilford MD, Deborra Medina   . Mitral valve prolapse    pt denies   . Osteopenia   . Persistent atrial fibrillation   . PULMONARY FIBROSIS, POSTINFLAMMATORY 07/31/2008   Qualifier: Diagnosis of  By: Lenna Gilford MD, Deborra Medina   . Pulmonary fibrosis, postinflammatory ([place])    pt denies   . Rectal cancer ([place])   . Right carotid bruit 08/28/2014   Past Surgical History:  Procedure Laterality Date  . APPENDECTOMY    . COLON RESECTION  02/08/2011   Procedure: LAPAROSCOPIC SIGMOID COLON RESECTION;  Surgeon: Stark Klein, MD;  Location: [facility];  Service: General;  Laterality: N/A;  Laparoscopic Lower Anterior Bowel Resection  . COLON SURGERY    . COLONOSCOPY  11/27/10   rectal cancer, small sigmoid adenoma, diverticulosis  . ECTOPIC PREGNANCY SURGERY     Midline incision  . EP IMPLANTABLE DEVICE N/A 10/16/2015   Procedure: Pacemaker Implant;  Surgeon: Talyssa Gibas Meredith Leeds, MD;  Location: [facility];  Service: Cardiovascular;  Laterality: N/A;  . HAMMER TOE SURGERY  1995   Dr. Silverio Decamp  . Laparoscopic Left oophorectomy, laparoscopic low anterior resection, placement of OnQ pain pump  02/08/2011  . NASAL SINUS SURGERY  1994  . OVARIAN CYST REMOVAL  02/08/2011   Procedure: OVARIAN CYSTECTOMY;  Surgeon:  Stark Klein, MD;  Location: [facility];  Service: General;  Laterality: Left;  Removal of Left Ovary and Pelvic Mass  . VAGINAL HYSTERECTOMY  1980's     Current Outpatient Medications  Medication Sig Dispense Refill  . albuterol (PROVENTIL HFA;VENTOLIN HFA) 108 (90 BASE) MCG/ACT inhaler Inhale 2 puffs into the lungs every 6 (six) hours as needed. Wheezing     . amLODipine (NORVASC) 5 MG tablet TAKE 1 TABLET BY MOUTH EVERY DAY 30 tablet 5  . apixaban (ELIQUIS) 2.5 MG TABS tablet Take 1 tablet (2.5 mg total) by mouth 2 (two) times daily. 60 tablet 5  . ascorbic acid (VITAMIN C) 250 MG CHEW Chew 250  mg by mouth daily.     Marland Kitchen atorvastatin (LIPITOR) 20 MG tablet Take 0.5 tablets (10 mg total) by mouth daily. 90 tablet 1  . Calcium Carbonate-Vitamin D (CALCIUM-VITAMIN D) 500-200 MG-UNIT per tablet Take 2 tablets by mouth daily.     . Cholecalciferol (VITAMIN D3) 1000 UNITS CAPS Take 1 capsule by mouth daily.     . ferrous sulfate 325 (65 FE) MG tablet Take 325 mg by mouth daily with breakfast.    . furosemide (LASIX) 20 MG tablet TAKE 1 TABLET BY MOUTH AS NEEDED FOR SWELLING 30 tablet 2  . gabapentin (NEURONTIN) 100 MG capsule Take 1-3 tabs as directed at bedtime 90 capsule 0  . loperamide (LOPERAMIDE A-D) 2 MG tablet Take 2 mg by mouth every 6 (six) hours as needed.    Marland Kitchen omeprazole (PRILOSEC) 20 MG capsule Take 20 mg by mouth daily.     . Teriparatide, Recombinant, (FORTEO) 600 MCG/2.4ML SOLN Inject 0.08 mLs (20 mcg total) into the skin daily. 2.24 mL 12  . traMADol (ULTRAM) 50 MG tablet Take 1 tablet (50 mg total) by mouth 3 (three) times daily as needed for moderate pain. 90 tablet 5  . vitamin B-12 (CYANOCOBALAMIN) 250 MCG tablet Take 250 mcg by mouth daily.     . vitamin E (VITAMIN E) 200 UNIT capsule Take 200 Units by mouth daily.      No current facility-administered medications for this visit.    Facility-Administered Medications Ordered in Other Visits  Medication Dose Route Frequency Provider Last Rate Last Dose  . ondansetron (ZOFRAN) injection    PRN Ofilia Neas, CRNA   1 mg at 02/08/11 7371    Allergies:   Sulfonamide derivatives   Social History:  The patient  reports that she has never smoked. She has never used smokeless tobacco. She reports that she does not drink alcohol or use drugs.   Family History:  The patient's family history includes Heart disease in her father; Lung cancer in her brother.   ROS:  Please see the history of present illness.   Otherwise, review of systems is positive for none.   All other systems are reviewed and negative.   PHYSICAL EXAM: VS:   BP (!) 150/68   Pulse 63   Ht 5\' 6"  (1.676 m)   Wt 123 lb (55.8 kg)   SpO2 98%   BMI 19.85 kg/m  , BMI Body mass index is 19.85 kg/m. GEN: Well nourished, well developed, in no acute distress  HEENT: normal  Neck: no JVD, carotid bruits, or masses Cardiac: RRR; no murmurs, rubs, or gallops,no edema  Respiratory:  clear to auscultation bilaterally, normal work of breathing GI: soft, nontender, nondistended, + BS MS: no deformity or atrophy  Skin: warm and dry, device site well healed Neuro:  Strength and sensation are intact Psych: euthymic mood, full affect  EKG:  EKG is ordered today. Personal review of the ekg ordered shows AF, V paced  Personal review of the device interrogation today. Results in [place]: 03/18/2018: ALT 12; BUN 20; Creatinine, Ser 1.08; Hemoglobin 12.7; Platelets 251; Potassium 4.4; Sodium 138    Lipid Panel     Component Value Date/Time   CHOL 176 08/31/2017 1005   TRIG 81.0 08/31/2017 1005   HDL 84.50 08/31/2017 1005   CHOLHDL 2 08/31/2017 1005   VLDL 16.2 08/31/2017 1005   LDLCALC 76 08/31/2017 1005     Wt Readings from Last 3 Encounters:  11/17/18 123 lb (55.8 kg)  07/21/18 117 lb 3.2 oz (53.2 kg)  03/02/18 94 lb 12.8 oz (43 kg)      Other studies Reviewed: Additional studies/ records that were reviewed today include: TTE 09/16/15  Review of the above records today demonstrates:  - Left ventricle: The cavity size was normal. There was mild  concentric hypertrophy. Systolic function was normal. The  estimated ejection fraction was in the range of 55% to 60%. Wall  motion was normal; there were no regional wall motion  abnormalities. The study is not technically sufficient to allow  evaluation of LV diastolic function. - Aortic valve: Trileaflet; mildly thickened, mildly calcified  leaflets. There was mild regurgitation. - Mitral valve: Calcified annulus. Moderate prolapse, involving the  anterior leaflet. There  was moderate regurgitation directed  posteriorly. - Left atrium: The atrium was severely dilated. - Right atrium: The atrium was severely dilated. - Tricuspid valve: There was moderate regurgitation. - Pulmonary arteries: Systolic pressure was severely increased. PA  peak pressure: 80 mm Hg (S).   ASSESSMENT AND PLAN:  1.  Permanent Atrial fibrillation: Currently on Eliquis.  Rate controlled with ventricular pacing.  No changes.    This patients CHA2DS2-VASc Score and unadjusted Ischemic Stroke Rate (% per year) is equal to 4.8 % stroke rate/year from a score of 4  Above score calculated as 1 point each if present [CHF, HTN, DM, Vascular=MI/PAD/Aortic Plaque, Age if 65-74, or Female] Above score calculated as 2 points each if present [Age > 75, or Stroke/TIA/TE]   2. Complete heart block: This post [place] dual-chamber pacemaker.  Device functioning appropriately.  No changes.  3. Hypertension: Evaded today but usually well controlled.  No changes.  Current medicines are reviewed at length with the patient today.   The patient does not have concerns regarding her medicines.  The following changes were made today: None  Labs/ tests ordered today include:  Orders Placed This Encounter  Procedures  . EKG 12-Lead     Disposition:   FU with Tawyna Pellot 12 months  Signed, Joshuajames Moehring Meredith Leeds, MD  11/17/2018 3:05 PM     [facility] [address] [place] [address] [phone] (office) [phone] (fax)

## 2018-12-04 DIAGNOSIS — Z23 Encounter for immunization: Secondary | ICD-10-CM | POA: Diagnosis not present

## 2018-12-21 ENCOUNTER — Ambulatory Visit (INDEPENDENT_AMBULATORY_CARE_PROVIDER_SITE_OTHER): Payer: Medicare Other | Admitting: *Deleted

## 2018-12-21 DIAGNOSIS — I442 Atrioventricular block, complete: Secondary | ICD-10-CM

## 2018-12-21 LAB — CUP PACEART REMOTE DEVICE CHECK
Battery Remaining Longevity: 128 mo
Battery Remaining Percentage: 95.5 %
Battery Voltage: 3.01 V
Brady Statistic RV Percent Paced: 99 %
Date Time Interrogation Session: 20201001060023
Implantable Lead Implant Date: 20170727
Implantable Lead Location: 753860
Implantable Pulse Generator Implant Date: 20170727
Lead Channel Impedance Value: 530 Ohm
Lead Channel Pacing Threshold Amplitude: 0.5 V
Lead Channel Pacing Threshold Pulse Width: 0.4 ms
Lead Channel Sensing Intrinsic Amplitude: 12 mV
Lead Channel Setting Pacing Amplitude: 2.5 V
Lead Channel Setting Pacing Pulse Width: 0.4 ms
Lead Channel Setting Sensing Sensitivity: 2 mV
Pulse Gen Model: 1272
Pulse Gen Serial Number: 7897007

## 2018-12-28 ENCOUNTER — Encounter: Payer: Self-pay | Admitting: Cardiology

## 2018-12-28 NOTE — Progress Notes (Signed)
Remote pacemaker transmission.   

## 2019-01-12 ENCOUNTER — Telehealth: Payer: Self-pay | Admitting: Pulmonary Disease

## 2019-01-12 NOTE — Telephone Encounter (Signed)
Called and spoke to patient.  Patient reported that she would like to make an appointment with Dr. Valeta Harms due to her previously noted lung conditions.  Patient was reminded that since she also saw Dr. Lenna Gilford as her primary care that she would need to establish with someone else.  Patient stated that Dr. Lenna Gilford had let her know that and she will do so.  Appointment scheduled for patient in a 30 minute spot with Dr. Valeta Harms.  Nothing further needed at this time.

## 2019-01-24 ENCOUNTER — Other Ambulatory Visit: Payer: Self-pay

## 2019-01-24 ENCOUNTER — Ambulatory Visit (INDEPENDENT_AMBULATORY_CARE_PROVIDER_SITE_OTHER): Payer: Medicare Other | Admitting: Pulmonary Disease

## 2019-01-24 ENCOUNTER — Encounter: Payer: Self-pay | Admitting: Pulmonary Disease

## 2019-01-24 VITALS — BP 132/72 | HR 63 | Temp 97.9°F | Ht 67.0 in | Wt 124.4 lb

## 2019-01-24 DIAGNOSIS — J8410 Pulmonary fibrosis, unspecified: Secondary | ICD-10-CM | POA: Diagnosis not present

## 2019-01-24 DIAGNOSIS — I272 Pulmonary hypertension, unspecified: Secondary | ICD-10-CM | POA: Diagnosis not present

## 2019-01-24 DIAGNOSIS — I10 Essential (primary) hypertension: Secondary | ICD-10-CM | POA: Diagnosis not present

## 2019-01-24 DIAGNOSIS — J9611 Chronic respiratory failure with hypoxia: Secondary | ICD-10-CM | POA: Diagnosis not present

## 2019-01-24 NOTE — Patient Instructions (Addendum)
Thank you for visiting Dr. Valeta Harms at [facility]. Today we recommend the following:  Continue current albuterol as needed.  Continue lasix for fluid management.  Please call with any questions or concerned  Return in about 1 year (around 01/24/2020), or if symptoms worsen or fail to improve.    Please do your part to reduce the spread of COVID-19.

## 2019-01-24 NOTE — Progress Notes (Signed)
Synopsis: Referred in October 2020 for former patient Dr. Lenna Gilford, here to establish care with new primary pulmonary provider.   Subjective:   PATIENT ID: Hannah Mercado GENDER: female DOB: [birth year], MRN: [id number]  Chief Complaint  Patient presents with  . Follow-up    Former [facility] pt. Needs a PCP. Has oxygen at home that she uses. She uses 2L.     This is a 85 year old female with a history of atrial fibrillation, cardiac pacemaker, CAD, postinflammatory pulmonary fibrosis.  Patient was last seen in our office in May 2020.  Previous imaging chest x-ray with chronic basilar scarring and fibrosis.  Currently chronic hypoxemic respiratory failure with use of POC also uses 2 L nasal cannula at night.  Patient has a diagnosis of pulmonary hypertension presumed related to her underlying lung disease.  Only documented CT imaging that I can see includes a 2018 CT abdomen pelvis which gives [place] a glimpse of the base of her lungs which does reveal areas of peripheral ILD.  Echocardiogram in June 2019 reveals a normal ejection fraction 55 to 60%.  PA peak systolic pressure of 60 mmHg.  Patient has a dilated IVC suggestive of elevated RV filling pressures.  OV 01/24/2019: Patient here to establish care with new primary pulmonary provider. Overall, patient doing well. She lost some weight this past year. She moved in with her family for a few months. She is eating better. She lives independently. She drives short distances. Son lives here in [place] and helps regularly. Patients breathing doing better. She has been followed for the past several years for pulmonary fibrosis. She is a life long non smoker.    Past Medical History:  Diagnosis Date  . Allergy history unknown   . Anxiety   . Asthmatic bronchitis    use inhaler prn   . Atrial fibrillation ([place]) 10/16/2015  . Blood transfusion    ? with ectopic pregnancy 50 yrs ago   . Cardiac pacemaker in situ 02/24/2016  . Carotid artery disease ([place])  03/12/2015  . Chronic venous insufficiency 09/11/2015  . Diaphragmatic hernia 07/31/2008   Qualifier: Diagnosis of  By: Lenna Gilford MD, Deborra Medina   . Diverticulosis of colon without hemorrhage 08/02/2012  . DJD (degenerative joint disease)   . Edema 09/11/2015  . GERD (gastroesophageal reflux disease)   . Hiatal hernia   . History of pneumonia    right middle lobe  . Hypercholesteremia   . HYPERCHOLESTEROLEMIA 04/19/2007   Qualifier: Diagnosis of  By: Julien Girt CMA, Marliss Czar    . Hypertension 04/14/2011  . Mitral valve disorder 04/20/2007   Qualifier: Diagnosis of  By: Lenna Gilford MD, Deborra Medina   . Mitral valve prolapse    pt denies   . Osteopenia   . Persistent atrial fibrillation ([place])   . PULMONARY FIBROSIS, POSTINFLAMMATORY 07/31/2008   Qualifier: Diagnosis of  By: Lenna Gilford MD, Deborra Medina   . Pulmonary fibrosis, postinflammatory ([place])    pt denies   . Rectal cancer ([place])   . Right carotid bruit 08/28/2014     Family History  Problem Relation Age of Onset  . Heart disease Father   . Lung cancer Brother        x 2  . Colon cancer Neg Hx      Past Surgical History:  Procedure Laterality Date  . APPENDECTOMY    . COLON RESECTION  02/08/2011   Procedure: LAPAROSCOPIC SIGMOID COLON RESECTION;  Surgeon: Stark Klein, MD;  Location: [facility];  Service: General;  Laterality: N/A;  Laparoscopic Lower Anterior Bowel Resection  . COLON SURGERY    . COLONOSCOPY  11/27/10   rectal cancer, small sigmoid adenoma, diverticulosis  . ECTOPIC PREGNANCY SURGERY     Midline incision  . EP IMPLANTABLE DEVICE N/A 10/16/2015   Procedure: Pacemaker Implant;  Surgeon: Will Meredith Leeds, MD;  Location: [facility];  Service: Cardiovascular;  Laterality: N/A;  . HAMMER TOE SURGERY  1995   Dr. Silverio Decamp  . Laparoscopic Left oophorectomy, laparoscopic low anterior resection, placement of OnQ pain pump  02/08/2011  . NASAL SINUS SURGERY  1994  . OVARIAN CYST REMOVAL  02/08/2011   Procedure: OVARIAN CYSTECTOMY;  Surgeon:  Stark Klein, MD;  Location: [facility];  Service: General;  Laterality: Left;  Removal of Left Ovary and Pelvic Mass  . VAGINAL HYSTERECTOMY  1980's    Social History   Socioeconomic History  . Marital status: Widowed    Spouse name: Not on file  . Number of children: 2  . Years of education: Not on file  . Highest education level: Not on file  Occupational History  . Occupation: housewife    Employer: RETIRED  Social Needs  . Financial resource strain: Not on file  . Food insecurity    Worry: Not on file    Inability: Not on file  . Transportation needs    Medical: Not on file    Non-medical: Not on file  Tobacco Use  . Smoking status: Never Smoker  . Smokeless tobacco: Never Used  Substance and Sexual Activity  . Alcohol use: No  . Drug use: No  . Sexual activity: Not on file  Lifestyle  . Physical activity    Days per week: Not on file    Minutes per session: Not on file  . Stress: Not on file  Relationships  . Social Herbalist on phone: Not on file    Gets together: Not on file    Attends religious service: Not on file    Active member of club or organization: Not on file    Attends meetings of clubs or organizations: Not on file    Relationship status: Not on file  . Intimate partner violence    Fear of current or ex partner: Not on file    Emotionally abused: Not on file    Physically abused: Not on file    Forced sexual activity: Not on file  Other Topics Concern  . Not on file  Social History Narrative  . Not on file     Allergies  Allergen Reactions  . Sulfonamide Derivatives Nausea And Vomiting    Happened a long time ago, does not remember the other reaction she had.     Outpatient Medications Prior to Visit  Medication Sig Dispense Refill  . albuterol (PROVENTIL HFA;VENTOLIN HFA) 108 (90 BASE) MCG/ACT inhaler Inhale 2 puffs into the lungs every 6 (six) hours as needed. Wheezing     . ALENDRONATE SODIUM PO Take 75 mg by mouth once a  week.    Marland Kitchen amLODipine (NORVASC) 5 MG tablet TAKE 1 TABLET BY MOUTH EVERY DAY 30 tablet 5  . apixaban (ELIQUIS) 2.5 MG TABS tablet Take 1 tablet (2.5 mg total) by mouth 2 (two) times daily. 60 tablet 5  . ascorbic acid (VITAMIN C) 250 MG CHEW Chew 250 mg by mouth daily.     Marland Kitchen atorvastatin (LIPITOR) 20 MG tablet Take 0.5 tablets (10 mg total) by mouth daily.  90 tablet 1  . Calcium Carbonate-Vitamin D (CALCIUM-VITAMIN D) 500-200 MG-UNIT per tablet Take 2 tablets by mouth daily.     . Cholecalciferol (VITAMIN D3) 1000 UNITS CAPS Take 1 capsule by mouth daily.     . ferrous sulfate 325 (65 FE) MG tablet Take 325 mg by mouth daily with breakfast.    . furosemide (LASIX) 20 MG tablet TAKE 1 TABLET BY MOUTH AS NEEDED FOR SWELLING 30 tablet 2  . gabapentin (NEURONTIN) 100 MG capsule Take 1-3 tabs as directed at bedtime 90 capsule 0  . loperamide (LOPERAMIDE A-D) 2 MG tablet Take 2 mg by mouth every 6 (six) hours as needed.    Marland Kitchen omeprazole (PRILOSEC) 20 MG capsule Take 20 mg by mouth daily.     . Teriparatide, Recombinant, (FORTEO) 600 MCG/2.4ML SOLN Inject 0.08 mLs (20 mcg total) into the skin daily. 2.24 mL 12  . traMADol (ULTRAM) 50 MG tablet Take 1 tablet (50 mg total) by mouth 3 (three) times daily as needed for moderate pain. 90 tablet 5  . vitamin B-12 (CYANOCOBALAMIN) 250 MCG tablet Take 250 mcg by mouth daily.     . vitamin E (VITAMIN E) 200 UNIT capsule Take 200 Units by mouth daily.      Facility-Administered Medications Prior to Visit  Medication Dose Route Frequency Provider Last Rate Last Dose  . ondansetron (ZOFRAN) injection    PRN Ofilia Neas, CRNA   1 mg at 02/08/11 0915    Review of Systems  Constitutional: Negative for chills, fever, malaise/fatigue and weight loss.  HENT: Negative for hearing loss, sore throat and tinnitus.   Eyes: Negative for blurred vision and double vision.  Respiratory: Negative for cough, hemoptysis, sputum production, shortness of breath, wheezing and  stridor.   Cardiovascular: Negative for chest pain, palpitations, orthopnea, leg swelling and PND.  Gastrointestinal: Negative for abdominal pain, constipation, diarrhea, heartburn, nausea and vomiting.  Genitourinary: Negative for dysuria, hematuria and urgency.  Musculoskeletal: Negative for joint pain and myalgias.  Skin: Negative for itching and rash.  Neurological: Negative for dizziness, tingling, weakness and headaches.  Endo/Heme/Allergies: Negative for environmental allergies. Does not bruise/bleed easily.  Psychiatric/Behavioral: Negative for depression. The patient is not nervous/anxious and does not have insomnia.   All other systems reviewed and are negative.    Objective:  Physical Exam Vitals signs reviewed.  Constitutional:      General: She is not in acute distress.    Appearance: She is well-developed.     Comments: Thin, frail  HENT:     Head: Normocephalic and atraumatic.  Eyes:     General: No scleral icterus.    Conjunctiva/sclera: Conjunctivae normal.     Pupils: Pupils are equal, round, and reactive to light.  Neck:     Musculoskeletal: Neck supple.     Vascular: No JVD.     Trachea: No tracheal deviation.  Cardiovascular:     Rate and Rhythm: Normal rate and regular rhythm.     Heart sounds: Normal heart sounds. No murmur.  Pulmonary:     Effort: Pulmonary effort is normal. No tachypnea, accessory muscle usage or respiratory distress.     Breath sounds: Normal breath sounds. No stridor. No wheezing, rhonchi or rales.  Abdominal:     General: Bowel sounds are normal. There is no distension.     Palpations: Abdomen is soft.     Tenderness: There is no abdominal tenderness.  Musculoskeletal:        General: No tenderness.  Lymphadenopathy:     Cervical: No cervical adenopathy.  Skin:    General: Skin is warm and dry.     Capillary Refill: Capillary refill takes less than 2 seconds.     Findings: No rash.  Neurological:     Mental Status: She is  alert and oriented to person, place, and time.  Psychiatric:        Behavior: Behavior normal.      Vitals:   01/24/19 1442  BP: 132/72  Pulse: 63  Temp: 97.9 F (36.6 C)  TempSrc: Temporal  SpO2: 97%  Weight: 124 lb 6.4 oz (56.4 kg)  Height: 5\' 7"  (1.702 m)   97% on RA BMI Readings from Last 3 Encounters:  01/24/19 19.48 kg/m  11/17/18 19.85 kg/m  07/21/18 18.92 kg/m   Wt Readings from Last 3 Encounters:  01/24/19 124 lb 6.4 oz (56.4 kg)  11/17/18 123 lb (55.8 kg)  07/21/18 117 lb 3.2 oz (53.2 kg)     CBC    Component Value Date/Time   WBC 8.2 03/18/2018 1505   RBC 3.77 (L) 03/18/2018 1505   HGB 12.7 03/18/2018 1505   HCT 40.6 03/18/2018 1505   PLT 251 03/18/2018 1505   MCV 107.7 (H) 03/18/2018 1505   MCH 33.7 03/18/2018 1505   MCHC 31.3 03/18/2018 1505   RDW 15.4 03/18/2018 1505   LYMPHSABS 1.1 08/31/2017 1005   MONOABS 0.4 08/31/2017 1005   EOSABS 0.1 08/31/2017 1005   BASOSABS 0.0 08/31/2017 1005     Chest Imaging: Patient has had no axial CT imaging of the chest.  2018 CT abdomen and pelvis which reveals some ILD in the base of the lower lobes.  Pulmonary Functions Testing Results: No flowsheet data found.  FeNO: None   Pathology: None   Echocardiogram: None   Heart Catheterization: None     Assessment & Plan:     ICD-10-CM   1. Chronic hypoxemic respiratory failure (HCC)  J96.11   2. PULMONARY FIBROSIS, POSTINFLAMMATORY  J84.10   3. Pulmonary hypertension (HCC)  I27.20   4. Essential hypertension  I10 Ambulatory referral to Family Practice    Discussion:  This is a 85 year old female that has been followed for greater than 25 years by Dr. Clare Charon for lower lobe pulmonary fibrosis chronic hypoxemic respiratory failure and pulmonary hypertension.  I think these are all well-maintained at this time.  She is not had any progression or change in her respiratory symptoms for several years.  Otherwise doing well at this time.  Volume  status managed with Lasix.  It does not seem that there has ever been a clear etiology of her lower lobe fibrosis that was determined.  But at this state and her current age I do not believe that would change management at this time.  As she has no real significant symptoms and is stable.  Last set of imaging was available from a CT abdomen and pelvis that can see the lower lobes in 2018.  Plan: Continue albuterol as needed for shortness of breath wheezing. She would like to establish care with new primary care doctor. I have placed a referral for this. Patient to follow-up in our clinic as needed or if symptoms worsen or change. Continue Lasix for volume management.  Patient to return to clinic in 1 year.  Greater than 50% of this patient's 15-minute of visit was spent face-to-face discussing above recommendations and treatment plan.   Current Outpatient Medications:  .  albuterol (PROVENTIL HFA;VENTOLIN  HFA) 108 (90 BASE) MCG/ACT inhaler, Inhale 2 puffs into the lungs every 6 (six) hours as needed. Wheezing , Disp: , Rfl:  .  ALENDRONATE SODIUM PO, Take 75 mg by mouth once a week., Disp: , Rfl:  .  amLODipine (NORVASC) 5 MG tablet, TAKE 1 TABLET BY MOUTH EVERY DAY, Disp: 30 tablet, Rfl: 5 .  apixaban (ELIQUIS) 2.5 MG TABS tablet, Take 1 tablet (2.5 mg total) by mouth 2 (two) times daily., Disp: 60 tablet, Rfl: 5 .  ascorbic acid (VITAMIN C) 250 MG CHEW, Chew 250 mg by mouth daily. , Disp: , Rfl:  .  atorvastatin (LIPITOR) 20 MG tablet, Take 0.5 tablets (10 mg total) by mouth daily., Disp: 90 tablet, Rfl: 1 .  Calcium Carbonate-Vitamin D (CALCIUM-VITAMIN D) 500-200 MG-UNIT per tablet, Take 2 tablets by mouth daily. , Disp: , Rfl:  .  Cholecalciferol (VITAMIN D3) 1000 UNITS CAPS, Take 1 capsule by mouth daily. , Disp: , Rfl:  .  ferrous sulfate 325 (65 FE) MG tablet, Take 325 mg by mouth daily with breakfast., Disp: , Rfl:  .  furosemide (LASIX) 20 MG tablet, TAKE 1 TABLET BY MOUTH AS NEEDED  FOR SWELLING, Disp: 30 tablet, Rfl: 2 .  gabapentin (NEURONTIN) 100 MG capsule, Take 1-3 tabs as directed at bedtime, Disp: 90 capsule, Rfl: 0 .  loperamide (LOPERAMIDE A-D) 2 MG tablet, Take 2 mg by mouth every 6 (six) hours as needed., Disp: , Rfl:  .  omeprazole (PRILOSEC) 20 MG capsule, Take 20 mg by mouth daily. , Disp: , Rfl:  .  Teriparatide, Recombinant, (FORTEO) 600 MCG/2.4ML SOLN, Inject 0.08 mLs (20 mcg total) into the skin daily., Disp: 2.24 mL, Rfl: 12 .  traMADol (ULTRAM) 50 MG tablet, Take 1 tablet (50 mg total) by mouth 3 (three) times daily as needed for moderate pain., Disp: 90 tablet, Rfl: 5 .  vitamin B-12 (CYANOCOBALAMIN) 250 MCG tablet, Take 250 mcg by mouth daily. , Disp: , Rfl:  .  vitamin E (VITAMIN E) 200 UNIT capsule, Take 200 Units by mouth daily. , Disp: , Rfl:  No current facility-administered medications for this visit.   Facility-Administered Medications Ordered in Other Visits:  .  ondansetron (ZOFRAN) injection, , , PRN, Ofilia Neas, CRNA, 1 mg at 02/08/11 [place], DO [facility] 01/24/2019 3:02 PM

## 2019-01-31 ENCOUNTER — Other Ambulatory Visit: Payer: Self-pay | Admitting: Pulmonary Disease

## 2019-02-15 ENCOUNTER — Encounter (HOSPITAL_COMMUNITY): Payer: Self-pay | Admitting: *Deleted

## 2019-02-15 ENCOUNTER — Emergency Department (HOSPITAL_COMMUNITY): Payer: Medicare Other

## 2019-02-15 ENCOUNTER — Other Ambulatory Visit: Payer: Self-pay

## 2019-02-15 ENCOUNTER — Inpatient Hospital Stay (HOSPITAL_COMMUNITY)
Admission: EM | Admit: 2019-02-15 | Discharge: 2019-02-19 | DRG: 480 | Disposition: A | Discharge status: Rehabilitation Facility | Payer: Medicare Other | Attending: Family Medicine | Admitting: Family Medicine

## 2019-02-15 DIAGNOSIS — I129 Hypertensive chronic kidney disease with stage 1 through stage 4 chronic kidney disease, or unspecified chronic kidney disease: Secondary | ICD-10-CM | POA: Diagnosis present

## 2019-02-15 DIAGNOSIS — Z20828 Contact with and (suspected) exposure to other viral communicable diseases: Secondary | ICD-10-CM | POA: Diagnosis present

## 2019-02-15 DIAGNOSIS — Z801 Family history of malignant neoplasm of trachea, bronchus and lung: Secondary | ICD-10-CM

## 2019-02-15 DIAGNOSIS — Z681 Body mass index (BMI) 19 or less, adult: Secondary | ICD-10-CM | POA: Diagnosis not present

## 2019-02-15 DIAGNOSIS — E785 Hyperlipidemia, unspecified: Secondary | ICD-10-CM | POA: Diagnosis present

## 2019-02-15 DIAGNOSIS — Z90721 Acquired absence of ovaries, unilateral: Secondary | ICD-10-CM

## 2019-02-15 DIAGNOSIS — I4819 Other persistent atrial fibrillation: Secondary | ICD-10-CM | POA: Diagnosis present

## 2019-02-15 DIAGNOSIS — S72001A Fracture of unspecified part of neck of right femur, initial encounter for closed fracture: Secondary | ICD-10-CM | POA: Diagnosis present

## 2019-02-15 DIAGNOSIS — N1831 Chronic kidney disease, stage 3a: Secondary | ICD-10-CM | POA: Diagnosis present

## 2019-02-15 DIAGNOSIS — W1811XA Fall from or off toilet without subsequent striking against object, initial encounter: Secondary | ICD-10-CM | POA: Diagnosis present

## 2019-02-15 DIAGNOSIS — I272 Pulmonary hypertension, unspecified: Secondary | ICD-10-CM | POA: Diagnosis present

## 2019-02-15 DIAGNOSIS — Z85048 Personal history of other malignant neoplasm of rectum, rectosigmoid junction, and anus: Secondary | ICD-10-CM | POA: Diagnosis not present

## 2019-02-15 DIAGNOSIS — F419 Anxiety disorder, unspecified: Secondary | ICD-10-CM | POA: Diagnosis present

## 2019-02-15 DIAGNOSIS — R52 Pain, unspecified: Secondary | ICD-10-CM | POA: Diagnosis not present

## 2019-02-15 DIAGNOSIS — J8410 Pulmonary fibrosis, unspecified: Secondary | ICD-10-CM | POA: Diagnosis present

## 2019-02-15 DIAGNOSIS — E7800 Pure hypercholesterolemia, unspecified: Secondary | ICD-10-CM | POA: Diagnosis present

## 2019-02-15 DIAGNOSIS — I2510 Atherosclerotic heart disease of native coronary artery without angina pectoris: Secondary | ICD-10-CM | POA: Diagnosis present

## 2019-02-15 DIAGNOSIS — S72141A Displaced intertrochanteric fracture of right femur, initial encounter for closed fracture: Secondary | ICD-10-CM | POA: Diagnosis not present

## 2019-02-15 DIAGNOSIS — S72009S Fracture of unspecified part of neck of unspecified femur, sequela: Secondary | ICD-10-CM | POA: Diagnosis not present

## 2019-02-15 DIAGNOSIS — Z79899 Other long term (current) drug therapy: Secondary | ICD-10-CM

## 2019-02-15 DIAGNOSIS — Z66 Do not resuscitate: Secondary | ICD-10-CM | POA: Diagnosis present

## 2019-02-15 DIAGNOSIS — M810 Age-related osteoporosis without current pathological fracture: Secondary | ICD-10-CM | POA: Diagnosis present

## 2019-02-15 DIAGNOSIS — I779 Disorder of arteries and arterioles, unspecified: Secondary | ICD-10-CM | POA: Diagnosis present

## 2019-02-15 DIAGNOSIS — Z8249 Family history of ischemic heart disease and other diseases of the circulatory system: Secondary | ICD-10-CM

## 2019-02-15 DIAGNOSIS — Z79891 Long term (current) use of opiate analgesic: Secondary | ICD-10-CM | POA: Diagnosis not present

## 2019-02-15 DIAGNOSIS — Z7901 Long term (current) use of anticoagulants: Secondary | ICD-10-CM | POA: Diagnosis not present

## 2019-02-15 DIAGNOSIS — Z950 Presence of cardiac pacemaker: Secondary | ICD-10-CM | POA: Diagnosis present

## 2019-02-15 DIAGNOSIS — K5900 Constipation, unspecified: Secondary | ICD-10-CM | POA: Diagnosis present

## 2019-02-15 DIAGNOSIS — S72141D Displaced intertrochanteric fracture of right femur, subsequent encounter for closed fracture with routine healing: Secondary | ICD-10-CM | POA: Diagnosis present

## 2019-02-15 DIAGNOSIS — W19XXXA Unspecified fall, initial encounter: Secondary | ICD-10-CM | POA: Diagnosis not present

## 2019-02-15 DIAGNOSIS — K219 Gastro-esophageal reflux disease without esophagitis: Secondary | ICD-10-CM | POA: Diagnosis present

## 2019-02-15 DIAGNOSIS — I10 Essential (primary) hypertension: Secondary | ICD-10-CM | POA: Diagnosis not present

## 2019-02-15 DIAGNOSIS — Z90710 Acquired absence of both cervix and uterus: Secondary | ICD-10-CM

## 2019-02-15 DIAGNOSIS — E875 Hyperkalemia: Secondary | ICD-10-CM | POA: Diagnosis not present

## 2019-02-15 DIAGNOSIS — C20 Malignant neoplasm of rectum: Secondary | ICD-10-CM | POA: Diagnosis present

## 2019-02-15 DIAGNOSIS — J45909 Unspecified asthma, uncomplicated: Secondary | ICD-10-CM | POA: Diagnosis present

## 2019-02-15 DIAGNOSIS — R112 Nausea with vomiting, unspecified: Secondary | ICD-10-CM

## 2019-02-15 DIAGNOSIS — E43 Unspecified severe protein-calorie malnutrition: Secondary | ICD-10-CM | POA: Diagnosis present

## 2019-02-15 DIAGNOSIS — G629 Polyneuropathy, unspecified: Secondary | ICD-10-CM | POA: Diagnosis present

## 2019-02-15 DIAGNOSIS — I4891 Unspecified atrial fibrillation: Secondary | ICD-10-CM | POA: Diagnosis present

## 2019-02-15 DIAGNOSIS — K56609 Unspecified intestinal obstruction, unspecified as to partial versus complete obstruction: Secondary | ICD-10-CM | POA: Diagnosis not present

## 2019-02-15 DIAGNOSIS — Z03818 Encounter for observation for suspected exposure to other biological agents ruled out: Secondary | ICD-10-CM | POA: Diagnosis not present

## 2019-02-15 DIAGNOSIS — D62 Acute posthemorrhagic anemia: Secondary | ICD-10-CM | POA: Diagnosis not present

## 2019-02-15 DIAGNOSIS — N183 Chronic kidney disease, stage 3 unspecified: Secondary | ICD-10-CM | POA: Diagnosis not present

## 2019-02-15 DIAGNOSIS — W1830XD Fall on same level, unspecified, subsequent encounter: Secondary | ICD-10-CM | POA: Diagnosis not present

## 2019-02-15 DIAGNOSIS — I480 Paroxysmal atrial fibrillation: Secondary | ICD-10-CM | POA: Diagnosis not present

## 2019-02-15 LAB — CBC WITH DIFFERENTIAL/PLATELET
Abs Immature Granulocytes: 0.06 10*3/uL (ref 0.00–0.07)
Basophils Absolute: 0 10*3/uL (ref 0.0–0.1)
Basophils Relative: 0 %
Eosinophils Absolute: 0 10*3/uL (ref 0.0–0.5)
Eosinophils Relative: 0 %
HCT: 37.9 % (ref 36.0–46.0)
Hemoglobin: 11.9 g/dL — ABNORMAL LOW (ref 12.0–15.0)
Immature Granulocytes: 0 %
Lymphocytes Relative: 6 %
Lymphs Abs: 1 10*3/uL (ref 0.7–4.0)
MCH: 34 pg (ref 26.0–34.0)
MCHC: 31.4 g/dL (ref 30.0–36.0)
MCV: 108.3 fL — ABNORMAL HIGH (ref 80.0–100.0)
Monocytes Absolute: 0.9 10*3/uL (ref 0.1–1.0)
Monocytes Relative: 6 %
Neutro Abs: 13.6 10*3/uL — ABNORMAL HIGH (ref 1.7–7.7)
Neutrophils Relative %: 88 %
Platelets: 226 10*3/uL (ref 150–400)
RBC: 3.5 MIL/uL — ABNORMAL LOW (ref 3.87–5.11)
RDW: 13.1 % (ref 11.5–15.5)
WBC: 15.6 10*3/uL — ABNORMAL HIGH (ref 4.0–10.5)
nRBC: 0 % (ref 0.0–0.2)

## 2019-02-15 LAB — BASIC METABOLIC PANEL
Anion gap: 9 (ref 5–15)
BUN: 15 mg/dL (ref 8–23)
CO2: 25 mmol/L (ref 22–32)
Calcium: 8.6 mg/dL — ABNORMAL LOW (ref 8.9–10.3)
Chloride: 105 mmol/L (ref 98–111)
Creatinine, Ser: 1.05 mg/dL — ABNORMAL HIGH (ref 0.44–1.00)
GFR calc Af Amer: 53 mL/min — ABNORMAL LOW (ref >60)
GFR calc non Af Amer: 45 mL/min — ABNORMAL LOW (ref >60)
Glucose, Bld: 156 mg/dL — ABNORMAL HIGH (ref 70–99)
Potassium: 4.5 mmol/L (ref 3.5–5.1)
Sodium: 139 mmol/L (ref 135–145)

## 2019-02-15 LAB — TYPE AND SCREEN
ABO/RH(D): A POS
Antibody Screen: NEGATIVE

## 2019-02-15 LAB — PROTIME-INR
INR: 1.1 (ref 0.8–1.2)
Prothrombin Time: 14.3 seconds (ref 11.4–15.2)

## 2019-02-15 LAB — SURGICAL PCR SCREEN
MRSA, PCR: NEGATIVE
Staphylococcus aureus: NEGATIVE

## 2019-02-15 LAB — ABO/RH: ABO/RH(D): A POS

## 2019-02-15 LAB — POC SARS CORONAVIRUS 2 AG -  ED: SARS Coronavirus 2 Ag: NEGATIVE

## 2019-02-15 MED ORDER — BISACODYL 5 MG PO TBEC: 5.0000 mg | DELAYED_RELEASE_TABLET | Freq: Every day | ORAL | Status: DC | PRN

## 2019-02-15 MED ORDER — MORPHINE SULFATE (PF) 2 MG/ML IV SOLN
2.0000 mg | INTRAVENOUS | Status: DC | PRN
  Administered 2019-02-15 – 2019-02-17 (×3): 2 mg via INTRAVENOUS
  Filled 2019-02-15 (×3): qty 1

## 2019-02-15 MED ORDER — METHOCARBAMOL 500 MG PO TABS
500.0000 mg | ORAL_TABLET | Freq: Four times a day (QID) | ORAL | Status: DC | PRN
  Administered 2019-02-17 – 2019-02-19 (×3): 500 mg via ORAL
  Filled 2019-02-15 (×4): qty 1

## 2019-02-15 MED ORDER — GABAPENTIN 300 MG PO CAPS
300.0000 mg | ORAL_CAPSULE | Freq: Every day | ORAL | Status: DC
  Administered 2019-02-15 – 2019-02-18 (×4): 300 mg via ORAL
  Filled 2019-02-15 (×4): qty 1

## 2019-02-15 MED ORDER — PANTOPRAZOLE SODIUM 40 MG PO TBEC
40.0000 mg | DELAYED_RELEASE_TABLET | Freq: Every day | ORAL | Status: DC
  Administered 2019-02-16 – 2019-02-19 (×3): 40 mg via ORAL
  Filled 2019-02-15 (×3): qty 1

## 2019-02-15 MED ORDER — LOPERAMIDE HCL 2 MG PO CAPS: 2.0000 mg | ORAL_CAPSULE | Freq: Four times a day (QID) | ORAL | Status: DC | PRN

## 2019-02-15 MED ORDER — CHLORHEXIDINE GLUCONATE 4 % EX LIQD
60.0000 mL | Freq: Once | CUTANEOUS | Status: AC
  Administered 2019-02-17: 4 via TOPICAL
  Filled 2019-02-15 (×2): qty 60

## 2019-02-15 MED ORDER — ALBUTEROL SULFATE (2.5 MG/3ML) 0.083% IN NEBU: 3.0000 mL | INHALATION_SOLUTION | Freq: Four times a day (QID) | RESPIRATORY_TRACT | Status: DC | PRN

## 2019-02-15 MED ORDER — ATORVASTATIN CALCIUM 10 MG PO TABS
10.0000 mg | ORAL_TABLET | Freq: Every day | ORAL | Status: DC
  Administered 2019-02-16 – 2019-02-19 (×3): 10 mg via ORAL
  Filled 2019-02-15 (×3): qty 1

## 2019-02-15 MED ORDER — TRANEXAMIC ACID-NACL 1000-0.7 MG/100ML-% IV SOLN
1000.0000 mg | INTRAVENOUS | Status: AC
  Administered 2019-02-17: 1000 mg via INTRAVENOUS
  Filled 2019-02-15: qty 100

## 2019-02-15 MED ORDER — POLYETHYLENE GLYCOL 3350 17 G PO PACK: 17.0000 g | PACK | Freq: Every day | ORAL | Status: DC | PRN

## 2019-02-15 MED ORDER — CEFAZOLIN SODIUM-DEXTROSE 2-4 GM/100ML-% IV SOLN
2.0000 g | INTRAVENOUS | Status: AC
  Filled 2019-02-15: qty 100

## 2019-02-15 MED ORDER — TERIPARATIDE (RECOMBINANT) 600 MCG/2.4ML ~~LOC~~ SOLN: 20.0000 ug | Freq: Every day | SUBCUTANEOUS | Status: DC

## 2019-02-15 MED ORDER — HYDROCODONE-ACETAMINOPHEN 5-325 MG PO TABS
1.0000 | ORAL_TABLET | Freq: Four times a day (QID) | ORAL | Status: DC | PRN
  Administered 2019-02-15 – 2019-02-16 (×3): 2 via ORAL
  Administered 2019-02-16 – 2019-02-19 (×6): 1 via ORAL
  Filled 2019-02-15 (×2): qty 2
  Filled 2019-02-15 (×2): qty 1
  Filled 2019-02-15: qty 2
  Filled 2019-02-15 (×5): qty 1

## 2019-02-15 MED ORDER — ENSURE PRE-SURGERY PO LIQD
296.0000 mL | Freq: Once | ORAL | Status: AC
  Administered 2019-02-17: 296 mL via ORAL
  Filled 2019-02-15 (×2): qty 296

## 2019-02-15 MED ORDER — METHOCARBAMOL 1000 MG/10ML IJ SOLN
500.0000 mg | Freq: Four times a day (QID) | INTRAVENOUS | Status: DC | PRN
  Filled 2019-02-15: qty 5

## 2019-02-15 MED ORDER — TRANEXAMIC ACID-NACL 1000-0.7 MG/100ML-% IV SOLN: 1000.0000 mg | INTRAVENOUS | Status: DC

## 2019-02-15 MED ORDER — AMLODIPINE BESYLATE 5 MG PO TABS
5.0000 mg | ORAL_TABLET | Freq: Every day | ORAL | Status: DC
  Administered 2019-02-16: 5 mg via ORAL
  Filled 2019-02-15: qty 1

## 2019-02-15 MED ORDER — TRAMADOL HCL 50 MG PO TABS
50.0000 mg | ORAL_TABLET | Freq: Three times a day (TID) | ORAL | Status: DC | PRN
  Administered 2019-02-15: 50 mg via ORAL
  Filled 2019-02-15: qty 1

## 2019-02-15 MED ORDER — POVIDONE-IODINE 10 % EX SWAB
2.0000 "application " | Freq: Once | CUTANEOUS | Status: AC
  Administered 2019-02-17: 2 via TOPICAL

## 2019-02-15 NOTE — Progress Notes (Addendum)
Consult received for R IT femur fx. Will require surgical stabilization. [place] test pending. Patient takes eliquis; last dose was yesterday pm. Delay surgery 48 hrs due to risk of bleeding from eliquis. Plan for surgery Saturday am. May have diet today. NPO after MN Friday night. Full consult to follow.

## 2019-02-15 NOTE — ED Notes (Signed)
ED TO INPATIENT HANDOFF REPORT  ED Nurse Name and Phone #: [phone]  S Name/Age/Gender Hannah Mercado 85 y.o. female Room/Bed: TRACC/TRACC  Code Status   Code Status: Prior  Home/SNF/Other Home Patient oriented to: self, place, time and situation Is this baseline? Yes   Triage Complete: Triage complete  Chief Complaint Fall  Triage Note Pt from home arrives by EMS with complaints of a fall with right leg injury. Pt not sure if she was sitting down or getting up from the commode when she fell onto her butt. EMS placed pelvic binder. Reports deformity of right leg. Pt alert and oriented X4. On blood thinner Eliquis . Pt endorses 9/10 pain when moving.  EMS gave 100 mcg Fentanyl  138/64 P: 60 98% RA CBG 202   Allergies Allergies  Allergen Reactions  . Sulfonamide Derivatives Nausea And Vomiting    Happened a long time ago, does not remember the other reaction she had.    Level of Care/Admitting Diagnosis ED Disposition    ED Disposition Condition [facility] Area: [place] [[id number]]  Level of Care: Med-Surg [16]  Covid Evaluation: Asymptomatic Screening Protocol (No Symptoms)  Diagnosis: Closed hip fracture, right, initial encounter [facility]) [[id number]]  Admitting Physician: Karmen Bongo [[id number]]  Attending Physician: Karmen Bongo [[id number]]  Estimated length of stay: 3 - 4 days  Certification:: I certify this patient will need inpatient services for at least 2 midnights  PT Class (Do Not Modify): Inpatient [101]  PT Acc Code (Do Not Modify): Private [1]       B Medical/Surgery History Past Medical History:  Diagnosis Date  . Allergy history unknown   . Anxiety   . Asthmatic bronchitis    use inhaler prn   . Atrial fibrillation ([place]) 10/16/2015  . Blood transfusion    ? with ectopic pregnancy 50 yrs ago   . Cardiac pacemaker in situ 02/24/2016  . Carotid artery disease ([place]) 03/12/2015  . Chronic venous  insufficiency 09/11/2015  . Diaphragmatic hernia 07/31/2008   Qualifier: Diagnosis of  By: Lenna Gilford MD, Deborra Medina   . Diverticulosis of colon without hemorrhage 08/02/2012  . DJD (degenerative joint disease)   . Edema 09/11/2015  . GERD (gastroesophageal reflux disease)   . Hiatal hernia   . History of pneumonia    right middle lobe  . Hypercholesteremia   . HYPERCHOLESTEROLEMIA 04/19/2007   Qualifier: Diagnosis of  By: Julien Girt CMA, Marliss Czar    . Hypertension 04/14/2011  . Mitral valve disorder 04/20/2007   Qualifier: Diagnosis of  By: Lenna Gilford MD, Deborra Medina   . Osteopenia   . Persistent atrial fibrillation ([place])   . PULMONARY FIBROSIS, POSTINFLAMMATORY 07/31/2008   Qualifier: Diagnosis of  By: Lenna Gilford MD, Deborra Medina   . Rectal cancer ([place])   . Right carotid bruit 08/28/2014   Past Surgical History:  Procedure Laterality Date  . APPENDECTOMY    . COLON RESECTION  02/08/2011   Procedure: LAPAROSCOPIC SIGMOID COLON RESECTION;  Surgeon: Stark Klein, MD;  Location: [facility];  Service: General;  Laterality: N/A;  Laparoscopic Lower Anterior Bowel Resection  . COLON SURGERY    . COLONOSCOPY  11/27/10   rectal cancer, small sigmoid adenoma, diverticulosis  . ECTOPIC PREGNANCY SURGERY     Midline incision  . EP IMPLANTABLE DEVICE N/A 10/16/2015   Procedure: Pacemaker Implant;  Surgeon: Will Meredith Leeds, MD;  Location: [facility];  Service: Cardiovascular;  Laterality: N/A;  . HAMMER TOE  SURGERY  1995   Dr. Silverio Decamp  . Laparoscopic Left oophorectomy, laparoscopic low anterior resection, placement of OnQ pain pump  02/08/2011  . NASAL SINUS SURGERY  1994  . OVARIAN CYST REMOVAL  02/08/2011   Procedure: OVARIAN CYSTECTOMY;  Surgeon: Stark Klein, MD;  Location: [facility];  Service: General;  Laterality: Left;  Removal of Left Ovary and Pelvic Mass  . VAGINAL HYSTERECTOMY  1980's     A IV Location/Drains/Wounds Patient Lines/Drains/Airways Status   Active Line/Drains/Airways    Name:   Placement  date:   Placement time:   Site:   Days:   Peripheral IV 10/16/15 Right Wrist   10/16/15    0618    Wrist   1218   Peripheral IV 02/15/19 Left Forearm   02/15/19    0934    Forearm   less than 1   Incision (Closed) 02/08/11 Chest Left   02/08/11    1100     2929   Incision - 5 Ports Abdomen 1: Left;Upper 2: Left;Lower 3: Right;Lower 4: Right;Upper 5: Umbilicus   76/14/70    9295     2929          Intake/Output Last 24 hours No intake or output data in the 24 hours ending 02/15/19 1225  Labs/Imaging Results for orders placed or performed during the hospital encounter of 02/15/19 (from the past 48 hour(s))  Basic metabolic panel     Status: Abnormal   Collection Time: 02/15/19  9:45 AM  Result Value Ref Range   Sodium 139 135 - 145 mmol/L   Potassium 4.5 3.5 - 5.1 mmol/L   Chloride 105 98 - 111 mmol/L   CO2 25 22 - 32 mmol/L   Glucose, Bld 156 (H) 70 - 99 mg/dL   BUN 15 8 - 23 mg/dL   Creatinine, Ser 1.05 (H) 0.44 - 1.00 mg/dL   Calcium 8.6 (L) 8.9 - 10.3 mg/dL   GFR calc non Af Amer 45 (L) >60 mL/min   GFR calc Af Amer 53 (L) >60 mL/min   Anion gap 9 5 - 15    Comment: Performed at [facility], [place] [address], [place], [place] [address]  CBC WITH DIFFERENTIAL     Status: Abnormal   Collection Time: 02/15/19  9:45 AM  Result Value Ref Range   WBC 15.6 (H) 4.0 - 10.5 K/uL   RBC 3.50 (L) 3.87 - 5.11 MIL/uL   Hemoglobin 11.9 (L) 12.0 - 15.0 g/dL   HCT 37.9 36.0 - 46.0 %   MCV 108.3 (H) 80.0 - 100.0 fL   MCH 34.0 26.0 - 34.0 pg   MCHC 31.4 30.0 - 36.0 g/dL   RDW 13.1 11.5 - 15.5 %   Platelets 226 150 - 400 K/uL   nRBC 0.0 0.0 - 0.2 %   Neutrophils Relative % 88 %   Neutro Abs 13.6 (H) 1.7 - 7.7 K/uL   Lymphocytes Relative 6 %   Lymphs Abs 1.0 0.7 - 4.0 K/uL   Monocytes Relative 6 %   Monocytes Absolute 0.9 0.1 - 1.0 K/uL   Eosinophils Relative 0 %   Eosinophils Absolute 0.0 0.0 - 0.5 K/uL   Basophils Relative 0 %   Basophils Absolute 0.0 0.0 - 0.1 K/uL   Immature  Granulocytes 0 %   Abs Immature Granulocytes 0.06 0.00 - 0.07 K/uL    Comment: Performed at [facility], [address], [place], [place] [address]  Protime-INR  Status: None   Collection Time: 02/15/19  9:45 AM  Result Value Ref Range   Prothrombin Time 14.3 11.4 - 15.2 seconds   INR 1.1 0.8 - 1.2    Comment: (NOTE) INR goal varies based on device and disease states. Performed at [facility], [place] [address], [place], [place] [address]   Type and screen [place]     Status: None   Collection Time: 02/15/19 10:12 AM  Result Value Ref Range   ABO/RH(D) A POS    Antibody Screen NEG    Sample Expiration      02/18/2019,2359 Performed at [facility], [place] [address], [place], [place] [address]   ABO/Rh     Status: None   Collection Time: 02/15/19 10:12 AM  Result Value Ref Range   ABO/RH(D)      A POS Performed at [facility] [address] [place], [place] [address]   POC SARS Coronavirus 2 Ag-ED - Nasal Swab (BD Veritor Kit)     Status: None   Collection Time: 02/15/19 11:01 AM  Result Value Ref Range   SARS Coronavirus 2 Ag NEGATIVE NEGATIVE    Comment: (NOTE) SARS-CoV-2 antigen NOT DETECTED.  Negative results are presumptive.  Negative results do not preclude SARS-CoV-2 infection and should not be used as the sole basis for treatment or other patient management decisions, including infection  control decisions, particularly in the presence of clinical signs and  symptoms consistent with COVID-19, or in those who have been in contact with the virus.  Negative results must be combined with clinical observations, patient history, and epidemiological information. The expected result is Negative. Fact Sheet for Patients: PodPark.tn Fact Sheet for Healthcare Providers: GiftContent.is This test is not yet approved or cleared by the [place] FDA and  has been  authorized for detection and/or diagnosis of SARS-CoV-2 by FDA under an Emergency Use Authorization (EUA).  This EUA will remain in effect (meaning this test can be used) for the duration of  the COVID-19 de claration under Section 564(b)(1) of the Act, 21 U.S.C. section 360bbb-3(b)(1), unless the authorization is terminated or revoked sooner.    Dg Chest Port 1 View  Result Date: 02/15/2019 CLINICAL DATA:  Fall, hip fracture EXAM: PORTABLE CHEST 1 VIEW COMPARISON:  03/18/2018 FINDINGS: Increased interstitial markings. No focal consolidation. Left lower lobe scarring. No pleural effusion or pneumothorax. Cardiomegaly.  Left subclavian pacemaker. IMPRESSION: No evidence of acute cardiopulmonary disease. Electronically Signed   By: Julian Hy M.D.   On: 02/15/2019 10:29   Dg Knee Right Port  Result Date: 02/15/2019 CLINICAL DATA:  Right leg pain secondary to a fall today. Right proximal femur fracture. EXAM: PORTABLE RIGHT KNEE - 1-2 VIEW COMPARISON:  None. FINDINGS: There is no fracture or dislocation. Slight arthritic changes in the medial and patellofemoral compartments. Extensive arterial calcification. IMPRESSION: No acute abnormality. Slight arthritic changes in the medial and patellofemoral compartments. Electronically Signed   By: Lorriane Shire M.D.   On: 02/15/2019 11:43   Dg Hip Port Unilat W Or Wo Pelvis 1 View Right  Result Date: 02/15/2019 CLINICAL DATA:  Fall, right hip deformity EXAM: DG HIP (WITH OR WITHOUT PELVIS) 1V PORT RIGHT COMPARISON:  None. FINDINGS: Comminuted intertrochanteric right hip fracture. Varus angulation and foreshortening. Displaced lesser trochanteric fragment. Left hip is intact.  Bilateral hip joint spaces are preserved. Visualized bony pelvis appears intact. IMPRESSION: Comminuted intertrochanteric right hip fracture, as above. Electronically Signed  By: Julian Hy M.D.   On: 02/15/2019 10:28    Pending Labs Unresulted Labs (From  admission, onward)   None      Vitals/Pain Today's Vitals   02/15/19 0935 02/15/19 1221  BP:  (!) 121/96  Pulse:  63  SpO2:  100%  Weight: 56.7 kg   Height: 5' 7"  (1.702 m)   PainSc: 0-No pain 10-Worst pain ever    Isolation Precautions Airborne and Contact precautions  Medications Medications  feeding supplement (ENSURE PRE-SURGERY) liquid 296 mL (has no administration in time range)  chlorhexidine (HIBICLENS) 4 % liquid 4 application (has no administration in time range)  povidone-iodine 10 % swab 2 application (has no administration in time range)  ceFAZolin (ANCEF) IVPB 2g/100 mL premix (has no administration in time range)  tranexamic acid (CYKLOKAPRON) IVPB 1,000 mg (has no administration in time range)  morphine 2 MG/ML injection 2 mg (2 mg Intravenous Given 02/15/19 1218)    Mobility walks High fall risk   Focused Assessments    R Recommendations: See Admitting Provider Note  Report given to:   Additional Notes:

## 2019-02-15 NOTE — ED Provider Notes (Signed)
[place] EMERGENCY DEPARTMENT Provider Note   CSN: [id number] Arrival date & time: 02/15/19  9485     History   Chief Complaint Chief Complaint  Patient presents with  . Fall  . Leg Injury    HPI Hannah Mercado is a 85 y.o. female.     HPI Patient presents after mechanical fall. Patient is elderly, but generally well, continues to drive her own automobile, and was in her usual state of health. She notes that she has substantial neuropathy. Today, while on the commode, she slid from it, sustaining injury to her right hip. Since that time she has had severe pain in her right hip, transiently better with fentanyl provided by EMS providers. She denies other traumatic injuries, including head, shoulder, neck effects. She has no pain anywhere other than her right hip, which is worse with motion, palpation. No distal new loss of sensation or weakness.  Past Medical History:  Diagnosis Date  . Allergy history unknown   . Anxiety   . Asthmatic bronchitis    use inhaler prn   . Atrial fibrillation ([place]) 10/16/2015  . Blood transfusion    ? with ectopic pregnancy 50 yrs ago   . Cardiac pacemaker in situ 02/24/2016  . Carotid artery disease ([place]) 03/12/2015  . Chronic venous insufficiency 09/11/2015  . Diaphragmatic hernia 07/31/2008   Qualifier: Diagnosis of  By: Lenna Gilford MD, Deborra Medina   . Diverticulosis of colon without hemorrhage 08/02/2012  . DJD (degenerative joint disease)   . Edema 09/11/2015  . GERD (gastroesophageal reflux disease)   . Hiatal hernia   . History of pneumonia    right middle lobe  . Hypercholesteremia   . HYPERCHOLESTEROLEMIA 04/19/2007   Qualifier: Diagnosis of  By: Julien Girt CMA, Marliss Czar    . Hypertension 04/14/2011  . Mitral valve disorder 04/20/2007   Qualifier: Diagnosis of  By: Lenna Gilford MD, Deborra Medina   . Mitral valve prolapse    pt denies   . Osteopenia   . Persistent atrial fibrillation ([place])   . PULMONARY FIBROSIS,  POSTINFLAMMATORY 07/31/2008   Qualifier: Diagnosis of  By: Lenna Gilford MD, Deborra Medina   . Pulmonary fibrosis, postinflammatory ([place])    pt denies   . Rectal cancer ([place])   . Right carotid bruit 08/28/2014    Patient Active Problem List   Diagnosis Date Noted  . Back pain 02/21/2018  . Compression fracture of lumbar vertebra ([place]) 02/21/2018  . Osteoporosis 02/21/2018  . Pulmonary hypertension ([place]) 02/20/2018  . Cardiac pacemaker in situ 02/24/2016  . Atrial fibrillation ([place]) 10/16/2015  . Persistent atrial fibrillation ([place])   . Chronic venous insufficiency 09/11/2015  . Edema 09/11/2015  . Carotid artery disease ([place]) 03/12/2015  . Diverticulosis of colon without hemorrhage 08/02/2012  . UTI (lower urinary tract infection) 02/03/2012  . Hypertension 04/14/2011  . Ovarian cyst, left 12/29/2010  . pT2pN0 (0/12 lymph nodes) cM0 Rectal Cancer 11/27/2010  . Mononeuritis 07/31/2008  . PULMONARY FIBROSIS, POSTINFLAMMATORY 07/31/2008  . Diaphragmatic hernia 07/31/2008  . Mitral valve disorder 04/20/2007  . HYPERCHOLESTEROLEMIA 04/19/2007  . Anxiety state 04/19/2007  . GERD 04/19/2007  . Osteoarthritis 04/19/2007  . ALLERGY 04/19/2007    Past Surgical History:  Procedure Laterality Date  . APPENDECTOMY    . COLON RESECTION  02/08/2011   Procedure: LAPAROSCOPIC SIGMOID COLON RESECTION;  Surgeon: Stark Klein, MD;  Location: [facility];  Service: General;  Laterality: N/A;  Laparoscopic Lower Anterior Bowel Resection  . COLON SURGERY    .  COLONOSCOPY  11/27/10   rectal cancer, small sigmoid adenoma, diverticulosis  . ECTOPIC PREGNANCY SURGERY     Midline incision  . EP IMPLANTABLE DEVICE N/A 10/16/2015   Procedure: Pacemaker Implant;  Surgeon: Will Meredith Leeds, MD;  Location: [facility];  Service: Cardiovascular;  Laterality: N/A;  . HAMMER TOE SURGERY  1995   Dr. Silverio Decamp  . Laparoscopic Left oophorectomy, laparoscopic low anterior resection, placement of OnQ pain pump   02/08/2011  . NASAL SINUS SURGERY  1994  . OVARIAN CYST REMOVAL  02/08/2011   Procedure: OVARIAN CYSTECTOMY;  Surgeon: Stark Klein, MD;  Location: [facility];  Service: General;  Laterality: Left;  Removal of Left Ovary and Pelvic Mass  . VAGINAL HYSTERECTOMY  1980's     OB History   No obstetric history on file.      Home Medications    Prior to Admission medications   Medication Sig Start Date End Date Taking? Authorizing Provider  albuterol (PROVENTIL HFA;VENTOLIN HFA) 108 (90 BASE) MCG/ACT inhaler Inhale 2 puffs into the lungs every 6 (six) hours as needed. Wheezing  10/09/10   Noralee Space, MD  ALENDRONATE SODIUM PO Take 75 mg by mouth once a week.    [provider]  amLODipine (NORVASC) 5 MG tablet TAKE 1 TABLET BY MOUTH EVERY DAY 08/31/18   Noralee Space, MD  apixaban (ELIQUIS) 2.5 MG TABS tablet Take 1 tablet (2.5 mg total) by mouth 2 (two) times daily. 10/11/18   Camnitz, Ocie Doyne, MD  ascorbic acid (VITAMIN C) 250 MG CHEW Chew 250 mg by mouth daily.     [provider]  atorvastatin (LIPITOR) 20 MG tablet Take 0.5 tablets (10 mg total) by mouth daily. 09/08/17   Noralee Space, MD  Calcium Carbonate-Vitamin D (CALCIUM-VITAMIN D) 500-200 MG-UNIT per tablet Take 2 tablets by mouth daily.     [provider]  Cholecalciferol (VITAMIN D3) 1000 UNITS CAPS Take 1 capsule by mouth daily.     [provider]  ferrous sulfate 325 (65 FE) MG tablet Take 325 mg by mouth daily with breakfast.    [provider]  furosemide (LASIX) 20 MG tablet TAKE 1 TABLET BY MOUTH AS NEEDED FOR SWELLING 09/13/18   Noralee Space, MD  gabapentin (NEURONTIN) 100 MG capsule Take 1-3 tabs as directed at bedtime 08/31/17   Noralee Space, MD  loperamide (LOPERAMIDE A-D) 2 MG tablet Take 2 mg by mouth every 6 (six) hours as needed.    [provider]  omeprazole (PRILOSEC) 20 MG capsule Take 20 mg by mouth daily.     [provider]  Teriparatide,  Recombinant, (FORTEO) 600 MCG/2.4ML SOLN Inject 0.08 mLs (20 mcg total) into the skin daily. 03/27/18   Noralee Space, MD  traMADol (ULTRAM) 50 MG tablet Take 1 tablet (50 mg total) by mouth 3 (three) times daily as needed for moderate pain. 03/02/18   Noralee Space, MD  vitamin B-12 (CYANOCOBALAMIN) 250 MCG tablet Take 250 mcg by mouth daily.     [provider]  vitamin E (VITAMIN E) 200 UNIT capsule Take 200 Units by mouth daily.     [provider]    Family History Family History  Problem Relation Age of Onset  . Heart disease Father   . Lung cancer Brother        x 2  . Colon cancer Neg Hx     Social History Social History   Tobacco  Use  . Smoking status: Never Smoker  . Smokeless tobacco: Never Used  Substance Use Topics  . Alcohol use: Yes  . Drug use: No     Allergies   Sulfonamide derivatives   Review of Systems Review of Systems  Constitutional:       Per HPI, otherwise negative  HENT:       Per HPI, otherwise negative  Respiratory:       Per HPI, otherwise negative  Cardiovascular:       Per HPI, otherwise negative  Gastrointestinal: Negative for vomiting.  Endocrine:       Negative aside from HPI  Genitourinary:       Neg aside from HPI   Musculoskeletal:       Per HPI, otherwise negative  Skin: Negative.   Neurological: Negative for syncope.     Physical Exam Updated Vital Signs Ht 5\' 7"  (1.702 m)   Wt 56.7 kg   BMI 19.58 kg/m   Physical Exam Vitals signs and nursing note reviewed.  Constitutional:      General: She is not in acute distress.    Appearance: She is well-developed.  HENT:     Head: Normocephalic and atraumatic.  Eyes:     Conjunctiva/sclera: Conjunctivae normal.  Cardiovascular:     Rate and Rhythm: Normal rate and regular rhythm.  Pulmonary:     Effort: Pulmonary effort is normal. No respiratory distress.     Breath sounds: Normal breath sounds. No stridor.  Abdominal:     General: There is no  distension.  Musculoskeletal:     Right hip: She exhibits decreased range of motion, tenderness and bony tenderness.     Right knee: Normal.     Right ankle: Normal.       Legs:  Skin:    General: Skin is warm and dry.  Neurological:     Mental Status: She is alert and oriented to person, place, and time.     Cranial Nerves: No cranial nerve deficit.      ED Treatments / Results  Labs (all labs ordered are listed, but only abnormal results are displayed) Labs Reviewed  BASIC METABOLIC PANEL - Abnormal; Notable for the following components:      Result Value   Glucose, Bld 156 (*)    Creatinine, Ser 1.05 (*)    Calcium 8.6 (*)    GFR calc non Af Amer 45 (*)    GFR calc Af Amer 53 (*)    All other components within normal limits  CBC WITH DIFFERENTIAL/PLATELET - Abnormal; Notable for the following components:   WBC 15.6 (*)    RBC 3.50 (*)    Hemoglobin 11.9 (*)    MCV 108.3 (*)    Neutro Abs 13.6 (*)    All other components within normal limits  PROTIME-INR  POC SARS CORONAVIRUS 2 AG -  ED  TYPE AND SCREEN    EKG Ventricular paced rhythm, 62, abnormal  Radiology Dg Chest Port 1 View  Result Date: 02/15/2019 CLINICAL DATA:  Fall, hip fracture EXAM: PORTABLE CHEST 1 VIEW COMPARISON:  03/18/2018 FINDINGS: Increased interstitial markings. No focal consolidation. Left lower lobe scarring. No pleural effusion or pneumothorax. Cardiomegaly.  Left subclavian pacemaker. IMPRESSION: No evidence of acute cardiopulmonary disease. Electronically Signed   By: Julian Hy M.D.   On: 02/15/2019 10:29   Dg Hip Port Unilat W Or Wo Pelvis 1 View Right  Result Date: 02/15/2019 CLINICAL DATA:  Fall, right hip deformity  EXAM: DG HIP (WITH OR WITHOUT PELVIS) 1V PORT RIGHT COMPARISON:  None. FINDINGS: Comminuted intertrochanteric right hip fracture. Varus angulation and foreshortening. Displaced lesser trochanteric fragment. Left hip is intact.  Bilateral hip joint spaces are  preserved. Visualized bony pelvis appears intact. IMPRESSION: Comminuted intertrochanteric right hip fracture, as above. Electronically Signed   By: Julian Hy M.D.   On: 02/15/2019 10:28    Procedures Procedures (including critical care time)  Medications Ordered in ED Medications - No data to display   Initial Impression / Assessment and Plan / ED Course  I have reviewed the triage vital signs and the nursing notes.  Pertinent labs & imaging results that were available during my care of the patient were reviewed by me and considered in my medical decision making (see chart for details).        10:45 AM Patient in similar condition. X-rays consistent with hip fracture. I discussed the patient's presentation with her orthopedic physician on-call.  Given her use of Eliquis yesterday, the patient is not a candidate for operative repair today, but will require admission for planned procedure tomorrow. Patient's initial labs otherwise unremarkable, vitals unremarkable, and she denies any other complaints.  Final Clinical Impressions(s) / ED Diagnoses   Final diagnoses:  Displaced intertrochanteric fracture of right femur [facility])      Carmin Muskrat, MD 02/15/19 1332

## 2019-02-15 NOTE — Plan of Care (Signed)
  Problem: Clinical Measurements: ?Goal: Cardiovascular complication will be avoided ?Outcome: Progressing ?  ?Problem: Elimination: ?Goal: Will not experience complications related to urinary retention ?Outcome: Progressing ?  ?Problem: Pain Managment: ?Goal: General experience of comfort will improve ?Outcome: Progressing ?  ?Problem: Safety: ?Goal: Ability to remain free from injury will improve ?Outcome: Progressing ?  ?

## 2019-02-15 NOTE — Plan of Care (Signed)

## 2019-02-15 NOTE — H&P (Signed)
History and Physical    Hannah Mercado DGL:[id number] DOB: 12-18-1922 DOA: 02/15/2019  PCP: Patient, No Pcp Per Consultants:  [facility] - pulmonology; [facility] - cardiology Patient coming from:  Home - lives alone; [place]: Son, [phone]  Chief Complaint: Fall  HPI: Hannah Mercado is a 85 y.o. female with medical history significant of rectal cancer; afib; HTN; HLD: CAD; pulmonary fibrosis; pacemaker placement; and afib on Eliquis presenting with a fall.  She reports that she got up early this morning to go to the bathroom.  She has neuropathy and when she went to stand up her leg was numb and it caused her to drop to the floor.  She landed directly on her right hip and dragged herself back to her bedroom to call for help.   ED Course: Hip fracture - mechanical fall off commode.  Dr. Lyla Glassing with operate tomorrow due to Eliquis.  Review of Systems: As per HPI; otherwise review of systems reviewed and negative.   Ambulatory Status:  Ambulates without assistance  Past Medical History:  Diagnosis Date  . Allergy history unknown   . Anxiety   . Asthmatic bronchitis    use inhaler prn   . Atrial fibrillation ([place]) 10/16/2015  . Blood transfusion    ? with ectopic pregnancy 50 yrs ago   . Cardiac pacemaker in situ 02/24/2016  . Carotid artery disease ([place]) 03/12/2015  . Chronic venous insufficiency 09/11/2015  . Diaphragmatic hernia 07/31/2008   Qualifier: Diagnosis of  By: Lenna Gilford MD, Deborra Medina   . Diverticulosis of colon without hemorrhage 08/02/2012  . DJD (degenerative joint disease)   . Edema 09/11/2015  . GERD (gastroesophageal reflux disease)   . Hiatal hernia   . History of pneumonia    right middle lobe  . Hypercholesteremia   . HYPERCHOLESTEROLEMIA 04/19/2007   Qualifier: Diagnosis of  By: Julien Girt CMA, Marliss Czar    . Hypertension 04/14/2011  . Mitral valve disorder 04/20/2007   Qualifier: Diagnosis of  By: Lenna Gilford MD, Deborra Medina   . Osteopenia   . Persistent atrial  fibrillation ([place])   . PULMONARY FIBROSIS, POSTINFLAMMATORY 07/31/2008   Qualifier: Diagnosis of  By: Lenna Gilford MD, Deborra Medina   . Rectal cancer ([place])   . Right carotid bruit 08/28/2014    Past Surgical History:  Procedure Laterality Date  . APPENDECTOMY    . COLON RESECTION  02/08/2011   Procedure: LAPAROSCOPIC SIGMOID COLON RESECTION;  Surgeon: Stark Klein, MD;  Location: [facility];  Service: General;  Laterality: N/A;  Laparoscopic Lower Anterior Bowel Resection  . COLON SURGERY    . COLONOSCOPY  11/27/10   rectal cancer, small sigmoid adenoma, diverticulosis  . ECTOPIC PREGNANCY SURGERY     Midline incision  . EP IMPLANTABLE DEVICE N/A 10/16/2015   Procedure: Pacemaker Implant;  Surgeon: Will Meredith Leeds, MD;  Location: [place] [facility];  Service: Cardiovascular;  Laterality: N/A;  . HAMMER TOE SURGERY  1995   Dr. Silverio Decamp  . Laparoscopic Left oophorectomy, laparoscopic low anterior resection, placement of OnQ pain pump  02/08/2011  . NASAL SINUS SURGERY  1994  . OVARIAN CYST REMOVAL  02/08/2011   Procedure: OVARIAN CYSTECTOMY;  Surgeon: Stark Klein, MD;  Location: [facility];  Service: General;  Laterality: Left;  Removal of Left Ovary and Pelvic Mass  . VAGINAL HYSTERECTOMY  1980's    Social History   Socioeconomic History  . Marital status: Widowed    Spouse name: Not on file  . Number of children: 2  .  Years of education: Not on file  . Highest education level: Not on file  Occupational History  . Occupation: housewife    Employer: RETIRED  Social Needs  . Financial resource strain: Not on file  . Food insecurity    Worry: Not on file    Inability: Not on file  . Transportation needs    Medical: Not on file    Non-medical: Not on file  Tobacco Use  . Smoking status: Never Smoker  . Smokeless tobacco: Never Used  Substance and Sexual Activity  . Alcohol use: Yes  . Drug use: No  . Sexual activity: Not on file  Lifestyle  . Physical activity    Days per week: Not  on file    Minutes per session: Not on file  . Stress: Not on file  Relationships  . Social Herbalist on phone: Not on file    Gets together: Not on file    Attends religious service: Not on file    Active member of club or organization: Not on file    Attends meetings of clubs or organizations: Not on file    Relationship status: Not on file  . Intimate partner violence    Fear of current or ex partner: Not on file    Emotionally abused: Not on file    Physically abused: Not on file    Forced sexual activity: Not on file  Other Topics Concern  . Not on file  Social History Narrative  . Not on file    Allergies  Allergen Reactions  . Sulfonamide Derivatives Nausea And Vomiting    Happened a long time ago, does not remember the other reaction she had.    Family History  Problem Relation Age of Onset  . Heart disease Father   . Lung cancer Brother        x 2  . Colon cancer Neg Hx     Prior to Admission medications   Medication Sig Start Date End Date Taking? Authorizing Provider  albuterol (PROVENTIL HFA;VENTOLIN HFA) 108 (90 BASE) MCG/ACT inhaler Inhale 2 puffs into the lungs every 6 (six) hours as needed. Wheezing  10/09/10   Noralee Space, MD  ALENDRONATE SODIUM PO Take 75 mg by mouth once a week.    [provider]  amLODipine (NORVASC) 5 MG tablet TAKE 1 TABLET BY MOUTH EVERY DAY 08/31/18   Noralee Space, MD  apixaban (ELIQUIS) 2.5 MG TABS tablet Take 1 tablet (2.5 mg total) by mouth 2 (two) times daily. 10/11/18   Camnitz, Ocie Doyne, MD  ascorbic acid (VITAMIN C) 250 MG CHEW Chew 250 mg by mouth daily.     [provider]  atorvastatin (LIPITOR) 20 MG tablet Take 0.5 tablets (10 mg total) by mouth daily. 09/08/17   Noralee Space, MD  Calcium Carbonate-Vitamin D (CALCIUM-VITAMIN D) 500-200 MG-UNIT per tablet Take 2 tablets by mouth daily.     [provider]  Cholecalciferol (VITAMIN D3) 1000 UNITS CAPS Take 1 capsule by mouth  daily.     [provider]  ferrous sulfate 325 (65 FE) MG tablet Take 325 mg by mouth daily with breakfast.    [provider]  furosemide (LASIX) 20 MG tablet TAKE 1 TABLET BY MOUTH AS NEEDED FOR SWELLING 09/13/18   Noralee Space, MD  gabapentin (NEURONTIN) 100 MG capsule Take 1-3 tabs as directed at bedtime 08/31/17   Noralee Space, MD  loperamide (LOPERAMIDE  A-D) 2 MG tablet Take 2 mg by mouth every 6 (six) hours as needed.    [provider]  omeprazole (PRILOSEC) 20 MG capsule Take 20 mg by mouth daily.     [provider]  Teriparatide, Recombinant, (FORTEO) 600 MCG/2.4ML SOLN Inject 0.08 mLs (20 mcg total) into the skin daily. 03/27/18   Noralee Space, MD  traMADol (ULTRAM) 50 MG tablet Take 1 tablet (50 mg total) by mouth 3 (three) times daily as needed for moderate pain. 03/02/18   Noralee Space, MD  vitamin B-12 (CYANOCOBALAMIN) 250 MCG tablet Take 250 mcg by mouth daily.     [provider]  vitamin E (VITAMIN E) 200 UNIT capsule Take 200 Units by mouth daily.     [provider]    Physical Exam: Vitals:   02/15/19 0935 02/15/19 1221 02/15/19 1409  BP:  (!) 121/96 (!) 142/70  Pulse:  63 60  Resp:   14  Temp:   98.5 F (36.9 C)  TempSrc:   Oral  SpO2:  100% 100%  Weight: 56.7 kg    Height: 5\' 7"  (1.702 m)       . General:  Appears calm and comfortable and is NAD . Eyes:  PERRL, EOMI, normal lids, iris . ENT:  grossly normal hearing, lips & tongue, mmm . Neck:  no LAD, masses or thyromegaly . Cardiovascular:  RRR, no m/r/g. No LE edema.  Marland Kitchen Respiratory:   CTA bilaterally with no wheezes/rales/rhonchi.  Normal respiratory effort. . Abdomen:  soft, NT, ND, NABS . Skin:  no rash or induration seen on limited exam . Musculoskeletal:  R leg shortening with external rotation. . Lower extremity:  No LE edema.  Limited foot exam with no ulcerations.  2+ distal pulses. Marland Kitchen Psychiatric:  grossly normal mood and affect, speech  fluent and appropriate, AOx3 . Neurologic:  CN 2-12 grossly intact, moves all extremities in coordinated fashion, sensation intact    Radiological Exams on Admission: Dg Chest Port 1 View  Result Date: 02/15/2019 CLINICAL DATA:  Fall, hip fracture EXAM: PORTABLE CHEST 1 VIEW COMPARISON:  03/18/2018 FINDINGS: Increased interstitial markings. No focal consolidation. Left lower lobe scarring. No pleural effusion or pneumothorax. Cardiomegaly.  Left subclavian pacemaker. IMPRESSION: No evidence of acute cardiopulmonary disease. Electronically Signed   By: Julian Hy M.D.   On: 02/15/2019 10:29   Dg Knee Right Port  Result Date: 02/15/2019 CLINICAL DATA:  Right leg pain secondary to a fall today. Right proximal femur fracture. EXAM: PORTABLE RIGHT KNEE - 1-2 VIEW COMPARISON:  None. FINDINGS: There is no fracture or dislocation. Slight arthritic changes in the medial and patellofemoral compartments. Extensive arterial calcification. IMPRESSION: No acute abnormality. Slight arthritic changes in the medial and patellofemoral compartments. Electronically Signed   By: Lorriane Shire M.D.   On: 02/15/2019 11:43   Dg Hip Port Unilat W Or Wo Pelvis 1 View Right  Result Date: 02/15/2019 CLINICAL DATA:  Fall, right hip deformity EXAM: DG HIP (WITH OR WITHOUT PELVIS) 1V PORT RIGHT COMPARISON:  None. FINDINGS: Comminuted intertrochanteric right hip fracture. Varus angulation and foreshortening. Displaced lesser trochanteric fragment. Left hip is intact.  Bilateral hip joint spaces are preserved. Visualized bony pelvis appears intact. IMPRESSION: Comminuted intertrochanteric right hip fracture, as above. Electronically Signed   By: Julian Hy M.D.   On: 02/15/2019 10:28    EKG: Independently reviewed.  Junctional rhythm with rate 62; LVH with IVCD with nonspecific ST changes    Labs  on Admission: I have personally reviewed the available labs and imaging studies at the time of the admission.   Pertinent labs:   Glucose 156 BUN 25/Creatinine 1.05/GFR 45 - stable WBC 15.6 Hgb 11.9   Assessment/Plan Principal Problem:   Closed hip fracture, right, initial encounter (HCC) Active Problems:   HYPERCHOLESTEROLEMIA   PULMONARY FIBROSIS, POSTINFLAMMATORY   pT2pN0 (0/12 lymph nodes) cM0 Rectal Cancer   Hypertension   Carotid artery disease (HCC)   Persistent atrial fibrillation (HCC)   Cardiac pacemaker in situ   Stage 3a chronic kidney disease   Hip fracture -Mechanical fall resulting in hip fracture -Orthopedics consult  -NPO after midnight tomorrow in anticipation of surgical repair Saturday to allow for Eliquis washout -SCDs overnight, start Lovenox post-operatively (or as per ortho) -CXR and EKG prior to surgery if not done in ER -Pain control with Robxain, Vicodin, and Morphine prn -SW consult for rehab placement post-operatively if needed -Will need PT consult post-operatively -Hip fracture order set utilized  Osteoporosis -She was previously on teriparatide but currently appears to be taking Fosamax -Hip fracture during bisphosphonate indicates that this may be insufficient -Needs outpatient f/u regarding this issue for consideration of options such as Denosumab  CAD/afib -Pacemaker in place -Holding Eliquis for surgery  Pulmonary fibrosis/pulmonary HTN -Followed now by Dr. Valeta Harms with recent visit on 11/4 -Continue Albuterol prn  HTN -Continue Norvasc  HLD -Continue Lipitor  Stage 3a CKD -Appears to be stable at this time Note: This patient has been tested and is negative for the novel coronavirus COVID-19.    DVT prophylaxis:  SCDs until approved for Lovenox by orthopedics Code Status:  DNR - confirmed with patient/family Family Communication: Son was present throughout evaluation Disposition Plan:  Home once clinically improved Consults called: Orthopedics; Nutrition; will need PT and SW post-operatively  Admission status: Admit - It is my  clinical opinion that admission to INPATIENT is reasonable and necessary because of the expectation that this patient will require hospital care that crosses at least 2 midnights to treat this condition based on the medical complexity of the problems presented.  Given the aforementioned information, the predictability of an adverse outcome is felt to be significant.     Karmen Bongo MD [facility]   How to contact the [facility] Attending or Consulting provider [place] or covering provider during after hours [place], for this patient?  1. Check the care team in [facility] and look for a) attending/consulting TR[facility] provider listed and b) the [facility] team listed 2. Log into [email or web address] and use [place]'s universal password to access. If you do not have the password, please contact the hospital operator. 3. Locate the [facility] provider you are looking for under [facility] and page to a number that you can be directly reached. 4. If you still have difficulty reaching the provider, please page the [facility] (Director on Call) for the Hospitalists listed on amion for assistance.   02/15/2019, 3:14 PM

## 2019-02-15 NOTE — ED Triage Notes (Signed)
Pt from home arrives by EMS with complaints of a fall with right leg injury. Pt not sure if she was sitting down or getting up from the commode when she fell onto her butt. EMS placed pelvic binder. Reports deformity of right leg. Pt alert and oriented X4. On blood thinner Eliquis . Pt endorses 9/10 pain when moving.  EMS gave 100 mcg Fentanyl  138/64 P: 60 98% RA CBG 202

## 2019-02-16 ENCOUNTER — Encounter (HOSPITAL_COMMUNITY): Payer: Self-pay | Admitting: Internal Medicine

## 2019-02-16 DIAGNOSIS — N1831 Chronic kidney disease, stage 3a: Secondary | ICD-10-CM

## 2019-02-16 DIAGNOSIS — E43 Unspecified severe protein-calorie malnutrition: Secondary | ICD-10-CM | POA: Insufficient documentation

## 2019-02-16 DIAGNOSIS — I10 Essential (primary) hypertension: Secondary | ICD-10-CM

## 2019-02-16 DIAGNOSIS — I480 Paroxysmal atrial fibrillation: Secondary | ICD-10-CM

## 2019-02-16 LAB — BASIC METABOLIC PANEL
Anion gap: 10 (ref 5–15)
BUN: 20 mg/dL (ref 8–23)
CO2: 26 mmol/L (ref 22–32)
Calcium: 8.3 mg/dL — ABNORMAL LOW (ref 8.9–10.3)
Chloride: 104 mmol/L (ref 98–111)
Creatinine, Ser: 1.08 mg/dL — ABNORMAL HIGH (ref 0.44–1.00)
GFR calc Af Amer: 51 mL/min — ABNORMAL LOW (ref >60)
GFR calc non Af Amer: 44 mL/min — ABNORMAL LOW (ref >60)
Glucose, Bld: 107 mg/dL — ABNORMAL HIGH (ref 70–99)
Potassium: 4.4 mmol/L (ref 3.5–5.1)
Sodium: 140 mmol/L (ref 135–145)

## 2019-02-16 LAB — CBC
HCT: 30.9 % — ABNORMAL LOW (ref 36.0–46.0)
Hemoglobin: 9.8 g/dL — ABNORMAL LOW (ref 12.0–15.0)
MCH: 33.8 pg (ref 26.0–34.0)
MCHC: 31.7 g/dL (ref 30.0–36.0)
MCV: 106.6 fL — ABNORMAL HIGH (ref 80.0–100.0)
Platelets: 175 10*3/uL (ref 150–400)
RBC: 2.9 MIL/uL — ABNORMAL LOW (ref 3.87–5.11)
RDW: 13.2 % (ref 11.5–15.5)
WBC: 6.9 10*3/uL (ref 4.0–10.5)
nRBC: 0 % (ref 0.0–0.2)

## 2019-02-16 MED ORDER — ADULT MULTIVITAMIN W/MINERALS CH
1.0000 | ORAL_TABLET | Freq: Every day | ORAL | Status: DC
  Administered 2019-02-16 – 2019-02-19 (×3): 1 via ORAL
  Filled 2019-02-16 (×3): qty 1

## 2019-02-16 MED ORDER — SODIUM CHLORIDE 0.9 % IV SOLN
INTRAVENOUS | Status: DC | PRN
  Administered 2019-02-16: 09:00:00 via INTRAVENOUS

## 2019-02-16 MED ORDER — ENSURE ENLIVE PO LIQD
237.0000 mL | Freq: Two times a day (BID) | ORAL | Status: DC
  Administered 2019-02-16 – 2019-02-19 (×5): 237 mL via ORAL

## 2019-02-16 NOTE — Progress Notes (Signed)
Initial Nutrition Assessment  DOCUMENTATION CODES:   Severe malnutrition in context of chronic illness  INTERVENTION:   -Magic cup TID with meals, each supplement provides 290 kcal and 9 grams of protein -Ensure Enlive po BID, each supplement provides 350 kcal and 20 grams of protein -MVI with minerals daily  NUTRITION DIAGNOSIS:   Severe Malnutrition related to chronic illness(CAD, pulmonary fibrosis) as evidenced by severe muscle depletion, severe fat depletion.  GOAL:   Patient will meet greater than or equal to 90% of their needs  MONITOR:   PO intake, Supplement acceptance, Labs, Weight trends, Skin, I & O's  REASON FOR ASSESSMENT:   Consult Assessment of nutrition requirement/status  ASSESSMENT:   Hannah Mercado is a 85 y.o. female with medical history significant of rectal cancer; afib; HTN; HLD: CAD; pulmonary fibrosis; pacemaker placement; and afib on Eliquis presenting with a fall.  She reports that she got up early this morning to go to the bathroom.  She has neuropathy and when she went to stand up her leg was numb and it caused her to drop to the floor.  She landed directly on her right hip and dragged herself back to her bedroom to call for help.  Pt admitted with rt hip fracture.   Reviewed I/O's: +330 ml x 24 hours  UOP: 150 ml x 24 hours  Per orthopedics notes, plan for surgery tomorrow.   Spoke with pt at bedside, who reports a general decline in health over the past year, secondary to back trouble. Pt elected to treat her back trouble conservatively, however, intake was limited due to pain. Per pt, she usually weighs around 140# and lost down to 96# within a 2 month period. Due to poor nutritional status, pt reports she went to live with her daughter for 3 months in [place] and returned home just prior to the stop of the COVID-19 pandemic. Since the pandemic, pt has been maintaining wt of around 120-125#. She consumes 2 meals per day (Breakfast: eggs and  toast and Lunch: frozen dinner or egg salad sandwich). She occasionally receives food from neighbors (pt still lives alone and drives).   Observed breakfast tray- pt reports she consumed only bites due to pain.  Discussed importance of good meal and supplement intake to promote healing. Pt amenable to consume Ensure supplements, as she consumed these in the past.   Labs reviewed.   NUTRITION - FOCUSED PHYSICAL EXAM:    Most Recent Value  Orbital Region  Severe depletion  Upper Arm Region  Severe depletion  Thoracic and Lumbar Region  Moderate depletion  Buccal Region  Severe depletion  Temple Region  Severe depletion  Clavicle Bone Region  Severe depletion  Clavicle and Acromion Bone Region  Severe depletion  Scapular Bone Region  Severe depletion  Dorsal Hand  Moderate depletion  Patellar Region  Mild depletion  Anterior Thigh Region  Mild depletion  Posterior Calf Region  Mild depletion  Edema (RD Assessment)  None  Hair  Reviewed  Eyes  Reviewed  Mouth  Reviewed  Skin  Reviewed  Nails  Reviewed       Diet Order:   Diet Order            Diet NPO time specified  Diet effective midnight        Diet regular Room service appropriate? Yes; Fluid consistency: Thin  Diet effective now              EDUCATION NEEDS:   Education  needs have been addressed  Skin:  Skin Assessment: Reviewed RN Assessment  Last BM:  02/15/19  Height:   Ht Readings from Last 1 Encounters:  02/15/19 5\' 7"  (1.702 m)    Weight:   Wt Readings from Last 1 Encounters:  02/15/19 56.7 kg    Ideal Body Weight:  61.4 kg  BMI:  Body mass index is 19.58 kg/m.  Estimated Nutritional Needs:   Kcal:  1500-1700  Protein:  70-85 grams  Fluid:  > 1.5 L    Alexsandria Kivett A. [name], RD, LDN, [place] Registered Dietitian II Certified Diabetes Care and Education Specialist Pager: [phone] After hours Pager: [phone]

## 2019-02-16 NOTE — Anesthesia Preprocedure Evaluation (Addendum)
Anesthesia Evaluation  Patient identified by MRN, date of birth, ID band Patient awake    Reviewed: Allergy & Precautions, NPO status , Patient's Chart, lab work & pertinent test results  Airway Mallampati: II  TM Distance: >3 FB     Dental   Pulmonary    breath sounds clear to auscultation       Cardiovascular hypertension,  Rhythm:Regular Rate:Normal     Neuro/Psych    GI/Hepatic hiatal hernia, GERD  ,  Endo/Other    Renal/GU Renal disease     Musculoskeletal   Abdominal   Peds  Hematology   Anesthesia Other Findings   Reproductive/Obstetrics                            Anesthesia Physical Anesthesia Plan  ASA: III  Anesthesia Plan: General   Post-op Pain Management:    Induction: Intravenous  PONV Risk Score and Plan: 3 and Ondansetron  Airway Management Planned: Oral ETT  Additional Equipment: Arterial line  Intra-op Plan:   Post-operative Plan:   Informed Consent:   Plan Discussed with: Anesthesiologist  Anesthesia Plan Comments:        Anesthesia Quick Evaluation

## 2019-02-16 NOTE — Consult Note (Signed)
ORTHOPAEDIC CONSULTATION  REQUESTING PHYSICIAN: Karmen Bongo, MD  PCP:  Patient, No Pcp Per  Chief Complaint: Right hip injury  HPI: Hannah Mercado is a 85 y.o. female with a past medical history of atrial fibrillation on Eliquis, pulmonary fibrosis, coronary artery disease, hyperlipidemia, rectal cancer, status post pacemaker placement who lives alone.  She either uses a cane or no assist device.  She has peripheral neuropathy, and her leg got numb when she was in the bathroom.  She fell onto her right hip.  She denies other injuries.  She had right hip pain and inability to weight-bear.  She was brought to the emergency department at [facility], where x-rays revealed a displaced, comminuted right intertrochanteric femur fracture.  She was admitted by the hospitalist for perioperative or stratification medical optimization.  The last dose of Eliquis was Wednesday night.  Past Medical History:  Diagnosis Date  . Allergy history unknown   . Anxiety   . Asthmatic bronchitis    use inhaler prn   . Atrial fibrillation ([place]) 10/16/2015  . Blood transfusion    ? with ectopic pregnancy 50 yrs ago   . Cardiac pacemaker in situ 02/24/2016  . Carotid artery disease ([place]) 03/12/2015  . Chronic venous insufficiency 09/11/2015  . Diaphragmatic hernia 07/31/2008   Qualifier: Diagnosis of  By: Lenna Gilford MD, Deborra Medina   . Diverticulosis of colon without hemorrhage 08/02/2012  . DJD (degenerative joint disease)   . Edema 09/11/2015  . GERD (gastroesophageal reflux disease)   . Hiatal hernia   . History of pneumonia    right middle lobe  . Hypercholesteremia   . HYPERCHOLESTEROLEMIA 04/19/2007   Qualifier: Diagnosis of  By: Julien Girt CMA, Marliss Czar    . Hypertension 04/14/2011  . Mitral valve disorder 04/20/2007   Qualifier: Diagnosis of  By: Lenna Gilford MD, Deborra Medina   . Osteopenia   . Persistent atrial fibrillation ([place])   . PULMONARY FIBROSIS, POSTINFLAMMATORY 07/31/2008   Qualifier: Diagnosis of  By:  Lenna Gilford MD, Deborra Medina   . Rectal cancer ([place])   . Right carotid bruit 08/28/2014   Past Surgical History:  Procedure Laterality Date  . APPENDECTOMY    . COLON RESECTION  02/08/2011   Procedure: LAPAROSCOPIC SIGMOID COLON RESECTION;  Surgeon: Stark Klein, MD;  Location: [facility];  Service: General;  Laterality: N/A;  Laparoscopic Lower Anterior Bowel Resection  . COLON SURGERY    . COLONOSCOPY  11/27/10   rectal cancer, small sigmoid adenoma, diverticulosis  . ECTOPIC PREGNANCY SURGERY     Midline incision  . EP IMPLANTABLE DEVICE N/A 10/16/2015   Procedure: Pacemaker Implant;  Surgeon: Will Meredith Leeds, MD;  Location: [facility];  Service: Cardiovascular;  Laterality: N/A;  . HAMMER TOE SURGERY  1995   Dr. Silverio Decamp  . Laparoscopic Left oophorectomy, laparoscopic low anterior resection, placement of OnQ pain pump  02/08/2011  . NASAL SINUS SURGERY  1994  . OVARIAN CYST REMOVAL  02/08/2011   Procedure: OVARIAN CYSTECTOMY;  Surgeon: Stark Klein, MD;  Location: [facility];  Service: General;  Laterality: Left;  Removal of Left Ovary and Pelvic Mass  . VAGINAL HYSTERECTOMY  1980's   Social History   Socioeconomic History  . Marital status: Widowed    Spouse name: Not on file  . Number of children: 2  . Years of education: Not on file  . Highest education level: Not on file  Occupational History  . Occupation: housewife    Employer: RETIRED  Social Needs  . Financial resource strain: Not on file  . Food insecurity    Worry: Not on file    Inability: Not on file  . Transportation needs    Medical: Not on file    Non-medical: Not on file  Tobacco Use  . Smoking status: Never Smoker  . Smokeless tobacco: Never Used  Substance and Sexual Activity  . Alcohol use: Yes  . Drug use: No  . Sexual activity: Not on file  Lifestyle  . Physical activity    Days per week: Not on file    Minutes per session: Not on file  . Stress: Not on file  Relationships  . Social  Herbalist on phone: Not on file    Gets together: Not on file    Attends religious service: Not on file    Active member of club or organization: Not on file    Attends meetings of clubs or organizations: Not on file    Relationship status: Not on file  Other Topics Concern  . Not on file  Social History Narrative  . Not on file   Family History  Problem Relation Age of Onset  . Heart disease Father   . Lung cancer Brother        x 2  . Colon cancer Neg Hx    Allergies  Allergen Reactions  . Sulfonamide Derivatives Nausea And Vomiting    Happened a long time ago, does not remember the other reaction she had.   Prior to Admission medications   Medication Sig Start Date End Date Taking? Authorizing Provider  albuterol (PROVENTIL HFA;VENTOLIN HFA) 108 (90 BASE) MCG/ACT inhaler Inhale 2 puffs into the lungs every 6 (six) hours as needed. Wheezing  10/09/10  Yes Noralee Space, MD  alendronate (FOSAMAX) 70 MG tablet Take 70 mg by mouth once a week. 11/26/18  Yes [provider]  amLODipine (NORVASC) 5 MG tablet TAKE 1 TABLET BY MOUTH EVERY DAY Patient taking differently: Take 5 mg by mouth daily.  08/31/18  Yes Noralee Space, MD  apixaban (ELIQUIS) 2.5 MG TABS tablet Take 1 tablet (2.5 mg total) by mouth 2 (two) times daily. 10/11/18  Yes Camnitz, Will Hassell Done, MD  ascorbic acid (VITAMIN C) 250 MG CHEW Chew 250 mg by mouth daily.    Yes [provider]  atorvastatin (LIPITOR) 20 MG tablet Take 0.5 tablets (10 mg total) by mouth daily. 09/08/17  Yes Noralee Space, MD  Calcium Carbonate-Vitamin D (CALCIUM-VITAMIN D) 500-200 MG-UNIT per tablet Take 2 tablets by mouth daily.    Yes [provider]  Cholecalciferol (VITAMIN D3) 1000 UNITS CAPS Take 1 capsule by mouth daily.    Yes [provider]  furosemide (LASIX) 20 MG tablet TAKE 1 TABLET BY MOUTH AS NEEDED FOR SWELLING Patient taking differently: Take 20 mg by mouth daily as needed for fluid  (swelling).  09/13/18  Yes Noralee Space, MD  vitamin B-12 (CYANOCOBALAMIN) 250 MCG tablet Take 250 mcg by mouth daily.    Yes [provider]  vitamin E (VITAMIN E) 200 UNIT capsule Take 200 Units by mouth daily.    Yes [provider]   Dg Chest Port 1 View  Result Date: 02/15/2019 CLINICAL DATA:  Fall, hip fracture EXAM: PORTABLE CHEST 1 VIEW COMPARISON:  03/18/2018 FINDINGS: Increased interstitial markings. No focal consolidation. Left lower lobe scarring. No pleural effusion or pneumothorax. Cardiomegaly.  Left subclavian pacemaker. IMPRESSION: No evidence  of acute cardiopulmonary disease. Electronically Signed   By: Julian Hy M.D.   On: 02/15/2019 10:29   Dg Knee Right Port  Result Date: 02/15/2019 CLINICAL DATA:  Right leg pain secondary to a fall today. Right proximal femur fracture. EXAM: PORTABLE RIGHT KNEE - 1-2 VIEW COMPARISON:  None. FINDINGS: There is no fracture or dislocation. Slight arthritic changes in the medial and patellofemoral compartments. Extensive arterial calcification. IMPRESSION: No acute abnormality. Slight arthritic changes in the medial and patellofemoral compartments. Electronically Signed   By: Lorriane Shire M.D.   On: 02/15/2019 11:43   Dg Hip Port Unilat W Or Wo Pelvis 1 View Right  Result Date: 02/15/2019 CLINICAL DATA:  Fall, right hip deformity EXAM: DG HIP (WITH OR WITHOUT PELVIS) 1V PORT RIGHT COMPARISON:  None. FINDINGS: Comminuted intertrochanteric right hip fracture. Varus angulation and foreshortening. Displaced lesser trochanteric fragment. Left hip is intact.  Bilateral hip joint spaces are preserved. Visualized bony pelvis appears intact. IMPRESSION: Comminuted intertrochanteric right hip fracture, as above. Electronically Signed   By: Julian Hy M.D.   On: 02/15/2019 10:28    Positive ROS: All other systems have been reviewed and were otherwise negative with the exception of those mentioned in the HPI and as above.   Physical Exam: General: Alert, no acute distress Cardiovascular: No pedal edema Respiratory: No cyanosis, no use of accessory musculature GI: No organomegaly, abdomen is soft and non-tender Skin: No lesions in the area of chief complaint Neurologic: Sensation intact distally Psychiatric: Patient is competent for consent with normal mood and affect Lymphatic: No axillary or cervical lymphadenopathy  MUSCULOSKELETAL: Examination of the right hip reveals no skin wounds or lesions.  She is shortened and rotated.  She has subjective sensory change which is baseline.  She has palpable pulses.  She has positive motor function dorsiflexion, plantarflexion, and great toe extension.  Assessment: Displaced right intertrochanteric femur fracture. Multiple medical problems. Eliquis usage.  Plan: I discussed the findings with the patient.  Her hip fracture requires surgical stabilization for pain control and to allow mobilization.  Plan for surgery Saturday morning due to Eliquis usage and the risk of bleeding.  We did discuss the risk, benefits, alternatives.  Please see statement of risk.  Hold chemical DVT prophylaxis.  Hold bisphosphonates for 6 months postoperatively.    Bertram Savin, MD [phone]    02/16/2019 12:21 AM

## 2019-02-16 NOTE — Progress Notes (Signed)
Progress Note    Christell Steinmiller  YIF:[id number] DOB: 03/29/1922  DOA: 02/15/2019 PCP: Patient, No Pcp Per      Brief Narrative:    Medical records reviewed and are as summarized below:  Signa Cheek is an 85 y.o. female  with medical history significant of rectal cancer, atrial fibrillation on Eliquis, hypertension, hyperlipidemia, CAD, pulmonary fibrosis, permanent pacemaker in place, presented with a fall    Assessment/Plan:   Principal Problem:   Closed hip fracture, right, initial encounter ([place]) Active Problems:   HYPERCHOLESTEROLEMIA   PULMONARY FIBROSIS, POSTINFLAMMATORY   pT2pN0 (0/12 lymph nodes) cM0 Rectal Cancer   Hypertension   Carotid artery disease ([place])   Persistent atrial fibrillation ([place])   Cardiac pacemaker in situ   Stage 3a chronic kidney disease   Protein-calorie malnutrition, severe   Body mass index is 19.58 kg/m.   Right intertrochanteric femur fracture, s/p fall: Analgesics as needed for pain.  Plan for surgical intervention tomorrow.  Follow-up with orthopedic surgeon.  Hypertension: Hold amlodipine since BP is soft.  CAD/ atrial fibrillation/permanent pacemaker in place: Eliquis has been held for surgery.  Osteoporosis: Outpatient follow-up with PCP  Pulmonary fibrosis/pulmonary hypertension: No acute pulmonary issues at this time.  Stage III CKD: Creatinine is stable.  Severe protein calorie malnutrition: Continue nutritional supplements.  Follow-up with dietitian.   Family Communication/Anticipated D/C date and plan/Code Status   DVT prophylaxis: SCDs.  Eliquis on hold for surgery Code Status: DNR Family Communication: Discussed with the patient Disposition Plan: Likely discharge to SNF in 3 to 4 days      Subjective:   C/o severe right hip pain worse with movement.  She gets some relief with analgesics.   Objective:    Vitals:   02/15/19 2008 02/16/19 0805 02/16/19 1046 02/16/19 1537  BP:  (!) 127/59 (!) 102/48 (!) 118/56 (!) 128/49  Pulse: 62 60 63 61  Resp: 16 18 18 17   Temp: 99 F (37.2 C) 98.2 F (36.8 C) 97.6 F (36.4 C) 98.1 F (36.7 C)  TempSrc: Oral Oral Oral Oral  SpO2: 99% 96% 97% 97%  Weight:      Height:        Intake/Output Summary (Last 24 hours) at 02/16/2019 1716 Last data filed at 02/16/2019 1431 Gross per 24 hour  Intake 483.45 ml  Output 150 ml  Net 333.45 ml   Filed Weights   02/15/19 0935  Weight: 56.7 kg    Exam:  GEN: NAD SKIN: No rash EYES: EOMI ENT: MMM CV: RRR PULM: CTA B ABD: soft, ND, NT, +BS CNS: AAO x 3, non focal EXT: Right hip tenderness, swelling and erythema of right upper medial thigh   Data Reviewed:   I have personally reviewed following labs and imaging studies:  Labs: Labs show the following:   Basic Metabolic Panel: Recent Labs  Lab 02/15/19 0945 02/16/19 0407  NA 139 140  K 4.5 4.4  CL 105 104  CO2 25 26  GLUCOSE 156* 107*  BUN 15 20  CREATININE 1.05* 1.08*  CALCIUM 8.6* 8.3*   GFR Estimated Creatinine Clearance: 28.5 mL/min (A) (by C-G formula based on SCr of 1.08 mg/dL (H)). Liver Function Tests: No results for input(s): AST, ALT, ALKPHOS, BILITOT, PROT, ALBUMIN in the last 168 hours. No results for input(s): LIPASE, AMYLASE in the last 168 hours. No results for input(s): AMMONIA in the last 168 hours. Coagulation profile Recent Labs  Lab 02/15/19 0945  INR 1.1  CBC: Recent Labs  Lab 02/15/19 0945 02/16/19 0407  WBC 15.6* 6.9  NEUTROABS 13.6*  --   HGB 11.9* 9.8*  HCT 37.9 30.9*  MCV 108.3* 106.6*  PLT 226 175   Cardiac Enzymes: No results for input(s): CKTOTAL, CKMB, CKMBINDEX, TROPONINI in the last 168 hours. BNP (last 3 results) No results for input(s): PROBNP in the last 8760 hours. CBG: No results for input(s): GLUCAP in the last 168 hours. D-Dimer: No results for input(s): DDIMER in the last 72 hours. Hgb A1c: No results for input(s): HGBA1C in the last 72  hours. Lipid Profile: No results for input(s): CHOL, HDL, LDLCALC, TRIG, CHOLHDL, LDLDIRECT in the last 72 hours. Thyroid function studies: No results for input(s): TSH, T4TOTAL, T3FREE, THYROIDAB in the last 72 hours.  Invalid input(s): FREET3 Anemia work up: No results for input(s): VITAMINB12, FOLATE, FERRITIN, TIBC, IRON, RETICCTPCT in the last 72 hours. Sepsis Labs: Recent Labs  Lab 02/15/19 0945 02/16/19 0407  WBC 15.6* 6.9    Microbiology Recent Results (from the past 240 hour(s))  Surgical pcr screen     Status: None   Collection Time: 02/15/19  3:24 PM   Specimen: Nasal Mucosa; Nasal Swab  Result Value Ref Range Status   MRSA, PCR NEGATIVE NEGATIVE Final   Staphylococcus aureus NEGATIVE NEGATIVE Final    Comment: (NOTE) The Xpert SA Assay (FDA approved for NASAL specimens in patients 24 years of age and older), is one component of a comprehensive surveillance program. It is not intended to diagnose infection nor to guide or monitor treatment. Performed at [facility], [place] [address] [place], [place] [address]     Procedures and diagnostic studies:  Dg Chest Port 1 View  Result Date: 02/15/2019 CLINICAL DATA:  Fall, hip fracture EXAM: PORTABLE CHEST 1 VIEW COMPARISON:  03/18/2018 FINDINGS: Increased interstitial markings. No focal consolidation. Left lower lobe scarring. No pleural effusion or pneumothorax. Cardiomegaly.  Left subclavian pacemaker. IMPRESSION: No evidence of acute cardiopulmonary disease. Electronically Signed   By: Julian Hy M.D.   On: 02/15/2019 10:29   Dg Knee Right Port  Result Date: 02/15/2019 CLINICAL DATA:  Right leg pain secondary to a fall today. Right proximal femur fracture. EXAM: PORTABLE RIGHT KNEE - 1-2 VIEW COMPARISON:  None. FINDINGS: There is no fracture or dislocation. Slight arthritic changes in the medial and patellofemoral compartments. Extensive arterial calcification. IMPRESSION: No acute abnormality. Slight  arthritic changes in the medial and patellofemoral compartments. Electronically Signed   By: Lorriane Shire M.D.   On: 02/15/2019 11:43   Dg Hip Port Unilat W Or Wo Pelvis 1 View Right  Result Date: 02/15/2019 CLINICAL DATA:  Fall, right hip deformity EXAM: DG HIP (WITH OR WITHOUT PELVIS) 1V PORT RIGHT COMPARISON:  None. FINDINGS: Comminuted intertrochanteric right hip fracture. Varus angulation and foreshortening. Displaced lesser trochanteric fragment. Left hip is intact.  Bilateral hip joint spaces are preserved. Visualized bony pelvis appears intact. IMPRESSION: Comminuted intertrochanteric right hip fracture, as above. Electronically Signed   By: Julian Hy M.D.   On: 02/15/2019 10:28    Medications:   . atorvastatin  10 mg Oral Daily  . chlorhexidine  60 mL Topical Once  . feeding supplement (ENSURE ENLIVE)  237 mL Oral BID BM  . feeding supplement  296 mL Oral Once  . gabapentin  300 mg Oral QHS  . multivitamin with minerals  1 tablet Oral Daily  . pantoprazole  40 mg Oral Daily  . povidone-iodine  2  application Topical Once   Continuous Infusions: . sodium chloride Stopped (02/16/19 0919)  .  ceFAZolin (ANCEF) IV    . methocarbamol (ROBAXIN) IV    . [START ON 02/17/2019] tranexamic acid       LOS: 1 day   Bobbyjo Marulanda  [facility]   *Please refer to [email or web address], password [email or web address] to get updated schedule on who will round on this patient, as hospitalists switch teams weekly. If 7PM-7AM, please contact night-coverage at [email or web address], password [email or web address] for any overnight needs.  02/16/2019, 5:16 PM

## 2019-02-16 NOTE — Progress Notes (Signed)
Updated Son concerning how Mom did during the nightshift. Son made aware that Dr. Lyla Glassing visited at the bedside with Mom last night. If any changes occur with condition  or surgery schedule: please update Son Carrin Vannostrand: [phone].

## 2019-02-16 NOTE — H&P (View-Only) (Signed)
ORTHOPAEDIC CONSULTATION  REQUESTING PHYSICIAN: Karmen Bongo, MD  PCP:  Patient, No Pcp Per  Chief Complaint: Right hip injury  HPI: Hannah Mercado is a 85 y.o. female with a past medical history of atrial fibrillation on Eliquis, pulmonary fibrosis, coronary artery disease, hyperlipidemia, rectal cancer, status post pacemaker placement who lives alone.  She either uses a cane or no assist device.  She has peripheral neuropathy, and her leg got numb when she was in the bathroom.  She fell onto her right hip.  She denies other injuries.  She had right hip pain and inability to weight-bear.  She was brought to the emergency department at [facility], where x-rays revealed a displaced, comminuted right intertrochanteric femur fracture.  She was admitted by the hospitalist for perioperative or stratification medical optimization.  The last dose of Eliquis was Wednesday night.  Past Medical History:  Diagnosis Date  . Allergy history unknown   . Anxiety   . Asthmatic bronchitis    use inhaler prn   . Atrial fibrillation ([place]) 10/16/2015  . Blood transfusion    ? with ectopic pregnancy 50 yrs ago   . Cardiac pacemaker in situ 02/24/2016  . Carotid artery disease ([place]) 03/12/2015  . Chronic venous insufficiency 09/11/2015  . Diaphragmatic hernia 07/31/2008   Qualifier: Diagnosis of  By: Lenna Gilford MD, Deborra Medina   . Diverticulosis of colon without hemorrhage 08/02/2012  . DJD (degenerative joint disease)   . Edema 09/11/2015  . GERD (gastroesophageal reflux disease)   . Hiatal hernia   . History of pneumonia    right middle lobe  . Hypercholesteremia   . HYPERCHOLESTEROLEMIA 04/19/2007   Qualifier: Diagnosis of  By: Julien Girt CMA, Marliss Czar    . Hypertension 04/14/2011  . Mitral valve disorder 04/20/2007   Qualifier: Diagnosis of  By: Lenna Gilford MD, Deborra Medina   . Osteopenia   . Persistent atrial fibrillation ([place])   . PULMONARY FIBROSIS, POSTINFLAMMATORY 07/31/2008   Qualifier: Diagnosis of  By:  Lenna Gilford MD, Deborra Medina   . Rectal cancer ([place])   . Right carotid bruit 08/28/2014   Past Surgical History:  Procedure Laterality Date  . APPENDECTOMY    . COLON RESECTION  02/08/2011   Procedure: LAPAROSCOPIC SIGMOID COLON RESECTION;  Surgeon: Stark Klein, MD;  Location: [facility];  Service: General;  Laterality: N/A;  Laparoscopic Lower Anterior Bowel Resection  . COLON SURGERY    . COLONOSCOPY  11/27/10   rectal cancer, small sigmoid adenoma, diverticulosis  . ECTOPIC PREGNANCY SURGERY     Midline incision  . EP IMPLANTABLE DEVICE N/A 10/16/2015   Procedure: Pacemaker Implant;  Surgeon: Will Meredith Leeds, MD;  Location: [facility];  Service: Cardiovascular;  Laterality: N/A;  . HAMMER TOE SURGERY  1995   Dr. Silverio Decamp  . Laparoscopic Left oophorectomy, laparoscopic low anterior resection, placement of OnQ pain pump  02/08/2011  . NASAL SINUS SURGERY  1994  . OVARIAN CYST REMOVAL  02/08/2011   Procedure: OVARIAN CYSTECTOMY;  Surgeon: Stark Klein, MD;  Location: [facility];  Service: General;  Laterality: Left;  Removal of Left Ovary and Pelvic Mass  . VAGINAL HYSTERECTOMY  1980's   Social History   Socioeconomic History  . Marital status: Widowed    Spouse name: Not on file  . Number of children: 2  . Years of education: Not on file  . Highest education level: Not on file  Occupational History  . Occupation: housewife    Employer: RETIRED  Social Needs  . Financial resource strain: Not on file  . Food insecurity    Worry: Not on file    Inability: Not on file  . Transportation needs    Medical: Not on file    Non-medical: Not on file  Tobacco Use  . Smoking status: Never Smoker  . Smokeless tobacco: Never Used  Substance and Sexual Activity  . Alcohol use: Yes  . Drug use: No  . Sexual activity: Not on file  Lifestyle  . Physical activity    Days per week: Not on file    Minutes per session: Not on file  . Stress: Not on file  Relationships  . Social  Herbalist on phone: Not on file    Gets together: Not on file    Attends religious service: Not on file    Active member of club or organization: Not on file    Attends meetings of clubs or organizations: Not on file    Relationship status: Not on file  Other Topics Concern  . Not on file  Social History Narrative  . Not on file   Family History  Problem Relation Age of Onset  . Heart disease Father   . Lung cancer Brother        x 2  . Colon cancer Neg Hx    Allergies  Allergen Reactions  . Sulfonamide Derivatives Nausea And Vomiting    Happened a long time ago, does not remember the other reaction she had.   Prior to Admission medications   Medication Sig Start Date End Date Taking? Authorizing Provider  albuterol (PROVENTIL HFA;VENTOLIN HFA) 108 (90 BASE) MCG/ACT inhaler Inhale 2 puffs into the lungs every 6 (six) hours as needed. Wheezing  10/09/10  Yes Noralee Space, MD  alendronate (FOSAMAX) 70 MG tablet Take 70 mg by mouth once a week. 11/26/18  Yes [provider]  amLODipine (NORVASC) 5 MG tablet TAKE 1 TABLET BY MOUTH EVERY DAY Patient taking differently: Take 5 mg by mouth daily.  08/31/18  Yes Noralee Space, MD  apixaban (ELIQUIS) 2.5 MG TABS tablet Take 1 tablet (2.5 mg total) by mouth 2 (two) times daily. 10/11/18  Yes Camnitz, Will Hassell Done, MD  ascorbic acid (VITAMIN C) 250 MG CHEW Chew 250 mg by mouth daily.    Yes [provider]  atorvastatin (LIPITOR) 20 MG tablet Take 0.5 tablets (10 mg total) by mouth daily. 09/08/17  Yes Noralee Space, MD  Calcium Carbonate-Vitamin D (CALCIUM-VITAMIN D) 500-200 MG-UNIT per tablet Take 2 tablets by mouth daily.    Yes [provider]  Cholecalciferol (VITAMIN D3) 1000 UNITS CAPS Take 1 capsule by mouth daily.    Yes [provider]  furosemide (LASIX) 20 MG tablet TAKE 1 TABLET BY MOUTH AS NEEDED FOR SWELLING Patient taking differently: Take 20 mg by mouth daily as needed for fluid  (swelling).  09/13/18  Yes Noralee Space, MD  vitamin B-12 (CYANOCOBALAMIN) 250 MCG tablet Take 250 mcg by mouth daily.    Yes [provider]  vitamin E (VITAMIN E) 200 UNIT capsule Take 200 Units by mouth daily.    Yes [provider]   Dg Chest Port 1 View  Result Date: 02/15/2019 CLINICAL DATA:  Fall, hip fracture EXAM: PORTABLE CHEST 1 VIEW COMPARISON:  03/18/2018 FINDINGS: Increased interstitial markings. No focal consolidation. Left lower lobe scarring. No pleural effusion or pneumothorax. Cardiomegaly.  Left subclavian pacemaker. IMPRESSION: No evidence  of acute cardiopulmonary disease. Electronically Signed   By: Julian Hy M.D.   On: 02/15/2019 10:29   Dg Knee Right Port  Result Date: 02/15/2019 CLINICAL DATA:  Right leg pain secondary to a fall today. Right proximal femur fracture. EXAM: PORTABLE RIGHT KNEE - 1-2 VIEW COMPARISON:  None. FINDINGS: There is no fracture or dislocation. Slight arthritic changes in the medial and patellofemoral compartments. Extensive arterial calcification. IMPRESSION: No acute abnormality. Slight arthritic changes in the medial and patellofemoral compartments. Electronically Signed   By: Lorriane Shire M.D.   On: 02/15/2019 11:43   Dg Hip Port Unilat W Or Wo Pelvis 1 View Right  Result Date: 02/15/2019 CLINICAL DATA:  Fall, right hip deformity EXAM: DG HIP (WITH OR WITHOUT PELVIS) 1V PORT RIGHT COMPARISON:  None. FINDINGS: Comminuted intertrochanteric right hip fracture. Varus angulation and foreshortening. Displaced lesser trochanteric fragment. Left hip is intact.  Bilateral hip joint spaces are preserved. Visualized bony pelvis appears intact. IMPRESSION: Comminuted intertrochanteric right hip fracture, as above. Electronically Signed   By: Julian Hy M.D.   On: 02/15/2019 10:28    Positive ROS: All other systems have been reviewed and were otherwise negative with the exception of those mentioned in the HPI and as above.   Physical Exam: General: Alert, no acute distress Cardiovascular: No pedal edema Respiratory: No cyanosis, no use of accessory musculature GI: No organomegaly, abdomen is soft and non-tender Skin: No lesions in the area of chief complaint Neurologic: Sensation intact distally Psychiatric: Patient is competent for consent with normal mood and affect Lymphatic: No axillary or cervical lymphadenopathy  MUSCULOSKELETAL: Examination of the right hip reveals no skin wounds or lesions.  She is shortened and rotated.  She has subjective sensory change which is baseline.  She has palpable pulses.  She has positive motor function dorsiflexion, plantarflexion, and great toe extension.  Assessment: Displaced right intertrochanteric femur fracture. Multiple medical problems. Eliquis usage.  Plan: I discussed the findings with the patient.  Her hip fracture requires surgical stabilization for pain control and to allow mobilization.  Plan for surgery Saturday morning due to Eliquis usage and the risk of bleeding.  We did discuss the risk, benefits, alternatives.  Please see statement of risk.  Hold chemical DVT prophylaxis.  Hold bisphosphonates for 6 months postoperatively.    Bertram Savin, MD [phone]    02/16/2019 12:21 AM

## 2019-02-17 ENCOUNTER — Inpatient Hospital Stay (HOSPITAL_COMMUNITY): Payer: Medicare Other | Admitting: Anesthesiology

## 2019-02-17 ENCOUNTER — Inpatient Hospital Stay (HOSPITAL_COMMUNITY): Payer: Medicare Other

## 2019-02-17 ENCOUNTER — Encounter (HOSPITAL_COMMUNITY)
Admission: EM | Disposition: A | Discharge status: Rehabilitation Facility | Payer: Self-pay | Source: Home / Self Care | Attending: Internal Medicine

## 2019-02-17 DIAGNOSIS — E43 Unspecified severe protein-calorie malnutrition: Secondary | ICD-10-CM

## 2019-02-17 HISTORY — PX: FEMUR IM NAIL: SHX1597

## 2019-02-17 LAB — CBC WITH DIFFERENTIAL/PLATELET
Abs Immature Granulocytes: 0.03 10*3/uL (ref 0.00–0.07)
Basophils Absolute: 0 10*3/uL (ref 0.0–0.1)
Basophils Relative: 0 %
Eosinophils Absolute: 0.1 10*3/uL (ref 0.0–0.5)
Eosinophils Relative: 1 %
HCT: 28.8 % — ABNORMAL LOW (ref 36.0–46.0)
Hemoglobin: 9.6 g/dL — ABNORMAL LOW (ref 12.0–15.0)
Immature Granulocytes: 0 %
Lymphocytes Relative: 14 %
Lymphs Abs: 1.3 10*3/uL (ref 0.7–4.0)
MCH: 34.4 pg — ABNORMAL HIGH (ref 26.0–34.0)
MCHC: 33.3 g/dL (ref 30.0–36.0)
MCV: 103.2 fL — ABNORMAL HIGH (ref 80.0–100.0)
Monocytes Absolute: 1 10*3/uL (ref 0.1–1.0)
Monocytes Relative: 10 %
Neutro Abs: 6.9 10*3/uL (ref 1.7–7.7)
Neutrophils Relative %: 75 %
Platelets: 185 10*3/uL (ref 150–400)
RBC: 2.79 MIL/uL — ABNORMAL LOW (ref 3.87–5.11)
RDW: 13 % (ref 11.5–15.5)
WBC: 9.2 10*3/uL (ref 4.0–10.5)
nRBC: 0 % (ref 0.0–0.2)

## 2019-02-17 LAB — CREATININE, SERUM
Creatinine, Ser: 0.97 mg/dL (ref 0.44–1.00)
GFR calc Af Amer: 58 mL/min — ABNORMAL LOW (ref >60)
GFR calc non Af Amer: 50 mL/min — ABNORMAL LOW (ref >60)

## 2019-02-17 LAB — CBC
HCT: 30.3 % — ABNORMAL LOW (ref 36.0–46.0)
Hemoglobin: 9.8 g/dL — ABNORMAL LOW (ref 12.0–15.0)
MCH: 34.1 pg — ABNORMAL HIGH (ref 26.0–34.0)
MCHC: 32.3 g/dL (ref 30.0–36.0)
MCV: 105.6 fL — ABNORMAL HIGH (ref 80.0–100.0)
Platelets: 191 10*3/uL (ref 150–400)
RBC: 2.87 MIL/uL — ABNORMAL LOW (ref 3.87–5.11)
RDW: 13.1 % (ref 11.5–15.5)
WBC: 14.2 10*3/uL — ABNORMAL HIGH (ref 4.0–10.5)
nRBC: 0 % (ref 0.0–0.2)

## 2019-02-17 SURGERY — INSERTION, INTRAMEDULLARY ROD, FEMUR
Anesthesia: General | Site: Hip | Laterality: Right

## 2019-02-17 MED ORDER — FENTANYL CITRATE (PF) 100 MCG/2ML IJ SOLN
25.0000 ug | INTRAMUSCULAR | Status: DC | PRN
  Administered 2019-02-17 (×2): 25 ug via INTRAVENOUS

## 2019-02-17 MED ORDER — PHENYLEPHRINE 40 MCG/ML (10ML) SYRINGE FOR IV PUSH (FOR BLOOD PRESSURE SUPPORT)
PREFILLED_SYRINGE | INTRAVENOUS | Status: DC | PRN
  Administered 2019-02-17 (×2): 200 ug via INTRAVENOUS

## 2019-02-17 MED ORDER — APIXABAN 2.5 MG PO TABS
2.5000 mg | ORAL_TABLET | Freq: Two times a day (BID) | ORAL | Status: DC
  Filled 2019-02-17: qty 1

## 2019-02-17 MED ORDER — ENOXAPARIN SODIUM 40 MG/0.4ML ~~LOC~~ SOLN: 40.0000 mg | SUBCUTANEOUS | Status: DC

## 2019-02-17 MED ORDER — SUCCINYLCHOLINE CHLORIDE 200 MG/10ML IV SOSY
PREFILLED_SYRINGE | INTRAVENOUS | Status: DC | PRN
  Administered 2019-02-17: 140 mg via INTRAVENOUS

## 2019-02-17 MED ORDER — METOCLOPRAMIDE HCL 5 MG PO TABS: 5.0000 mg | ORAL_TABLET | Freq: Three times a day (TID) | ORAL | Status: DC | PRN

## 2019-02-17 MED ORDER — CEFAZOLIN SODIUM-DEXTROSE 2-4 GM/100ML-% IV SOLN
2.0000 g | Freq: Two times a day (BID) | INTRAVENOUS | Status: AC
  Administered 2019-02-17 – 2019-02-18 (×2): 2 g via INTRAVENOUS
  Filled 2019-02-17 (×2): qty 100

## 2019-02-17 MED ORDER — METOCLOPRAMIDE HCL 5 MG/ML IJ SOLN: 5.0000 mg | Freq: Three times a day (TID) | INTRAMUSCULAR | Status: DC | PRN

## 2019-02-17 MED ORDER — FENTANYL CITRATE (PF) 100 MCG/2ML IJ SOLN
INTRAMUSCULAR | Status: DC | PRN
  Administered 2019-02-17: 25 ug via INTRAVENOUS
  Administered 2019-02-17 (×2): 50 ug via INTRAVENOUS
  Administered 2019-02-17: 25 ug via INTRAVENOUS

## 2019-02-17 MED ORDER — PHENOL 1.4 % MT LIQD: 1.0000 | OROMUCOSAL | Status: DC | PRN

## 2019-02-17 MED ORDER — DOCUSATE SODIUM 100 MG PO CAPS
100.0000 mg | ORAL_CAPSULE | Freq: Two times a day (BID) | ORAL | Status: DC
  Administered 2019-02-17 – 2019-02-19 (×5): 100 mg via ORAL
  Filled 2019-02-17 (×5): qty 1

## 2019-02-17 MED ORDER — PROPOFOL 10 MG/ML IV BOLUS
INTRAVENOUS | Status: AC
  Filled 2019-02-17: qty 20

## 2019-02-17 MED ORDER — APIXABAN 2.5 MG PO TABS
2.5000 mg | ORAL_TABLET | Freq: Two times a day (BID) | ORAL | Status: DC
  Administered 2019-02-18 – 2019-02-19 (×3): 2.5 mg via ORAL
  Filled 2019-02-17 (×4): qty 1

## 2019-02-17 MED ORDER — APIXABAN 5 MG PO TABS
5.0000 mg | ORAL_TABLET | Freq: Two times a day (BID) | ORAL | Status: DC
  Filled 2019-02-17: qty 1

## 2019-02-17 MED ORDER — FENTANYL CITRATE (PF) 100 MCG/2ML IJ SOLN
INTRAMUSCULAR | Status: AC
  Filled 2019-02-17: qty 2

## 2019-02-17 MED ORDER — ROCURONIUM BROMIDE 10 MG/ML (PF) SYRINGE
PREFILLED_SYRINGE | INTRAVENOUS | Status: AC
  Filled 2019-02-17: qty 10

## 2019-02-17 MED ORDER — LACTATED RINGERS IV SOLN
INTRAVENOUS | Status: DC
  Administered 2019-02-17: 08:00:00 via INTRAVENOUS

## 2019-02-17 MED ORDER — ONDANSETRON HCL 4 MG PO TABS: 4.0000 mg | ORAL_TABLET | Freq: Four times a day (QID) | ORAL | Status: DC | PRN

## 2019-02-17 MED ORDER — ONDANSETRON HCL 4 MG/2ML IJ SOLN
INTRAMUSCULAR | Status: DC | PRN
  Administered 2019-02-17: 4 mg via INTRAVENOUS

## 2019-02-17 MED ORDER — 0.9 % SODIUM CHLORIDE (POUR BTL) OPTIME
TOPICAL | Status: DC | PRN
  Administered 2019-02-17: 1000 mL

## 2019-02-17 MED ORDER — LIDOCAINE 2% (20 MG/ML) 5 ML SYRINGE
INTRAMUSCULAR | Status: AC
  Filled 2019-02-17: qty 5

## 2019-02-17 MED ORDER — FENTANYL CITRATE (PF) 250 MCG/5ML IJ SOLN
INTRAMUSCULAR | Status: AC
  Filled 2019-02-17: qty 5

## 2019-02-17 MED ORDER — LIDOCAINE 2% (20 MG/ML) 5 ML SYRINGE
INTRAMUSCULAR | Status: DC | PRN
  Administered 2019-02-17: 60 mg via INTRAVENOUS

## 2019-02-17 MED ORDER — CEFAZOLIN SODIUM-DEXTROSE 2-4 GM/100ML-% IV SOLN
INTRAVENOUS | Status: AC
  Filled 2019-02-17: qty 100

## 2019-02-17 MED ORDER — SUCCINYLCHOLINE CHLORIDE 200 MG/10ML IV SOSY
PREFILLED_SYRINGE | INTRAVENOUS | Status: AC
  Filled 2019-02-17: qty 10

## 2019-02-17 MED ORDER — ONDANSETRON HCL 4 MG/2ML IJ SOLN
INTRAMUSCULAR | Status: AC
  Filled 2019-02-17: qty 2

## 2019-02-17 MED ORDER — ONDANSETRON HCL 4 MG/2ML IJ SOLN
4.0000 mg | Freq: Four times a day (QID) | INTRAMUSCULAR | Status: DC | PRN
  Administered 2019-02-18 – 2019-02-19 (×2): 4 mg via INTRAVENOUS
  Filled 2019-02-17 (×2): qty 2

## 2019-02-17 MED ORDER — ALBUMIN HUMAN 5 % IV SOLN
INTRAVENOUS | Status: DC | PRN
  Administered 2019-02-17: 08:00:00 via INTRAVENOUS

## 2019-02-17 MED ORDER — DEXAMETHASONE SODIUM PHOSPHATE 10 MG/ML IJ SOLN
INTRAMUSCULAR | Status: DC | PRN
  Administered 2019-02-17: 10 mg via INTRAVENOUS

## 2019-02-17 MED ORDER — TRAZODONE HCL 50 MG PO TABS
50.0000 mg | ORAL_TABLET | Freq: Once | ORAL | Status: AC
  Administered 2019-02-17: 50 mg via ORAL
  Filled 2019-02-17: qty 1

## 2019-02-17 MED ORDER — DEXAMETHASONE SODIUM PHOSPHATE 10 MG/ML IJ SOLN
INTRAMUSCULAR | Status: AC
  Filled 2019-02-17: qty 1

## 2019-02-17 MED ORDER — PHENYLEPHRINE HCL-NACL 10-0.9 MG/250ML-% IV SOLN
INTRAVENOUS | Status: DC | PRN
  Administered 2019-02-17: 25 ug/min via INTRAVENOUS

## 2019-02-17 MED ORDER — EPHEDRINE SULFATE-NACL 50-0.9 MG/10ML-% IV SOSY
PREFILLED_SYRINGE | INTRAVENOUS | Status: DC | PRN
  Administered 2019-02-17: 10 mg via INTRAVENOUS

## 2019-02-17 MED ORDER — MENTHOL 3 MG MT LOZG: 1.0000 | LOZENGE | OROMUCOSAL | Status: DC | PRN

## 2019-02-17 MED ORDER — CEFAZOLIN SODIUM-DEXTROSE 2-4 GM/100ML-% IV SOLN
2.0000 g | INTRAVENOUS | Status: AC
  Administered 2019-02-17: 2 g via INTRAVENOUS
  Filled 2019-02-17: qty 100

## 2019-02-17 MED ORDER — PROPOFOL 10 MG/ML IV BOLUS
INTRAVENOUS | Status: DC | PRN
  Administered 2019-02-17: 150 mg via INTRAVENOUS

## 2019-02-17 SURGICAL SUPPLY — 53 items
ADH SKN CLS APL DERMABOND .7 (GAUZE/BANDAGES/DRESSINGS) ×1
ALCOHOL 70% 16 OZ (MISCELLANEOUS) ×2 IMPLANT
APL PRP STRL LF DISP 70% ISPRP (MISCELLANEOUS) ×1
BIT DRILL AO GAMMA 4.2X130 (BIT) IMPLANT
BIT DRILL AO GAMMA 4.2X180 (BIT) ×1 IMPLANT
BNDG COHESIVE 4X5 TAN STRL (GAUZE/BANDAGES/DRESSINGS) ×3 IMPLANT
CHLORAPREP W/TINT 26 (MISCELLANEOUS) ×2 IMPLANT
COVER PERINEAL POST (MISCELLANEOUS) ×2 IMPLANT
COVER SURGICAL LIGHT HANDLE (MISCELLANEOUS) ×2 IMPLANT
COVER WAND RF STERILE (DRAPES) ×2 IMPLANT
DERMABOND ADVANCED (GAUZE/BANDAGES/DRESSINGS) ×1
DERMABOND ADVANCED .7 DNX12 (GAUZE/BANDAGES/DRESSINGS) ×2 IMPLANT
DRAPE C-ARM 42X72 X-RAY (DRAPES) ×2 IMPLANT
DRAPE C-ARMOR (DRAPES) ×2 IMPLANT
DRAPE IMP U-DRAPE 54X76 (DRAPES) ×4 IMPLANT
DRAPE STERI IOBAN 125X83 (DRAPES) ×2 IMPLANT
DRAPE U-SHAPE 47X51 STRL (DRAPES) ×4 IMPLANT
DRSG MEPILEX BORDER 4X4 (GAUZE/BANDAGES/DRESSINGS) ×7 IMPLANT
ELECT REM PT RETURN 9FT ADLT (ELECTROSURGICAL) ×2
ELECTRODE REM PT RTRN 9FT ADLT (ELECTROSURGICAL) ×1 IMPLANT
FACESHIELD WRAPAROUND (MASK) ×2 IMPLANT
FACESHIELD WRAPAROUND OR TEAM (MASK) ×1 IMPLANT
GLOVE BIO SURGEON STRL SZ8.5 (GLOVE) ×4 IMPLANT
GLOVE BIOGEL PI IND STRL 8.5 (GLOVE) ×1 IMPLANT
GLOVE BIOGEL PI INDICATOR 8.5 (GLOVE) ×1
GOWN STRL REUS W/ TWL LRG LVL3 (GOWN DISPOSABLE) ×1 IMPLANT
GOWN STRL REUS W/TWL 2XL LVL3 (GOWN DISPOSABLE) ×2 IMPLANT
GOWN STRL REUS W/TWL LRG LVL3 (GOWN DISPOSABLE) ×2
GUIDEROD T2 3X1000 (ROD) ×1 IMPLANT
K-WIRE  3.2X450M STR (WIRE) ×1
K-WIRE 3.2X450M STR (WIRE) ×1
KIT NAIL LONG RIGHT TI (Nail) ×1 IMPLANT
KIT TURNOVER KIT B (KITS) ×2 IMPLANT
KWIRE 3.2X450M STR (WIRE) IMPLANT
MANIFOLD NEPTUNE II (INSTRUMENTS) ×1 IMPLANT
MARKER SKIN DUAL TIP RULER LAB (MISCELLANEOUS) ×2 IMPLANT
NS IRRIG 1000ML POUR BTL (IV SOLUTION) ×2 IMPLANT
PACK GENERAL/GYN (CUSTOM PROCEDURE TRAY) ×2 IMPLANT
PACK UNIVERSAL I (CUSTOM PROCEDURE TRAY) ×2 IMPLANT
PAD ARMBOARD 7.5X6 YLW CONV (MISCELLANEOUS) ×4 IMPLANT
REAMER SHAFT BIXCUT (INSTRUMENTS) ×1 IMPLANT
SCREW LAG GAMMA 3 TI 10.5X100M (Screw) ×1 IMPLANT
SCREW LOCKING T2 F/T  5X42.5MM (Screw) ×1 IMPLANT
SCREW LOCKING T2 F/T 5X42.5MM (Screw) IMPLANT
SUT MNCRL AB 3-0 PS2 18 (SUTURE) ×1 IMPLANT
SUT MNCRL AB 3-0 PS2 27 (SUTURE) ×2 IMPLANT
SUT MON AB 2-0 CT1 27 (SUTURE) ×1 IMPLANT
SUT MON AB 2-0 CT1 36 (SUTURE) ×2 IMPLANT
SUT VIC AB 1 CT1 27 (SUTURE) ×2
SUT VIC AB 1 CT1 27XBRD ANBCTR (SUTURE) ×1 IMPLANT
TOWEL GREEN STERILE (TOWEL DISPOSABLE) ×2 IMPLANT
TOWEL GREEN STERILE FF (TOWEL DISPOSABLE) ×2 IMPLANT
TRAY FOLEY MTR SLVR 14FR STAT (SET/KITS/TRAYS/PACK) ×1 IMPLANT

## 2019-02-17 NOTE — Discharge Instructions (Signed)
 Dr. Moesha Sarchet Adult Hip & Knee Specialist [facility] [address] [place], [place] [address] [phone]   POSTOPERATIVE DIRECTIONS    Hip Rehabilitation, Guidelines Following Surgery   WEIGHT BEARING Weight bearing as tolerated with assist device (walker, cane, etc) as directed, use it as long as suggested by your surgeon or therapist, typically at least 4-6 weeks.   HOME CARE INSTRUCTIONS  Remove items at home which could result in a fall. This includes throw rugs or furniture in walking pathways.  Continue medications as instructed at time of discharge.  You may have some home medications which will be placed on hold until you complete the course of blood thinner medication.  4 days after discharge, you may start showering. No tub baths or soaking your incisions. Do not put on socks or shoes without following the instructions of your caregivers.   Sit on chairs with arms. Use the chair arms to help push yourself up when arising.  Arrange for the use of a toilet seat elevator so you are not sitting low.   Walk with walker as instructed.  You may resume a sexual relationship in one month or when given the OK by your caregiver.  Use walker as long as suggested by your caregivers.  Avoid periods of inactivity such as sitting longer than an hour when not asleep. This helps prevent blood clots.  You may return to work once you are cleared by your surgeon.  Do not drive a car for 6 weeks or until released by your surgeon.  Do not drive while taking narcotics.  Wear elastic stockings for two weeks following surgery during the day but you may remove then at night.  Make sure you keep all of your appointments after your operation with all of your doctors and caregivers. You should call the office at the above phone number and make an appointment for approximately two weeks after the date of your surgery. Please pick up a stool softener and laxative  for home use as long as you are requiring pain medications.  ICE to the affected hip every three hours for 30 minutes at a time and then as needed for pain and swelling. Continue to use ice on the hip for pain and swelling from surgery. You may notice swelling that will progress down to the foot and ankle.  This is normal after surgery.  Elevate the leg when you are not up walking on it.   It is important for you to complete the blood thinner medication as prescribed by your doctor.  Continue to use the breathing machine which will help keep your temperature down.  It is common for your temperature to cycle up and down following surgery, especially at night when you are not up moving around and exerting yourself.  The breathing machine keeps your lungs expanded and your temperature down.  RANGE OF MOTION AND STRENGTHENING EXERCISES  These exercises are designed to help you keep full movement of your hip joint. Follow your caregiver's or physical therapist's instructions. Perform all exercises about fifteen times, three times per day or as directed. Exercise both hips, even if you have had only one joint replacement. These exercises can be done on a training (exercise) mat, on the floor, on a table or on a bed. Use whatever works the best and is most comfortable for you. Use music or television while you are exercising so that the exercises are a pleasant break in your day. This   will make your life better with the exercises acting as a break in routine you can look forward to.  Lying on your back, slowly slide your foot toward your buttocks, raising your knee up off the floor. Then slowly slide your foot back down until your leg is straight again.  Lying on your back spread your legs as far apart as you can without causing discomfort.  Lying on your side, raise your upper leg and foot straight up from the floor as far as is comfortable. Slowly lower the leg and repeat.  Lying on your back, tighten up the  muscle in the front of your thigh (quadriceps muscles). You can do this by keeping your leg straight and trying to raise your heel off the floor. This helps strengthen the largest muscle supporting your knee.  Lying on your back, tighten up the muscles of your buttocks both with the legs straight and with the knee bent at a comfortable angle while keeping your heel on the floor.   SKILLED REHAB INSTRUCTIONS: If the patient is transferred to a skilled rehab facility following release from the hospital, a list of the current medications will be sent to the facility for the patient to continue.  When discharged from the skilled rehab facility, please have the facility set up the patient's Home Health Physical Therapy prior to being released. Also, the skilled facility will be responsible for providing the patient with their medications at time of release from the facility to include their pain medication and their blood thinner medication. If the patient is still at the rehab facility at time of the two week follow up appointment, the skilled rehab facility will also need to assist the patient in arranging follow up appointment in our office and any transportation needs.  MAKE SURE YOU:  Understand these instructions.  Will watch your condition.  Will get help right away if you are not doing well or get worse.  Pick up stool softner and laxative for home use following surgery while on pain medications. Daily dry dressing changes as needed. In 4 days, you may remove your dressings and begin taking showers - no tub baths or soaking the incisions. Continue to use ice for pain and swelling after surgery. Do not use any lotions or creams on the incision until instructed by your surgeon.   

## 2019-02-17 NOTE — Anesthesia Postprocedure Evaluation (Signed)
Anesthesia Post Note  Patient: Hannah Mercado  Procedure(s) Performed: INTRAMEDULLARY (IM) NAIL FEMORAL (Right Hip)     Patient location during evaluation: PACU Anesthesia Type: General Level of consciousness: awake Pain management: pain level controlled Vital Signs Assessment: post-procedure vital signs reviewed and stable Respiratory status: spontaneous breathing Cardiovascular status: stable Postop Assessment: no apparent nausea or vomiting Anesthetic complications: no    Last Vitals:  Vitals:   02/17/19 1100 02/17/19 1127  BP: 126/76 (!) 139/58  Pulse: 63 60  Resp: 11 18  Temp: 36.8 C 36.5 C  SpO2: 90% 100%    Last Pain:  Vitals:   02/17/19 1218  TempSrc:   PainSc: Asleep                 Sheamus Hasting

## 2019-02-17 NOTE — Transfer of Care (Signed)
Immediate Anesthesia Transfer of Care Note  Patient: Hannah Mercado  Procedure(s) Performed: INTRAMEDULLARY (IM) NAIL FEMORAL (Right Hip)  Patient Location: PACU  Anesthesia Type:General  Level of Consciousness: drowsy and patient cooperative  Airway & Oxygen Therapy: Patient Spontanous Breathing  Post-op Assessment: Report given to RN and Post -op Vital signs reviewed and stable  Post vital signs: Reviewed and stable  Last Vitals:  Vitals Value Taken Time  BP    Temp    Pulse    Resp    SpO2      Last Pain:  Vitals:   02/17/19 1015  TempSrc:   PainSc: (P) 0-No pain      Patients Stated Pain Goal: 2 (83/66/29 4765)  Complications: No apparent anesthesia complications

## 2019-02-17 NOTE — Social Work (Signed)
CSW acknowledging consult for SNF placement. Will follow for therapy recommendations needed to best determine disposition/for insurance authorization.   Suellen Durocher, MSW, LCSWA [place] Clinical Social Work [phone]   

## 2019-02-17 NOTE — Progress Notes (Signed)
Progress Note    Hannah Mercado  UYQ:[id number] DOB: March 25, 1922  DOA: 02/15/2019 PCP: Patient, No Pcp Per      Brief Narrative:    Medical records reviewed and are as summarized below:  Hannah Mercado is an 85 y.o. female  with medical history significant of rectal cancer, atrial fibrillation on Eliquis, hypertension, hyperlipidemia, CAD, pulmonary fibrosis, permanent pacemaker in place, presented with a fall    Assessment/Plan:   Principal Problem:   Closed hip fracture, right, initial encounter ([place]) Active Problems:   HYPERCHOLESTEROLEMIA   PULMONARY FIBROSIS, POSTINFLAMMATORY   pT2pN0 (0/12 lymph nodes) cM0 Rectal Cancer   Hypertension   Carotid artery disease ([place])   Persistent atrial fibrillation ([place])   Cardiac pacemaker in situ   Stage 3a chronic kidney disease   Protein-calorie malnutrition, severe   Body mass index is 19.58 kg/m.   Right intertrochanteric femur fracture, s/p fall: s/p session of right femur.  Analgesics as needed for pain.   Follow-up with orthopedic surgeon.  Hypertension: Hold amlodipine for now  Leukocytosis: This is likely reactive from recent surgery.  Monitor CBC.  CAD/ atrial fibrillation/permanent pacemaker in place: Eliquis has been held for surgery.  Osteoporosis: Outpatient follow-up with PCP  Pulmonary fibrosis/pulmonary hypertension: No acute pulmonary issues at this time.  Stage III CKD: Creatinine is stable.  Severe protein calorie malnutrition: Continue nutritional supplements.  Follow-up with dietitian.   Family Communication/Anticipated D/C date and plan/Code Status   DVT prophylaxis: SCDs.  Eliquis on hold for surgery Code Status: DNR Family Communication: Discussed with the patient Disposition Plan: Likely discharge to SNF in 3 days      Subjective:   She complains of severe pain in the right hip.  She had surgery on her right hip today.   Objective:    Vitals:   02/17/19  1045 02/17/19 1100 02/17/19 1127 02/17/19 1542  BP: (!) 121/52 126/76 (!) 139/58 (!) 138/59  Pulse: 65 63 60 61  Resp: 18 11 18 18   Temp:  98.2 F (36.8 C) 97.7 F (36.5 C) 98.1 F (36.7 C)  TempSrc:   Oral Oral  SpO2: 96% 90% 100% 100%  Weight:      Height:        Intake/Output Summary (Last 24 hours) at 02/17/2019 1601 Last data filed at 02/17/2019 1200 Gross per 24 hour  Intake 1490 ml  Output 100 ml  Net 1390 ml   Filed Weights   02/15/19 0935  Weight: 56.7 kg    Exam:   GEN: NAD SKIN: dressing on right hip surgical wound is clean, dry and intact EYES: No pallor or icterus ENT: MMM CV: RRR PULM: CTA B ABD: soft, ND, NT, +BS CNS: AAO x 3, non focal EXT: Right hip tenderness and swelling.    Data Reviewed:   I have personally reviewed following labs and imaging studies:  Labs: Labs show the following:   Basic Metabolic Panel: Recent Labs  Lab 02/15/19 0945 02/16/19 0407 02/17/19 1144  NA 139 140  --   K 4.5 4.4  --   CL 105 104  --   CO2 25 26  --   GLUCOSE 156* 107*  --   BUN 15 20  --   CREATININE 1.05* 1.08* 0.97  CALCIUM 8.6* 8.3*  --    GFR Estimated Creatinine Clearance: 31.7 mL/min (by C-G formula based on SCr of 0.97 mg/dL). Liver Function Tests: No results for input(s): AST, ALT, ALKPHOS, BILITOT, PROT,  ALBUMIN in the last 168 hours. No results for input(s): LIPASE, AMYLASE in the last 168 hours. No results for input(s): AMMONIA in the last 168 hours. Coagulation profile Recent Labs  Lab 02/15/19 0945  INR 1.1    CBC: Recent Labs  Lab 02/15/19 0945 02/16/19 0407 02/17/19 0313 02/17/19 1144  WBC 15.6* 6.9 9.2 14.2*  NEUTROABS 13.6*  --  6.9  --   HGB 11.9* 9.8* 9.6* 9.8*  HCT 37.9 30.9* 28.8* 30.3*  MCV 108.3* 106.6* 103.2* 105.6*  PLT 226 175 185 191   Cardiac Enzymes: No results for input(s): CKTOTAL, CKMB, CKMBINDEX, TROPONINI in the last 168 hours. BNP (last 3 results) No results for input(s): PROBNP in the  last 8760 hours. CBG: No results for input(s): GLUCAP in the last 168 hours. D-Dimer: No results for input(s): DDIMER in the last 72 hours. Hgb A1c: No results for input(s): HGBA1C in the last 72 hours. Lipid Profile: No results for input(s): CHOL, HDL, LDLCALC, TRIG, CHOLHDL, LDLDIRECT in the last 72 hours. Thyroid function studies: No results for input(s): TSH, T4TOTAL, T3FREE, THYROIDAB in the last 72 hours.  Invalid input(s): FREET3 Anemia work up: No results for input(s): VITAMINB12, FOLATE, FERRITIN, TIBC, IRON, RETICCTPCT in the last 72 hours. Sepsis Labs: Recent Labs  Lab 02/15/19 0945 02/16/19 0407 02/17/19 0313 02/17/19 1144  WBC 15.6* 6.9 9.2 14.2*    Microbiology Recent Results (from the past 240 hour(s))  Surgical pcr screen     Status: None   Collection Time: 02/15/19  3:24 PM   Specimen: Nasal Mucosa; Nasal Swab  Result Value Ref Range Status   MRSA, PCR NEGATIVE NEGATIVE Final   Staphylococcus aureus NEGATIVE NEGATIVE Final    Comment: (NOTE) The Xpert SA Assay (FDA approved for NASAL specimens in patients 72 years of age and older), is one component of a comprehensive surveillance program. It is not intended to diagnose infection nor to guide or monitor treatment. Performed at [facility], [place] [address], [place], [place] [address]     Procedures and diagnostic studies:  Pelvis Portable  Result Date: 02/17/2019 CLINICAL DATA:  Postop day 0 ORIF comminuted intertrochanteric RIGHT femoral neck fracture EXAM: PORTABLE PELVIS 1-2 VIEWS COMPARISON:  02/15/2019. FINDINGS: ORIF of the comminuted intertrochanteric RIGHT femoral neck fracture with an intramedullary nail and compression screw. Marked improvement in alignment post ORIF. No acute complicating features. Mild osseous demineralization. Contralateral LEFT hip joint with relatively well-preserved joint space. IMPRESSION: 1. Marked improvement in alignment post ORIF of the comminuted  intertrochanteric RIGHT femoral neck fracture. No acute complicating features. 2. Mild osseous demineralization. Electronically Signed   By: Evangeline Dakin M.D.   On: 02/17/2019 12:08   Dg C-arm 1-60 Min  Result Date: 02/17/2019 CLINICAL DATA:  IM nail RIGHT femur EXAM: DG C-ARM 1-60 MIN; RIGHT FEMUR 2 VIEWS COMPARISON:  None. FINDINGS: Four intraoperative fluoroscopic images are provided demonstrating IM nail placement within the RIGHT femur. Hardware appears intact and appropriately positioned. Osseous alignment is anatomic. Fluoroscopy provided for 1 minutes 58 seconds. IMPRESSION: Intraoperative fluoroscopic images demonstrating intramedullary nail placement within the RIGHT femur. No evidence of surgical complicating feature. Electronically Signed   By: Franki Cabot M.D.   On: 02/17/2019 09:55   Dg Femur, Min 2 Views Right  Result Date: 02/17/2019 CLINICAL DATA:  IM nail RIGHT femur EXAM: DG C-ARM 1-60 MIN; RIGHT FEMUR 2 VIEWS COMPARISON:  None. FINDINGS: Four intraoperative fluoroscopic images are provided demonstrating IM nail placement within the RIGHT femur.  Hardware appears intact and appropriately positioned. Osseous alignment is anatomic. Fluoroscopy provided for 1 minutes 58 seconds. IMPRESSION: Intraoperative fluoroscopic images demonstrating intramedullary nail placement within the RIGHT femur. No evidence of surgical complicating feature. Electronically Signed   By: Franki Cabot M.D.   On: 02/17/2019 09:55   Dg Femur Port, Min 2 Views Right  Result Date: 02/17/2019 CLINICAL DATA:  Postop day 0 of ORIF of a comminuted intertrochanteric RIGHT femoral neck fracture. EXAM: RIGHT FEMUR PORTABLE 2 VIEW 10:35 a.m.: COMPARISON:  Intraoperative RIGHT femur x-rays earlier same day at 9:25 a.m. RIGHT hip x-rays 02/15/2019. FINDINGS: Marked improvement in alignment post ORIF of the comminuted intertrochanteric RIGHT femoral neck fracture with intramedullary nail and compression screw. No acute  complicating features. IMPRESSION: Marked improvement in alignment post ORIF of the comminuted intertrochanteric RIGHT femoral neck fracture without acute complicating features. Electronically Signed   By: Evangeline Dakin M.D.   On: 02/17/2019 12:09    Medications:    [START ON 02/18/2019] apixaban  2.5 mg Oral BID   atorvastatin  10 mg Oral Daily   docusate sodium  100 mg Oral BID   feeding supplement (ENSURE ENLIVE)  237 mL Oral BID BM   fentaNYL       gabapentin  300 mg Oral QHS   multivitamin with minerals  1 tablet Oral Daily   pantoprazole  40 mg Oral Daily   Continuous Infusions:   ceFAZolin (ANCEF) IV     methocarbamol (ROBAXIN) IV       LOS: 2 days   Lasean Gorniak  [facility]   *Please refer to [email or web address], password [email or web address] to get updated schedule on who will round on this patient, as hospitalists switch teams weekly. If 7PM-7AM, please contact night-coverage at [email or web address], password [email or web address] for any overnight needs.  02/17/2019, 4:01 PM

## 2019-02-17 NOTE — Anesthesia Procedure Notes (Signed)
Procedure Name: Intubation Date/Time: 02/17/2019 8:18 AM Performed by: Lance Coon, CRNA Pre-anesthesia Checklist: Patient identified, Emergency Drugs available, Suction available, Patient being monitored and Timeout performed Patient Re-evaluated:Patient Re-evaluated prior to induction Oxygen Delivery Method: Circle system utilized Preoxygenation: Pre-oxygenation with 100% oxygen Induction Type: IV induction and Rapid sequence Laryngoscope Size: Miller and 2 Grade View: Grade I Tube type: Oral Tube size: 7.0 mm Number of attempts: 1 Airway Equipment and Method: Stylet Placement Confirmation: ETT inserted through vocal cords under direct vision,  positive ETCO2 and breath sounds checked- equal and bilateral Secured at: 21 cm Tube secured with: Tape Dental Injury: Teeth and Oropharynx as per pre-operative assessment

## 2019-02-17 NOTE — Progress Notes (Signed)
PHARMACY NOTE:  ANTIMICROBIAL RENAL DOSAGE ADJUSTMENT  Current antimicrobial regimen includes a mismatch between antimicrobial dosage and estimated renal function.  As per policy approved by the Pharmacy & Therapeutics and Medical Executive Committees, the antimicrobial dosage will be adjusted accordingly.  Current antimicrobial dosage:  Cefazolin 2 g IV q6h x 2 doses  Indication: surgical prophyalxis  Renal Function: CrCl 28.5 ml/min  Estimated Creatinine Clearance: 28.5 mL/min (A) (by C-G formula based on SCr of 1.08 mg/dL (H)). []      On intermittent HD, scheduled: []      On CRRT    Antimicrobial dosage has been changed to:  Cefazolin 2 g IV q12h  Additional comments:  Thank you for allowing pharmacy to be a part of this patient's care.  Lorel Monaco, PharmD PGY1 Ambulatory Care Resident [employer] # [phone]

## 2019-02-17 NOTE — Plan of Care (Signed)

## 2019-02-17 NOTE — Op Note (Signed)
OPERATIVE REPORT  SURGEON: Rod Can, MD   ASSISTANT: Staff.  PREOPERATIVE DIAGNOSIS: Right intertrochanteric femur fracture.   POSTOPERATIVE DIAGNOSIS: Right intertrochanteric femur fracture.   PROCEDURE: Intramedullary fixation, Right femur.   IMPLANTS: Stryker Gamma3 Hip Fracture Nail, 11 by 400 mm, 125 degrees. 10.5 x 100 mm Hip Fracture Nail Lag Screw. 5 x 42.5 mm distal interlocking screw 1.  ANESTHESIA:  General  ESTIMATED BLOOD LOSS:-100 mL    ANTIBIOTICS: 2 g Ancef.  DRAINS: None.  COMPLICATIONS: None.   CONDITION: PACU - hemodynamically stable.   BRIEF CLINICAL NOTE: Hannah Mercado is a 85 y.o. female who presented with a comminuted reverse obliquity intertrochanteric femur fracture. The patient was admitted to the hospitalist service and underwent perioperative risk stratification and medical optimization. [facility] to Eliquis usage, we waited 48 hours. The risks, benefits, and alternatives to the procedure were explained, and the patient elected to proceed.  PROCEDURE IN DETAIL: Surgical site was marked by myself. The patient was taken to the operating room and anesthesia was induced on the bed. The patient was then transferred to the [facility] table and the nonoperative lower extremity was scissored underneath the operative side. The fracture was reduced with traction, internal rotation, and adduction. The hip was prepped and draped in the normal sterile surgical fashion. Timeout was called verifying side and site of surgery. Preop antibiotics were given with 60 minutes of beginning the procedure.  Fluoroscopy was used to define the patient's anatomy. A 4 cm incision was made just proximal to the tip of the greater trochanter. The awl was used to obtain the standard starting point for a trochanteric entry nail under fluoroscopic control. The guidepin was placed. The entry reamer was used to open the proximal femur. Accessory longitudinal incision was made over the  anterior trochanter to allow for placement of a subperiosteal bone hook, which improved the reduction.  I placed the guidewire to the level of the physeal scar of the knee. I measured the length of the guidewire. Sequential reaming was performed up to a size 12.5 mm with excellent chatter. Therefore, a size 11 by 400 mm nail was selected and assembled to the jig on the back table. The nail was placed without any difficulty. Through a separate stab incision, the cannula was placed down to the bone in preparation for the cephalomedullary device. A guidepin was placed into the femoral head using AP and lateral fluoroscopy views. The pin was measured, and then reaming was performed to the appropriate depth. The lag screw was inserted to the appropriate depth. The fracture was compressed through the jig. The setscrew was tightened statically. Using perfect circle technique, a distal interlocking screw was placed. The jig was removed. Final AP and lateral fluoroscopy views were obtained to confirm fracture reduction and hardware placement. Tip apex distance was appropriate. There was no chondral penetration.  The wounds were copiously irrigated with saline. The wound was closed in layers with #1 Vicryl for the fascia, 2-0 Monocryl for the deep dermal layer, and 3-0 Monocryl subcuticular stitch. Glue was applied to the skin. Once the glue was fully hardened, sterile dressing was applied. The patient was then awakened from anesthesia and taken to the PACU in stable condition. Sponge needle and instrument counts were correct at the end of the case 2. There were no known complications.  We will readmit the patient to the hospitalist. Weightbearing status will be weightbearing as tolerated with a walker. We will resume Eliquis at 2.5 mg PO BID for  48 hours and then resume home dose for DVT prophylaxis. The patient will work with physical therapy and undergo disposition planning.

## 2019-02-17 NOTE — Interval H&P Note (Signed)
History and Physical Interval Note:  02/17/2019 8:01 AM  Hannah Mercado  has presented today for surgery, with the diagnosis of right displaced intertrochanteric fracture.  The various methods of treatment have been discussed with the patient and family. After consideration of risks, benefits and other options for treatment, the patient has consented to  Procedure(s): INTRAMEDULLARY (IM) NAIL FEMORAL (Right) as a surgical intervention.  The patient's history has been reviewed, patient examined, no change in status, stable for surgery.  I have reviewed the patient's chart and labs.  Questions were answered to the patient's satisfaction.     Hilton Cork Tabatha Razzano

## 2019-02-17 NOTE — Plan of Care (Signed)

## 2019-02-18 LAB — BASIC METABOLIC PANEL
Anion gap: 11 (ref 5–15)
BUN: 36 mg/dL — ABNORMAL HIGH (ref 8–23)
CO2: 27 mmol/L (ref 22–32)
Calcium: 8.6 mg/dL — ABNORMAL LOW (ref 8.9–10.3)
Chloride: 97 mmol/L — ABNORMAL LOW (ref 98–111)
Creatinine, Ser: 1.05 mg/dL — ABNORMAL HIGH (ref 0.44–1.00)
GFR calc Af Amer: 53 mL/min — ABNORMAL LOW (ref >60)
GFR calc non Af Amer: 45 mL/min — ABNORMAL LOW (ref >60)
Glucose, Bld: 167 mg/dL — ABNORMAL HIGH (ref 70–99)
Potassium: 5.1 mmol/L (ref 3.5–5.1)
Sodium: 135 mmol/L (ref 135–145)

## 2019-02-18 LAB — CBC
HCT: 26.7 % — ABNORMAL LOW (ref 36.0–46.0)
HCT: 27.8 % — ABNORMAL LOW (ref 36.0–46.0)
Hemoglobin: 8.7 g/dL — ABNORMAL LOW (ref 12.0–15.0)
Hemoglobin: 9.2 g/dL — ABNORMAL LOW (ref 12.0–15.0)
MCH: 33.9 pg (ref 26.0–34.0)
MCH: 34.1 pg — ABNORMAL HIGH (ref 26.0–34.0)
MCHC: 32.6 g/dL (ref 30.0–36.0)
MCHC: 33.1 g/dL (ref 30.0–36.0)
MCV: 103.9 fL — ABNORMAL HIGH (ref 80.0–100.0)
MCV: 103 fL — ABNORMAL HIGH (ref 80.0–100.0)
Platelets: 181 10*3/uL (ref 150–400)
Platelets: 232 10*3/uL (ref 150–400)
RBC: 2.57 MIL/uL — ABNORMAL LOW (ref 3.87–5.11)
RBC: 2.7 MIL/uL — ABNORMAL LOW (ref 3.87–5.11)
RDW: 13 % (ref 11.5–15.5)
RDW: 13.2 % (ref 11.5–15.5)
WBC: 11.5 10*3/uL — ABNORMAL HIGH (ref 4.0–10.5)
WBC: 12.9 10*3/uL — ABNORMAL HIGH (ref 4.0–10.5)
nRBC: 0 % (ref 0.0–0.2)
nRBC: 0 % (ref 0.0–0.2)

## 2019-02-18 MED ORDER — AMLODIPINE BESYLATE 2.5 MG PO TABS
2.5000 mg | ORAL_TABLET | Freq: Every day | ORAL | Status: DC
  Administered 2019-02-18 – 2019-02-19 (×2): 2.5 mg via ORAL
  Filled 2019-02-18 (×2): qty 1

## 2019-02-18 NOTE — Progress Notes (Signed)
Rehab Admissions Coordinator Note:  Per PT recommendation, patient was screened by Michel Santee for appropriateness for an Inpatient Acute Rehab Consult.  At this time, we are recommending Inpatient Rehab consult.  I will place an order so we can better evaluate pt's candidacy for CIR.   Michel Santee 02/18/2019, 3:40 PM  I can be reached at [phone].

## 2019-02-18 NOTE — Plan of Care (Signed)

## 2019-02-18 NOTE — Plan of Care (Signed)

## 2019-02-18 NOTE — Progress Notes (Signed)
Subjective: 1 Day Post-Op Procedure(s) (LRB): INTRAMEDULLARY (IM) NAIL FEMORAL (Right) Patient reports pain as mild.   Patient seen in rounds for Dr. Lyla Glassing. Patient is well, and has had no acute complaints or problems other than soreness in her right hip. She denies SOB and chest pain. Reports that she is voiding well and positive flatus.  Plan is to go Skilled nursing facility after hospital stay.  Objective: Vital signs in last 24 hours: Temp:  [97.7 F (36.5 C)-98.4 F (36.9 C)] 97.8 F (36.6 C) (11/29 0436) Pulse Rate:  [60-67] 67 (11/29 0436) Resp:  [11-19] 18 (11/29 0436) BP: (117-139)/(52-76) 129/56 (11/29 0436) SpO2:  [90 %-100 %] 97 % (11/29 0436)  Intake/Output from previous day:  Intake/Output Summary (Last 24 hours) at 02/18/2019 0758 Last data filed at 02/17/2019 2245 Gross per 24 hour  Intake 1490 ml  Output 550 ml  Net 940 ml     Labs: Recent Labs    02/15/19 0945 02/16/19 0407 02/17/19 0313 02/17/19 1144 02/18/19 0204  HGB 11.9* 9.8* 9.6* 9.8* 8.7*   Recent Labs    02/17/19 1144 02/18/19 0204  WBC 14.2* 11.5*  RBC 2.87* 2.57*  HCT 30.3* 26.7*  PLT 191 181   Recent Labs    02/16/19 0407 02/17/19 1144 02/18/19 0204  NA 140  --  135  K 4.4  --  5.1  CL 104  --  97*  CO2 26  --  27  BUN 20  --  36*  CREATININE 1.08* 0.97 1.05*  GLUCOSE 107*  --  167*  CALCIUM 8.3*  --  8.6*   Recent Labs    02/15/19 0945  INR 1.1    EXAM General - Patient is Alert and Oriented Extremity - Neurologically intact Intact pulses distally Dorsiflexion/Plantar flexion intact No cellulitis present Compartment soft Dressing/Incision - clean, dry, no drainage Motor Function - intact, moving foot and toes well on exam.   Past Medical History:  Diagnosis Date  . Allergy history unknown   . Anxiety   . Asthmatic bronchitis    use inhaler prn   . Atrial fibrillation ([place]) 10/16/2015  . Blood transfusion    ? with ectopic pregnancy 50 yrs ago    . Cardiac pacemaker in situ 02/24/2016  . Carotid artery disease ([place]) 03/12/2015  . Chronic venous insufficiency 09/11/2015  . Diaphragmatic hernia 07/31/2008   Qualifier: Diagnosis of  By: Lenna Gilford MD, Deborra Medina   . Diverticulosis of colon without hemorrhage 08/02/2012  . DJD (degenerative joint disease)   . Edema 09/11/2015  . GERD (gastroesophageal reflux disease)   . Hiatal hernia   . History of pneumonia    right middle lobe  . Hypercholesteremia   . HYPERCHOLESTEROLEMIA 04/19/2007   Qualifier: Diagnosis of  By: Julien Girt CMA, Marliss Czar    . Hypertension 04/14/2011  . Mitral valve disorder 04/20/2007   Qualifier: Diagnosis of  By: Lenna Gilford MD, Deborra Medina   . Osteopenia   . Persistent atrial fibrillation ([place])   . PULMONARY FIBROSIS, POSTINFLAMMATORY 07/31/2008   Qualifier: Diagnosis of  By: Lenna Gilford MD, Deborra Medina   . Rectal cancer ([place])   . Right carotid bruit 08/28/2014    Assessment/Plan: 1 Day Post-Op Procedure(s) (LRB): INTRAMEDULLARY (IM) NAIL FEMORAL (Right) Principal Problem:   Closed hip fracture, right, initial encounter [facility]) Active Problems:   HYPERCHOLESTEROLEMIA   PULMONARY FIBROSIS, POSTINFLAMMATORY   pT2pN0 (0/12 lymph nodes) cM0 Rectal Cancer   Hypertension   Carotid artery disease ([place])  Persistent atrial fibrillation ([facility])   Cardiac pacemaker in situ   Stage 3a chronic kidney disease   Protein-calorie malnutrition, severe  Estimated body mass index is 19.58 kg/m as calculated from the following:   Height as of this encounter: 5\' 7"  (1.702 m).   Weight as of this encounter: 56.7 kg. Advance diet Up with therapy Discharge to SNF when stable  DVT Prophylaxis - Eliquis Weight-Bearing as tolerated to *  We will have her get up with therapy today. WBAT with a walker. Eliquis 2.5 BID for 2 days then resume home dose for DVT prophylaxis. Will likely need SNF placement but will let medicine manage discharge disposition.   Ardeen Jourdain, PA-C Orthopaedic Surgery 02/18/2019,  7:58 AM

## 2019-02-18 NOTE — Evaluation (Addendum)
Physical Therapy Evaluation Patient Details Name: Hannah Mercado MRN: [id number] DOB: 1922-12-28 Today's Date: 02/18/2019   History of Present Illness  Pt is a 85 y.o. female s/p fall at home resulting in displaced, comminuted right intertrochanteric femur fracture.  Pt underwent IM nail right femur on 02/17/19.  PMH signficant for atrial fibrillation on Eliquis, pulmonary fibrosis, coronary artery disease, hyperlipidemia, rectal cancer, status post pacemaker placement and peripheral neuropathy.  Clinical Impression  Patient presents with dependencies in bed mobility and mobility, limited by generalized weakness, pain and peripheral neuropathy.  Feel patient will make steady gains.  Her goal is to go back to her home with full to intermittent assistance.  Recommend CIR to help patient reach maximum potential for return home.  PT Will follow acutely while in hospital. Spent extensive time with son and son-in-law (via phone) explaining patient's injury, the surgery performed and ramifications on her mobility and recovery.  We discussed discharge planning the care she would need at d/c.  I also encouraged patient and family to discuss safety of patient driving with her peripheral neuropathy.       Follow Up Recommendations CIR    Equipment Recommendations  Rolling walker with 5" wheels    Recommendations for Other Services Rehab consult     Precautions / Restrictions Precautions Precautions: Fall Restrictions Weight Bearing Restrictions: Yes RLE Weight Bearing: Weight bearing as tolerated      Mobility  Bed Mobility Overal bed mobility: Needs Assistance Bed Mobility: Sit to Supine       Sit to supine: Mod assist   General bed mobility comments: for RLE and to help lower shoulders to bed  Transfers Overall transfer level: Needs assistance Equipment used: Rolling walker (2 wheeled) Transfers: Sit to/from Stand Sit to Stand: Mod assist         General transfer  comment: assist to power up  Ambulation/Gait Ambulation/Gait assistance: Mod assist Gait Distance (Feet): 5 Feet Assistive device: Rolling walker (2 wheeled) Gait Pattern/deviations: Step-to pattern;Antalgic;Decreased weight shift to right;Shuffle Gait velocity: decreased   General Gait Details: patient only took a few steps back to bed  Stairs            Wheelchair Mobility    Modified Rankin (Stroke Patients Only)       Balance Overall balance assessment: Needs assistance Sitting-balance support: Bilateral upper extremity supported;Feet supported Sitting balance-Leahy Scale: Fair     Standing balance support: Bilateral upper extremity supported;During functional activity Standing balance-Leahy Scale: Poor Standing balance comment: reliant on RW for balance                             Pertinent Vitals/Pain Pain Assessment: Faces Faces Pain Scale: Hurts whole lot Pain Location: RLE Pain Descriptors / Indicators: Discomfort;Grimacing    Home Living Family/patient expects to be discharged to:: Private residence Living Arrangements: Alone Available Help at Discharge: Family;Available 24 hours/day(daughter and son-in-law planning to come and stay with patient) Type of Home: House(townhome, 2 level with elevator) Home Access: Stairs to enter   Entrance Stairs-Number of Steps: 1 Home Layout: Two level Home Equipment: Cane - single point      Prior Function Level of Independence: Independent with assistive device(s)         Comments: uses cane when out of house     Hand Dominance        Extremity/Trunk Assessment        Lower Extremity Assessment Lower Extremity  Assessment: Generalized weakness       Communication   Communication: No difficulties  Cognition Arousal/Alertness: Awake/alert Behavior During Therapy: WFL for tasks assessed/performed Overall Cognitive Status: Within Functional Limits for tasks assessed                                         General Comments      Exercises     Assessment/Plan    PT Assessment Patient needs continued PT services  PT Problem List Decreased strength;Decreased activity tolerance;Decreased balance;Decreased knowledge of use of DME;Decreased mobility       PT Treatment Interventions DME instruction;Gait training;Stair training;Functional mobility training;Therapeutic activities;Therapeutic exercise;Balance training;Patient/family education    PT Goals (Current goals can be found in the Care Plan section)  Acute Rehab PT Goals Patient Stated Goal: go back home PT Goal Formulation: With patient/family Time For Goal Achievement: 03/05/19 Potential to Achieve Goals: Good    Frequency Min 4X/week   Barriers to discharge   need to ensure she has assistance at home    Co-evaluation               AM-PAC PT "6 Clicks" Mobility  Outcome Measure Help needed turning from your back to your side while in a flat bed without using bedrails?: A Lot Help needed moving from lying on your back to sitting on the side of a flat bed without using bedrails?: A Lot Help needed moving to and from a bed to a chair (including a wheelchair)?: A Lot Help needed standing up from a chair using your arms (e.g., wheelchair or bedside chair)?: A Lot Help needed to walk in hospital room?: A Little Help needed climbing 3-5 steps with a railing? : A Lot 6 Click Score: 13    End of Session Equipment Utilized During Treatment: Gait belt Activity Tolerance: Patient limited by fatigue;Patient limited by pain Patient left: in bed;with call bell/phone within reach;with bed alarm set;with family/visitor present   PT Visit Diagnosis: Difficulty in walking, not elsewhere classified (R26.2);Muscle weakness (generalized) (M62.81);History of falling (Z91.81)    Time: 1100-1156 PT Time Calculation (min) (ACUTE ONLY): 56 min   Charges:   PT Evaluation $PT Eval Moderate  Complexity: 1 Mod PT Treatments $Self Care/Home Management: 23-37        02/18/2019 [place], PT Acute Rehabilitation Services Pager:  [phone] Office:  [phone]    Shanna Cisco 02/18/2019, 12:57 PM

## 2019-02-18 NOTE — Progress Notes (Signed)
PROGRESS NOTE    Hannah Mercado  NFA:[id number] DOB: 1922/10/07 DOA: 02/15/2019 PCP: Patient, No Pcp Per   Brief Narrative:  Hannah Mercado is an 85 y.o. female with medical history significant ofrectal cancer, atrial fibrillation on Eliquis, hypertension, hyperlipidemia, CAD, pulmonary fibrosis, permanent pacemaker in place, presented with a fall, she was found to have right displaced intertrochanteric fracture on xray, Ortho has been consulted, she underwent Intramedullary nail placement in femur, tolerated well, No complication, Anticipated discharge to SNF for rehabilitation.   Assessment & Plan:   Principal Problem:   Closed hip fracture, right, initial encounter ([place]) Active Problems:   HYPERCHOLESTEROLEMIA   PULMONARY FIBROSIS, POSTINFLAMMATORY   pT2pN0 (0/12 lymph nodes) cM0 Rectal Cancer   Hypertension   Carotid artery disease (HCC)   Persistent atrial fibrillation (HCC)   Cardiac pacemaker in situ   Stage 3a chronic kidney disease   Protein-calorie malnutrition, severe   # Right intertrochanteric femur fracture, s/p fall: - s/p right intermedullary nail femur.post op day # 2,  -  Analgesics as needed for pain.     - Follow-up with orthopedic surgeon. -  Possible DC in rehab    # Hypertension:  Resume amlodipine, moniter BP  # Leukocytosis: This is likely reactive from recent surgery.  Monitor CBC.  # CAD/ atrial fibrillation/permanent pacemaker in place: Eliquis resumed.  # Osteoporosis: Outpatient follow-up with PCP  # Pulmonary fibrosis/pulmonary hypertension: No acute pulmonary issues at this time.  # Stage III CKD: Creatinine is stable.  Severe protein calorie malnutrition: Continue nutritional supplements.  Follow-up with dietitian.  DVT prophylaxis:  Eliquis Code Status: Full Family Communication: Discussed with patient. Disposition Plan:  Likely SNF  Consultants:   Orthopaedics.  Procedures:  Antimicrobials:    Anti-infectives (From admission, onward)   Start     Dose/Rate Route Frequency Ordered Stop   02/17/19 2030  ceFAZolin (ANCEF) IVPB 2g/100 mL premix     2 g 200 mL/hr over 30 Minutes Intravenous Every 12 hours 02/17/19 1127 02/18/19 1011   02/17/19 0645  ceFAZolin (ANCEF) IVPB 2g/100 mL premix     2 g 200 mL/hr over 30 Minutes Intravenous On call to O.R. 02/17/19 4696 02/17/19 0824   02/17/19 0642  ceFAZolin (ANCEF) 2-4 GM/100ML-% IVPB    Note to Pharmacy: Henrine Screws   : cabinet override      02/17/19 0642 02/17/19 0824   02/16/19 0600  ceFAZolin (ANCEF) IVPB 2g/100 mL premix     2 g 200 mL/hr over 30 Minutes Intravenous On call to O.R. 02/15/19 1216 02/17/19 0559      Subjective: Patient was seen and examined at bedside, she c/o mild pain on right hip and thigh but manageable.    Objective: Vitals:   02/17/19 1913 02/17/19 2023 02/18/19 0004 02/18/19 0436  BP:  (!) 128/57 119/65 (!) 129/56  Pulse:  63 64 67  Resp:  18 18 18   Temp:  98.3 F (36.8 C) 98.4 F (36.9 C) 97.8 F (36.6 C)  TempSrc:  Oral Oral Oral  SpO2: 98% 98% 96% 97%  Weight:      Height:        Intake/Output Summary (Last 24 hours) at 02/18/2019 1557 Last data filed at 02/18/2019 1104 Gross per 24 hour  Intake 800 ml  Output 450 ml  Net 350 ml   Filed Weights   02/15/19 0935  Weight: 56.7 kg    Examination:  General exam: Appears calm and comfortable  Respiratory system: Clear to  auscultation. Respiratory effort normal. Cardiovascular system: S1 & S2 heard, RRR. No JVD, murmurs, rubs, gallops or clicks. No pedal edema. Gastrointestinal system: Abdomen is nondistended, soft and nontender. No organomegaly or masses felt. Normal bowel sounds heard. Central nervous system: Alert and oriented. No focal neurological deficits. Extremities:  No pedal edema, No calf tenderness.  Mild thigh tenderness. Skin: No rashes, lesions or ulcers Psychiatry: Judgement and insight appear normal. Mood & affect  appropriate.     Data Reviewed: I have personally reviewed following labs and imaging studies  CBC: Recent Labs  Lab 02/15/19 0945 02/16/19 0407 02/17/19 0313 02/17/19 1144 02/18/19 0204 02/18/19 1026  WBC 15.6* 6.9 9.2 14.2* 11.5* 12.9*  NEUTROABS 13.6*  --  6.9  --   --   --   HGB 11.9* 9.8* 9.6* 9.8* 8.7* 9.2*  HCT 37.9 30.9* 28.8* 30.3* 26.7* 27.8*  MCV 108.3* 106.6* 103.2* 105.6* 103.9* 103.0*  PLT 226 175 185 191 181 974   Basic Metabolic Panel: Recent Labs  Lab 02/15/19 0945 02/16/19 0407 02/17/19 1144 02/18/19 0204  NA 139 140  --  135  K 4.5 4.4  --  5.1  CL 105 104  --  97*  CO2 25 26  --  27  GLUCOSE 156* 107*  --  167*  BUN 15 20  --  36*  CREATININE 1.05* 1.08* 0.97 1.05*  CALCIUM 8.6* 8.3*  --  8.6*   GFR: Estimated Creatinine Clearance: 29.3 mL/min (A) (by C-G formula based on SCr of 1.05 mg/dL (H)). Liver Function Tests: No results for input(s): AST, ALT, ALKPHOS, BILITOT, PROT, ALBUMIN in the last 168 hours. No results for input(s): LIPASE, AMYLASE in the last 168 hours. No results for input(s): AMMONIA in the last 168 hours. Coagulation Profile: Recent Labs  Lab 02/15/19 0945  INR 1.1   Cardiac Enzymes: No results for input(s): CKTOTAL, CKMB, CKMBINDEX, TROPONINI in the last 168 hours. BNP (last 3 results) No results for input(s): PROBNP in the last 8760 hours. HbA1C: No results for input(s): HGBA1C in the last 72 hours. CBG: No results for input(s): GLUCAP in the last 168 hours. Lipid Profile: No results for input(s): CHOL, HDL, LDLCALC, TRIG, CHOLHDL, LDLDIRECT in the last 72 hours. Thyroid Function Tests: No results for input(s): TSH, T4TOTAL, FREET4, T3FREE, THYROIDAB in the last 72 hours. Anemia Panel: No results for input(s): VITAMINB12, FOLATE, FERRITIN, TIBC, IRON, RETICCTPCT in the last 72 hours. Sepsis Labs: No results for input(s): PROCALCITON, LATICACIDVEN in the last 168 hours.  Recent Results (from the past 240  hour(s))  Surgical pcr screen     Status: None   Collection Time: 02/15/19  3:24 PM   Specimen: Nasal Mucosa; Nasal Swab  Result Value Ref Range Status   MRSA, PCR NEGATIVE NEGATIVE Final   Staphylococcus aureus NEGATIVE NEGATIVE Final    Comment: (NOTE) The Xpert SA Assay (FDA approved for NASAL specimens in patients 58 years of age and older), is one component of a comprehensive surveillance program. It is not intended to diagnose infection nor to guide or monitor treatment. Performed at [facility], [place] [address], [place], [place] [address]      Radiology Studies: Pelvis Portable  Result Date: 02/17/2019 CLINICAL DATA:  Postop day 0 ORIF comminuted intertrochanteric RIGHT femoral neck fracture EXAM: PORTABLE PELVIS 1-2 VIEWS COMPARISON:  02/15/2019. FINDINGS: ORIF of the comminuted intertrochanteric RIGHT femoral neck fracture with an intramedullary nail and compression screw. Marked improvement in alignment post ORIF. No acute  complicating features. Mild osseous demineralization. Contralateral LEFT hip joint with relatively well-preserved joint space. IMPRESSION: 1. Marked improvement in alignment post ORIF of the comminuted intertrochanteric RIGHT femoral neck fracture. No acute complicating features. 2. Mild osseous demineralization. Electronically Signed   By: Evangeline Dakin M.D.   On: 02/17/2019 12:08   Dg C-arm 1-60 Min  Result Date: 02/17/2019 CLINICAL DATA:  IM nail RIGHT femur EXAM: DG C-ARM 1-60 MIN; RIGHT FEMUR 2 VIEWS COMPARISON:  None. FINDINGS: Four intraoperative fluoroscopic images are provided demonstrating IM nail placement within the RIGHT femur. Hardware appears intact and appropriately positioned. Osseous alignment is anatomic. Fluoroscopy provided for 1 minutes 58 seconds. IMPRESSION: Intraoperative fluoroscopic images demonstrating intramedullary nail placement within the RIGHT femur. No evidence of surgical complicating feature. Electronically Signed    By: Franki Cabot M.D.   On: 02/17/2019 09:55   Dg Femur, Min 2 Views Right  Result Date: 02/17/2019 CLINICAL DATA:  IM nail RIGHT femur EXAM: DG C-ARM 1-60 MIN; RIGHT FEMUR 2 VIEWS COMPARISON:  None. FINDINGS: Four intraoperative fluoroscopic images are provided demonstrating IM nail placement within the RIGHT femur. Hardware appears intact and appropriately positioned. Osseous alignment is anatomic. Fluoroscopy provided for 1 minutes 58 seconds. IMPRESSION: Intraoperative fluoroscopic images demonstrating intramedullary nail placement within the RIGHT femur. No evidence of surgical complicating feature. Electronically Signed   By: Franki Cabot M.D.   On: 02/17/2019 09:55   Dg Femur Port, Min 2 Views Right  Result Date: 02/17/2019 CLINICAL DATA:  Postop day 0 of ORIF of a comminuted intertrochanteric RIGHT femoral neck fracture. EXAM: RIGHT FEMUR PORTABLE 2 VIEW 10:35 a.m.: COMPARISON:  Intraoperative RIGHT femur x-rays earlier same day at 9:25 a.m. RIGHT hip x-rays 02/15/2019. FINDINGS: Marked improvement in alignment post ORIF of the comminuted intertrochanteric RIGHT femoral neck fracture with intramedullary nail and compression screw. No acute complicating features. IMPRESSION: Marked improvement in alignment post ORIF of the comminuted intertrochanteric RIGHT femoral neck fracture without acute complicating features. Electronically Signed   By: Evangeline Dakin M.D.   On: 02/17/2019 12:09        Scheduled Meds:  apixaban  2.5 mg Oral BID   atorvastatin  10 mg Oral Daily   docusate sodium  100 mg Oral BID   feeding supplement (ENSURE ENLIVE)  237 mL Oral BID BM   gabapentin  300 mg Oral QHS   multivitamin with minerals  1 tablet Oral Daily   pantoprazole  40 mg Oral Daily   Continuous Infusions:  methocarbamol (ROBAXIN) IV       LOS: 3 days    Time spent: [place], MD [facility]   If 7PM-7AM, please contact night-coverage

## 2019-02-19 ENCOUNTER — Encounter (HOSPITAL_COMMUNITY): Payer: Self-pay | Admitting: Orthopedic Surgery

## 2019-02-19 ENCOUNTER — Other Ambulatory Visit: Payer: Self-pay

## 2019-02-19 ENCOUNTER — Inpatient Hospital Stay (HOSPITAL_COMMUNITY)
Admission: RE | Admit: 2019-02-19 | Discharge: 2019-02-20 | DRG: 560 | Disposition: A | Discharge status: Other Acute Inpatient Hospital | Payer: Medicare Other | Source: Intra-hospital | Attending: Physical Medicine & Rehabilitation | Admitting: Physical Medicine & Rehabilitation

## 2019-02-19 DIAGNOSIS — Z85048 Personal history of other malignant neoplasm of rectum, rectosigmoid junction, and anus: Secondary | ICD-10-CM | POA: Diagnosis not present

## 2019-02-19 DIAGNOSIS — I10 Essential (primary) hypertension: Secondary | ICD-10-CM

## 2019-02-19 DIAGNOSIS — E785 Hyperlipidemia, unspecified: Secondary | ICD-10-CM | POA: Diagnosis present

## 2019-02-19 DIAGNOSIS — W1830XD Fall on same level, unspecified, subsequent encounter: Secondary | ICD-10-CM

## 2019-02-19 DIAGNOSIS — Z950 Presence of cardiac pacemaker: Secondary | ICD-10-CM

## 2019-02-19 DIAGNOSIS — I4819 Other persistent atrial fibrillation: Secondary | ICD-10-CM | POA: Diagnosis present

## 2019-02-19 DIAGNOSIS — I129 Hypertensive chronic kidney disease with stage 1 through stage 4 chronic kidney disease, or unspecified chronic kidney disease: Secondary | ICD-10-CM | POA: Diagnosis present

## 2019-02-19 DIAGNOSIS — R1110 Vomiting, unspecified: Secondary | ICD-10-CM

## 2019-02-19 DIAGNOSIS — E7800 Pure hypercholesterolemia, unspecified: Secondary | ICD-10-CM | POA: Diagnosis present

## 2019-02-19 DIAGNOSIS — D62 Acute posthemorrhagic anemia: Secondary | ICD-10-CM

## 2019-02-19 DIAGNOSIS — J8410 Pulmonary fibrosis, unspecified: Secondary | ICD-10-CM | POA: Diagnosis present

## 2019-02-19 DIAGNOSIS — S72141D Displaced intertrochanteric fracture of right femur, subsequent encounter for closed fracture with routine healing: Principal | ICD-10-CM

## 2019-02-19 DIAGNOSIS — Z7901 Long term (current) use of anticoagulants: Secondary | ICD-10-CM

## 2019-02-19 DIAGNOSIS — S72009A Fracture of unspecified part of neck of unspecified femur, initial encounter for closed fracture: Secondary | ICD-10-CM | POA: Diagnosis present

## 2019-02-19 DIAGNOSIS — K5900 Constipation, unspecified: Secondary | ICD-10-CM | POA: Diagnosis present

## 2019-02-19 DIAGNOSIS — K56609 Unspecified intestinal obstruction, unspecified as to partial versus complete obstruction: Secondary | ICD-10-CM | POA: Diagnosis not present

## 2019-02-19 DIAGNOSIS — S72009S Fracture of unspecified part of neck of unspecified femur, sequela: Secondary | ICD-10-CM

## 2019-02-19 DIAGNOSIS — E875 Hyperkalemia: Secondary | ICD-10-CM | POA: Diagnosis present

## 2019-02-19 DIAGNOSIS — N183 Chronic kidney disease, stage 3 unspecified: Secondary | ICD-10-CM | POA: Diagnosis present

## 2019-02-19 DIAGNOSIS — K219 Gastro-esophageal reflux disease without esophagitis: Secondary | ICD-10-CM | POA: Diagnosis present

## 2019-02-19 DIAGNOSIS — Z79899 Other long term (current) drug therapy: Secondary | ICD-10-CM | POA: Diagnosis not present

## 2019-02-19 LAB — CBC
HCT: 29.3 % — ABNORMAL LOW (ref 36.0–46.0)
Hemoglobin: 9.7 g/dL — ABNORMAL LOW (ref 12.0–15.0)
MCH: 34 pg (ref 26.0–34.0)
MCHC: 33.1 g/dL (ref 30.0–36.0)
MCV: 102.8 fL — ABNORMAL HIGH (ref 80.0–100.0)
Platelets: 266 10*3/uL (ref 150–400)
RBC: 2.85 MIL/uL — ABNORMAL LOW (ref 3.87–5.11)
RDW: 13.1 % (ref 11.5–15.5)
WBC: 10.2 10*3/uL (ref 4.0–10.5)
nRBC: 0 % (ref 0.0–0.2)

## 2019-02-19 LAB — BASIC METABOLIC PANEL
Anion gap: 13 (ref 5–15)
BUN: 53 mg/dL — ABNORMAL HIGH (ref 8–23)
CO2: 31 mmol/L (ref 22–32)
Calcium: 9 mg/dL (ref 8.9–10.3)
Chloride: 93 mmol/L — ABNORMAL LOW (ref 98–111)
Creatinine, Ser: 1.21 mg/dL — ABNORMAL HIGH (ref 0.44–1.00)
GFR calc Af Amer: 44 mL/min — ABNORMAL LOW (ref >60)
GFR calc non Af Amer: 38 mL/min — ABNORMAL LOW (ref >60)
Glucose, Bld: 174 mg/dL — ABNORMAL HIGH (ref 70–99)
Potassium: 5.7 mmol/L — ABNORMAL HIGH (ref 3.5–5.1)
Sodium: 137 mmol/L (ref 135–145)

## 2019-02-19 LAB — POTASSIUM: Potassium: 6 mmol/L — ABNORMAL HIGH (ref 3.5–5.1)

## 2019-02-19 MED ORDER — GABAPENTIN 300 MG PO CAPS: 300.0000 mg | ORAL_CAPSULE | Freq: Every day | ORAL | 0 refills | Status: AC

## 2019-02-19 MED ORDER — DOCUSATE SODIUM 100 MG PO CAPS: 100.0000 mg | ORAL_CAPSULE | Freq: Two times a day (BID) | ORAL | 0 refills | Status: AC

## 2019-02-19 MED ORDER — ONDANSETRON HCL 4 MG/2ML IJ SOLN
4.0000 mg | Freq: Four times a day (QID) | INTRAMUSCULAR | Status: DC | PRN
  Administered 2019-02-19 – 2019-02-20 (×2): 4 mg via INTRAVENOUS
  Filled 2019-02-19 (×2): qty 2

## 2019-02-19 MED ORDER — ENSURE ENLIVE PO LIQD: 237.0000 mL | Freq: Two times a day (BID) | ORAL | Status: DC

## 2019-02-19 MED ORDER — PANTOPRAZOLE SODIUM 40 MG PO TBEC: 40.0000 mg | DELAYED_RELEASE_TABLET | Freq: Every day | ORAL | 0 refills | Status: AC

## 2019-02-19 MED ORDER — GABAPENTIN 300 MG PO CAPS
300.0000 mg | ORAL_CAPSULE | Freq: Every day | ORAL | Status: DC
  Administered 2019-02-19: 21:00:00 300 mg via ORAL
  Filled 2019-02-19: qty 1

## 2019-02-19 MED ORDER — METHOCARBAMOL 500 MG PO TABS: 500.0000 mg | ORAL_TABLET | Freq: Four times a day (QID) | ORAL | 0 refills | Status: AC | PRN

## 2019-02-19 MED ORDER — METHOCARBAMOL 500 MG PO TABS
500.0000 mg | ORAL_TABLET | Freq: Four times a day (QID) | ORAL | Status: DC | PRN
  Administered 2019-02-19: 500 mg via ORAL
  Filled 2019-02-19 (×2): qty 1

## 2019-02-19 MED ORDER — AMLODIPINE BESYLATE 2.5 MG PO TABS: 2.5000 mg | ORAL_TABLET | Freq: Every day | ORAL | Status: DC

## 2019-02-19 MED ORDER — ATORVASTATIN CALCIUM 10 MG PO TABS: 10.0000 mg | ORAL_TABLET | Freq: Every day | ORAL | Status: DC

## 2019-02-19 MED ORDER — METHOCARBAMOL 1000 MG/10ML IJ SOLN
500.0000 mg | Freq: Four times a day (QID) | INTRAVENOUS | Status: DC | PRN
  Filled 2019-02-19: qty 5

## 2019-02-19 MED ORDER — ALBUTEROL SULFATE (2.5 MG/3ML) 0.083% IN NEBU: 3.0000 mL | INHALATION_SOLUTION | Freq: Four times a day (QID) | RESPIRATORY_TRACT | Status: DC | PRN

## 2019-02-19 MED ORDER — SORBITOL 70 % SOLN: 30.0000 mL | Freq: Every day | Status: DC | PRN

## 2019-02-19 MED ORDER — SODIUM POLYSTYRENE SULFONATE 15 GM/60ML PO SUSP
15.0000 g | Freq: Once | ORAL | Status: AC
  Administered 2019-02-19: 15 g via ORAL
  Filled 2019-02-19: qty 60

## 2019-02-19 MED ORDER — APIXABAN 2.5 MG PO TABS
2.5000 mg | ORAL_TABLET | Freq: Two times a day (BID) | ORAL | Status: DC
  Administered 2019-02-19: 21:00:00 2.5 mg via ORAL
  Filled 2019-02-19: qty 1

## 2019-02-19 MED ORDER — HYDROCODONE-ACETAMINOPHEN 5-325 MG PO TABS: 1.0000 | ORAL_TABLET | Freq: Four times a day (QID) | ORAL | 0 refills | Status: AC | PRN

## 2019-02-19 MED ORDER — PANTOPRAZOLE SODIUM 40 MG PO TBEC: 40.0000 mg | DELAYED_RELEASE_TABLET | Freq: Every day | ORAL | Status: DC

## 2019-02-19 MED ORDER — ONDANSETRON HCL 4 MG PO TABS
4.0000 mg | ORAL_TABLET | Freq: Four times a day (QID) | ORAL | Status: DC | PRN
  Filled 2019-02-19: qty 1

## 2019-02-19 MED ORDER — HYDROCODONE-ACETAMINOPHEN 5-325 MG PO TABS
1.0000 | ORAL_TABLET | Freq: Four times a day (QID) | ORAL | Status: DC | PRN
  Filled 2019-02-19: qty 2

## 2019-02-19 MED ORDER — POLYETHYLENE GLYCOL 3350 17 G PO PACK: 17.0000 g | PACK | Freq: Every day | ORAL | Status: DC | PRN

## 2019-02-19 MED ORDER — BISACODYL 5 MG PO TBEC: 5.0000 mg | DELAYED_RELEASE_TABLET | Freq: Every day | ORAL | Status: DC | PRN

## 2019-02-19 MED ORDER — ADULT MULTIVITAMIN W/MINERALS CH: 1.0000 | ORAL_TABLET | Freq: Every day | ORAL | Status: DC

## 2019-02-19 NOTE — Progress Notes (Signed)
Patient arrived to unit to room 4W07, no s/s of resp distress. Patient complained of pain and being really tired. No further complications noted at this time.  Audie Clear, LPN

## 2019-02-19 NOTE — H&P (Signed)
Physical Medicine and Rehabilitation Admission H&P    Chief Complaint  Patient presents with  . Fall  . Leg Injury  : HPI: Hannah Mercado is a 85 year old right-handed female with history of rectal cancer, atrial fibrillation with pacemaker maintained on Eliquis, hypertension, hyperlipidemia, chronic renal insufficiency creatinine 1.05, pulmonary fibrosis.  Per chart review patient lives alone.  Independent with assistive device.  Daughter and son-in-law planning to assist as needed on discharge.  Presented 02/15/2019 after mechanical fall without loss of consciousness.  X-rays and imaging revealed right intertrochanteric femur fracture.  Patient was cleared for surgery and underwent IM fixation of right femur 02/17/2019 per Dr. Lyla Glassing.  Weightbearing as tolerated right lower extremity.  Hospital course pain management.  Chronic Eliquis has been resumed.  Acute blood loss anemia 9.7 and monitored.  Noted hyperkalemia 5.7/6.0 and patient did receive Kayexalate as well as monitoring of creatinine 1.21.Therapy evaluation completed and patient was admitted for a comprehensive rehab program  Review of Systems  Constitutional: Negative for chills and fever.  HENT: Negative for hearing loss.   Eyes: Negative for blurred vision and double vision.  Respiratory: Positive for shortness of breath. Negative for cough.   Cardiovascular: Positive for palpitations and leg swelling. Negative for chest pain.  Gastrointestinal: Positive for constipation. Negative for heartburn and nausea.       GERD  Genitourinary: Negative for dysuria, flank pain and hematuria.  Musculoskeletal: Positive for falls, joint pain and myalgias.  Skin: Negative for rash.  Psychiatric/Behavioral:       Anxiety  All other systems reviewed and are negative.  Past Medical History:  Diagnosis Date  . Allergy history unknown   . Anxiety   . Asthmatic bronchitis    use inhaler prn   . Atrial fibrillation ([place]) 10/16/2015   . Blood transfusion    ? with ectopic pregnancy 50 yrs ago   . Cardiac pacemaker in situ 02/24/2016  . Carotid artery disease ([place]) 03/12/2015  . Chronic venous insufficiency 09/11/2015  . Diaphragmatic hernia 07/31/2008   Qualifier: Diagnosis of  By: Lenna Gilford MD, Deborra Medina   . Diverticulosis of colon without hemorrhage 08/02/2012  . DJD (degenerative joint disease)   . Edema 09/11/2015  . GERD (gastroesophageal reflux disease)   . Hiatal hernia   . History of pneumonia    right middle lobe  . Hypercholesteremia   . HYPERCHOLESTEROLEMIA 04/19/2007   Qualifier: Diagnosis of  By: Julien Girt CMA, Marliss Czar    . Hypertension 04/14/2011  . Mitral valve disorder 04/20/2007   Qualifier: Diagnosis of  By: Lenna Gilford MD, Deborra Medina   . Osteopenia   . Persistent atrial fibrillation ([place])   . PULMONARY FIBROSIS, POSTINFLAMMATORY 07/31/2008   Qualifier: Diagnosis of  By: Lenna Gilford MD, Deborra Medina   . Rectal cancer ([place])   . Right carotid bruit 08/28/2014   Past Surgical History:  Procedure Laterality Date  . APPENDECTOMY    . COLON RESECTION  02/08/2011   Procedure: LAPAROSCOPIC SIGMOID COLON RESECTION;  Surgeon: Stark Klein, MD;  Location: [facility];  Service: General;  Laterality: N/A;  Laparoscopic Lower Anterior Bowel Resection  . COLON SURGERY    . COLONOSCOPY  11/27/10   rectal cancer, small sigmoid adenoma, diverticulosis  . ECTOPIC PREGNANCY SURGERY     Midline incision  . EP IMPLANTABLE DEVICE N/A 10/16/2015   Procedure: Pacemaker Implant;  Surgeon: Will Meredith Leeds, MD;  Location: [facility];  Service: Cardiovascular;  Laterality: N/A;  . HAMMER TOE SURGERY  1995   Dr. Silverio Decamp  . Laparoscopic Left oophorectomy, laparoscopic low anterior resection, placement of OnQ pain pump  02/08/2011  . NASAL SINUS SURGERY  1994  . OVARIAN CYST REMOVAL  02/08/2011   Procedure: OVARIAN CYSTECTOMY;  Surgeon: Stark Klein, MD;  Location: [facility];  Service: General;  Laterality: Left;  Removal of Left Ovary and Pelvic  Mass  . VAGINAL HYSTERECTOMY  1980's   Family History  Problem Relation Age of Onset  . Heart disease Father   . Lung cancer Brother        x 2  . Colon cancer Neg Hx    Social History:  reports that she has never smoked. She has never used smokeless tobacco. She reports current alcohol use. She reports that she does not use drugs. Allergies:  Allergies  Allergen Reactions  . Sulfonamide Derivatives Nausea And Vomiting    Happened a long time ago, does not remember the other reaction she had.   Medications Prior to Admission  Medication Sig Dispense Refill  . albuterol (PROVENTIL HFA;VENTOLIN HFA) 108 (90 BASE) MCG/ACT inhaler Inhale 2 puffs into the lungs every 6 (six) hours as needed. Wheezing     . alendronate (FOSAMAX) 70 MG tablet Take 70 mg by mouth once a week.    Marland Kitchen amLODipine (NORVASC) 5 MG tablet TAKE 1 TABLET BY MOUTH EVERY DAY (Patient taking differently: Take 5 mg by mouth daily. ) 30 tablet 5  . apixaban (ELIQUIS) 2.5 MG TABS tablet Take 1 tablet (2.5 mg total) by mouth 2 (two) times daily. 60 tablet 5  . ascorbic acid (VITAMIN C) 250 MG CHEW Chew 250 mg by mouth daily.     Marland Kitchen atorvastatin (LIPITOR) 20 MG tablet Take 0.5 tablets (10 mg total) by mouth daily. 90 tablet 1  . Calcium Carbonate-Vitamin D (CALCIUM-VITAMIN D) 500-200 MG-UNIT per tablet Take 2 tablets by mouth daily.     . Cholecalciferol (VITAMIN D3) 1000 UNITS CAPS Take 1 capsule by mouth daily.     . furosemide (LASIX) 20 MG tablet TAKE 1 TABLET BY MOUTH AS NEEDED FOR SWELLING (Patient taking differently: Take 20 mg by mouth daily as needed for fluid (swelling). ) 30 tablet 2  . vitamin B-12 (CYANOCOBALAMIN) 250 MCG tablet Take 250 mcg by mouth daily.     . vitamin E (VITAMIN E) 200 UNIT capsule Take 200 Units by mouth daily.       Drug Regimen Review Drug regimen was reviewed and remains appropriate with no significant issues identified  Home: Home Living Family/patient expects to be discharged to::  Private residence Living Arrangements: Alone Available Help at Discharge: Family, Available 24 hours/day(daughter and son-in-law coming to stay with patient) Type of Home: House Home Access: Stairs to enter, Scientist, research (physical sciences) of Steps: 1 Home Layout: Two level Alternate Level Stairs-Number of Steps: elevator to 2nd floor Bathroom Shower/Tub: Multimedia programmer: Handicapped height Home Equipment: [place] - single point   Functional History: Prior Function Level of Independence: Independent with assistive device(s) Comments: uses cane when out of house  Functional Status:  Mobility: Bed Mobility Overal bed mobility: Needs Assistance Bed Mobility: Sit to Supine Sit to supine: Mod assist General bed mobility comments: Pt out of bed in recliner Transfers Overall transfer level: Needs assistance Equipment used: Rolling walker (2 wheeled) Transfers: Sit to/from Stand Sit to Stand: Mod assist(from recliner) General transfer comment: VCs for safe hand placement Ambulation/Gait Ambulation/Gait assistance: Mod assist Gait Distance (Feet): 5 Feet Assistive device: Rolling  walker (2 wheeled) Gait Pattern/deviations: Step-to pattern, Antalgic, Decreased weight shift to right, Shuffle General Gait Details: patient only took a few steps back to bed Gait velocity: decreased    ADL: ADL Overall ADL's : Needs assistance/impaired Eating/Feeding: Independent, Sitting Grooming: Set up, Sitting Upper Body Bathing: Set up, Sitting Lower Body Bathing: Minimal assistance Lower Body Bathing Details (indicate cue type and reason): Mod A sit<>stand from recliner Upper Body Dressing : Set up, Sitting Lower Body Dressing: Moderate assistance Lower Body Dressing Details (indicate cue type and reason): Mod A sit<>stand from recliner Toilet Transfer: Minimal assistance, RW, BSC, Stand-pivot Toileting- Clothing Manipulation and Hygiene: Moderate assistance Toileting - Clothing  Manipulation Details (indicate cue type and reason): Mod A sit<>stand from recliner  Cognition: Cognition Overall Cognitive Status: Within Functional Limits for tasks assessed Orientation Level: Oriented X4 Cognition Arousal/Alertness: Awake/alert Behavior During Therapy: WFL for tasks assessed/performed Overall Cognitive Status: Within Functional Limits for tasks assessed  Physical Exam: Blood pressure (!) 129/52, pulse 66, temperature 97.9 F (36.6 C), temperature source Oral, resp. rate 16, height 5\' 7"  (1.702 m), weight 56.7 kg, SpO2 98 %. Physical Exam  Constitutional: She is oriented to person, place, and time. She appears well-developed and well-nourished. No distress.  HENT:  Head: Normocephalic and atraumatic.  Eyes: Pupils are equal, round, and reactive to light. EOM are normal.  Neck: Normal range of motion. No tracheal deviation present. No thyromegaly present.  Cardiovascular: Normal rate and regular rhythm. Exam reveals no friction rub.  No murmur heard. Respiratory: Effort normal. No respiratory distress. She has no wheezes. She has no rales.  GI: Soft. She exhibits no distension. There is no abdominal tenderness. There is no rebound.  Musculoskeletal:     Comments: Right thigh with edema, tender to palpation and with ROM  Neurological: She is alert and oriented to person, place, and time. She has normal reflexes. No cranial nerve deficit. Coordination normal.  UE 5/5. LLE 4-/5 prox to 5/5 distally. RLE: 1-2/5 HF, KE and 4-/5 distally. Decreased LT/pain to mid calf bilaterally. DTR's 1+.   Skin:  Hip incisions dry with glue/foam dressings right thigh.   Psychiatric: She has a normal mood and affect. Her behavior is normal. Judgment and thought content normal.    Results for orders placed or performed during the hospital encounter of 02/15/19 (from the past 48 hour(s))  CBC     Status: Abnormal   Collection Time: 02/17/19 11:44 AM  Result Value Ref Range   WBC 14.2  (H) 4.0 - 10.5 K/uL   RBC 2.87 (L) 3.87 - 5.11 MIL/uL   Hemoglobin 9.8 (L) 12.0 - 15.0 g/dL   HCT 30.3 (L) 36.0 - 46.0 %   MCV 105.6 (H) 80.0 - 100.0 fL   MCH 34.1 (H) 26.0 - 34.0 pg   MCHC 32.3 30.0 - 36.0 g/dL   RDW 13.1 11.5 - 15.5 %   Platelets 191 150 - 400 K/uL   nRBC 0.0 0.0 - 0.2 %    Comment: Performed at [facility], [place] [address] [place], [place] [address]  Creatinine, serum     Status: Abnormal   Collection Time: 02/17/19 11:44 AM  Result Value Ref Range   Creatinine, Ser 0.97 0.44 - 1.00 mg/dL   GFR calc non Af Amer 50 (L) >60 mL/min   GFR calc Af Amer 58 (L) >60 mL/min    Comment: Performed at [facility] [address] [place], [place] [address]  CBC  Status: Abnormal   Collection Time: 02/18/19  2:04 AM  Result Value Ref Range   WBC 11.5 (H) 4.0 - 10.5 K/uL   RBC 2.57 (L) 3.87 - 5.11 MIL/uL   Hemoglobin 8.7 (L) 12.0 - 15.0 g/dL   HCT 26.7 (L) 36.0 - 46.0 %   MCV 103.9 (H) 80.0 - 100.0 fL   MCH 33.9 26.0 - 34.0 pg   MCHC 32.6 30.0 - 36.0 g/dL   RDW 13.0 11.5 - 15.5 %   Platelets 181 150 - 400 K/uL   nRBC 0.0 0.0 - 0.2 %    Comment: Performed at [facility], [place] [address] [place], [place] [address]  Basic metabolic panel     Status: Abnormal   Collection Time: 02/18/19  2:04 AM  Result Value Ref Range   Sodium 135 135 - 145 mmol/L   Potassium 5.1 3.5 - 5.1 mmol/L   Chloride 97 (L) 98 - 111 mmol/L   CO2 27 22 - 32 mmol/L   Glucose, Bld 167 (H) 70 - 99 mg/dL   BUN 36 (H) 8 - 23 mg/dL   Creatinine, Ser 1.05 (H) 0.44 - 1.00 mg/dL   Calcium 8.6 (L) 8.9 - 10.3 mg/dL   GFR calc non Af Amer 45 (L) >60 mL/min   GFR calc Af Amer 53 (L) >60 mL/min   Anion gap 11 5 - 15    Comment: Performed at [facility] [address], [place], [place] [address]  CBC     Status: Abnormal   Collection Time: 02/18/19 10:26 AM  Result Value Ref Range   WBC 12.9 (H) 4.0 - 10.5 K/uL   RBC 2.70 (L) 3.87 - 5.11 MIL/uL   Hemoglobin 9.2 (L) 12.0 -  15.0 g/dL   HCT 27.8 (L) 36.0 - 46.0 %   MCV 103.0 (H) 80.0 - 100.0 fL   MCH 34.1 (H) 26.0 - 34.0 pg   MCHC 33.1 30.0 - 36.0 g/dL   RDW 13.2 11.5 - 15.5 %   Platelets 232 150 - 400 K/uL   nRBC 0.0 0.0 - 0.2 %    Comment: Performed at [facility], [place] [address], [place], [place] [address]  CBC     Status: Abnormal   Collection Time: 02/19/19  4:58 AM  Result Value Ref Range   WBC 10.2 4.0 - 10.5 K/uL   RBC 2.85 (L) 3.87 - 5.11 MIL/uL   Hemoglobin 9.7 (L) 12.0 - 15.0 g/dL   HCT 29.3 (L) 36.0 - 46.0 %   MCV 102.8 (H) 80.0 - 100.0 fL   MCH 34.0 26.0 - 34.0 pg   MCHC 33.1 30.0 - 36.0 g/dL   RDW 13.1 11.5 - 15.5 %   Platelets 266 150 - 400 K/uL   nRBC 0.0 0.0 - 0.2 %    Comment: Performed at [facility], [place] [address] [place], [place] [address]  Basic metabolic panel     Status: Abnormal   Collection Time: 02/19/19  4:58 AM  Result Value Ref Range   Sodium 137 135 - 145 mmol/L   Potassium 5.7 (H) 3.5 - 5.1 mmol/L   Chloride 93 (L) 98 - 111 mmol/L   CO2 31 22 - 32 mmol/L   Glucose, Bld 174 (H) 70 - 99 mg/dL   BUN 53 (H) 8 - 23 mg/dL   Creatinine, Ser 1.21 (H) 0.44 - 1.00 mg/dL   Calcium 9.0 8.9 - 10.3 mg/dL   GFR  calc non Af Amer 38 (L) >60 mL/min   GFR calc Af Amer 44 (L) >60 mL/min   Anion gap 13 5 - 15    Comment: Performed at [facility] [address], [place],  [address]   Pelvis Portable  Result Date: 02/17/2019 CLINICAL DATA:  Postop day 0 ORIF comminuted intertrochanteric RIGHT femoral neck fracture EXAM: PORTABLE PELVIS 1-2 VIEWS COMPARISON:  02/15/2019. FINDINGS: ORIF of the comminuted intertrochanteric RIGHT femoral neck fracture with an intramedullary nail and compression screw. Marked improvement in alignment post ORIF. No acute complicating features. Mild osseous demineralization. Contralateral LEFT hip joint with relatively well-preserved joint space. IMPRESSION: 1. Marked improvement in alignment post ORIF of the comminuted  intertrochanteric RIGHT femoral neck fracture. No acute complicating features. 2. Mild osseous demineralization. Electronically Signed   By: Evangeline Dakin M.D.   On: 02/17/2019 12:08   Dg Femur [place] 2 Views Right  Result Date: 02/17/2019 CLINICAL DATA:  Postop day 0 of ORIF of a comminuted intertrochanteric RIGHT femoral neck fracture. EXAM: RIGHT FEMUR PORTABLE 2 VIEW 10:35 a.m.: COMPARISON:  Intraoperative RIGHT femur x-rays earlier same day at 9:25 a.m. RIGHT hip x-rays 02/15/2019. FINDINGS: Marked improvement in alignment post ORIF of the comminuted intertrochanteric RIGHT femoral neck fracture with intramedullary nail and compression screw. No acute complicating features. IMPRESSION: Marked improvement in alignment post ORIF of the comminuted intertrochanteric RIGHT femoral neck fracture without acute complicating features. Electronically Signed   By: Evangeline Dakin M.D.   On: 02/17/2019 12:09       Medical Problem List and Plan: 1.  Decreased functional mobility secondary to right intertrochanteric femur fracture.  Status post IM fixation 02/17/2019.  Weightbearing as tolerated  -patient may   shower  -ELOS/Goals: 10-14 days 2.  Antithrombotics: -DVT/anticoagulation: Chronic Eliquis  -antiplatelet therapy: N/A 3. Pain Management: Neurontin 300 mg nightly, hydrocodone and Robaxin as needed  -may need to schedule meds with therapies---observe for now 4. Mood: Provide emotional support  -antipsychotic agents: N/A 5. Neuropsych: This patient is capable of making decisions on her own behalf. 6. Skin/Wound Care: Routine skin checks 7. Fluids/Electrolytes/Nutrition: Routine in and outs with follow-up chemistries on admit.  -kayexalate for hyperkalemia  -unclear origin of hyper k+, Cr 1.21  -recheck in AM 8.  Acute blood loss anemia.  Follow-up CBC 9.  Atrial fibrillation with pacemaker.  Cardiac rate controlled at present.   -monitor HGB 10.  Hypertension.  Norvasc 2.5 mg  daily 11.  Pulmonary fibrosis.  Continue inhalers as directed.  Check oxygen saturations every shift 12.  Hyperlipidemia.  Lipitor 13.  CKD stage III.  Follow-up chemistries on admit 14.  GERD.  Protonix 15.  Constipation.  Laxative assistance  -diet discussed  -pt did have an episode of n/v this morning after breakfast---may have been d/t meds        Cathlyn Parsons, PA-C 02/19/2019

## 2019-02-19 NOTE — PMR Pre-admission (Signed)
PMR Admission Coordinator Pre-Admission Assessment  Patient: Hannah Mercado is an 85 y.o., female MRN: [id number] DOB: 07-Dec-1922 Height: _0  (170.2 cm) Weight: 56.7 kg  Insurance Information HMO:     PPO:      PCP:      IPA:      80/20:      OTHER:  PRIMARY: Medicare A and B      Policy#: [id number]      Subscriber: patient CM Name:       Phone#:      Fax#:  Pre-Cert#: verified eligibility online      Employer:  Benefits:  Phone #:      Name:  Eff. Date: 09/19/1989     Deduct: $1408      Out of Pocket Max:       Life Max:  CIR: 100%      SNF: 20 full days Outpatient:      Co-Pay:  Home Health:       Co-Pay:  DME:      Co-Pay:  Providers:  SECONDARY: A[employer]      Policy#: [id number]      Subscriber: patient CM Name:       Phone#:      Fax#:  Pre-Cert#:       Employer:  Benefits:  Phone #: [phone]     Name:  Eff. Date:      Deduct:       Out of Pocket Max:       Life Max:  CIR:       SNF:  Outpatient:      Co-Pay:  Home Health:       Co-Pay:  DME:      Co-Pay:   Medicaid Application Date:       Case Manager:  Disability Application Date:       Case Worker:   The "Data Collection Information Summary" for patients in Inpatient Rehabilitation Facilities with attached "Privacy Act [place] Records" was provided and verbally reviewed with: Patient  Emergency Contact Information Contact Information    Name Relation Home Work [place] Son [phone]  [phone]   Brooks Sailors Daughter [phone]      Current Medical History  Patient Admitting Diagnosis: R femur fracture   History of Present Illness: Hannah Mercado is a 85 year old right-handed female with history of rectal cancer, atrial fibrillation with pacemaker maintained on Eliquis, hypertension, hyperlipidemia, chronic renal insufficiency creatinine 1.05, pulmonary fibrosis.  Presented 02/15/2019 after mechanical fall without loss of consciousness.   X-rays and imaging revealed right intertrochanteric femur fracture.  Patient was cleared for surgery and underwent IM fixation of right femur 02/17/2019 per Dr. Lyla Glassing.  Weightbearing as tolerated on the right lower extremity.  Hospital course pain management.  Chronic Eliquis has been resumed.  Acute blood loss anemia 9.7 and monitored.  Noted hyperkalemia 5.7 and adjusted with creatinine 1.21.Therapy evaluation completed and patient was recommended for a comprehensive rehab program.     Patient's medical record from [facility] has been reviewed by the rehabilitation admission coordinator and physician.  Past Medical History  Past Medical History:  Diagnosis Date  . Allergy history unknown   . Anxiety   . Asthmatic bronchitis    use inhaler prn   . Atrial fibrillation ([place]) 10/16/2015  . Blood transfusion    ? with ectopic pregnancy 50 yrs ago   . Cardiac pacemaker in situ 02/24/2016  . Carotid artery disease ([place]) 03/12/2015  .  Chronic venous insufficiency 09/11/2015  . Diaphragmatic hernia 07/31/2008   Qualifier: Diagnosis of  By: Lenna Gilford MD, Deborra Medina   . Diverticulosis of colon without hemorrhage 08/02/2012  . DJD (degenerative joint disease)   . Edema 09/11/2015  . GERD (gastroesophageal reflux disease)   . Hiatal hernia   . History of pneumonia    right middle lobe  . Hypercholesteremia   . HYPERCHOLESTEROLEMIA 04/19/2007   Qualifier: Diagnosis of  By: Julien Girt CMA, Marliss Czar    . Hypertension 04/14/2011  . Mitral valve disorder 04/20/2007   Qualifier: Diagnosis of  By: Lenna Gilford MD, Deborra Medina   . Osteopenia   . Persistent atrial fibrillation ([place])   . PULMONARY FIBROSIS, POSTINFLAMMATORY 07/31/2008   Qualifier: Diagnosis of  By: Lenna Gilford MD, Deborra Medina   . Rectal cancer ([place])   . Right carotid bruit 08/28/2014    Family History   family history includes Heart disease in her father; Lung cancer in her brother.  Prior Rehab/Hospitalizations Has the patient had prior rehab or  hospitalizations prior to admission? No  Has the patient had major surgery during 100 days prior to admission? Yes   Current Medications  Current Facility-Administered Medications:  .  albuterol (PROVENTIL) (2.5 MG/3ML) 0.083% nebulizer solution 3 mL, 3 mL, Inhalation, Q6H PRN, Swinteck, Aaron Edelman, MD .  amLODipine (NORVASC) tablet 2.5 mg, 2.5 mg, Oral, Daily, Shawna Clamp, MD, 2.5 mg at 02/19/19 1028 .  apixaban (ELIQUIS) tablet 2.5 mg, 2.5 mg, Oral, BID, Swinteck, Aaron Edelman, MD, 2.5 mg at 02/19/19 1027 .  atorvastatin (LIPITOR) tablet 10 mg, 10 mg, Oral, Daily, Swinteck, Aaron Edelman, MD, 10 mg at 02/19/19 1027 .  bisacodyl (DULCOLAX) EC tablet 5 mg, 5 mg, Oral, Daily PRN, Swinteck, Aaron Edelman, MD .  docusate sodium (COLACE) capsule 100 mg, 100 mg, Oral, BID, Swinteck, Aaron Edelman, MD, 100 mg at 02/19/19 1027 .  feeding supplement (ENSURE ENLIVE) (ENSURE ENLIVE) liquid 237 mL, 237 mL, Oral, BID BM, Swinteck, Brian, MD, 237 mL at 02/19/19 1045 .  gabapentin (NEURONTIN) capsule 300 mg, 300 mg, Oral, QHS, Swinteck, Aaron Edelman, MD, 300 mg at 02/18/19 2215 .  HYDROcodone-acetaminophen (NORCO/VICODIN) 5-325 MG per tablet 1-2 tablet, 1-2 tablet, Oral, Q6H PRN, Rod Can, MD, 1 tablet at 02/19/19 1027 .  loperamide (IMODIUM) capsule 2 mg, 2 mg, Oral, Q6H PRN, Swinteck, Aaron Edelman, MD .  menthol-cetylpyridinium (CEPACOL) lozenge 3 mg, 1 lozenge, Oral, PRN **OR** phenol (CHLORASEPTIC) mouth spray 1 spray, 1 spray, Mouth/Throat, PRN, Swinteck, Aaron Edelman, MD .  methocarbamol (ROBAXIN) tablet 500 mg, 500 mg, Oral, Q6H PRN, 500 mg at 02/18/19 1942 **OR** methocarbamol (ROBAXIN) 500 mg in dextrose 5 % 50 mL IVPB, 500 mg, Intravenous, Q6H PRN, Swinteck, Aaron Edelman, MD .  metoCLOPramide (REGLAN) tablet 5-10 mg, 5-10 mg, Oral, Q8H PRN **OR** metoCLOPramide (REGLAN) injection 5-10 mg, 5-10 mg, Intravenous, Q8H PRN, Swinteck, Aaron Edelman, MD .  multivitamin with minerals tablet 1 tablet, 1 tablet, Oral, Daily, Swinteck, Aaron Edelman, MD, 1 tablet at 02/19/19  1027 .  ondansetron (ZOFRAN) tablet 4 mg, 4 mg, Oral, Q6H PRN **OR** ondansetron (ZOFRAN) injection 4 mg, 4 mg, Intravenous, Q6H PRN, Rod Can, MD, 4 mg at 02/18/19 2223 .  pantoprazole (PROTONIX) EC tablet 40 mg, 40 mg, Oral, Daily, Swinteck, Aaron Edelman, MD, 40 mg at 02/19/19 1027 .  polyethylene glycol (MIRALAX / GLYCOLAX) packet 17 g, 17 g, Oral, Daily PRN, Rod Can, MD  Patients Current Diet:  Diet Order            Diet regular Room service appropriate? Yes;  Fluid consistency: Thin  Diet effective now              Precautions / Restrictions Precautions Precautions: Fall Restrictions Weight Bearing Restrictions: No RLE Weight Bearing: Weight bearing as tolerated   Has the patient had 2 or more falls or a fall with injury in the past year? Yes  Prior Activity Level Limited Community (1-2x/wk): driving occasionally to run errands  Prior Functional Level Self Care: Did the patient need help bathing, dressing, using the toilet or eating? Independent  Indoor Mobility: Did the patient need assistance with walking from room to room (with or without device)? Independent  Stairs: Did the patient need assistance with internal or external stairs (with or without device)? Independent  Functional Cognition: Did the patient need help planning regular tasks such as shopping or remembering to take medications? [place] / Equipment Home Assistive Devices/Equipment: Environmental consultant (specify type), Cane (specify quad or straight), Dentures (specify type), Eyeglasses, Grab bars in shower, Shower chair with back Home Equipment: Cane - single point  Prior Device Use: Indicate devices/aids used by the patient prior to current illness, exacerbation or injury? Walker  Current Functional Level Cognition  Overall Cognitive Status: Within Functional Limits for tasks assessed Orientation Level: Oriented X4    Extremity Assessment (includes Sensation/Coordination)   Upper Extremity Assessment: Overall WFL for tasks assessed  Lower Extremity Assessment: Generalized weakness    ADLs  Overall ADL's : Needs assistance/impaired Eating/Feeding: Independent, Sitting Grooming: Set up, Sitting Upper Body Bathing: Set up, Sitting Lower Body Bathing: Minimal assistance Lower Body Bathing Details (indicate cue type and reason): Mod A sit<>stand from recliner Upper Body Dressing : Set up, Sitting Lower Body Dressing: Moderate assistance Lower Body Dressing Details (indicate cue type and reason): Mod A sit<>stand from recliner Toilet Transfer: Minimal assistance, RW, BSC, Stand-pivot Toileting- Clothing Manipulation and Hygiene: Moderate assistance Toileting - Clothing Manipulation Details (indicate cue type and reason): Mod A sit<>stand from recliner    Mobility  Overal bed mobility: Needs Assistance Bed Mobility: Sit to Supine Sit to supine: Mod assist General bed mobility comments: Pt out of bed in recliner    Transfers  Overall transfer level: Needs assistance Equipment used: Rolling walker (2 wheeled) Transfers: Sit to/from Stand Sit to Stand: Mod assist(from recliner) General transfer comment: VCs for safe hand placement    Ambulation / Gait / Stairs / Wheelchair Mobility  Ambulation/Gait Ambulation/Gait assistance: Mod assist Gait Distance (Feet): 5 Feet Assistive device: Rolling walker (2 wheeled) Gait Pattern/deviations: Step-to pattern, Antalgic, Decreased weight shift to right, Shuffle General Gait Details: patient only took a few steps back to bed Gait velocity: decreased    Posture / Balance Balance Overall balance assessment: Needs assistance Sitting-balance support: No upper extremity supported, Feet supported Sitting balance-Leahy Scale: Good Standing balance support: Bilateral upper extremity supported Standing balance-Leahy Scale: Poor Standing balance comment: reliant on RW and additional external support    Special needs/care  consideration BiPAP/CPAP no CPM no Continuous Drip IV no Dialysis no        Days n/a Life Vest no Oxygen no Special Bed no Trach Size no Wound Vac (area) no      Location n/a Skin incision to RLE                              Location Bowel mgmt:  Bladder mgmt:  Diabetic mgmt: no Behavioral consideration  Chemo/radiation    Previous Home  Environment (from acute therapy documentation) Living Arrangements: Alone Available Help at Discharge: Family, Available 24 hours/day(daughter and son-in-law coming to stay with patient) Type of Home: House Home Layout: Two level Alternate Level Stairs-Number of Steps: elevator to 2nd floor Home Access: Stairs to enter, Scientist, research (physical sciences) of Steps: 1 Bathroom Shower/Tub: Multimedia programmer: Handicapped height [place]: No  Discharge Living Setting Plans for Discharge Living Setting: Patient's home Type of Home at Discharge: Other (Comment)(townhome) Discharge Home Layout: Two level Alternate Level Stairs-Rails: (elevator) Discharge Home Access: Stairs to enter Entrance Stairs-Rails: None Entrance Stairs-Number of Steps: 2 Discharge Bathroom Shower/Tub: Walk-in shower Discharge Bathroom Toilet: Handicapped height Discharge Bathroom Accessibility: Yes How Accessible: Accessible via walker Does the patient have any problems obtaining your medications?: No  Social/Family/Support Systems Anticipated Caregiver: daughter, Brooks Sailors, and her husband Harrie Jeans, as well as pt's son Tiena Manansala (intermittently available) Anticipated Caregiver's Contact Information: [name] [phone] Ability/Limitations of Caregiver: live in [place] near [place], will have to plan travel down to [place] taking into account state lockdowns and travel restrictions Caregiver Availability: 24/7 Discharge Plan Discussed with Primary Caregiver: Yes Is Caregiver In Agreement with Plan?: Yes Does Caregiver/Family have Issues with  Lodging/Transportation while Pt is in Rehab?: No  Goals/Additional Needs Patient/Family Goal for Rehab: PT/OT supervision to mod I Expected length of stay: 10-12 days Dietary Needs: reg/thin Additional Information: [name] will need time to plan trip from [place] to [place] so that she can get through travel restrictions related to Covid Pt/Family Agrees to Admission and willing to participate: Yes Program Orientation Provided & Reviewed with Pt/Caregiver Including Roles  & Responsibilities: Yes  Barriers to Discharge: Other (comments)(family travelling from [place] to provide 24/7)  Decrease burden of Care through IP rehab admission: n/a  Possible need for SNF placement upon discharge: no  Patient Condition: I have reviewed medical records from [facility], spoken with CM, and patient and daughter. I met with patient at the bedside and discussed via phone for inpatient rehabilitation assessment.  Patient will benefit from ongoing PT and OT, can actively participate in 3 hours of therapy a day 5 days of the week, and can make measurable gains during the admission.  Patient will also benefit from the coordinated team approach during an Inpatient Acute Rehabilitation admission.  The patient will receive intensive therapy as well as Rehabilitation physician, nursing, social worker, and care management interventions.  Due to safety, medication administration, pain management and patient education the patient requires 24 hour a day rehabilitation nursing.  The patient is currently mod assist with mobility and basic ADLs.  Discharge setting and therapy post discharge at home is anticipated.  Patient has agreed to participate in the Acute Inpatient Rehabilitation Program and will admit today.  Preadmission Screen Completed By:  Michel Santee, PT, DPT 02/19/2019 11:17 AM ______________________________________________________________________   Discussed status with Dr. Naaman Plummer on 02/19/19  at 11:21 AM  and  received approval for admission today.  Admission Coordinator:  Michel Santee, PT, DPT time 11:21 AM Sudie Grumbling 02/19/19    Assessment/Plan: 1. Diagnosis: right IT hip fx 2. Does the need for close, 24 hr/day Medical supervision in concert with the patient's rehab needs make it unreasonable for this patient to be served in a less intensive setting? Yes 3. Co-Morbidities requiring supervision/potential complications: rectal cancer, hx of a fib with current hyperkalemia, PF, pain mgt, ABLA 4. Due to bladder management, bowel management, safety, skin/wound care, disease management, medication administration, pain management and patient  education, does the patient require 24 hr/day rehab nursing? Yes 5. Does the patient require coordinated care of a physician, rehab nurse, PT, OT, and SLP to address physical and functional deficits in the context of the above medical diagnosis(es)? Yes Addressing deficits in the following areas: balance, endurance, locomotion, strength, transferring, bowel/bladder control, bathing, dressing, feeding, grooming, toileting and psychosocial support 6. Can the patient actively participate in an intensive therapy program of at least 3 hrs of therapy 5 days a week? Yes 7. The potential for patient to make measurable gains while on inpatient rehab is excellent 8. Anticipated functional outcomes upon discharge from inpatient rehab: modified independent and supervision PT, modified independent and supervision OT, n/a SLP 9. Estimated rehab length of stay to reach the above functional goals is: 10-14 days 10. Anticipated discharge destination: Home 10. Overall Rehab/Functional Prognosis: excellent   MD Signature: Meredith Staggers, MD, [facility] 02/19/2019

## 2019-02-19 NOTE — Progress Notes (Signed)
Physical Therapy Treatment Patient Details Name: Hannah Mercado MRN: [id number] DOB: [birth year] Today's Date: 02/19/2019    History of Present Illness Pt is a 85 y.o. female s/p fall at home resulting in displaced, comminuted right intertrochanteric femur fracture.  Pt underwent IM nail right femur on 02/17/19.  PMH signficant for atrial fibrillation on Eliquis, pulmonary fibrosis, coronary artery disease, hyperlipidemia, rectal cancer, status post pacemaker placement and peripheral neuropathy.    PT Comments    Continuing work on functional mobility and activity tolerance;  Limited by RLE pain today, but still gave good effort; She tells [place] she had taken some medication earlier, but vomited soon after -- very posiible her pain medications did not get into her system, and we didn't have adequate pain control; Still, she was very willing to try, and able to take a few steps; Continue to recommend comprehensive inpatient rehab (CIR) for post-acute therapy needs; I believe she will be able to participate even better with better pain management  Follow Up Recommendations  CIR     Equipment Recommendations  Rolling walker with 5" wheels    Recommendations for Other Services Rehab consult     Precautions / Restrictions Precautions Precautions: Fall Restrictions RLE Weight Bearing: Weight bearing as tolerated    Mobility  Bed Mobility Overal bed mobility: Needs Assistance Bed Mobility: Sit to Supine       Sit to supine: +2 for safety/equipment;Max assist   General bed mobility comments: Max assist and second person helpful; not quite to the bed when she turned and sat, and required lots of help and guarding to ensure safety as she laid down because she was so close to the edge; LOTS of cues for her to let go of her RW to be able to safely lay down  Transfers Overall transfer level: Needs assistance Equipment used: Rolling walker (2 wheeled) Transfers: Sit to/from  Stand Sit to Stand: Mod assist(from recliner)         General transfer comment: VCs for safe hand placement; very painful this session, and she was unable to stand up to fully upright due to pain in RLE; Sat prematurely to bed, and began slipping off edge; Max assist to support her and prevent fall as she was seted so close to EOB  Ambulation/Gait Ambulation/Gait assistance: Mod assist;+2 safety/equipment Gait Distance (Feet): 5 Feet Assistive device: Rolling walker (2 wheeled) Gait Pattern/deviations: Step-to pattern;Decreased weight shift to right;Decreased stance time - right;Trunk flexed     General Gait Details: patient only took a few steps back to bed   Stairs             Wheelchair Mobility    Modified Rankin (Stroke Patients Only)       Balance     Sitting balance-Leahy Scale: Poor       Standing balance-Leahy Scale: Poor                              Cognition Arousal/Alertness: Awake/alert Behavior During Therapy: WFL for tasks assessed/performed Overall Cognitive Status: Impaired/Different from baseline Area of Impairment: Attention                               General Comments: Very internally distracted by pain      Exercises      General Comments General comments (skin integrity, edema, etc.): Incontinent of urine as she took  steps back to bed; RN aware and started to help clean her up      Pertinent Vitals/Pain Pain Assessment: Faces Faces Pain Scale: Hurts whole lot Pain Location: RLE, especially in stance Pain Descriptors / Indicators: Discomfort;Grimacing Pain Intervention(s): Monitored during session    Home Living                      Prior Function            PT Goals (current goals can now be found in the care plan section) Acute Rehab PT Goals Patient Stated Goal: to go back home; Agreeable to try walking back to bed this session PT Goal Formulation: With patient/family Time For Goal  Achievement: 03/05/19 Potential to Achieve Goals: Good Progress towards PT goals: Progressing toward goals(Slowly)    Frequency    Min 4X/week      PT Plan Current plan remains appropriate    Co-evaluation              AM-PAC PT "6 Clicks" Mobility   Outcome Measure  Help needed turning from your back to your side while in a flat bed without using bedrails?: A Lot Help needed moving from lying on your back to sitting on the side of a flat bed without using bedrails?: A Lot Help needed moving to and from a bed to a chair (including a wheelchair)?: A Lot Help needed standing up from a chair using your arms (e.g., wheelchair or bedside chair)?: A Lot Help needed to walk in hospital room?: A Lot Help needed climbing 3-5 steps with a railing? : A Lot 6 Click Score: 12    End of Session Equipment Utilized During Treatment: Gait belt Activity Tolerance: Patient limited by fatigue;Patient limited by pain Patient left: in bed;with call bell/phone within reach;with bed alarm set;with family/visitor present Nurse Communication: Mobility status PT Visit Diagnosis: Difficulty in walking, not elsewhere classified (R26.2);Muscle weakness (generalized) (M62.81);History of falling (Z91.81)     Time: 1173-5670 PT Time Calculation (min) (ACUTE ONLY): 16 min  Charges:  $Gait Training: 8-22 mins                     Roney [place], [place]  Acute Rehabilitation Services Pager [phone] Office [phone]    [place] 02/19/2019, 2:55 PM

## 2019-02-19 NOTE — Progress Notes (Signed)
Meredith Staggers, MD  Physician  Physical Medicine and Rehabilitation  PMR Pre-admission  Signed  Date of Service:  02/19/2019 11:17 AM      Related encounter: ED to Hosp-Admission (Current) from 02/15/2019 in [place]         Show:Clear all [x] Manual[x] Template[x] Copied  Added by: [x] Meredith Staggers, MD[x] Michel Santee, PT  [] [place]over for details PMR Admission Coordinator Pre-Admission Assessment  Patient: Hannah Mercado is an 85 y.o., female MRN: [id number] DOB: Jun 09, 1922 Height: 5' 7"  (170.2 cm) Weight: 56.7 kg  Insurance Information HMO:     PPO:      PCP:      IPA:      80/20:      OTHER:  PRIMARY: Medicare A and B      Policy#: [id number]      Subscriber: patient CM Name:       Phone#:      Fax#:  Pre-Cert#: verified eligibility online      Employer:  Benefits:  Phone #:      Name:  Eff. Date: 09/19/1989     Deduct: $1408      Out of Pocket Max:       Life Max:  CIR: 100%      SNF: 20 full days Outpatient:      Co-Pay:  Home Health:       Co-Pay:  DME:      Co-Pay:  Providers:  SECONDARY: A[employer]      Policy#: [id number]      Subscriber: patient CM Name:       Phone#:      Fax#:  Pre-Cert#:       Employer:  Benefits:  Phone #: [phone]     Name:  Eff. Date:      Deduct:       Out of Pocket Max:       Life Max:  CIR:       SNF:  Outpatient:      Co-Pay:  Home Health:       Co-Pay:  DME:      Co-Pay:   Medicaid Application Date:       Case Manager:  Disability Application Date:       Case Worker:   The "Data Collection Information Summary" for patients in Inpatient Rehabilitation Facilities with attached "Privacy Act [place] Records" was provided and verbally reviewed with: Patient  Emergency Contact Information         Contact Information    Name Relation Home Work [place] Son [phone]  [phone]   Brooks Sailors Daughter [phone]  [phone]      Current Medical History  Patient Admitting Diagnosis: R femur fracture   History of Present Illness: Hannah Mercado a 85 year old right-handed female with history of rectal cancer, atrial fibrillation with pacemaker maintained on Eliquis, hypertension, hyperlipidemia, chronic renal insufficiency creatinine 1.05, pulmonary fibrosis. Presented 02/15/2019 after mechanical fall without loss of consciousness. X-rays and imaging revealed right intertrochanteric femur fracture. Patient was cleared for surgery and underwent IM fixation of right femur 02/17/2019 per Dr. Lyla Glassing. Weightbearing as tolerated on the right lower extremity. Hospital course pain management. Chronic Eliquis has been resumed. Acute blood loss anemia 9.7 and monitored. Noted hyperkalemia 5.7 and adjusted with creatinine 1.21.Therapy evaluation completedand patient was recommended for a comprehensive rehab program.   Patient's [facility] has been reviewed by the  rehabilitation admission coordinator and physician.  Past Medical History      Past Medical History:  Diagnosis Date  . Allergy history unknown   . Anxiety   . Asthmatic bronchitis    use inhaler prn   . Atrial fibrillation ([place]) 10/16/2015  . Blood transfusion    ? with ectopic pregnancy 50 yrs ago   . Cardiac pacemaker in situ 02/24/2016  . Carotid artery disease ([place]) 03/12/2015  . Chronic venous insufficiency 09/11/2015  . Diaphragmatic hernia 07/31/2008   Qualifier: Diagnosis of  By: Lenna Gilford MD, Deborra Medina   . Diverticulosis of colon without hemorrhage 08/02/2012  . DJD (degenerative joint disease)   . Edema 09/11/2015  . GERD (gastroesophageal reflux disease)   . Hiatal hernia   . History of pneumonia    right middle lobe  . Hypercholesteremia   . HYPERCHOLESTEROLEMIA 04/19/2007   Qualifier: Diagnosis of  By: Julien Girt CMA, Marliss Czar    . Hypertension 04/14/2011  . Mitral valve  disorder 04/20/2007   Qualifier: Diagnosis of  By: Lenna Gilford MD, Deborra Medina   . Osteopenia   . Persistent atrial fibrillation ([place])   . PULMONARY FIBROSIS, POSTINFLAMMATORY 07/31/2008   Qualifier: Diagnosis of  By: Lenna Gilford MD, Deborra Medina   . Rectal cancer ([place])   . Right carotid bruit 08/28/2014    Family History   family history includes Heart disease in her father; Lung cancer in her brother.  Prior Rehab/Hospitalizations Has the patient had prior rehab or hospitalizations prior to admission? No  Has the patient had major surgery during 100 days prior to admission? Yes             Current Medications  Current Facility-Administered Medications:  .  albuterol (PROVENTIL) (2.5 MG/3ML) 0.083% nebulizer solution 3 mL, 3 mL, Inhalation, Q6H PRN, Swinteck, Aaron Edelman, MD .  amLODipine (NORVASC) tablet 2.5 mg, 2.5 mg, Oral, Daily, Shawna Clamp, MD, 2.5 mg at 02/19/19 1028 .  apixaban (ELIQUIS) tablet 2.5 mg, 2.5 mg, Oral, BID, Swinteck, Aaron Edelman, MD, 2.5 mg at 02/19/19 1027 .  atorvastatin (LIPITOR) tablet 10 mg, 10 mg, Oral, Daily, Swinteck, Aaron Edelman, MD, 10 mg at 02/19/19 1027 .  bisacodyl (DULCOLAX) EC tablet 5 mg, 5 mg, Oral, Daily PRN, Swinteck, Aaron Edelman, MD .  docusate sodium (COLACE) capsule 100 mg, 100 mg, Oral, BID, Swinteck, Aaron Edelman, MD, 100 mg at 02/19/19 1027 .  feeding supplement (ENSURE ENLIVE) (ENSURE ENLIVE) liquid 237 mL, 237 mL, Oral, BID BM, Swinteck, Brian, MD, 237 mL at 02/19/19 1045 .  gabapentin (NEURONTIN) capsule 300 mg, 300 mg, Oral, QHS, Swinteck, Aaron Edelman, MD, 300 mg at 02/18/19 2215 .  HYDROcodone-acetaminophen (NORCO/VICODIN) 5-325 MG per tablet 1-2 tablet, 1-2 tablet, Oral, Q6H PRN, Rod Can, MD, 1 tablet at 02/19/19 1027 .  loperamide (IMODIUM) capsule 2 mg, 2 mg, Oral, Q6H PRN, Swinteck, Aaron Edelman, MD .  menthol-cetylpyridinium (CEPACOL) lozenge 3 mg, 1 lozenge, Oral, PRN **OR** phenol (CHLORASEPTIC) mouth spray 1 spray, 1 spray, Mouth/Throat, PRN, Swinteck, Aaron Edelman, MD .   methocarbamol (ROBAXIN) tablet 500 mg, 500 mg, Oral, Q6H PRN, 500 mg at 02/18/19 1942 **OR** methocarbamol (ROBAXIN) 500 mg in dextrose 5 % 50 mL IVPB, 500 mg, Intravenous, Q6H PRN, Swinteck, Aaron Edelman, MD .  metoCLOPramide (REGLAN) tablet 5-10 mg, 5-10 mg, Oral, Q8H PRN **OR** metoCLOPramide (REGLAN) injection 5-10 mg, 5-10 mg, Intravenous, Q8H PRN, Swinteck, Aaron Edelman, MD .  multivitamin with minerals tablet 1 tablet, 1 tablet, Oral, Daily, Swinteck, Aaron Edelman, MD, 1 tablet at 02/19/19 1027 .  ondansetron (ZOFRAN) tablet  4 mg, 4 mg, Oral, Q6H PRN **OR** ondansetron (ZOFRAN) injection 4 mg, 4 mg, Intravenous, Q6H PRN, Rod Can, MD, 4 mg at 02/18/19 2223 .  pantoprazole (PROTONIX) EC tablet 40 mg, 40 mg, Oral, Daily, Swinteck, Aaron Edelman, MD, 40 mg at 02/19/19 1027 .  polyethylene glycol (MIRALAX / GLYCOLAX) packet 17 g, 17 g, Oral, Daily PRN, Rod Can, MD  Patients Current Diet:     Diet Order                  Diet regular Room service appropriate? Yes; Fluid consistency: Thin  Diet effective now               Precautions / Restrictions Precautions Precautions: Fall Restrictions Weight Bearing Restrictions: No RLE Weight Bearing: Weight bearing as tolerated   Has the patient had 2 or more falls or a fall with injury in the past year? Yes  Prior Activity Level Limited Community (1-2x/wk): driving occasionally to run errands  Prior Functional Level Self Care: Did the patient need help bathing, dressing, using the toilet or eating? Independent  Indoor Mobility: Did the patient need assistance with walking from room to room (with or without device)? Independent  Stairs: Did the patient need assistance with internal or external stairs (with or without device)? Independent  Functional Cognition: Did the patient need help planning regular tasks such as shopping or remembering to take medications? [place] / Equipment Home Assistive  Devices/Equipment: Environmental consultant (specify type), Cane (specify quad or straight), Dentures (specify type), Eyeglasses, Grab bars in shower, Shower chair with back Home Equipment: Cane - single point  Prior Device Use: Indicate devices/aids used by the patient prior to current illness, exacerbation or injury? Walker  Current Functional Level Cognition  Overall Cognitive Status: Within Functional Limits for tasks assessed Orientation Level: Oriented X4    Extremity Assessment (includes Sensation/Coordination)  Upper Extremity Assessment: Overall WFL for tasks assessed  Lower Extremity Assessment: Generalized weakness    ADLs  Overall ADL's : Needs assistance/impaired Eating/Feeding: Independent, Sitting Grooming: Set up, Sitting Upper Body Bathing: Set up, Sitting Lower Body Bathing: Minimal assistance Lower Body Bathing Details (indicate cue type and reason): Mod A sit<>stand from recliner Upper Body Dressing : Set up, Sitting Lower Body Dressing: Moderate assistance Lower Body Dressing Details (indicate cue type and reason): Mod A sit<>stand from recliner Toilet Transfer: Minimal assistance, RW, BSC, Stand-pivot Toileting- Clothing Manipulation and Hygiene: Moderate assistance Toileting - Clothing Manipulation Details (indicate cue type and reason): Mod A sit<>stand from recliner    Mobility  Overal bed mobility: Needs Assistance Bed Mobility: Sit to Supine Sit to supine: Mod assist General bed mobility comments: Pt out of bed in recliner    Transfers  Overall transfer level: Needs assistance Equipment used: Rolling walker (2 wheeled) Transfers: Sit to/from Stand Sit to Stand: Mod assist(from recliner) General transfer comment: VCs for safe hand placement    Ambulation / Gait / Stairs / Wheelchair Mobility  Ambulation/Gait Ambulation/Gait assistance: Mod assist Gait Distance (Feet): 5 Feet Assistive device: Rolling walker (2 wheeled) Gait Pattern/deviations:  Step-to pattern, Antalgic, Decreased weight shift to right, Shuffle General Gait Details: patient only took a few steps back to bed Gait velocity: decreased    Posture / Balance Balance Overall balance assessment: Needs assistance Sitting-balance support: No upper extremity supported, Feet supported Sitting balance-Leahy Scale: Good Standing balance support: Bilateral upper extremity supported Standing balance-Leahy Scale: Poor Standing balance comment: reliant on RW and additional external support  Special needs/care consideration BiPAP/CPAP no CPM no Continuous Drip IV no Dialysis no        Days n/a Life Vest no Oxygen no Special Bed no Trach Size no Wound Vac (area) no      Location n/a Skin incision to RLE                              Location Bowel mgmt:  Bladder mgmt:  Diabetic mgmt: no Behavioral consideration  Chemo/radiation    Previous Home Environment (from acute therapy documentation) Living Arrangements: Alone Available Help at Discharge: Family, Available 24 hours/day(daughter and son-in-law coming to stay with patient) Type of Home: House Home Layout: Two level Alternate Level Stairs-Number of Steps: elevator to 2nd floor Home Access: Stairs to enter, Scientist, research (physical sciences) of Steps: 1 Bathroom Shower/Tub: Multimedia programmer: Handicapped height Home Care Services: No  Discharge Living Setting Plans for Discharge Living Setting: Patient's home Type of Home at Discharge: Other (Comment)(townhome) Discharge Home Layout: Two level Alternate Level Stairs-Rails: (elevator) Discharge Home Access: Stairs to enter Entrance Stairs-Rails: None Entrance Stairs-Number of Steps: 2 Discharge Bathroom Shower/Tub: Walk-in shower Discharge Bathroom Toilet: Handicapped height Discharge Bathroom Accessibility: Yes How Accessible: Accessible via walker Does the patient have any problems obtaining your medications?: No  Social/Family/Support  Systems Anticipated Caregiver: daughter, Brooks Sailors, and her husband Harrie Jeans, as well as pt's son Valeree Leidy (intermittently available) Anticipated Caregiver's Contact Information: [name] [phone] Ability/Limitations of Caregiver: live in [place] near [place], will have to plan travel down to [place] taking into account state lockdowns and travel restrictions Caregiver Availability: 24/7 Discharge Plan Discussed with Primary Caregiver: Yes Is Caregiver In Agreement with Plan?: Yes Does Caregiver/Family have Issues with Lodging/Transportation while Pt is in Rehab?: No  Goals/Additional Needs Patient/Family Goal for Rehab: PT/OT supervision to mod I Expected length of stay: 10-12 days Dietary Needs: reg/thin Additional Information: [name] will need time to plan trip from [place] to [place] so that she can get through travel restrictions related to Covid Pt/Family Agrees to Admission and willing to participate: Yes Program Orientation Provided & Reviewed with Pt/Caregiver Including Roles  & Responsibilities: Yes  Barriers to Discharge: Other (comments)(family travelling from [place] to provide 24/7)  Decrease burden of Care through IP rehab admission: n/a  Possible need for SNF placement upon discharge: no  Patient Condition: I have reviewed medical records from [facility], spoken with CM, and patient and daughter. I met with patient at the bedside and discussed via phone for inpatient rehabilitation assessment.  Patient will benefit from ongoing PT and OT, can actively participate in 3 hours of therapy a day 5 days of the week, and can make measurable gains during the admission.  Patient will also benefit from the coordinated team approach during an Inpatient Acute Rehabilitation admission.  The patient will receive intensive therapy as well as Rehabilitation physician, nursing, social worker, and care management interventions.  Due to safety, medication administration, pain management and patient  education the patient requires 24 hour a day rehabilitation nursing.  The patient is currently mod assist with mobility and basic ADLs.  Discharge setting and therapy post discharge at home is anticipated.  Patient has agreed to participate in the Acute Inpatient Rehabilitation Program and will admit today.  Preadmission Screen Completed By:  Michel Santee, PT, DPT 02/19/2019 11:17 AM ______________________________________________________________________   Discussed status with Dr. Naaman Plummer on 02/19/19  at 11:21 AM  and received  approval for admission today.  Admission Coordinator:  Michel Santee, PT, DPT time 11:21 AM Sudie Grumbling 02/19/19    Assessment/Plan: 1. Diagnosis: right IT hip fx 2. Does the need for close, 24 hr/day Medical supervision in concert with the patient's rehab needs make it unreasonable for this patient to be served in a less intensive setting? Yes 3. Co-Morbidities requiring supervision/potential complications: rectal cancer, hx of a fib with current hyperkalemia, PF, pain mgt, ABLA 4. Due to bladder management, bowel management, safety, skin/wound care, disease management, medication administration, pain management and patient education, does the patient require 24 hr/day rehab nursing? Yes 5. Does the patient require coordinated care of a physician, rehab nurse, PT, OT, and SLP to address physical and functional deficits in the context of the above medical diagnosis(es)? Yes Addressing deficits in the following areas: balance, endurance, locomotion, strength, transferring, bowel/bladder control, bathing, dressing, feeding, grooming, toileting and psychosocial support 6. Can the patient actively participate in an intensive therapy program of at least 3 hrs of therapy 5 days a week? Yes 7. The potential for patient to make measurable gains while on inpatient rehab is excellent 8. Anticipated functional outcomes upon discharge from inpatient rehab: modified independent and  supervision PT, modified independent and supervision OT, n/a SLP 9. Estimated rehab length of stay to reach the above functional goals is: 10-14 days 10. Anticipated discharge destination: Home 10. Overall Rehab/Functional Prognosis: excellent   MD Signature: Meredith Staggers, MD, [facility] 02/19/2019         Revision History

## 2019-02-19 NOTE — Evaluation (Signed)
Occupational Therapy Evaluation Patient Details Name: Hannah Mercado MRN: [id number] DOB: 1922-07-30 Today's Date: 02/19/2019    History of Present Illness Pt is a 85 y.o. female s/p fall at home resulting in displaced, comminuted right intertrochanteric femur fracture.  Pt underwent IM nail right femur on 02/17/19.  PMH signficant for atrial fibrillation on Eliquis, pulmonary fibrosis, coronary artery disease, hyperlipidemia, rectal cancer, status post pacemaker placement and peripheral neuropathy.   Clinical Impression   This 85 yo female admitted and underwent above presents to acute OT with a wonderful positive attitude, increased pain, decreased ROM RLE for ADLs, decreased mobility all affecting her PLOF of being totally independent with basic ADLs and most IADLs. She will benefit from acute OT with follow up on CIR to get to a S level or better to return home with her family.     Follow Up Recommendations  CIR;Supervision/Assistance - 24 hour    Equipment Recommendations  Other (comment)(TBD next venue)       Precautions / Restrictions Precautions Precautions: Fall Restrictions Weight Bearing Restrictions: No RLE Weight Bearing: Weight bearing as tolerated      Mobility Bed Mobility               General bed mobility comments: Pt out of bed in recliner  Transfers Overall transfer level: Needs assistance Equipment used: Rolling walker (2 wheeled) Transfers: Sit to/from Stand Sit to Stand: Mod assist(from recliner)         General transfer comment: VCs for safe hand placement    Balance Overall balance assessment: Needs assistance Sitting-balance support: No upper extremity supported;Feet supported Sitting balance-Leahy Scale: Good     Standing balance support: Bilateral upper extremity supported Standing balance-Leahy Scale: Poor Standing balance comment: reliant on RW and additional external support                           ADL  either performed or assessed with clinical judgement   ADL Overall ADL's : Needs assistance/impaired Eating/Feeding: Independent;Sitting   Grooming: Set up;Sitting   Upper Body Bathing: Set up;Sitting   Lower Body Bathing: Minimal assistance Lower Body Bathing Details (indicate cue type and reason): Mod A sit<>stand from recliner Upper Body Dressing : Set up;Sitting   Lower Body Dressing: Moderate assistance Lower Body Dressing Details (indicate cue type and reason): Mod A sit<>stand from recliner Toilet Transfer: Minimal assistance;RW;BSC;Stand-pivot   Toileting- Clothing Manipulation and Hygiene: Moderate assistance Toileting - Clothing Manipulation Details (indicate cue type and reason): Mod A sit<>stand from recliner             Vision Patient Visual Report: No change from baseline              Pertinent Vitals/Pain Pain Assessment: 0-10 Pain Score: 6  Pain Location: RLE Pain Descriptors / Indicators: Discomfort;Grimacing Pain Intervention(s): Limited activity within patient's tolerance;Repositioned;Ice applied     Hand Dominance Right   Extremity/Trunk Assessment Upper Extremity Assessment Upper Extremity Assessment: Overall WFL for tasks assessed           Communication Communication Communication: No difficulties   Cognition Arousal/Alertness: Awake/alert Behavior During Therapy: WFL for tasks assessed/performed Overall Cognitive Status: Within Functional Limits for tasks assessed  Home Living Family/patient expects to be discharged to:: Private residence Living Arrangements: Alone Available Help at Discharge: Family;Available 24 hours/day(daughter and son-in-law coming to stay with patient) Type of Home: House Home Access: Stairs to enter;Elevator     Home Layout: Two level Alternate Level Stairs-Number of Steps: elevator to 2nd floor   Bathroom Shower/Tub: Medical illustrator: Handicapped height     Home Equipment: Cane - single point          Prior Functioning/Environment Level of Independence: Independent with assistive device(s)        Comments: uses cane when out of house        OT Problem List: Decreased strength;Decreased range of motion;Impaired balance (sitting and/or standing);Pain;Decreased knowledge of use of DME or AE      OT Treatment/Interventions: Self-care/ADL training;DME and/or AE instruction;Patient/family education;Balance training    OT Goals(Current goals can be found in the care plan section) Acute Rehab OT Goals Patient Stated Goal: to go back home OT Goal Formulation: With patient Time For Goal Achievement: 03/05/19 Potential to Achieve Goals: Good  OT Frequency: Min 2X/week              AM-PAC OT "6 Clicks" Daily Activity     Outcome Measure Help from another person eating meals?: None Help from another person taking care of personal grooming?: A Little Help from another person toileting, which includes using toliet, bedpan, or urinal?: A Lot Help from another person bathing (including washing, rinsing, drying)?: A Little Help from another person to put on and taking off regular upper body clothing?: A Little Help from another person to put on and taking off regular lower body clothing?: A Lot 6 Click Score: 17   End of Session Equipment Utilized During Treatment: Gait belt;Rolling walker  Activity Tolerance: Patient tolerated treatment well Patient left: in chair;with call bell/phone within reach(pt already up in recliner when I entered, no chair alarm. Pt reports she knows not to get up by herself)  OT Visit Diagnosis: Unsteadiness on feet (R26.81);Other abnormalities of gait and mobility (R26.89);Muscle weakness (generalized) (M62.81);Pain Pain - Right/Left: Right Pain - part of body: Leg                Time: 0742-0801 OT Time Calculation (min): 19 min Charges:  OT General Charges $OT  Visit: 1 Visit OT Evaluation $OT Eval Moderate Complexity: 1 Mod  [address], OTR/L Acute [employer] Pager [phone] Office [phone]     Almon Register 02/19/2019, 8:15 AM

## 2019-02-19 NOTE — Discharge Summary (Signed)
Physician Discharge Summary  Hannah Mercado MWU:[id number] DOB: 08/26/1922 DOA: 02/15/2019  PCP: Patient, No Pcp Per  Admit date: 02/15/2019 Discharge date: 02/19/2019  Admitted From:  Home Disposition:  Acute Rehab.  Recommendations for Outpatient Follow-up:  1. Follow up with PCP in 1-2 weeks 2. Please obtain BMP/CBC in one week. 3. Please recheck her BMP 4. Please follow up on the following pending results: 5. BMP  Home Health: No Equipment/Devices: No Discharge Condition: Stable CODE STATUS:Full code Diet recommendation: Heart Healthy / Carb Modified / Regular / Dysphagia   Brief/Interim Summary: Hannah Esperanza Staruchis an 85 y.o.femalewith medical history significant ofrectal cancer, atrial fibrillation on Eliquis, hypertension, hyperlipidemia, CAD, pulmonary fibrosis, permanent pacemaker in place, presented with a fall, she was found to have right displaced intertrochanteric fracture on xray, Ortho has been consulted, she underwent Intramedullary nail placement in femur, tolerated well, No complication,  She is being discharged to acute rehab today. Her potassium was slightly elevated,  potassium is 5.7 - 6.0, Kayaxalate given, it will be followed up in rehab. It was discussed with rehab attending.  She was managed for below problems.  # Right intertrochanteric femur fracture, s/p fall: -s/pright intermedullary nail femur.post op day # 2,  - Analgesics as needed for pain.   - Follow-up with orthopedic surgeon as scheduled -  Possible DC in rehab today   # Hypertension:  Resume amlodipine, moniter BP  # Leukocytosis: This is likely reactive from recent surgery. Monitor CBC.  # CAD/ atrial fibrillation/permanent pacemaker in place: Eliquis resumed.  # Osteoporosis: Outpatient follow-up with PCP  # Pulmonary fibrosis/pulmonary hypertension: No acute pulmonary issues at this time.  # Stage III CKD: Creatinine is stable.  Severe protein  calorie malnutrition: Continue nutritional supplements. Follow-up with dietitian.  Discharge Diagnoses:  Principal Problem:   Closed hip fracture, right, initial encounter [facility]) Active Problems:   HYPERCHOLESTEROLEMIA   PULMONARY FIBROSIS, POSTINFLAMMATORY   pT2pN0 (0/12 lymph nodes) cM0 Rectal Cancer   Hypertension   Carotid artery disease (HCC)   Persistent atrial fibrillation (HCC)   Cardiac pacemaker in situ   Stage 3a chronic kidney disease   Protein-calorie malnutrition, severe  Discharge Instructions  Discharge Instructions    Call MD for:  difficulty breathing, headache or visual disturbances   Complete by: As directed    Call MD for:  persistant nausea and vomiting   Complete by: As directed    Call MD for:  severe uncontrolled pain   Complete by: As directed    Call MD for:  temperature >100.4   Complete by: As directed    Diet - low sodium heart healthy   Complete by: As directed    Discharge instructions   Complete by: As directed    Follow up PCP in one week. Check BMP and replace as needed.   Increase activity slowly   Complete by: As directed      Allergies as of 02/19/2019      Reactions   Sulfonamide Derivatives Nausea And Vomiting   Happened a long time ago, does not remember the other reaction she had.      Medication List    STOP taking these medications   omeprazole 20 MG capsule Commonly known as: PRILOSEC Replaced by: pantoprazole 40 MG tablet     TAKE these medications   albuterol 108 (90 Base) MCG/ACT inhaler Commonly known as: VENTOLIN HFA Inhale 2 puffs into the lungs every 6 (six) hours as needed. Wheezing   alendronate 70 MG  tablet Commonly known as: FOSAMAX Take 70 mg by mouth once a week.   amLODipine 5 MG tablet Commonly known as: NORVASC TAKE 1 TABLET BY MOUTH EVERY DAY   apixaban 2.5 MG Tabs tablet Commonly known as: Eliquis Take 1 tablet (2.5 mg total) by mouth 2 (two) times daily.   ascorbic acid 250 MG  Chew Commonly known as: VITAMIN C Chew 250 mg by mouth daily.   atorvastatin 20 MG tablet Commonly known as: LIPITOR Take 0.5 tablets (10 mg total) by mouth daily.   calcium-vitamin D 500-200 MG-UNIT tablet Take 2 tablets by mouth daily.   docusate sodium 100 MG capsule Commonly known as: COLACE Take 1 capsule (100 mg total) by mouth 2 (two) times daily.   furosemide 20 MG tablet Commonly known as: LASIX TAKE 1 TABLET BY MOUTH AS NEEDED FOR SWELLING What changed: See the new instructions.   gabapentin 300 MG capsule Commonly known as: NEURONTIN Take 1 capsule (300 mg total) by mouth at bedtime. What changed:   medication strength  how much to take  how to take this  when to take this  additional instructions   HYDROcodone-acetaminophen 5-325 MG tablet Commonly known as: NORCO/VICODIN Take 1-2 tablets by mouth every 6 (six) hours as needed for moderate pain.   methocarbamol 500 MG tablet Commonly known as: ROBAXIN Take 1 tablet (500 mg total) by mouth every 6 (six) hours as needed for muscle spasms.   pantoprazole 40 MG tablet Commonly known as: PROTONIX Take 1 tablet (40 mg total) by mouth daily. Start taking on: February 20, 2019 Replaces: omeprazole 20 MG capsule   vitamin B-12 250 MCG tablet Commonly known as: CYANOCOBALAMIN Take 250 mcg by mouth daily.   Vitamin D3 25 MCG (1000 UT) Caps Take 1 capsule by mouth daily.   vitamin E 200 UNIT capsule Generic drug: vitamin E Take 200 Units by mouth daily.      Follow-up Information    Swinteck, Aaron Edelman, MD. Schedule an appointment as soon as possible for a visit in 2 weeks.   Specialty: Orthopedic Surgery Why: For wound re-check Contact information: [address] [place] [address] [phone]        Constance Haw, MD Follow up in 1 week(s).   Specialty: Cardiology Contact information: [address] [place] [address] [phone]           Allergies  Allergen Reactions  . Sulfonamide Derivatives Nausea And Vomiting    Happened a long time ago, does not remember the other reaction she had.    Consultations:  [facility].   Procedures/Studies: Pelvis Portable  Result Date: 02/17/2019 CLINICAL DATA:  Postop day 0 ORIF comminuted intertrochanteric RIGHT femoral neck fracture EXAM: PORTABLE PELVIS 1-2 VIEWS COMPARISON:  02/15/2019. FINDINGS: ORIF of the comminuted intertrochanteric RIGHT femoral neck fracture with an intramedullary nail and compression screw. Marked improvement in alignment post ORIF. No acute complicating features. Mild osseous demineralization. Contralateral LEFT hip joint with relatively well-preserved joint space. IMPRESSION: 1. Marked improvement in alignment post ORIF of the comminuted intertrochanteric RIGHT femoral neck fracture. No acute complicating features. 2. Mild osseous demineralization. Electronically Signed   By: Evangeline Dakin M.D.   On: 02/17/2019 12:08   Dg Chest Port 1 View  Result Date: 02/15/2019 CLINICAL DATA:  Fall, hip fracture EXAM: PORTABLE CHEST 1 VIEW COMPARISON:  03/18/2018 FINDINGS: Increased interstitial markings. No focal consolidation. Left lower lobe scarring. No pleural effusion or pneumothorax. Cardiomegaly.  Left subclavian pacemaker.  IMPRESSION: No evidence of acute cardiopulmonary disease. Electronically Signed   By: Julian Hy M.D.   On: 02/15/2019 10:29   Dg Knee Right Port  Result Date: 02/15/2019 CLINICAL DATA:  Right leg pain secondary to a fall today. Right proximal femur fracture. EXAM: PORTABLE RIGHT KNEE - 1-2 VIEW COMPARISON:  None. FINDINGS: There is no fracture or dislocation. Slight arthritic changes in the medial and patellofemoral compartments. Extensive arterial calcification. IMPRESSION: No acute abnormality. Slight arthritic changes in the medial and patellofemoral compartments. Electronically Signed   By: Lorriane Shire M.D.   On: 02/15/2019  11:43   Dg C-arm 1-60 Min  Result Date: 02/17/2019 CLINICAL DATA:  IM nail RIGHT femur EXAM: DG C-ARM 1-60 MIN; RIGHT FEMUR 2 VIEWS COMPARISON:  None. FINDINGS: Four intraoperative fluoroscopic images are provided demonstrating IM nail placement within the RIGHT femur. Hardware appears intact and appropriately positioned. Osseous alignment is anatomic. Fluoroscopy provided for 1 minutes 58 seconds. IMPRESSION: Intraoperative fluoroscopic images demonstrating intramedullary nail placement within the RIGHT femur. No evidence of surgical complicating feature. Electronically Signed   By: Franki Cabot M.D.   On: 02/17/2019 09:55   Dg Hip Port Unilat W Or Wo Pelvis 1 View Right  Result Date: 02/15/2019 CLINICAL DATA:  Fall, right hip deformity EXAM: DG HIP (WITH OR WITHOUT PELVIS) 1V PORT RIGHT COMPARISON:  None. FINDINGS: Comminuted intertrochanteric right hip fracture. Varus angulation and foreshortening. Displaced lesser trochanteric fragment. Left hip is intact.  Bilateral hip joint spaces are preserved. Visualized bony pelvis appears intact. IMPRESSION: Comminuted intertrochanteric right hip fracture, as above. Electronically Signed   By: Julian Hy M.D.   On: 02/15/2019 10:28   Dg Femur, Min 2 Views Right  Result Date: 02/17/2019 CLINICAL DATA:  IM nail RIGHT femur EXAM: DG C-ARM 1-60 MIN; RIGHT FEMUR 2 VIEWS COMPARISON:  None. FINDINGS: Four intraoperative fluoroscopic images are provided demonstrating IM nail placement within the RIGHT femur. Hardware appears intact and appropriately positioned. Osseous alignment is anatomic. Fluoroscopy provided for 1 minutes 58 seconds. IMPRESSION: Intraoperative fluoroscopic images demonstrating intramedullary nail placement within the RIGHT femur. No evidence of surgical complicating feature. Electronically Signed   By: Franki Cabot M.D.   On: 02/17/2019 09:55   Dg Femur Port, Min 2 Views Right  Result Date: 02/17/2019 CLINICAL DATA:  Postop day 0  of ORIF of a comminuted intertrochanteric RIGHT femoral neck fracture. EXAM: RIGHT FEMUR PORTABLE 2 VIEW 10:35 a.m.: COMPARISON:  Intraoperative RIGHT femur x-rays earlier same day at 9:25 a.m. RIGHT hip x-rays 02/15/2019. FINDINGS: Marked improvement in alignment post ORIF of the comminuted intertrochanteric RIGHT femoral neck fracture with intramedullary nail and compression screw. No acute complicating features. IMPRESSION: Marked improvement in alignment post ORIF of the comminuted intertrochanteric RIGHT femoral neck fracture without acute complicating features. Electronically Signed   By: Evangeline Dakin M.D.   On: 02/17/2019 12:09    (intermedullary nail fixation right femur)    Subjective: Patient was seen / examined at bedside, she c/o pain when walking but improving. She is being discharged to rehab.   Discharge Exam: Vitals:   02/19/19 0617 02/19/19 0758  BP: (!) 142/62 (!) 129/52  Pulse: 61 66  Resp: 17 16  Temp: 98.5 F (36.9 C) 97.9 F (36.6 C)  SpO2: 97% 98%   Vitals:   02/18/19 1653 02/18/19 2040 02/19/19 0617 02/19/19 0758  BP: 129/62 (!) 134/56 (!) 142/62 (!) 129/52  Pulse: 62 61 61 66  Resp: 15 16 17 16   Temp: 98.3  F (36.8 C) 99.1 F (37.3 C) 98.5 F (36.9 C) 97.9 F (36.6 C)  TempSrc: Oral Oral Oral Oral  SpO2: 96% 96% 97% 98%  Weight:      Height:        General: Pt is alert, awake, not in acute distress Cardiovascular: RRR, S1/S2 +, no rubs, no gallops Respiratory: CTA bilaterally, no wheezing, no rhonchi Abdominal: Soft, NT, ND, bowel sounds + Extremities: no edema, no cyanosis, Right thigh pain.    The results of significant diagnostics from this hospitalization (including imaging, microbiology, ancillary and laboratory) are listed below for reference.     Microbiology: Recent Results (from the past 240 hour(s))  Surgical pcr screen     Status: None   Collection Time: 02/15/19  3:24 PM   Specimen: Nasal Mucosa; Nasal Swab  Result Value Ref  Range Status   MRSA, PCR NEGATIVE NEGATIVE Final   Staphylococcus aureus NEGATIVE NEGATIVE Final    Comment: (NOTE) The Xpert SA Assay (FDA approved for NASAL specimens in patients 49 years of age and older), is one component of a comprehensive surveillance program. It is not intended to diagnose infection nor to guide or monitor treatment. Performed at [facility], [place] [address] [place], [place] [address]      Labs: BNP (last 3 results) No results for input(s): BNP in the last 8760 hours. Basic Metabolic Panel: Recent Labs  Lab 02/15/19 0945 02/16/19 0407 02/17/19 1144 02/18/19 0204 02/19/19 0458 02/19/19 0958  NA 139 140  --  135 137  --   K 4.5 4.4  --  5.1 5.7* 6.0*  CL 105 104  --  97* 93*  --   CO2 25 26  --  27 31  --   GLUCOSE 156* 107*  --  167* 174*  --   BUN 15 20  --  36* 53*  --   CREATININE 1.05* 1.08* 0.97 1.05* 1.21*  --   CALCIUM 8.6* 8.3*  --  8.6* 9.0  --    Liver Function Tests: No results for input(s): AST, ALT, ALKPHOS, BILITOT, PROT, ALBUMIN in the last 168 hours. No results for input(s): LIPASE, AMYLASE in the last 168 hours. No results for input(s): AMMONIA in the last 168 hours. CBC: Recent Labs  Lab 02/15/19 0945  02/17/19 0313 02/17/19 1144 02/18/19 0204 02/18/19 1026 02/19/19 0458  WBC 15.6*   < > 9.2 14.2* 11.5* 12.9* 10.2  NEUTROABS 13.6*  --  6.9  --   --   --   --   HGB 11.9*   < > 9.6* 9.8* 8.7* 9.2* 9.7*  HCT 37.9   < > 28.8* 30.3* 26.7* 27.8* 29.3*  MCV 108.3*   < > 103.2* 105.6* 103.9* 103.0* 102.8*  PLT 226   < > 185 191 181 232 266   < > = values in this interval not displayed.   Cardiac Enzymes: No results for input(s): CKTOTAL, CKMB, CKMBINDEX, TROPONINI in the last 168 hours. BNP: Invalid input(s): POCBNP CBG: No results for input(s): GLUCAP in the last 168 hours. D-Dimer No results for input(s): DDIMER in the last 72 hours. Hgb A1c No results for input(s): HGBA1C in the last 72 hours. Lipid  Profile No results for input(s): CHOL, HDL, LDLCALC, TRIG, CHOLHDL, LDLDIRECT in the last 72 hours. Thyroid function studies No results for input(s): TSH, T4TOTAL, T3FREE, THYROIDAB in the last 72 hours.  Invalid input(s): FREET3 Anemia work up No results for input(s): VITAMINB12, FOLATE, FERRITIN,  TIBC, IRON, RETICCTPCT in the last 72 hours. Urinalysis    Component Value Date/Time   COLORURINE YELLOW 08/24/2016 1020   APPEARANCEUR CLEAR 08/24/2016 1020   LABSPEC 1.025 08/24/2016 1020   PHURINE 5.5 08/24/2016 1020   GLUCOSEU NEGATIVE 08/24/2016 1020   HGBUR NEGATIVE 08/24/2016 1020   BILIRUBINUR NEGATIVE 08/24/2016 1020   KETONESUR NEGATIVE 08/24/2016 1020   PROTEINUR NEGATIVE 02/01/2011 1545   UROBILINOGEN 0.2 08/24/2016 1020   NITRITE POSITIVE (A) 08/24/2016 1020   LEUKOCYTESUR MODERATE (A) 08/24/2016 1020   Sepsis Labs Invalid input(s): PROCALCITONIN,  WBC,  LACTICIDVEN Microbiology Recent Results (from the past 240 hour(s))  Surgical pcr screen     Status: None   Collection Time: 02/15/19  3:24 PM   Specimen: Nasal Mucosa; Nasal Swab  Result Value Ref Range Status   MRSA, PCR NEGATIVE NEGATIVE Final   Staphylococcus aureus NEGATIVE NEGATIVE Final    Comment: (NOTE) The Xpert SA Assay (FDA approved for NASAL specimens in patients 68 years of age and older), is one component of a comprehensive surveillance program. It is not intended to diagnose infection nor to guide or monitor treatment. Performed at [facility], [place] [address], [place], [place] [address]      Time coordinating discharge: Over 30 minutes  SIGNED:   Shawna Clamp, MD  [facility] 02/19/2019, 11:53 AM Pager   If 7PM-7AM, please contact night-coverage [email or web address]

## 2019-02-19 NOTE — H&P (Signed)
Physical Medicine and Rehabilitation Admission H&P        Chief Complaint  Patient presents with  . Fall  . Leg Injury  : HPI: Hannah Mercado is a 85 year old right-handed female with history of rectal cancer, atrial fibrillation with pacemaker maintained on Eliquis, hypertension, hyperlipidemia, chronic renal insufficiency creatinine 1.05, pulmonary fibrosis.  Per chart review patient lives alone.  Independent with assistive device.  Daughter and son-in-law planning to assist as needed on discharge.  Presented 02/15/2019 after mechanical fall without loss of consciousness.  X-rays and imaging revealed right intertrochanteric femur fracture.  Patient was cleared for surgery and underwent IM fixation of right femur 02/17/2019 per Dr. Lyla Glassing.  Weightbearing as tolerated right lower extremity.  Hospital course pain management.  Chronic Eliquis has been resumed.  Acute blood loss anemia 9.7 and monitored.  Noted hyperkalemia 5.7/6.0 and patient did receive Kayexalate as well as monitoring of creatinine 1.21.Therapy evaluation completed and patient was admitted for a comprehensive rehab program   Review of Systems  Constitutional: Negative for chills and fever.  HENT: Negative for hearing loss.   Eyes: Negative for blurred vision and double vision.  Respiratory: Positive for shortness of breath. Negative for cough.   Cardiovascular: Positive for palpitations and leg swelling. Negative for chest pain.  Gastrointestinal: Positive for constipation. Negative for heartburn and nausea.       GERD  Genitourinary: Negative for dysuria, flank pain and hematuria.  Musculoskeletal: Positive for falls, joint pain and myalgias.  Skin: Negative for rash.  Psychiatric/Behavioral:       Anxiety  All other systems reviewed and are negative.       Past Medical History:  Diagnosis Date  . Allergy history unknown    . Anxiety    . Asthmatic bronchitis      use inhaler prn   . Atrial fibrillation  ([place]) 10/16/2015  . Blood transfusion      ? with ectopic pregnancy 50 yrs ago   . Cardiac pacemaker in situ 02/24/2016  . Carotid artery disease ([place]) 03/12/2015  . Chronic venous insufficiency 09/11/2015  . Diaphragmatic hernia 07/31/2008    Qualifier: Diagnosis of  By: Lenna Gilford MD, Deborra Medina   . Diverticulosis of colon without hemorrhage 08/02/2012  . DJD (degenerative joint disease)    . Edema 09/11/2015  . GERD (gastroesophageal reflux disease)    . Hiatal hernia    . History of pneumonia      right middle lobe  . Hypercholesteremia    . HYPERCHOLESTEROLEMIA 04/19/2007    Qualifier: Diagnosis of  By: Julien Girt CMA, Marliss Czar    . Hypertension 04/14/2011  . Mitral valve disorder 04/20/2007    Qualifier: Diagnosis of  By: Lenna Gilford MD, Deborra Medina   . Osteopenia    . Persistent atrial fibrillation ([place])    . PULMONARY FIBROSIS, POSTINFLAMMATORY 07/31/2008    Qualifier: Diagnosis of  By: Lenna Gilford MD, Deborra Medina   . Rectal cancer ([place])    . Right carotid bruit 08/28/2014         Past Surgical History:  Procedure Laterality Date  . APPENDECTOMY      . COLON RESECTION   02/08/2011    Procedure: LAPAROSCOPIC SIGMOID COLON RESECTION;  Surgeon: Stark Klein, MD;  Location: [facility];  Service: General;  Laterality: N/A;  Laparoscopic Lower Anterior Bowel Resection  . COLON SURGERY      . COLONOSCOPY   11/27/10    rectal cancer, small sigmoid adenoma, diverticulosis  .  ECTOPIC PREGNANCY SURGERY        Midline incision  . EP IMPLANTABLE DEVICE N/A 10/16/2015    Procedure: Pacemaker Implant;  Surgeon: Will Meredith Leeds, MD;  Location: [facility];  Service: Cardiovascular;  Laterality: N/A;  . HAMMER TOE SURGERY   1995    Dr. Silverio Decamp  . Laparoscopic Left oophorectomy, laparoscopic low anterior resection, placement of OnQ pain pump   02/08/2011  . NASAL SINUS SURGERY   1994  . OVARIAN CYST REMOVAL   02/08/2011    Procedure: OVARIAN CYSTECTOMY;  Surgeon: Stark Klein, MD;  Location: [facility];  Service:  General;  Laterality: Left;  Removal of Left Ovary and Pelvic Mass  . VAGINAL HYSTERECTOMY   1980's         Family History  Problem Relation Age of Onset  . Heart disease Father    . Lung cancer Brother          x 2  . Colon cancer Neg Hx      Social History:  reports that she has never smoked. She has never used smokeless tobacco. She reports current alcohol use. She reports that she does not use drugs. Allergies:       Allergies  Allergen Reactions  . Sulfonamide Derivatives Nausea And Vomiting      Happened a long time ago, does not remember the other reaction she had.          Medications Prior to Admission  Medication Sig Dispense Refill  . albuterol (PROVENTIL HFA;VENTOLIN HFA) 108 (90 BASE) MCG/ACT inhaler Inhale 2 puffs into the lungs every 6 (six) hours as needed. Wheezing        . alendronate (FOSAMAX) 70 MG tablet Take 70 mg by mouth once a week.      Marland Kitchen amLODipine (NORVASC) 5 MG tablet TAKE 1 TABLET BY MOUTH EVERY DAY (Patient taking differently: Take 5 mg by mouth daily. ) 30 tablet 5  . apixaban (ELIQUIS) 2.5 MG TABS tablet Take 1 tablet (2.5 mg total) by mouth 2 (two) times daily. 60 tablet 5  . ascorbic acid (VITAMIN C) 250 MG CHEW Chew 250 mg by mouth daily.       Marland Kitchen atorvastatin (LIPITOR) 20 MG tablet Take 0.5 tablets (10 mg total) by mouth daily. 90 tablet 1  . Calcium Carbonate-Vitamin D (CALCIUM-VITAMIN D) 500-200 MG-UNIT per tablet Take 2 tablets by mouth daily.       . Cholecalciferol (VITAMIN D3) 1000 UNITS CAPS Take 1 capsule by mouth daily.       . furosemide (LASIX) 20 MG tablet TAKE 1 TABLET BY MOUTH AS NEEDED FOR SWELLING (Patient taking differently: Take 20 mg by mouth daily as needed for fluid (swelling). ) 30 tablet 2  . vitamin B-12 (CYANOCOBALAMIN) 250 MCG tablet Take 250 mcg by mouth daily.       . vitamin E (VITAMIN E) 200 UNIT capsule Take 200 Units by mouth daily.           Drug Regimen Review Drug regimen was reviewed and remains appropriate  with no significant issues identified   Home: Home Living Family/patient expects to be discharged to:: Private residence Living Arrangements: Alone Available Help at Discharge: Family, Available 24 hours/day(daughter and son-in-law coming to stay with patient) Type of Home: House Home Access: Stairs to enter, Scientist, research (physical sciences) of Steps: 1 Home Layout: Two level Alternate Level Stairs-Number of Steps: elevator to 2nd floor Bathroom Shower/Tub: Multimedia programmer: Handicapped height Home Equipment:  Cane - single point   Functional History: Prior Function Level of Independence: Independent with assistive device(s) Comments: uses cane when out of house   Functional Status:  Mobility: Bed Mobility Overal bed mobility: Needs Assistance Bed Mobility: Sit to Supine Sit to supine: Mod assist General bed mobility comments: Pt out of bed in recliner Transfers Overall transfer level: Needs assistance Equipment used: Rolling walker (2 wheeled) Transfers: Sit to/from Stand Sit to Stand: Mod assist(from recliner) General transfer comment: VCs for safe hand placement Ambulation/Gait Ambulation/Gait assistance: Mod assist Gait Distance (Feet): 5 Feet Assistive device: Rolling walker (2 wheeled) Gait Pattern/deviations: Step-to pattern, Antalgic, Decreased weight shift to right, Shuffle General Gait Details: patient only took a few steps back to bed Gait velocity: decreased   ADL: ADL Overall ADL's : Needs assistance/impaired Eating/Feeding: Independent, Sitting Grooming: Set up, Sitting Upper Body Bathing: Set up, Sitting Lower Body Bathing: Minimal assistance Lower Body Bathing Details (indicate cue type and reason): Mod A sit<>stand from recliner Upper Body Dressing : Set up, Sitting Lower Body Dressing: Moderate assistance Lower Body Dressing Details (indicate cue type and reason): Mod A sit<>stand from recliner Toilet Transfer: Minimal assistance, RW,  BSC, Stand-pivot Toileting- Clothing Manipulation and Hygiene: Moderate assistance Toileting - Clothing Manipulation Details (indicate cue type and reason): Mod A sit<>stand from recliner   Cognition: Cognition Overall Cognitive Status: Within Functional Limits for tasks assessed Orientation Level: Oriented X4 Cognition Arousal/Alertness: Awake/alert Behavior During Therapy: WFL for tasks assessed/performed Overall Cognitive Status: Within Functional Limits for tasks assessed   Physical Exam: Blood pressure (!) 129/52, pulse 66, temperature 97.9 F (36.6 C), temperature source Oral, resp. rate 16, height 5\' 7"  (1.702 m), weight 56.7 kg, SpO2 98 %. Physical Exam  Constitutional: She is oriented to person, place, and time. She appears well-developed and well-nourished. No distress.  HENT:  Head: Normocephalic and atraumatic.  Eyes: Pupils are equal, round, and reactive to light. EOM are normal.  Neck: Normal range of motion. No tracheal deviation present. No thyromegaly present.  Cardiovascular: Normal rate and regular rhythm. Exam reveals no friction rub.  No murmur heard. Respiratory: Effort normal. No respiratory distress. She has no wheezes. She has no rales.  GI: Soft. She exhibits no distension. There is no abdominal tenderness. There is no rebound.  Musculoskeletal:     Comments: Right thigh with edema, tender to palpation and with ROM  Neurological: She is alert and oriented to person, place, and time. She has normal reflexes. No cranial nerve deficit. Coordination normal.  UE 5/5. LLE 4-/5 prox to 5/5 distally. RLE: 1-2/5 HF, KE and 4-/5 distally. Decreased LT/pain to mid calf bilaterally. DTR's 1+.   Skin:  Hip incisions dry with glue/foam dressings right thigh.   Psychiatric: She has a normal mood and affect. Her behavior is normal. Judgment and thought content normal.      Lab Results Last 48 Hours        Results for orders placed or performed during the hospital  encounter of 02/15/19 (from the past 48 hour(s))  CBC     Status: Abnormal    Collection Time: 02/17/19 11:44 AM  Result Value Ref Range    WBC 14.2 (H) 4.0 - 10.5 K/uL    RBC 2.87 (L) 3.87 - 5.11 MIL/uL    Hemoglobin 9.8 (L) 12.0 - 15.0 g/dL    HCT 30.3 (L) 36.0 - 46.0 %    MCV 105.6 (H) 80.0 - 100.0 fL    MCH 34.1 (H) 26.0 -  34.0 pg    MCHC 32.3 30.0 - 36.0 g/dL    RDW 13.1 11.5 - 15.5 %    Platelets 191 150 - 400 K/uL    nRBC 0.0 0.0 - 0.2 %      Comment: Performed at [facility], [place] [address], [place], [place] [address]  Creatinine, serum     Status: Abnormal    Collection Time: 02/17/19 11:44 AM  Result Value Ref Range    Creatinine, Ser 0.97 0.44 - 1.00 mg/dL    GFR calc non Af Amer 50 (L) >60 mL/min    GFR calc Af Amer 58 (L) >60 mL/min      Comment: Performed at [facility] [address], [place], [place] [address]  CBC     Status: Abnormal    Collection Time: 02/18/19  2:04 AM  Result Value Ref Range    WBC 11.5 (H) 4.0 - 10.5 K/uL    RBC 2.57 (L) 3.87 - 5.11 MIL/uL    Hemoglobin 8.7 (L) 12.0 - 15.0 g/dL    HCT 26.7 (L) 36.0 - 46.0 %    MCV 103.9 (H) 80.0 - 100.0 fL    MCH 33.9 26.0 - 34.0 pg    MCHC 32.6 30.0 - 36.0 g/dL    RDW 13.0 11.5 - 15.5 %    Platelets 181 150 - 400 K/uL    nRBC 0.0 0.0 - 0.2 %      Comment: Performed at [facility], [place] [address], [place], [place] [address]  Basic metabolic panel     Status: Abnormal    Collection Time: 02/18/19  2:04 AM  Result Value Ref Range    Sodium 135 135 - 145 mmol/L    Potassium 5.1 3.5 - 5.1 mmol/L    Chloride 97 (L) 98 - 111 mmol/L    CO2 27 22 - 32 mmol/L    Glucose, Bld 167 (H) 70 - 99 mg/dL    BUN 36 (H) 8 - 23 mg/dL    Creatinine, Ser 1.05 (H) 0.44 - 1.00 mg/dL    Calcium 8.6 (L) 8.9 - 10.3 mg/dL    GFR calc non Af Amer 45 (L) >60 mL/min    GFR calc Af Amer 53 (L) >60 mL/min    Anion gap 11 5 - 15      Comment: Performed at [facility] [address],  [place], [place] [address]  CBC     Status: Abnormal    Collection Time: 02/18/19 10:26 AM  Result Value Ref Range    WBC 12.9 (H) 4.0 - 10.5 K/uL    RBC 2.70 (L) 3.87 - 5.11 MIL/uL    Hemoglobin 9.2 (L) 12.0 - 15.0 g/dL    HCT 27.8 (L) 36.0 - 46.0 %    MCV 103.0 (H) 80.0 - 100.0 fL    MCH 34.1 (H) 26.0 - 34.0 pg    MCHC 33.1 30.0 - 36.0 g/dL    RDW 13.2 11.5 - 15.5 %    Platelets 232 150 - 400 K/uL    nRBC 0.0 0.0 - 0.2 %      Comment: Performed at [facility], [place] [address], [place] [address]  CBC     Status: Abnormal    Collection Time: 02/19/19  4:58 AM  Result Value Ref Range    WBC 10.2 4.0 - 10.5 K/uL    RBC 2.85 (L) 3.87 - 5.11 MIL/uL  Hemoglobin 9.7 (L) 12.0 - 15.0 g/dL    HCT 29.3 (L) 36.0 - 46.0 %    MCV 102.8 (H) 80.0 - 100.0 fL    MCH 34.0 26.0 - 34.0 pg    MCHC 33.1 30.0 - 36.0 g/dL    RDW 13.1 11.5 - 15.5 %    Platelets 266 150 - 400 K/uL    nRBC 0.0 0.0 - 0.2 %      Comment: Performed at [facility] [address], [place], [place] [address]  Basic metabolic panel     Status: Abnormal    Collection Time: 02/19/19  4:58 AM  Result Value Ref Range    Sodium 137 135 - 145 mmol/L    Potassium 5.7 (H) 3.5 - 5.1 mmol/L    Chloride 93 (L) 98 - 111 mmol/L    CO2 31 22 - 32 mmol/L    Glucose, Bld 174 (H) 70 - 99 mg/dL    BUN 53 (H) 8 - 23 mg/dL    Creatinine, Ser 1.21 (H) 0.44 - 1.00 mg/dL    Calcium 9.0 8.9 - 10.3 mg/dL    GFR calc non Af Amer 38 (L) >60 mL/min    GFR calc Af Amer 44 (L) >60 mL/min    Anion gap 13 5 - 15      Comment: Performed at [facility] [address] [place], [place] [address]      Imaging Results (Last 48 hours)  Pelvis Portable   Result Date: 02/17/2019 CLINICAL DATA:  Postop day 0 ORIF comminuted intertrochanteric RIGHT femoral neck fracture EXAM: PORTABLE PELVIS 1-2 VIEWS COMPARISON:  02/15/2019. FINDINGS: ORIF of the comminuted intertrochanteric RIGHT femoral neck fracture with an intramedullary nail  and compression screw. Marked improvement in alignment post ORIF. No acute complicating features. Mild osseous demineralization. Contralateral LEFT hip joint with relatively well-preserved joint space. IMPRESSION: 1. Marked improvement in alignment post ORIF of the comminuted intertrochanteric RIGHT femoral neck fracture. No acute complicating features. 2. Mild osseous demineralization. Electronically Signed   By: Evangeline Dakin M.D.   On: 02/17/2019 12:08    Dg Femur [place] 2 Views Right   Result Date: 02/17/2019 CLINICAL DATA:  Postop day 0 of ORIF of a comminuted intertrochanteric RIGHT femoral neck fracture. EXAM: RIGHT FEMUR PORTABLE 2 VIEW 10:35 a.m.: COMPARISON:  Intraoperative RIGHT femur x-rays earlier same day at 9:25 a.m. RIGHT hip x-rays 02/15/2019. FINDINGS: Marked improvement in alignment post ORIF of the comminuted intertrochanteric RIGHT femoral neck fracture with intramedullary nail and compression screw. No acute complicating features. IMPRESSION: Marked improvement in alignment post ORIF of the comminuted intertrochanteric RIGHT femoral neck fracture without acute complicating features. Electronically Signed   By: Evangeline Dakin M.D.   On: 02/17/2019 12:09            Medical Problem List and Plan: 1.  Decreased functional mobility secondary to right intertrochanteric femur fracture.  Status post IM fixation 02/17/2019.  Weightbearing as tolerated             -patient may   shower             -ELOS/Goals: 10-14 days 2.  Antithrombotics: -DVT/anticoagulation: Chronic Eliquis             -antiplatelet therapy: N/A 3. Pain Management: Neurontin 300 mg nightly, hydrocodone and Robaxin as needed             -may need to schedule meds with therapies---observe for now 4. Mood:  Provide emotional support             -antipsychotic agents: N/A 5. Neuropsych: This patient is capable of making decisions on her own behalf. 6. Skin/Wound Care: Routine skin checks 7.  Fluids/Electrolytes/Nutrition: Routine in and outs with follow-up chemistries on admit.             -kayexalate for hyperkalemia             -unclear origin of hyper k+, Cr 1.21             -recheck in AM 8.  Acute blood loss anemia.  Follow-up CBC 9.  Atrial fibrillation with pacemaker.  Cardiac rate controlled at present.              -monitor HGB 10.  Hypertension.  Norvasc 2.5 mg daily 11.  Pulmonary fibrosis.  Continue inhalers as directed.  Check oxygen saturations every shift 12.  Hyperlipidemia.  Lipitor 13.  CKD stage III.  Follow-up chemistries on admit 14.  GERD.  Protonix 15.  Constipation.  Laxative assistance             -diet discussed             -pt did have an episode of n/v this morning after breakfast---may have been d/t meds           Cathlyn Parsons, PA-C 02/19/2019  I have personally performed a face to face diagnostic evaluation of this patient and formulated the key components of the plan.  Additionally, I have personally reviewed laboratory data, imaging studies, as well as relevant notes and concur with the physician assistant's documentation above.  The patient's status has not changed from the original H&P.  Any changes in documentation from the acute care chart have been noted above.  Meredith Staggers, MD, Mell[facility] Drown

## 2019-02-19 NOTE — Care Management Important Message (Signed)
Important Message  Patient Details  Name: Hannah Mercado MRN: [id number] Date of Birth: 08/27/1922   Medicare Important Message Given:  Yes     Memory Argue 02/19/2019, 1:53 PM

## 2019-02-19 NOTE — Progress Notes (Addendum)
Inpatient Rehab Admissions:  Inpatient Rehab Consult received.  I met with patient at the bedside for rehabilitation assessment and to discuss goals and expectations of an inpatient rehab admission.  I also spoke to her daughter, [name], and son in law, Harrie Jeans, over the phone.  They are all in agreement for CIR and Cheryl/Chuck plan to travel down from [place] to provide assist for pt at d/c from CIR. I have contacted Dr. Dwyane Dee to see if pt is ready to admit to CIR today.   Addendum: I have approval to admit pt to CIR today.  Will let pt/family, and Cm know.   Signed: Shann Medal, PT, DPT Admissions Coordinator [phone] 02/19/19  11:17 AM

## 2019-02-20 ENCOUNTER — Inpatient Hospital Stay (HOSPITAL_COMMUNITY): Payer: Medicare Other

## 2019-02-20 ENCOUNTER — Encounter (HOSPITAL_COMMUNITY): Payer: Self-pay | Admitting: Internal Medicine

## 2019-02-20 ENCOUNTER — Inpatient Hospital Stay (HOSPITAL_COMMUNITY): Payer: Medicare Other | Admitting: Physical Therapy

## 2019-02-20 ENCOUNTER — Inpatient Hospital Stay (HOSPITAL_COMMUNITY)
Admission: RE | Admit: 2019-02-20 | Discharge: 2019-03-23 | DRG: 388 | Disposition: E | Payer: Medicare Other | Source: Intra-hospital | Attending: Internal Medicine | Admitting: Internal Medicine

## 2019-02-20 DIAGNOSIS — Z515 Encounter for palliative care: Secondary | ICD-10-CM | POA: Diagnosis not present

## 2019-02-20 DIAGNOSIS — Y95 Nosocomial condition: Secondary | ICD-10-CM | POA: Diagnosis not present

## 2019-02-20 DIAGNOSIS — E43 Unspecified severe protein-calorie malnutrition: Secondary | ICD-10-CM | POA: Diagnosis not present

## 2019-02-20 DIAGNOSIS — N189 Chronic kidney disease, unspecified: Secondary | ICD-10-CM | POA: Diagnosis not present

## 2019-02-20 DIAGNOSIS — K920 Hematemesis: Secondary | ICD-10-CM | POA: Diagnosis present

## 2019-02-20 DIAGNOSIS — I498 Other specified cardiac arrhythmias: Secondary | ICD-10-CM | POA: Diagnosis not present

## 2019-02-20 DIAGNOSIS — E872 Acidosis: Secondary | ICD-10-CM | POA: Diagnosis present

## 2019-02-20 DIAGNOSIS — R112 Nausea with vomiting, unspecified: Secondary | ICD-10-CM | POA: Diagnosis not present

## 2019-02-20 DIAGNOSIS — J810 Acute pulmonary edema: Secondary | ICD-10-CM | POA: Diagnosis present

## 2019-02-20 DIAGNOSIS — Z801 Family history of malignant neoplasm of trachea, bronchus and lung: Secondary | ICD-10-CM

## 2019-02-20 DIAGNOSIS — I2510 Atherosclerotic heart disease of native coronary artery without angina pectoris: Secondary | ICD-10-CM | POA: Diagnosis present

## 2019-02-20 DIAGNOSIS — W19XXXD Unspecified fall, subsequent encounter: Secondary | ICD-10-CM | POA: Diagnosis present

## 2019-02-20 DIAGNOSIS — A419 Sepsis, unspecified organism: Secondary | ICD-10-CM | POA: Diagnosis not present

## 2019-02-20 DIAGNOSIS — I509 Heart failure, unspecified: Secondary | ICD-10-CM

## 2019-02-20 DIAGNOSIS — K567 Ileus, unspecified: Principal | ICD-10-CM

## 2019-02-20 DIAGNOSIS — N184 Chronic kidney disease, stage 4 (severe): Secondary | ICD-10-CM | POA: Diagnosis present

## 2019-02-20 DIAGNOSIS — I10 Essential (primary) hypertension: Secondary | ICD-10-CM | POA: Diagnosis not present

## 2019-02-20 DIAGNOSIS — D631 Anemia in chronic kidney disease: Secondary | ICD-10-CM | POA: Diagnosis present

## 2019-02-20 DIAGNOSIS — Z7189 Other specified counseling: Secondary | ICD-10-CM | POA: Diagnosis not present

## 2019-02-20 DIAGNOSIS — J90 Pleural effusion, not elsewhere classified: Secondary | ICD-10-CM

## 2019-02-20 DIAGNOSIS — Z950 Presence of cardiac pacemaker: Secondary | ICD-10-CM

## 2019-02-20 DIAGNOSIS — E875 Hyperkalemia: Secondary | ICD-10-CM | POA: Diagnosis present

## 2019-02-20 DIAGNOSIS — D62 Acute posthemorrhagic anemia: Secondary | ICD-10-CM | POA: Diagnosis present

## 2019-02-20 DIAGNOSIS — R54 Age-related physical debility: Secondary | ICD-10-CM | POA: Diagnosis present

## 2019-02-20 DIAGNOSIS — K9189 Other postprocedural complications and disorders of digestive system: Secondary | ICD-10-CM

## 2019-02-20 DIAGNOSIS — K56609 Unspecified intestinal obstruction, unspecified as to partial versus complete obstruction: Secondary | ICD-10-CM

## 2019-02-20 DIAGNOSIS — S72141D Displaced intertrochanteric fracture of right femur, subsequent encounter for closed fracture with routine healing: Secondary | ICD-10-CM

## 2019-02-20 DIAGNOSIS — Z66 Do not resuscitate: Secondary | ICD-10-CM | POA: Diagnosis not present

## 2019-02-20 DIAGNOSIS — Z9049 Acquired absence of other specified parts of digestive tract: Secondary | ICD-10-CM

## 2019-02-20 DIAGNOSIS — Z20828 Contact with and (suspected) exposure to other viral communicable diseases: Secondary | ICD-10-CM | POA: Diagnosis present

## 2019-02-20 DIAGNOSIS — J189 Pneumonia, unspecified organism: Secondary | ICD-10-CM

## 2019-02-20 DIAGNOSIS — I4819 Other persistent atrial fibrillation: Secondary | ICD-10-CM | POA: Diagnosis not present

## 2019-02-20 DIAGNOSIS — Z79891 Long term (current) use of opiate analgesic: Secondary | ICD-10-CM

## 2019-02-20 DIAGNOSIS — Z5329 Procedure and treatment not carried out because of patient's decision for other reasons: Secondary | ICD-10-CM | POA: Diagnosis present

## 2019-02-20 DIAGNOSIS — M8580 Other specified disorders of bone density and structure, unspecified site: Secondary | ICD-10-CM | POA: Diagnosis present

## 2019-02-20 DIAGNOSIS — E870 Hyperosmolality and hypernatremia: Secondary | ICD-10-CM | POA: Diagnosis present

## 2019-02-20 DIAGNOSIS — R1084 Generalized abdominal pain: Secondary | ICD-10-CM | POA: Diagnosis not present

## 2019-02-20 DIAGNOSIS — F419 Anxiety disorder, unspecified: Secondary | ICD-10-CM | POA: Diagnosis present

## 2019-02-20 DIAGNOSIS — R1110 Vomiting, unspecified: Secondary | ICD-10-CM | POA: Diagnosis not present

## 2019-02-20 DIAGNOSIS — Z882 Allergy status to sulfonamides status: Secondary | ICD-10-CM

## 2019-02-20 DIAGNOSIS — Z8719 Personal history of other diseases of the digestive system: Secondary | ICD-10-CM

## 2019-02-20 DIAGNOSIS — E860 Dehydration: Secondary | ICD-10-CM | POA: Diagnosis not present

## 2019-02-20 DIAGNOSIS — J9621 Acute and chronic respiratory failure with hypoxia: Secondary | ICD-10-CM | POA: Diagnosis present

## 2019-02-20 DIAGNOSIS — E876 Hypokalemia: Secondary | ICD-10-CM | POA: Diagnosis present

## 2019-02-20 DIAGNOSIS — Z90710 Acquired absence of both cervix and uterus: Secondary | ICD-10-CM

## 2019-02-20 DIAGNOSIS — E7800 Pure hypercholesterolemia, unspecified: Secondary | ICD-10-CM | POA: Diagnosis present

## 2019-02-20 DIAGNOSIS — I472 Ventricular tachycardia: Secondary | ICD-10-CM | POA: Diagnosis not present

## 2019-02-20 DIAGNOSIS — Z8249 Family history of ischemic heart disease and other diseases of the circulatory system: Secondary | ICD-10-CM

## 2019-02-20 DIAGNOSIS — Z7983 Long term (current) use of bisphosphonates: Secondary | ICD-10-CM

## 2019-02-20 DIAGNOSIS — I059 Rheumatic mitral valve disease, unspecified: Secondary | ICD-10-CM | POA: Diagnosis present

## 2019-02-20 DIAGNOSIS — I272 Pulmonary hypertension, unspecified: Secondary | ICD-10-CM | POA: Diagnosis not present

## 2019-02-20 DIAGNOSIS — N281 Cyst of kidney, acquired: Secondary | ICD-10-CM | POA: Diagnosis present

## 2019-02-20 DIAGNOSIS — Z9889 Other specified postprocedural states: Secondary | ICD-10-CM

## 2019-02-20 DIAGNOSIS — I129 Hypertensive chronic kidney disease with stage 1 through stage 4 chronic kidney disease, or unspecified chronic kidney disease: Secondary | ICD-10-CM | POA: Diagnosis present

## 2019-02-20 DIAGNOSIS — Z90721 Acquired absence of ovaries, unilateral: Secondary | ICD-10-CM

## 2019-02-20 DIAGNOSIS — N179 Acute kidney failure, unspecified: Secondary | ICD-10-CM | POA: Diagnosis present

## 2019-02-20 DIAGNOSIS — Z4659 Encounter for fitting and adjustment of other gastrointestinal appliance and device: Secondary | ICD-10-CM

## 2019-02-20 DIAGNOSIS — Z0189 Encounter for other specified special examinations: Secondary | ICD-10-CM

## 2019-02-20 DIAGNOSIS — Z79899 Other long term (current) drug therapy: Secondary | ICD-10-CM

## 2019-02-20 DIAGNOSIS — Z87440 Personal history of urinary (tract) infections: Secondary | ICD-10-CM

## 2019-02-20 DIAGNOSIS — N390 Urinary tract infection, site not specified: Secondary | ICD-10-CM | POA: Diagnosis not present

## 2019-02-20 DIAGNOSIS — I442 Atrioventricular block, complete: Secondary | ICD-10-CM | POA: Diagnosis present

## 2019-02-20 DIAGNOSIS — Z6820 Body mass index (BMI) 20.0-20.9, adult: Secondary | ICD-10-CM | POA: Diagnosis not present

## 2019-02-20 DIAGNOSIS — Z85048 Personal history of other malignant neoplasm of rectum, rectosigmoid junction, and anus: Secondary | ICD-10-CM

## 2019-02-20 DIAGNOSIS — R070 Pain in throat: Secondary | ICD-10-CM | POA: Diagnosis not present

## 2019-02-20 DIAGNOSIS — K219 Gastro-esophageal reflux disease without esophagitis: Secondary | ICD-10-CM | POA: Diagnosis present

## 2019-02-20 DIAGNOSIS — K449 Diaphragmatic hernia without obstruction or gangrene: Secondary | ICD-10-CM | POA: Diagnosis present

## 2019-02-20 DIAGNOSIS — Z8701 Personal history of pneumonia (recurrent): Secondary | ICD-10-CM

## 2019-02-20 DIAGNOSIS — E785 Hyperlipidemia, unspecified: Secondary | ICD-10-CM | POA: Diagnosis present

## 2019-02-20 DIAGNOSIS — J9601 Acute respiratory failure with hypoxia: Secondary | ICD-10-CM

## 2019-02-20 DIAGNOSIS — Z7901 Long term (current) use of anticoagulants: Secondary | ICD-10-CM

## 2019-02-20 LAB — CBC
HCT: 28.2 % — ABNORMAL LOW (ref 36.0–46.0)
HCT: 29.2 % — ABNORMAL LOW (ref 36.0–46.0)
Hemoglobin: 9.2 g/dL — ABNORMAL LOW (ref 12.0–15.0)
Hemoglobin: 9.5 g/dL — ABNORMAL LOW (ref 12.0–15.0)
MCH: 33.8 pg (ref 26.0–34.0)
MCH: 34.2 pg — ABNORMAL HIGH (ref 26.0–34.0)
MCHC: 32.6 g/dL (ref 30.0–36.0)
MCHC: 32.5 g/dL (ref 30.0–36.0)
MCV: 103.7 fL — ABNORMAL HIGH (ref 80.0–100.0)
MCV: 105 fL — ABNORMAL HIGH (ref 80.0–100.0)
Platelets: 301 10*3/uL (ref 150–400)
Platelets: 308 10*3/uL (ref 150–400)
RBC: 2.72 MIL/uL — ABNORMAL LOW (ref 3.87–5.11)
RBC: 2.78 MIL/uL — ABNORMAL LOW (ref 3.87–5.11)
RDW: 13.6 % (ref 11.5–15.5)
RDW: 13.6 % (ref 11.5–15.5)
WBC: 7 10*3/uL (ref 4.0–10.5)
WBC: 5.5 10*3/uL (ref 4.0–10.5)
nRBC: 0.4 % — ABNORMAL HIGH (ref 0.0–0.2)
nRBC: 1.8 % — ABNORMAL HIGH (ref 0.0–0.2)

## 2019-02-20 LAB — CBC WITH DIFFERENTIAL/PLATELET
Abs Immature Granulocytes: 0.02 10*3/uL (ref 0.00–0.07)
Basophils Absolute: 0 10*3/uL (ref 0.0–0.1)
Basophils Relative: 0 %
Eosinophils Absolute: 0 10*3/uL (ref 0.0–0.5)
Eosinophils Relative: 0 %
HCT: 28 % — ABNORMAL LOW (ref 36.0–46.0)
Hemoglobin: 9.4 g/dL — ABNORMAL LOW (ref 12.0–15.0)
Immature Granulocytes: 0 %
Lymphocytes Relative: 8 %
Lymphs Abs: 0.6 10*3/uL — ABNORMAL LOW (ref 0.7–4.0)
MCH: 34.4 pg — ABNORMAL HIGH (ref 26.0–34.0)
MCHC: 33.6 g/dL (ref 30.0–36.0)
MCV: 102.6 fL — ABNORMAL HIGH (ref 80.0–100.0)
Monocytes Absolute: 0.9 10*3/uL (ref 0.1–1.0)
Monocytes Relative: 12 %
Neutro Abs: 5.8 10*3/uL (ref 1.7–7.7)
Neutrophils Relative %: 80 %
Platelets: 262 10*3/uL (ref 150–400)
RBC: 2.73 MIL/uL — ABNORMAL LOW (ref 3.87–5.11)
RDW: 13.6 % (ref 11.5–15.5)
WBC: 7.3 10*3/uL (ref 4.0–10.5)
nRBC: 0.4 % — ABNORMAL HIGH (ref 0.0–0.2)

## 2019-02-20 LAB — OCCULT BLOOD GASTRIC / DUODENUM (SPECIMEN CUP): Occult Blood, Gastric: POSITIVE — AB

## 2019-02-20 LAB — COMPREHENSIVE METABOLIC PANEL
ALT: 7 U/L (ref 0–44)
AST: 25 U/L (ref 15–41)
Albumin: 3.1 g/dL — ABNORMAL LOW (ref 3.5–5.0)
Alkaline Phosphatase: 48 U/L (ref 38–126)
Anion gap: 18 — ABNORMAL HIGH (ref 5–15)
BUN: 83 mg/dL — ABNORMAL HIGH (ref 8–23)
CO2: 29 mmol/L (ref 22–32)
Calcium: 8.7 mg/dL — ABNORMAL LOW (ref 8.9–10.3)
Chloride: 90 mmol/L — ABNORMAL LOW (ref 98–111)
Creatinine, Ser: 2.31 mg/dL — ABNORMAL HIGH (ref 0.44–1.00)
GFR calc Af Amer: 20 mL/min — ABNORMAL LOW (ref >60)
GFR calc non Af Amer: 18 mL/min — ABNORMAL LOW (ref >60)
Glucose, Bld: 194 mg/dL — ABNORMAL HIGH (ref 70–99)
Potassium: 5.3 mmol/L — ABNORMAL HIGH (ref 3.5–5.1)
Sodium: 137 mmol/L (ref 135–145)
Total Bilirubin: 1.5 mg/dL — ABNORMAL HIGH (ref 0.3–1.2)
Total Protein: 7.1 g/dL (ref 6.5–8.1)

## 2019-02-20 LAB — SARS CORONAVIRUS 2 (TAT 6-24 HRS): SARS Coronavirus 2: NEGATIVE

## 2019-02-20 LAB — HEMOGLOBIN A1C
Hgb A1c MFr Bld: 5.5 % (ref 4.8–5.6)
Mean Plasma Glucose: 111.15 mg/dL

## 2019-02-20 LAB — GLUCOSE, CAPILLARY
Glucose-Capillary: 215 mg/dL — ABNORMAL HIGH (ref 70–99)
Glucose-Capillary: 166 mg/dL — ABNORMAL HIGH (ref 70–99)
Glucose-Capillary: 186 mg/dL — ABNORMAL HIGH (ref 70–99)
Glucose-Capillary: 130 mg/dL — ABNORMAL HIGH (ref 70–99)
Glucose-Capillary: 115 mg/dL — ABNORMAL HIGH (ref 70–99)

## 2019-02-20 MED ORDER — PANTOPRAZOLE SODIUM 40 MG IV SOLR
40.0000 mg | Freq: Two times a day (BID) | INTRAVENOUS | Status: DC
  Administered 2019-02-20 – 2019-03-02 (×21): 40 mg via INTRAVENOUS
  Filled 2019-02-20 (×21): qty 40

## 2019-02-20 MED ORDER — HYDRALAZINE HCL 20 MG/ML IJ SOLN: 10.0000 mg | INTRAMUSCULAR | Status: DC | PRN

## 2019-02-20 MED ORDER — ONDANSETRON HCL 4 MG PO TABS: 4.0000 mg | ORAL_TABLET | Freq: Four times a day (QID) | ORAL | Status: DC | PRN

## 2019-02-20 MED ORDER — SODIUM CHLORIDE 0.9 % IV SOLN
INTRAVENOUS | Status: AC
  Administered 2019-02-20 (×2): via INTRAVENOUS

## 2019-02-20 MED ORDER — ONDANSETRON HCL 4 MG/2ML IJ SOLN
4.0000 mg | Freq: Four times a day (QID) | INTRAMUSCULAR | Status: DC | PRN
  Administered 2019-02-25 – 2019-03-02 (×3): 4 mg via INTRAVENOUS
  Filled 2019-02-20 (×3): qty 2

## 2019-02-20 MED ORDER — SODIUM CHLORIDE 0.9 % IV SOLN
INTRAVENOUS | Status: DC
  Administered 2019-02-20: 02:00:00 via INTRAVENOUS

## 2019-02-20 MED ORDER — ACETAMINOPHEN 325 MG PO TABS: 650.0000 mg | ORAL_TABLET | Freq: Four times a day (QID) | ORAL | Status: DC | PRN

## 2019-02-20 MED ORDER — CHLORHEXIDINE GLUCONATE CLOTH 2 % EX PADS
6.0000 | MEDICATED_PAD | Freq: Every day | CUTANEOUS | Status: DC
  Administered 2019-02-21 – 2019-03-02 (×10): 6 via TOPICAL

## 2019-02-20 MED ORDER — ALBUTEROL SULFATE (2.5 MG/3ML) 0.083% IN NEBU
3.0000 mL | INHALATION_SOLUTION | RESPIRATORY_TRACT | Status: DC | PRN
  Filled 2019-02-20: qty 3

## 2019-02-20 MED ORDER — ACETAMINOPHEN 650 MG RE SUPP
650.0000 mg | Freq: Four times a day (QID) | RECTAL | Status: DC | PRN
  Administered 2019-02-21: 650 mg via RECTAL
  Filled 2019-02-20: qty 1

## 2019-02-20 NOTE — Progress Notes (Signed)
PROGRESS NOTE    Patient: Hannah Mercado                            PCP: Patient, No Pcp Per                    DOB: 09-24-1922            DOA: 02/21/2019 YKZ:[id number]             DOS: 03/04/2019, 11:03 AM   LOS: 0 days   Date of Service: The patient was seen and examined on 03/06/2019  Subjective:   The patient was seen and examined this morning, remained stable awake in bed in mild discomfort with mild abdominal distention. Complain of some nausea, early this morning had some vomiting. Nursing staff still attempting to place NG tube.  Brief Narrative:   Loucinda Croy  is a pleasant 85 year old female with past medical history of HTN, CAD, pacemaker, pulmonary fibrosis/pulmonary, CKD, A-fib on eliquis (last dose 02/18/19), and recent right intertrochanteric femur fracture s/p IMN 02/17/19 by Dr. Lyla Glassing, who was discharged from the hospital yesterday to inpatient rehab.  Presented with pain, distention, nausea vomiting.  . Patient reported that over the last 2 days she has had worsening abdominal pain and decreased appetite. She feels bloated,with nausea over night and had multiple bouts of emesis.  Abdominal xray followed by CT scan were obtained. This reports significantly dilated stomach, dilatation of the duodenum and jejunum with transition point in the right hemipelvis; suspect SBO related to adhesions.   Patient seems to have a history of prior abdominal surgeries including microscopic oophorectomy, anterior resection on 02/08/2011 by Dr. Barry Dienes for rectal cancer, also history of appendectomy, ectopic pregnancy, vaginal hysterectomy.   03/08/2019 - PTA -600 mL of coffee-ground vomitus -remained stable continue to complain of abdominal pain distention, nausea and vomiting. - general surgery following Remains n.p.o. NG tube was placed-discussed daughter in detail    Assessment & Plan:   Principal Problem:   Small bowel obstruction ([place]) Active Problems:  Hypertension   Persistent atrial fibrillation (HCC)   Pulmonary hypertension (HCC)   Protein-calorie malnutrition, severe   Vomiting   ARF (acute renal failure) (HCC)   SBO (small bowel obstruction) (HCC)   Small bowel obstruction -CT abdomen pelvis -reviewed confirming SBO on the right -Remain n.p.o. -Continue gentle IV fluid hydration -Attempting per nursing staff to place NG tube   History of prior abdominal surgeries including microscopic oophorectomy, anterior resection on 02/08/2011 by Dr. Barry Dienes for rectal cancer, also history of appendectomy, ectopic pregnancy, vaginal hysterectomy.  -General surgery consulted, appreciate follow-up and recommendations.  Acute renal insufficiency/hypokalemia -Current BUN/creatinine 83/2.31 (baseline around 1.08) -We will continue gentle IV fluid hydration, -Kayexalate, calcium gluconate if needed -Monitoring closely -Avoiding nephrotoxins  Hematemesis -Gastroccult positive -Vomiting was reported by nursing staff but no hematemesis  -Continue IV Protonix -Monitoring H&H, hemoglobin stable at 9.2  -Home medication of apixaban on hold, -Anticipating if patient remained stable next few days to restart heparin or apixaban  H/o  Atrial fibrillation  -with complete heart block, with history of pacemaker in place -On digoxin, currently on hold apixaban on hold -ruling out overt GI bleed at this time  History of coronary artery disease -May stable denies any chest pain  Hypertension -Currently n.p.o. -Blood pressure remained stable, monitoring -On IV hydralazine  Acute on chronic anemia with possible blood loss anemia -Controlled H&H, currently hemoglobin  stable around 9.2  Status post recent hip surgery -Remained stable  History of rectal cancer  -status post resection follow CT scan.  DVT prophylaxis: SCDs. Code Status: Full code. Family Communication: Discussed current patient situation with her daughter Ms. Brooks Sailors  Disposition Plan: To be determined. Consults called: General surgery. Admission status: Inpatient  Please update patient's daughter Ms. Brooks Sailors at [phone]   CT abdomen pelvis  IMPRESSION: Changes consistent with small-bowel obstruction in the right deep pelvis likely related to adhesions as no mass lesion is seen. This appears to lie in the proximal aspect of the ileum. Follow-up films are recommended. Sliding-type hiatal hernia. Recent postoperative changes in the proximal right hip. Stable renal cysts and bibasilar lung scarring.  Electronically Signed   By: Inez Catalina M.D.   On: 03/05/2019 08:39     Procedures:   No admission procedures for hospital encounter.   NGT  Antimicrobials:  Anti-infectives (From admission, onward)   None       Medication:  . pantoprazole (PROTONIX) IV  40 mg Intravenous Q12H    acetaminophen **OR** acetaminophen, albuterol, hydrALAZINE, ondansetron **OR** ondansetron (ZOFRAN) IV   Objective:   Vitals:   03/12/2019 0419 03/16/2019 0500 02/28/2019 0700 03/06/2019 0800  BP: (!) 128/56  (!) 113/54   Pulse: 63  63 63  Resp:   (!) 22 19  Temp: 98.3 F (36.8 C)  97.7 F (36.5 C)   TempSrc: Oral  Oral   SpO2: 98%  95% (!) 88%  Weight:  61.3 kg      Intake/Output Summary (Last 24 hours) at 03/20/2019 1103 Last data filed at 03/01/2019 1040 Gross per 24 hour  Intake 5.88 ml  Output 850 ml  Net -844.12 ml   Filed Weights   03/06/2019 0500  Weight: 61.3 kg     Examination:   Physical Exam  Constitution:  Alert, cooperative, no distress,  Appears calm and comfortable  Psychiatric: Normal and stable mood and affect, cognition intact,   HEENT: Normocephalic, PERRL, otherwise with in Normal limits  Chest:Chest symmetric Cardio vascular:  S1/S2, RRR, No murmure, No Rubs or Gallops  pulmonary: Clear to auscultation bilaterally, respirations unlabored, negative wheezes / crackles Abdomen: Diffusely tender mildly  distended but soft abdomen hypoactive bowel sounds,, o masses, no organomegaly Muscular skeletal: Limited exam - in bed, able to move all 4 extremities, Normal strength,  Neuro: CNII-XII intact. , normal motor and sensation, reflexes intact  Extremities: No pitting edema lower extremities, +2 pulses  Skin: Dry, warm to touch, negative for any Rashes, hip surgical wounds dressing in place Wounds: Hip surgical wounds, please see nursing documentation  LABs:  CBC Latest Ref Rng & Units 03/09/2019 02/27/2019 02/19/2019  WBC 4.0 - 10.5 K/uL 7.0 7.3 10.2  Hemoglobin 12.0 - 15.0 g/dL 9.2(L) 9.4(L) 9.7(L)  Hematocrit 36.0 - 46.0 % 28.2(L) 28.0(L) 29.3(L)  Platelets 150 - 400 K/uL 301 262 266   CMP Latest Ref Rng & Units 03/03/2019 02/19/2019 02/19/2019  Glucose 70 - 99 mg/dL 194(H) - 174(H)  BUN 8 - 23 mg/dL 83(H) - 53(H)  Creatinine 0.44 - 1.00 mg/dL 2.31(H) - 1.21(H)  Sodium 135 - 145 mmol/L 137 - 137  Potassium 3.5 - 5.1 mmol/L 5.3(H) 6.0(H) 5.7(H)  Chloride 98 - 111 mmol/L 90(L) - 93(L)  CO2 22 - 32 mmol/L 29 - 31  Calcium 8.9 - 10.3 mg/dL 8.7(L) - 9.0  Total Protein 6.5 - 8.1 g/dL 7.1 - -  Total Bilirubin 0.3 - 1.2  mg/dL 1.5(H) - -  Alkaline Phos 38 - 126 U/L 48 - -  AST 15 - 41 U/L 25 - -  ALT 0 - 44 U/L 7 - -        SIGNED: Deatra James, MD, FACP, FHM. [facility],  Pager [phone]  If 7PM-7AM, please contact night-coverage [email or web address], Password [facility] 03/01/2019, 11:03 AM

## 2019-02-20 NOTE — Progress Notes (Signed)
Per previous report, at 1820 patient threw up 600cc's, per computer emesis was brown. PRN zofran given after previous N&V. Abd. Distended, (+) bowel sounds to left, not much movement auscultated on right. On initial assessment patient reports decreased nausea. C/O pain to right hip, PRN robaxin given at 2133.  At [place], patient threw up 600cc's of brown emesis. Unsure of last void, bladder scan=588cc's, I & O cath=600cc's. Paged Zella Ball, NP, on call. Orders received. gastroccult sent and positive. Portable KUB, radiologist called with positive results for bowel obstruction. Paged [place] with results. Still awaiting CBC w/diff and CMET results. K+ results at 0958 6.0, no BM charted after Kayexalate given at 1232 on yesterday. Patient lethargic, but easily aroused. Patrici Ranks A

## 2019-02-20 NOTE — Discharge Summary (Signed)
Physician Discharge Summary  Patient ID: Hannah Mercado MRN: [id number] DOB/AGE: 1922/05/28 85 y.o.  Admit date: 02/19/2019 Discharge date: 03/08/2019  Discharge Diagnoses:  Principal Problem:   Hip fracture [facility]) DVT prophylaxis Pain management Acute blood loss anemia Atrial fibrillation with pacemaker Hypertension Pulmonary fibrosis Hyperlipidemia CKD stage III Small bowel obstruction  Discharged Condition: Guarded  Significant Diagnostic Studies: Pelvis Portable  Result Date: 02/17/2019 CLINICAL DATA:  Postop day 0 ORIF comminuted intertrochanteric RIGHT femoral neck fracture EXAM: PORTABLE PELVIS 1-2 VIEWS COMPARISON:  02/15/2019. FINDINGS: ORIF of the comminuted intertrochanteric RIGHT femoral neck fracture with an intramedullary nail and compression screw. Marked improvement in alignment post ORIF. No acute complicating features. Mild osseous demineralization. Contralateral LEFT hip joint with relatively well-preserved joint space. IMPRESSION: 1. Marked improvement in alignment post ORIF of the comminuted intertrochanteric RIGHT femoral neck fracture. No acute complicating features. 2. Mild osseous demineralization. Electronically Signed   By: Evangeline Dakin M.D.   On: 02/17/2019 12:08   Dg Chest Port 1 View  Result Date: 02/15/2019 CLINICAL DATA:  Fall, hip fracture EXAM: PORTABLE CHEST 1 VIEW COMPARISON:  03/18/2018 FINDINGS: Increased interstitial markings. No focal consolidation. Left lower lobe scarring. No pleural effusion or pneumothorax. Cardiomegaly.  Left subclavian pacemaker. IMPRESSION: No evidence of acute cardiopulmonary disease. Electronically Signed   By: Julian Hy M.D.   On: 02/15/2019 10:29   Dg Knee Right Port  Result Date: 02/15/2019 CLINICAL DATA:  Right leg pain secondary to a fall today. Right proximal femur fracture. EXAM: PORTABLE RIGHT KNEE - 1-2 VIEW COMPARISON:  None. FINDINGS: There is no fracture or dislocation. Slight arthritic  changes in the medial and patellofemoral compartments. Extensive arterial calcification. IMPRESSION: No acute abnormality. Slight arthritic changes in the medial and patellofemoral compartments. Electronically Signed   By: Lorriane Shire M.D.   On: 02/15/2019 11:43   Dg Abd Portable 1v  Result Date: 03/08/2019 CLINICAL DATA:  85 year old female is vomiting blood. EXAM: PORTABLE ABDOMEN - 1 VIEW COMPARISON:  CT Abdomen and Pelvis 03/07/2017. lumbar radiographs 03/18/2018. FINDINGS: Portable AP supine view at 0137 hours. Dilated gas-filled small bowel in the lower abdomen and pelvis measures up to 51 millimeters diameter. Paucity of bowel gas in the upper abdomen, no transverse colon or stomach air identified. No pneumoperitoneum is evident on this supine view. There does appear to be some gas in the ascending and descending colon. Lung bases are not included. Chronic cardiac pacemaker lead. Multilevel lumbar compression fractures again noted. Interval proximal right femur ORIF. IMPRESSION: 1. Positive for Small-bowel Obstruction. 2. Proximal right femur ORIF.  Chronic lumbar compression fractures. These results will be called to the ordering clinician or representative by the Radiologist Assistant, and communication documented in the PACS or zVision Dashboard. Electronically Signed   By: Genevie Ann M.D.   On: 03/08/2019 01:49   Dg C-arm 1-60 Min  Result Date: 02/17/2019 CLINICAL DATA:  IM nail RIGHT femur EXAM: DG C-ARM 1-60 MIN; RIGHT FEMUR 2 VIEWS COMPARISON:  None. FINDINGS: Four intraoperative fluoroscopic images are provided demonstrating IM nail placement within the RIGHT femur. Hardware appears intact and appropriately positioned. Osseous alignment is anatomic. Fluoroscopy provided for 1 minutes 58 seconds. IMPRESSION: Intraoperative fluoroscopic images demonstrating intramedullary nail placement within the RIGHT femur. No evidence of surgical complicating feature. Electronically Signed   By: Franki Cabot M.D.   On: 02/17/2019 09:55   Dg Hip Port Unilat W Or Wo Pelvis 1 View Right  Result Date: 02/15/2019 CLINICAL DATA:  Fall, right  hip deformity EXAM: DG HIP (WITH OR WITHOUT PELVIS) 1V PORT RIGHT COMPARISON:  None. FINDINGS: Comminuted intertrochanteric right hip fracture. Varus angulation and foreshortening. Displaced lesser trochanteric fragment. Left hip is intact.  Bilateral hip joint spaces are preserved. Visualized bony pelvis appears intact. IMPRESSION: Comminuted intertrochanteric right hip fracture, as above. Electronically Signed   By: Julian Hy M.D.   On: 02/15/2019 10:28   Dg Femur, Min 2 Views Right  Result Date: 02/17/2019 CLINICAL DATA:  IM nail RIGHT femur EXAM: DG C-ARM 1-60 MIN; RIGHT FEMUR 2 VIEWS COMPARISON:  None. FINDINGS: Four intraoperative fluoroscopic images are provided demonstrating IM nail placement within the RIGHT femur. Hardware appears intact and appropriately positioned. Osseous alignment is anatomic. Fluoroscopy provided for 1 minutes 58 seconds. IMPRESSION: Intraoperative fluoroscopic images demonstrating intramedullary nail placement within the RIGHT femur. No evidence of surgical complicating feature. Electronically Signed   By: Franki Cabot M.D.   On: 02/17/2019 09:55   Dg Femur Port, Min 2 Views Right  Result Date: 02/17/2019 CLINICAL DATA:  Postop day 0 of ORIF of a comminuted intertrochanteric RIGHT femoral neck fracture. EXAM: RIGHT FEMUR PORTABLE 2 VIEW 10:35 a.m.: COMPARISON:  Intraoperative RIGHT femur x-rays earlier same day at 9:25 a.m. RIGHT hip x-rays 02/15/2019. FINDINGS: Marked improvement in alignment post ORIF of the comminuted intertrochanteric RIGHT femoral neck fracture with intramedullary nail and compression screw. No acute complicating features. IMPRESSION: Marked improvement in alignment post ORIF of the comminuted intertrochanteric RIGHT femoral neck fracture without acute complicating features. Electronically Signed   By:  Evangeline Dakin M.D.   On: 02/17/2019 12:09    Labs:  Basic Metabolic Panel: Recent Labs  Lab 02/15/19 0945 02/16/19 0407 02/17/19 1144 02/18/19 0204 02/19/19 0458 02/19/19 0958 02/28/2019 0154  NA 139 140  --  135 137  --  137  K 4.5 4.4  --  5.1 5.7* 6.0* 5.3*  CL 105 104  --  97* 93*  --  90*  CO2 25 26  --  27 31  --  29  GLUCOSE 156* 107*  --  167* 174*  --  194*  BUN 15 20  --  36* 53*  --  83*  CREATININE 1.05* 1.08* 0.97 1.05* 1.21*  --  2.31*  CALCIUM 8.6* 8.3*  --  8.6* 9.0  --  8.7*    CBC: Recent Labs  Lab 02/15/19 0945  02/17/19 0313  02/18/19 1026 02/19/19 0458 03/09/2019 0154  WBC 15.6*   < > 9.2   < > 12.9* 10.2 7.3  NEUTROABS 13.6*  --  6.9  --   --   --  5.8  HGB 11.9*   < > 9.6*   < > 9.2* 9.7* 9.4*  HCT 37.9   < > 28.8*   < > 27.8* 29.3* 28.0*  MCV 108.3*   < > 103.2*   < > 103.0* 102.8* 102.6*  PLT 226   < > 185   < > 232 266 262   < > = values in this interval not displayed.    CBG: Recent Labs  Lab 03/04/2019 0119 03/06/2019 0348  GLUCAP 215* 166*   Family history.  Father with CAD.  Brother with lung cancer.  Negative for colon cancer or diabetes mellitus  Brief HPI:   Hannah Mercado is a 85 y.o. right-handed female with history of rectal cancer, atrial fibrillation with pacemaker maintained on Eliquis, hypertension, hyperlipidemia, chronic renal insufficiency, pulmonary fibrosis.  Patient lives alone independent with assistive  device.  Daughter and son-in-law assist as needed.  Presented 02/15/2019 after mechanical fall without loss of consciousness.  X-rays and imaging revealed right intertrochanteric femur fracture.  Patient was cleared for surgery underwent IM fixation right femur 02/17/2019 per Dr. Lyla Glassing.  Weightbearing as tolerated.  Hospital course pain management.  Chronic Eliquis resumed.  Acute blood loss anemia 9.7 and monitored.  Noted hyperkalemia 5.7-6.0 patient did receive Kayexalate and monitoring as well as creatinine at  1.21.  Therapy evaluations completed and patient was admitted for a comprehensive rehab program   Hospital Course: Hannah Mercado was admitted to rehab 02/19/2019 for inpatient therapies to consist of PT, ST and OT at least three hours five days a week. Past admission physiatrist, therapy team and rehab RN have worked together to provide customized collaborative inpatient rehab.  Patient was admitted to inpatient rehab services 02/19/2019 evaluations yet to be completed.  Noted early morning hours of 03/06/2019 patient with episode of vomiting 600 cc receive Zofran noted nausea as well as INO cath of 600 cc after bladder scan of 588 cc.  Order obtained for Gastroccult sent that was positive.  Portable KUB positive results for bowel obstruction.  Follow-up chemistries showed a potassium 5.3 creatinine 2.31 BUN 83, hemoglobin 9.4, WBC 7.3.  Patient was transferred to acute care services 02/21/2019 for ongoing care and evaluation.   Blood pressures were monitored on TID basis and    /She is continent of bowel and bladder.  Rehab course: During patient's stay in rehab weekly team conferences were held to monitor patient's progress, set goals and discuss barriers to discharge. At admission, patient required moderate assist ambulate 5 feet rolling walker, moderate assist sit to stand, moderate assist sit to supine.  Set up for upper body bathing minimal assist lower body bathing set up upper body dressing moderate assist lower body dressing  Physical exam.  Blood pressure 129/52 pulse 66 temperature 97.9 respirations 16 oxygen saturations 98% room air Constitutional patient was alert followed commands HEENT Head.  Normocephalic and atraumatic Eyes.  Pupils round and reactive to light EOMs normal no nystagmus Neck.  Supple nontender normal range of motion no tracheal deviation Respiratory effort normal no respiratory distress without wheezes no rails GI soft positive bowel sounds no abdominal  tenderness Musculoskeletal Comments.  Right thigh with edema tender to palpation and with range of motion. Neurological no cranial nerve deficits coordination normal upper extremities 5 out of 5 left lower extremity 4- out of 5 proximal to 5 out of 5 distally.  Right lower extremity 1-2 out of 5 hip flexors knee extension and 4- out of 5 distally.  Hip incision dry with glue foam dressing  Hannah Mercado  has had improvement in activity tolerance, balance, postural control as well as ability to compensate for deficits. Hannah Mercado has had improvement in functional use RUE/LUE  and RLE/LLE as well as improvement in awareness.  Therapies were yet to be initiated as patient had developed nausea vomiting findings of small bowel obstruction was discharged to acute care services 03/12/2019       Disposition: Guarded   Diet:NPO  Special Instructions:  Medications at discharge established as per [facility].   Allergies as of 03/13/2019      Reactions   Sulfonamide Derivatives Nausea And Vomiting   Happened a long time ago, does not remember the other reaction she had.      Medication List    ASK your doctor about these medications   albuterol 108 (90 Base)  MCG/ACT inhaler Commonly known as: VENTOLIN HFA Inhale 2 puffs into the lungs every 6 (six) hours as needed. Wheezing   alendronate 70 MG tablet Commonly known as: FOSAMAX Take 70 mg by mouth once a week.   amLODipine 5 MG tablet Commonly known as: NORVASC TAKE 1 TABLET BY MOUTH EVERY DAY   apixaban 2.5 MG Tabs tablet Commonly known as: Eliquis Take 1 tablet (2.5 mg total) by mouth 2 (two) times daily.   ascorbic acid 250 MG Chew Commonly known as: VITAMIN C Chew 250 mg by mouth daily.   atorvastatin 20 MG tablet Commonly known as: LIPITOR Take 0.5 tablets (10 mg total) by mouth daily.   calcium-vitamin D 500-200 MG-UNIT tablet Take 2 tablets by mouth daily.   docusate sodium 100 MG capsule Commonly known as: COLACE Take 1  capsule (100 mg total) by mouth 2 (two) times daily.   furosemide 20 MG tablet Commonly known as: LASIX TAKE 1 TABLET BY MOUTH AS NEEDED FOR SWELLING   gabapentin 300 MG capsule Commonly known as: NEURONTIN Take 1 capsule (300 mg total) by mouth at bedtime.   HYDROcodone-acetaminophen 5-325 MG tablet Commonly known as: NORCO/VICODIN Take 1-2 tablets by mouth every 6 (six) hours as needed for moderate pain.   methocarbamol 500 MG tablet Commonly known as: ROBAXIN Take 1 tablet (500 mg total) by mouth every 6 (six) hours as needed for muscle spasms.   pantoprazole 40 MG tablet Commonly known as: PROTONIX Take 1 tablet (40 mg total) by mouth daily.   vitamin B-12 250 MCG tablet Commonly known as: CYANOCOBALAMIN Take 250 mcg by mouth daily.   Vitamin D3 25 MCG (1000 UT) Caps Take 1 capsule by mouth daily.   vitamin E 200 UNIT capsule Generic drug: vitamin E Take 200 Units by mouth daily.        Signed: Lavon Paganini Mareena Cavan 03/15/2019, 5:41 AM

## 2019-02-20 NOTE — Plan of Care (Signed)
Continue to monitor

## 2019-02-20 NOTE — Progress Notes (Addendum)
Patient has not voided today on this shift. Bladder scan done. > 387 urine in bladder. Patient does not feels like she needs to go. MD notified.   1615: Spoke with Dr. Roger Shelter who says to monitor patient a few more hours then place foley catheter if patient has not voided on her own. If placed, reassess in 12 hours.  Emelda Fear, RN

## 2019-02-20 NOTE — Progress Notes (Addendum)
NG tube placed at this time. Patient vomited large amount of green colored emesis during insertion. Xray ordered to verify placement.   NG tube removed and reinserted per xray. New xray ordered.   1130: NG tube advanced per recommendations. Patient tolerated well. NG tube placed to low intermittent suction per order. Green colored contents returning to canister.

## 2019-02-20 NOTE — H&P (Addendum)
History and Physical    Hannah Mercado KGM:[id number] DOB: [birth year] DOA: 03/18/2019  PCP: Patient, No Pcp Per  Patient coming from: Patient was transferred from rehab.  Chief Complaint: Nausea vomiting.  HPI: Hannah Mercado is a 85 y.o. female with history of A. fib complete heart block status post pacemaker placement on apixaban, CAD, rectal cancer status post LAR by Dr. Barry Dienes had recent surgery for right hip and was transferred to rehab yesterday was found to have 600 mL of coffee-ground vomitus.  Not have any abdominal pain.  Patient had acute abdominal series done which shows small bowel obstruction and we were consulted for further management.  Labs was initially showing potassium of 6 for which patient was given Kayexalate.  Has not had any bowel movements after the Kayexalate.  Repeat labs show creatinine has worsened to 2.3 potassium is improved to 5.3 hemoglobin 9.4 platelets 262.  Patient is admitted for further management of small bowel obstruction with acute renal failure and possible hematemesis.  On exam initially patient was mildly lethargic but was following commands.  ED Course: Patient is a direct admit.  Review of Systems: As per HPI, rest all negative.   Past Medical History:  Diagnosis Date  . Allergy history unknown   . Anxiety   . Asthmatic bronchitis    use inhaler prn   . Atrial fibrillation ([place]) 10/16/2015  . Blood transfusion    ? with ectopic pregnancy 50 yrs ago   . Cardiac pacemaker in situ 02/24/2016  . Carotid artery disease ([place]) 03/12/2015  . Chronic venous insufficiency 09/11/2015  . Diaphragmatic hernia 07/31/2008   Qualifier: Diagnosis of  By: Lenna Gilford MD, Deborra Medina   . Diverticulosis of colon without hemorrhage 08/02/2012  . DJD (degenerative joint disease)   . Edema 09/11/2015  . GERD (gastroesophageal reflux disease)   . Hiatal hernia   . History of pneumonia    right middle lobe  . Hypercholesteremia   . HYPERCHOLESTEROLEMIA  04/19/2007   Qualifier: Diagnosis of  By: Julien Girt CMA, Marliss Czar    . Hypertension 04/14/2011  . Mitral valve disorder 04/20/2007   Qualifier: Diagnosis of  By: Lenna Gilford MD, Deborra Medina   . Osteopenia   . Persistent atrial fibrillation ([place])   . PULMONARY FIBROSIS, POSTINFLAMMATORY 07/31/2008   Qualifier: Diagnosis of  By: Lenna Gilford MD, Deborra Medina   . Rectal cancer ([place])   . Right carotid bruit 08/28/2014    Past Surgical History:  Procedure Laterality Date  . APPENDECTOMY    . COLON RESECTION  02/08/2011   Procedure: LAPAROSCOPIC SIGMOID COLON RESECTION;  Surgeon: Stark Klein, MD;  Location: [facility];  Service: General;  Laterality: N/A;  Laparoscopic Lower Anterior Bowel Resection  . COLON SURGERY    . COLONOSCOPY  11/27/10   rectal cancer, small sigmoid adenoma, diverticulosis  . ECTOPIC PREGNANCY SURGERY     Midline incision  . EP IMPLANTABLE DEVICE N/A 10/16/2015   Procedure: Pacemaker Implant;  Surgeon: Will Meredith Leeds, MD;  [facility];  Service: Cardiovascular;  Laterality: N/A;  . FEMUR IM NAIL Right 02/17/2019   Procedure: INTRAMEDULLARY (IM) NAIL FEMORAL;  Surgeon: Rod Can, MD;  Location: [facility];  Service: Orthopedics;  Laterality: Right;  . HAMMER TOE SURGERY  1995   Dr. Silverio Decamp  . Laparoscopic Left oophorectomy, laparoscopic low anterior resection, placement of OnQ pain pump  02/08/2011  . NASAL SINUS SURGERY  1994  . OVARIAN CYST REMOVAL  02/08/2011   Procedure: OVARIAN  CYSTECTOMY;  Surgeon: Stark Klein, MD;  Location: [facility];  Service: General;  Laterality: Left;  Removal of Left Ovary and Pelvic Mass  . VAGINAL HYSTERECTOMY  1980's     reports that she has never smoked. She has never used smokeless tobacco. She reports current alcohol use. She reports that she does not use drugs.  Allergies  Allergen Reactions  . Sulfonamide Derivatives Nausea And Vomiting    Happened a long time ago, does not remember the other reaction she had.    Family History   Problem Relation Age of Onset  . Heart disease Father   . Lung cancer Brother        x 2  . Colon cancer Neg Hx     Prior to Admission medications   Medication Sig Start Date End Date Taking? Authorizing Provider  albuterol (PROVENTIL HFA;VENTOLIN HFA) 108 (90 BASE) MCG/ACT inhaler Inhale 2 puffs into the lungs every 6 (six) hours as needed. Wheezing  10/09/10   Noralee Space, MD  alendronate (FOSAMAX) 70 MG tablet Take 70 mg by mouth once a week. 11/26/18   [provider]  amLODipine (NORVASC) 5 MG tablet TAKE 1 TABLET BY MOUTH EVERY DAY Patient taking differently: Take 5 mg by mouth daily.  08/31/18   Noralee Space, MD  apixaban (ELIQUIS) 2.5 MG TABS tablet Take 1 tablet (2.5 mg total) by mouth 2 (two) times daily. 10/11/18   Camnitz, Ocie Doyne, MD  ascorbic acid (VITAMIN C) 250 MG CHEW Chew 250 mg by mouth daily.     [provider]  atorvastatin (LIPITOR) 20 MG tablet Take 0.5 tablets (10 mg total) by mouth daily. 09/08/17   Noralee Space, MD  Calcium Carbonate-Vitamin D (CALCIUM-VITAMIN D) 500-200 MG-UNIT per tablet Take 2 tablets by mouth daily.     [provider]  Cholecalciferol (VITAMIN D3) 1000 UNITS CAPS Take 1 capsule by mouth daily.     [provider]  docusate sodium (COLACE) 100 MG capsule Take 1 capsule (100 mg total) by mouth 2 (two) times daily. 02/19/19   Shawna Clamp, MD  furosemide (LASIX) 20 MG tablet TAKE 1 TABLET BY MOUTH AS NEEDED FOR SWELLING Patient taking differently: Take 20 mg by mouth daily as needed for fluid (swelling).  09/13/18   Noralee Space, MD  gabapentin (NEURONTIN) 300 MG capsule Take 1 capsule (300 mg total) by mouth at bedtime. 02/19/19   Shawna Clamp, MD  HYDROcodone-acetaminophen (NORCO/VICODIN) 5-325 MG tablet Take 1-2 tablets by mouth every 6 (six) hours as needed for moderate pain. 02/19/19   Shawna Clamp, MD  methocarbamol (ROBAXIN) 500 MG tablet Take 1 tablet (500 mg total) by mouth every 6 (six)  hours as needed for muscle spasms. 02/19/19   Shawna Clamp, MD  pantoprazole (PROTONIX) 40 MG tablet Take 1 tablet (40 mg total) by mouth daily. 03/03/2019   Shawna Clamp, MD  vitamin B-12 (CYANOCOBALAMIN) 250 MCG tablet Take 250 mcg by mouth daily.     [provider]  vitamin E (VITAMIN E) 200 UNIT capsule Take 200 Units by mouth daily.     [provider]    Physical Exam: Constitutional: Moderately built and nourished. Vitals:   03/09/2019 0419  BP: (!) 128/56  Pulse: 63  Temp: 98.3 F (36.8 C)  TempSrc: Oral  SpO2: 98%   Eyes: Anicteric no pallor. ENMT: No discharge from the ears eyes nose or mouth. Neck: No mass felt.  No neck rigidity. Respiratory: No rhonchi  or crepitations. Cardiovascular: S1-S2 heard. Abdomen: Slightly distended bowel sounds appreciated no guarding or rigidity. Musculoskeletal: No edema. Skin: No rash. Neurologic: Mildly lethargic but answers questions appropriately moves all extremities. Psychiatric: Mildly lethargic.   Labs on Admission: I have personally reviewed following labs and imaging studies  CBC: Recent Labs  Lab 02/15/19 0945  02/17/19 0313 02/17/19 1144 02/18/19 0204 02/18/19 1026 02/19/19 0458 03/13/2019 0154  WBC 15.6*   < > 9.2 14.2* 11.5* 12.9* 10.2 7.3  NEUTROABS 13.6*  --  6.9  --   --   --   --  5.8  HGB 11.9*   < > 9.6* 9.8* 8.7* 9.2* 9.7* 9.4*  HCT 37.9   < > 28.8* 30.3* 26.7* 27.8* 29.3* 28.0*  MCV 108.3*   < > 103.2* 105.6* 103.9* 103.0* 102.8* 102.6*  PLT 226   < > 185 191 181 232 266 262   < > = values in this interval not displayed.   Basic Metabolic Panel: Recent Labs  Lab 02/15/19 0945 02/16/19 0407 02/17/19 1144 02/18/19 0204 02/19/19 0458 02/19/19 0958 03/11/2019 0154  NA 139 140  --  135 137  --  137  K 4.5 4.4  --  5.1 5.7* 6.0* 5.3*  CL 105 104  --  97* 93*  --  90*  CO2 25 26  --  27 31  --  29  GLUCOSE 156* 107*  --  167* 174*  --  194*  BUN 15 20  --  36* 53*  --  83*   CREATININE 1.05* 1.08* 0.97 1.05* 1.21*  --  2.31*  CALCIUM 8.6* 8.3*  --  8.6* 9.0  --  8.7*   GFR: Estimated Creatinine Clearance: 13.7 mL/min (A) (by C-G formula based on SCr of 2.31 mg/dL (H)). Liver Function Tests: Recent Labs  Lab 03/21/2019 0154  AST 25  ALT 7  ALKPHOS 48  BILITOT 1.5*  PROT 7.1  ALBUMIN 3.1*   No results for input(s): LIPASE, AMYLASE in the last 168 hours. No results for input(s): AMMONIA in the last 168 hours. Coagulation Profile: Recent Labs  Lab 02/15/19 0945  INR 1.1   Cardiac Enzymes: No results for input(s): CKTOTAL, CKMB, CKMBINDEX, TROPONINI in the last 168 hours. BNP (last 3 results) No results for input(s): PROBNP in the last 8760 hours. HbA1C: No results for input(s): HGBA1C in the last 72 hours. CBG: Recent Labs  Lab 03/10/2019 0119 03/20/2019 0348  GLUCAP 215* 166*   Lipid Profile: No results for input(s): CHOL, HDL, LDLCALC, TRIG, CHOLHDL, LDLDIRECT in the last 72 hours. Thyroid Function Tests: No results for input(s): TSH, T4TOTAL, FREET4, T3FREE, THYROIDAB in the last 72 hours. Anemia Panel: No results for input(s): VITAMINB12, FOLATE, FERRITIN, TIBC, IRON, RETICCTPCT in the last 72 hours. Urine analysis:    Component Value Date/Time   COLORURINE YELLOW 08/24/2016 1020   APPEARANCEUR CLEAR 08/24/2016 1020   LABSPEC 1.025 08/24/2016 1020   PHURINE 5.5 08/24/2016 1020   GLUCOSEU NEGATIVE 08/24/2016 1020   HGBUR NEGATIVE 08/24/2016 1020   BILIRUBINUR NEGATIVE 08/24/2016 1020   KETONESUR NEGATIVE 08/24/2016 1020   PROTEINUR NEGATIVE 02/01/2011 1545   UROBILINOGEN 0.2 08/24/2016 1020   NITRITE POSITIVE (A) 08/24/2016 1020   LEUKOCYTESUR MODERATE (A) 08/24/2016 1020   Sepsis Labs: @LABRCNTIP (procalcitonin:4,lacticidven:4) ) Recent Results (from the past 240 hour(s))  Surgical pcr screen     Status: None   Collection Time: 02/15/19  3:24 PM   Specimen: Nasal Mucosa; Nasal Swab  Result Value  Ref Range Status   MRSA, PCR  NEGATIVE NEGATIVE Final   Staphylococcus aureus NEGATIVE NEGATIVE Final    Comment: (NOTE) The Xpert SA Assay (FDA approved for NASAL specimens in patients 38 years of age and older), is one component of a comprehensive surveillance program. It is not intended to diagnose infection nor to guide or monitor treatment. Performed at [facility], [place] [address], [place], [place] [address]      Radiological Exams on Admission: Dg Abd Portable 1v  Result Date: 03/03/2019 CLINICAL DATA:  84 year old female is vomiting blood. EXAM: PORTABLE ABDOMEN - 1 VIEW COMPARISON:  CT Abdomen and Pelvis 03/07/2017. lumbar radiographs 03/18/2018. FINDINGS: Portable AP supine view at 0137 hours. Dilated gas-filled small bowel in the lower abdomen and pelvis measures up to 51 millimeters diameter. Paucity of bowel gas in the upper abdomen, no transverse colon or stomach air identified. No pneumoperitoneum is evident on this supine view. There does appear to be some gas in the ascending and descending colon. Lung bases are not included. Chronic cardiac pacemaker lead. Multilevel lumbar compression fractures again noted. Interval proximal right femur ORIF. IMPRESSION: 1. Positive for Small-bowel Obstruction. 2. Proximal right femur ORIF.  Chronic lumbar compression fractures. These results will be called to the ordering clinician or representative by the Radiologist Assistant, and communication documented in the PACS or zVision Dashboard. Electronically Signed   By: Genevie Ann M.D.   On: 02/25/2019 01:49    Assessment/Plan Principal Problem:   Small bowel obstruction (HCC) Active Problems:   Hypertension   Persistent atrial fibrillation (HCC)   Pulmonary hypertension (HCC)   Protein-calorie malnutrition, severe   Vomiting   ARF (acute renal failure) (H[facility])   SBO (small bowel obstruction) ([place])    1. Small bowel obstruction -I have ordered CT abdomen pelvis with oral contrast and consulted general surgery and  place patient on NG tube suction.  IV fluids n.p.o.  Note that patient has history of rectal cancer. 2. Acute renal failure with mild hyperkalemia had received Kayexalate.  Follow metabolic panel closely gently hydrate. 3. Possible hematemesis for which we will follow serial CBCs.  Protonix for now.  Holding apixaban.  If no significant drop in hemoglobin or no definite evidence of GI bleed may restart anticoagulation likely heparin. 4. History of A. fib and complete heart block status post pacemaker.  Holding apixaban for now until we make sure there is no gross bleeding. 5. History of CAD has not had any chest pain. 6. Hypertension we will keep patient on as needed IV hydralazine. 7. Acute blood loss anemia follow CBC. 8. Recent hip surgery. 9. History of rectal cancer status post resection follow CT scan.  Given the acute bowel obstruction with possible blood loss anemia will need more than 2 midnight stay in inpatient status. Covid test is pending.  Please update patient's daughter Ms. Brooks Sailors at [phone]  DVT prophylaxis: SCDs. Code Status: Full code. Family Communication: Discussed with patient's son. Disposition Plan: To be determined. Consults called: General surgery. Admission status: Inpatient.   Rise Patience MD [facility] Pager [phone].  If 7PM-7AM, please contact night-coverage [email or web address] Password [facility]  03/19/2019, 4:39 AM

## 2019-02-20 NOTE — Progress Notes (Signed)
Patient lethargic, but easily aroused. Vitals in computer. Report given to nurse on [place], [place]

## 2019-02-20 NOTE — Plan of Care (Signed)
Poc progressing.  

## 2019-02-20 NOTE — Progress Notes (Signed)
Dr. Hal Hope assessed Ms. Dallaire, she will be transferred to Telemetry floor.  Ordered was given to charge Nurse, also the following orders.  1. NG tube Placement  2. NPO.  Awaiting Transfer we will continue to monitor.

## 2019-02-20 NOTE — Progress Notes (Signed)
[facility] Surgery Consult Note   Hannah Mercado [facility] 11-03-1922  [id number].    Requesting MD: Gean Birchwood Chief Complaint/Reason for Consult: SBO vs ileus  HPI:  Hannah Mercado is a 85yo female PMH HTN, CAD, pacemaker, pulmonary fibrosis/pulmonary HTN, CKD, A fib on eliquis (last dose 02/18/19), and recent right intertrochanteric femur fracture s/p IMN 02/17/19 by Dr. Lyla Glassing, who was discharged from the hospital yesterday to inpatient rehab. States that over the last 2 days she has had worsening abdominal pain and decreased appetite. She feels bloated. She developed nausea last night and had multiple bouts of emesis. Abdominal xray followed by CT scan were obtained. This reports significantly dilated stomach, dilatation of the duodenum and jejunum with transition point in the right hemipelvis; suspect SBO related to adhesions. General surgery asked to see.  Currently she is feeling much better. N/v has resolved and bloating is less. No flatus. She does not think that she has had a BM since admission.  She has had multiple prior abdominal surgeries including Laparoscopic Left oophorectomy and low anterior resection 02/08/2011 by Dr. Barry Dienes for rectal cancer; appendectomy; surgery for ectopic pregnancy; vaginal hysterectomy.  Lives at home independently   ROS: Review of Systems  Constitutional: Negative.   HENT: Negative.   Eyes: Negative.   Respiratory: Negative.   Cardiovascular: Negative.   Gastrointestinal: Positive for abdominal pain, constipation, nausea and vomiting.  Genitourinary: Negative.   Musculoskeletal: Positive for joint pain.  Skin: Negative.   Neurological: Negative.     All systems reviewed and otherwise negative except for as above  Family History  Problem Relation Age of Onset  . Heart disease Father   . Lung cancer Brother        x 2  . Colon cancer Neg Hx     Past Medical History:  Diagnosis Date  . Allergy history unknown    . Anxiety   . Asthmatic bronchitis    use inhaler prn   . Atrial fibrillation ([place]) 10/16/2015  . Blood transfusion    ? with ectopic pregnancy 50 yrs ago   . Cardiac pacemaker in situ 02/24/2016  . Carotid artery disease ([place]) 03/12/2015  . Chronic venous insufficiency 09/11/2015  . Diaphragmatic hernia 07/31/2008   Qualifier: Diagnosis of  By: Lenna Gilford MD, Deborra Medina   . Diverticulosis of colon without hemorrhage 08/02/2012  . DJD (degenerative joint disease)   . Edema 09/11/2015  . GERD (gastroesophageal reflux disease)   . Hiatal hernia   . History of pneumonia    right middle lobe  . Hypercholesteremia   . HYPERCHOLESTEROLEMIA 04/19/2007   Qualifier: Diagnosis of  By: Julien Girt CMA, Marliss Czar    . Hypertension 04/14/2011  . Mitral valve disorder 04/20/2007   Qualifier: Diagnosis of  By: Lenna Gilford MD, Deborra Medina   . Osteopenia   . Persistent atrial fibrillation ([place])   . PULMONARY FIBROSIS, POSTINFLAMMATORY 07/31/2008   Qualifier: Diagnosis of  By: Lenna Gilford MD, Deborra Medina   . Rectal cancer ([place])   . Right carotid bruit 08/28/2014    Past Surgical History:  Procedure Laterality Date  . APPENDECTOMY    . COLON RESECTION  02/08/2011   Procedure: LAPAROSCOPIC SIGMOID COLON RESECTION;  Surgeon: Stark Klein, MD;  Location: [facility];  Service: General;  Laterality: N/A;  Laparoscopic Lower Anterior Bowel Resection  . COLON SURGERY    . COLONOSCOPY  11/27/10   rectal cancer, small sigmoid adenoma, diverticulosis  . ECTOPIC PREGNANCY SURGERY     Midline incision  .  EP IMPLANTABLE DEVICE N/A 10/16/2015   Procedure: Pacemaker Implant;  Surgeon: Will Meredith Leeds, MD;  Location: [place] [facility];  Service: Cardiovascular;  Laterality: N/A;  . FEMUR IM NAIL Right 02/17/2019   Procedure: INTRAMEDULLARY (IM) NAIL FEMORAL;  Surgeon: Rod Can, MD;  Location: [place];  Service: Orthopedics;  Laterality: Right;  . HAMMER TOE SURGERY  1995   Dr. Silverio Decamp  . Laparoscopic Left oophorectomy, laparoscopic low  anterior resection, placement of OnQ pain pump  02/08/2011  . NASAL SINUS SURGERY  1994  . OVARIAN CYST REMOVAL  02/08/2011   Procedure: OVARIAN CYSTECTOMY;  Surgeon: Stark Klein, MD;  Location: [facility];  Service: General;  Laterality: Left;  Removal of Left Ovary and Pelvic Mass  . VAGINAL HYSTERECTOMY  1980's    Social History:  reports that she has never smoked. She has never used smokeless tobacco. She reports current alcohol use. She reports that she does not use drugs.  Allergies:  Allergies  Allergen Reactions  . Sulfonamide Derivatives Nausea And Vomiting    Happened a long time ago, does not remember the other reaction she had.    Medications Prior to Admission  Medication Sig Dispense Refill  . albuterol (PROVENTIL HFA;VENTOLIN HFA) 108 (90 BASE) MCG/ACT inhaler Inhale 2 puffs into the lungs every 6 (six) hours as needed. Wheezing     . alendronate (FOSAMAX) 70 MG tablet Take 70 mg by mouth once a week.    Marland Kitchen amLODipine (NORVASC) 5 MG tablet TAKE 1 TABLET BY MOUTH EVERY DAY (Patient taking differently: Take 5 mg by mouth daily. ) 30 tablet 5  . apixaban (ELIQUIS) 2.5 MG TABS tablet Take 1 tablet (2.5 mg total) by mouth 2 (two) times daily. 60 tablet 5  . ascorbic acid (VITAMIN C) 250 MG CHEW Chew 250 mg by mouth daily.     Marland Kitchen atorvastatin (LIPITOR) 20 MG tablet Take 0.5 tablets (10 mg total) by mouth daily. 90 tablet 1  . Calcium Carbonate-Vitamin D (CALCIUM-VITAMIN D) 500-200 MG-UNIT per tablet Take 2 tablets by mouth daily.     . Cholecalciferol (VITAMIN D3) 1000 UNITS CAPS Take 1 capsule by mouth daily.     Marland Kitchen docusate sodium (COLACE) 100 MG capsule Take 1 capsule (100 mg total) by mouth 2 (two) times daily. 10 capsule 0  . furosemide (LASIX) 20 MG tablet TAKE 1 TABLET BY MOUTH AS NEEDED FOR SWELLING (Patient taking differently: Take 20 mg by mouth daily as needed for fluid (swelling). ) 30 tablet 2  . gabapentin (NEURONTIN) 300 MG capsule Take 1 capsule (300 mg total) by mouth  at bedtime. 30 capsule 0  . HYDROcodone-acetaminophen (NORCO/VICODIN) 5-325 MG tablet Take 1-2 tablets by mouth every 6 (six) hours as needed for moderate pain. 30 tablet 0  . methocarbamol (ROBAXIN) 500 MG tablet Take 1 tablet (500 mg total) by mouth every 6 (six) hours as needed for muscle spasms. 30 tablet 0  . pantoprazole (PROTONIX) 40 MG tablet Take 1 tablet (40 mg total) by mouth daily. 30 tablet 0  . vitamin B-12 (CYANOCOBALAMIN) 250 MCG tablet Take 250 mcg by mouth daily.     . vitamin E (VITAMIN E) 200 UNIT capsule Take 200 Units by mouth daily.       Prior to Admission medications   Medication Sig Start Date End Date Taking? Authorizing Provider  albuterol (PROVENTIL HFA;VENTOLIN HFA) 108 (90 BASE) MCG/ACT inhaler Inhale 2 puffs into the lungs every 6 (six) hours as needed. Wheezing  10/09/10  Noralee Space, MD  alendronate (FOSAMAX) 70 MG tablet Take 70 mg by mouth once a week. 11/26/18   [provider]  amLODipine (NORVASC) 5 MG tablet TAKE 1 TABLET BY MOUTH EVERY DAY Patient taking differently: Take 5 mg by mouth daily.  08/31/18   Noralee Space, MD  apixaban (ELIQUIS) 2.5 MG TABS tablet Take 1 tablet (2.5 mg total) by mouth 2 (two) times daily. 10/11/18   Camnitz, Ocie Doyne, MD  ascorbic acid (VITAMIN C) 250 MG CHEW Chew 250 mg by mouth daily.     [provider]  atorvastatin (LIPITOR) 20 MG tablet Take 0.5 tablets (10 mg total) by mouth daily. 09/08/17   Noralee Space, MD  Calcium Carbonate-Vitamin D (CALCIUM-VITAMIN D) 500-200 MG-UNIT per tablet Take 2 tablets by mouth daily.     [provider]  Cholecalciferol (VITAMIN D3) 1000 UNITS CAPS Take 1 capsule by mouth daily.     [provider]  docusate sodium (COLACE) 100 MG capsule Take 1 capsule (100 mg total) by mouth 2 (two) times daily. 02/19/19   Shawna Clamp, MD  furosemide (LASIX) 20 MG tablet TAKE 1 TABLET BY MOUTH AS NEEDED FOR SWELLING Patient taking differently: Take 20 mg by  mouth daily as needed for fluid (swelling).  09/13/18   Noralee Space, MD  gabapentin (NEURONTIN) 300 MG capsule Take 1 capsule (300 mg total) by mouth at bedtime. 02/19/19   Shawna Clamp, MD  HYDROcodone-acetaminophen (NORCO/VICODIN) 5-325 MG tablet Take 1-2 tablets by mouth every 6 (six) hours as needed for moderate pain. 02/19/19   Shawna Clamp, MD  methocarbamol (ROBAXIN) 500 MG tablet Take 1 tablet (500 mg total) by mouth every 6 (six) hours as needed for muscle spasms. 02/19/19   Shawna Clamp, MD  pantoprazole (PROTONIX) 40 MG tablet Take 1 tablet (40 mg total) by mouth daily. 02/22/2019   Shawna Clamp, MD  vitamin B-12 (CYANOCOBALAMIN) 250 MCG tablet Take 250 mcg by mouth daily.     [provider]  vitamin E (VITAMIN E) 200 UNIT capsule Take 200 Units by mouth daily.     [provider]    Blood pressure (!) 113/54, pulse 63, temperature 97.7 F (36.5 C), temperature source Oral, resp. rate 19, weight 61.3 kg, SpO2 (!) 88 %. Physical Exam: General: pleasant, WD/WN white female who is laying in bed in NAD HEENT: head is normocephalic, atraumatic.  Sclera are noninjected.  Pupils equal and round.  Ears and nose without any masses or lesions.  Mouth is dry. Dentition fair Heart: regular, rate, and rhythm.  No obvious murmurs, gallops, or rubs noted.  Palpable pedal pulses bilaterally Lungs: CTAB, no wheezes, rhonchi, or rales noted.  Respiratory effort nonlabored Abd: well healed midline incision, soft, distended, nontender, no masses, hernias, or organomegaly MS: calves soft and nontender without edema Skin: warm and dry with no masses, lesions, or rashes Psych: A&Ox3 with an appropriate affect. Neuro: cranial nerves grossly intact, extremity CSM intact bilaterally, normal speech  Results for orders placed or performed during the hospital encounter of 03/22/2019 (from the past 48 hour(s))  Glucose, capillary     Status: Abnormal   Collection Time: 03/03/2019  5:58 AM   Result Value Ref Range   Glucose-Capillary 186 (H) 70 - 99 mg/dL  CBC     Status: Abnormal   Collection Time: 02/24/2019  7:55 AM  Result Value Ref Range   WBC 7.0 4.0 - 10.5 K/uL   RBC 2.72 (L) 3.87 -  5.11 MIL/uL   Hemoglobin 9.2 (L) 12.0 - 15.0 g/dL   HCT 28.2 (L) 36.0 - 46.0 %   MCV 103.7 (H) 80.0 - 100.0 fL   MCH 33.8 26.0 - 34.0 pg   MCHC 32.6 30.0 - 36.0 g/dL   RDW 13.6 11.5 - 15.5 %   Platelets 301 150 - 400 K/uL   nRBC 0.4 (H) 0.0 - 0.2 %    Comment: Performed at [facility] [address], [place], [place] [address]  Hemoglobin A1c     Status: None   Collection Time: 03/22/2019  7:55 AM  Result Value Ref Range   Hgb A1c MFr Bld 5.5 4.8 - 5.6 %    Comment: (NOTE) Pre diabetes:          5.7%-6.4% Diabetes:              >6.4% Glycemic control for   <7.0% adults with diabetes    Mean Plasma Glucose 111.15 mg/dL    Comment: Performed at [facility] [address] [place], [place] [address]   Ct Abdomen Pelvis Wo Contrast  Result Date: 02/27/2019 CLINICAL DATA:  Generalized abdominal distension following orthopedic surgery on 02/17/2019, recent plain film suggests small bowel obstruction. EXAM: CT ABDOMEN AND PELVIS WITHOUT CONTRAST TECHNIQUE: Multidetector CT imaging of the abdomen and pelvis was performed following the standard protocol without IV contrast. COMPARISON:  03/01/2019 plain film, CT from 03/07/2017 FINDINGS: Lower chest: Patchy scarring is noted in the bases bilaterally. No focal effusion or nodularity is seen. Aortic atherosclerotic calcifications are seen. Sliding-type hiatal hernia is noted with contrast in the distal esophagus. Hepatobiliary: No focal liver abnormality is seen. No gallstones, gallbladder wall thickening, or biliary dilatation. Pancreas: Unremarkable. No pancreatic ductal dilatation or surrounding inflammatory changes. Spleen: Normal in size without focal abnormality. Adrenals/Urinary Tract: Adrenal glands are within normal limits.  No renal calculi or obstructive changes are noted. Renal cystic changes are noted similar to that seen on prior CT examination. Bladder is well distended. Stomach/Bowel: Postsurgical changes are noted deep in the pelvis consistent with prior sigmoid resection. The anastomosis is widely patent. Colon demonstrates diffuse diverticular change without evidence of diverticulitis. The colon demonstrates some mild fecal material although is predominately decompressed. The appendix is not seen consistent with a prior surgical history. Stomach is significantly distended with contrast material. Dilatation of the duodenum and jejunum is seen. This extends at least to the distal jejunum and likely proximal ileum. There is a transition point in the right hemipelvis best seen on sagittal image 62 of series 3 and coronal image fifty-eight of series 6. This is likely related to adhesions and consistent with at least a partial small bowel obstruction. No definitive mass lesion is seen. Vascular/Lymphatic: Aortic atherosclerosis. No enlarged abdominal or pelvic lymph nodes. Reproductive: Status post hysterectomy. No adnexal masses. Other: No abdominal wall hernia or abnormality. No abdominopelvic ascites. Musculoskeletal: Postsurgical changes are noted in the proximal right femur. Degenerative changes of lumbar spine are noted. IMPRESSION: Changes consistent with small-bowel obstruction in the right deep pelvis likely related to adhesions as no mass lesion is seen. This appears to lie in the proximal aspect of the ileum. Follow-up films are recommended. Sliding-type hiatal hernia. Recent postoperative changes in the proximal right hip. Stable renal cysts and bibasilar lung scarring. Electronically Signed   By: Inez Catalina M.D.   On: 03/21/2019 08:39   Dg Abd Portable 1v  Result Date: 03/22/2019 CLINICAL DATA:  85 year old female is  vomiting blood. EXAM: PORTABLE ABDOMEN - 1 VIEW COMPARISON:  CT Abdomen and Pelvis 03/07/2017.  lumbar radiographs 03/18/2018. FINDINGS: Portable AP supine view at 0137 hours. Dilated gas-filled small bowel in the lower abdomen and pelvis measures up to 51 millimeters diameter. Paucity of bowel gas in the upper abdomen, no transverse colon or stomach air identified. No pneumoperitoneum is evident on this supine view. There does appear to be some gas in the ascending and descending colon. Lung bases are not included. Chronic cardiac pacemaker lead. Multilevel lumbar compression fractures again noted. Interval proximal right femur ORIF. IMPRESSION: 1. Positive for Small-bowel Obstruction. 2. Proximal right femur ORIF.  Chronic lumbar compression fractures. These results will be called to the ordering clinician or representative by the Radiologist Assistant, and communication documented in the PACS or zVision Dashboard. Electronically Signed   By: Genevie Ann M.D.   On: 03/15/2019 01:49   Anti-infectives (From admission, onward)   None        Assessment/Plan HTN CAD Pacemaker in place Pulmonary fibrosis/pulmonary HTN CKD A fib on eliquis (last dose 02/18/19) - please hold eliquis, ok for heparin gtt Right intertrochanteric femur fracture s/p IMN 02/17/19 by Dr. Lyla Glassing Hiatal hernia AKI  SBO vs Ileus - Patient with SBO vs ileus, more likely ileus related to her recent orthopedic surgery. Recommend NG tube for decompression, bowel rest. Limit narcotics. Monitor electrolytes and keep Mag >2 and K >4. Mobilize, continue therapies.  Will continue to follow.  ID - none VTE - SCDs, please hold eliquis, ok for heparin gtt FEN - IVF, NPO/NGT to LIWS Foley - none Follow up - TBD  Wellington Hampshire, [facility] Surgery 03/22/2019, 9:19 AM Please see Amion for pager number during day hours 7:00am-4:30pm

## 2019-02-20 NOTE — Progress Notes (Signed)
Inpatient Rehabilitation  Patient information reviewed and entered into eRehab system by Braysen Cloward M. Tushar Enns, M.A., CCC/SLP, PPS Coordinator.  Information including medical coding, functional ability and quality indicators will be reviewed and updated through discharge.    

## 2019-02-20 NOTE — Progress Notes (Signed)
Spoke with General surgery, request that patient have NG tube placed per order. Ok per Dr. Roger Shelter for patient to be transferred to med-telemetry floor.

## 2019-02-20 NOTE — Progress Notes (Signed)
Received call from [place] @ 12:30 reporting Hannah Mercado had a bout of emesis earlier of 600 cc per shift report and another bout of emesis of 600 cc of brown emesis at 12:30. Gastroccult was positive. KUB was ordered. Blood work was ordered awaiting results.  KUB Results:  IMPRESSION: 1. Positive for Small-bowel Obstruction. 2. Proximal right femur ORIF.  Chronic lumbar compression fractures.  CBC was reviewed.  Call placed to hospitalist, they will come and assess and  Evaluate Hannah Mercado. BMP pending.  IVF NS @ 50 cc hr ordered. We will continue to monitor.

## 2019-02-20 NOTE — IPOC Note (Signed)
Pt transferred back to acute for SBO. No IPOC completed

## 2019-02-21 DIAGNOSIS — Z515 Encounter for palliative care: Secondary | ICD-10-CM

## 2019-02-21 DIAGNOSIS — Z7189 Other specified counseling: Secondary | ICD-10-CM

## 2019-02-21 LAB — GLUCOSE, CAPILLARY
Glucose-Capillary: 149 mg/dL — ABNORMAL HIGH (ref 70–99)
Glucose-Capillary: 130 mg/dL — ABNORMAL HIGH (ref 70–99)

## 2019-02-21 LAB — BASIC METABOLIC PANEL
Anion gap: 13 (ref 5–15)
BUN: 106 mg/dL — ABNORMAL HIGH (ref 8–23)
CO2: 32 mmol/L (ref 22–32)
Calcium: 7.4 mg/dL — ABNORMAL LOW (ref 8.9–10.3)
Chloride: 92 mmol/L — ABNORMAL LOW (ref 98–111)
Creatinine, Ser: 2.65 mg/dL — ABNORMAL HIGH (ref 0.44–1.00)
GFR calc Af Amer: 17 mL/min — ABNORMAL LOW (ref >60)
GFR calc non Af Amer: 15 mL/min — ABNORMAL LOW (ref >60)
Glucose, Bld: 147 mg/dL — ABNORMAL HIGH (ref 70–99)
Potassium: 4.5 mmol/L (ref 3.5–5.1)
Sodium: 137 mmol/L (ref 135–145)

## 2019-02-21 LAB — MAGNESIUM: Magnesium: 2.5 mg/dL — ABNORMAL HIGH (ref 1.7–2.4)

## 2019-02-21 MED ORDER — SODIUM CHLORIDE 0.9 % IV SOLN: INTRAVENOUS | Status: DC

## 2019-02-21 NOTE — Consult Note (Signed)
Consultation Note Date: 02/21/2019   Patient Name: Hannah Mercado  DOB: 1922-12-16  MRN: [id number]  Age / Sex: 85 y.o., female  PCP: Patient, No Pcp Per Referring Physician: Deatra James, MD  Reason for Consultation: Establishing goals of care  HPI/Patient Profile: 85 y.o. female  with past medical history of rectal cancer, a fib, a fib with pacemaker, HTN, HLD, chronic renal insufficiency, pulmonary fibrosis, hip fracture- was recovering in CIR- admitted on 03/01/2019 with nausea and vomiting- workup reveals small bowel obstruction- likely illeus from recent hip surgery. Has NG placed. Palliative medicine team consulted for [place] and code status discussion.    Clinical Assessment and Goals of Care: Patient lying in bed. Complains of pain in throat r/t NG tube. Requesting water so that she can speak. Mouthcare was provided- with swabbing she was able to speak softly.  She awoke easily, and began by telling me that she "just lies there thinking about how a simple slip can lead to so much trouble". She tells me she realizes she is very sick, but is praying that she survives this. Her main [place] is to recover and be able to discharge home. She spent a great amount of time tell me about her children- Hannah Mercado, and Hannah Mercado. She requested that I call them. She also spent time telling me about her Christmas decorations and how she hopes to see Christmas.  Code discussion was not discussed- I am attempting to arrange meeting with her son or daughter present and will then discuss code status.   Primary Decision Maker PATIENT    SUMMARY OF RECOMMENDATIONS -Continue current care -I am attempting to reach Hannah Mercado to arrange time to meet - have left him messages requesting return call and meeting time    Code Status/Advance Care Planning:  DNR  Palliative Prophylaxis:   [place] and Frequent Pain Assessment   Additional Recommendations (Limitations, Scope, Preferences):  Full Scope Treatment  Prognosis:    Unable to determine  Discharge Planning: To Be Determined  Primary Diagnoses: Present on Admission: . Pulmonary hypertension ([place]) . Protein-calorie malnutrition, severe . Persistent atrial fibrillation ([place]) . Hypertension . Vomiting . ARF (acute renal failure) ([place]) . SBO (small bowel obstruction) ([place])   I have reviewed the medical record, interviewed the patient and family, and examined the patient. The following aspects are pertinent.  Past Medical History:  Diagnosis Date  . Allergy history unknown   . Anxiety   . Asthmatic bronchitis    use inhaler prn   . Atrial fibrillation ([place]) 10/16/2015  . Blood transfusion    ? with ectopic pregnancy 50 yrs ago   . Cardiac pacemaker in situ 02/24/2016  . Carotid artery disease ([place]) 03/12/2015  . Chronic venous insufficiency 09/11/2015  . Diaphragmatic hernia 07/31/2008   Qualifier: Diagnosis of  By: Lenna Gilford MD, Deborra Medina   . Diverticulosis of colon without hemorrhage 08/02/2012  . DJD (degenerative joint disease)   . Edema 09/11/2015  . GERD (gastroesophageal reflux disease)   . Hiatal hernia   .  History of pneumonia    right middle lobe  . Hypercholesteremia   . HYPERCHOLESTEROLEMIA 04/19/2007   Qualifier: Diagnosis of  By: Julien Girt CMA, Marliss Czar    . Hypertension 04/14/2011  . Mitral valve disorder 04/20/2007   Qualifier: Diagnosis of  By: Lenna Gilford MD, Deborra Medina   . Osteopenia   . Persistent atrial fibrillation ([place])   . PULMONARY FIBROSIS, POSTINFLAMMATORY 07/31/2008   Qualifier: Diagnosis of  By: Lenna Gilford MD, Deborra Medina   . Rectal cancer ([place])   . Right carotid bruit 08/28/2014   Social History   Socioeconomic History  . Marital status: Widowed    Spouse name: Not on file  . Number of children: 2  . Years of education: Not on file  . Highest education level: Not on file  Occupational History  . Occupation: housewife    Employer:  RETIRED  Social Needs  . Financial resource strain: Not on file  . Food insecurity    Worry: Not on file    Inability: Not on file  . Transportation needs    Medical: Not on file    Non-medical: Not on file  Tobacco Use  . Smoking status: Never Smoker  . Smokeless tobacco: Never Used  Substance and Sexual Activity  . Alcohol use: Yes  . Drug use: No  . Sexual activity: Not on file  Lifestyle  . Physical activity    Days per week: Not on file    Minutes per session: Not on file  . Stress: Not on file  Relationships  . Social Herbalist on phone: Not on file    Gets together: Not on file    Attends religious service: Not on file    Active member of club or organization: Not on file    Attends meetings of clubs or organizations: Not on file    Relationship status: Not on file  Other Topics Concern  . Not on file  Social History Narrative  . Not on file   Family History  Problem Relation Age of Onset  . Heart disease Father   . Lung cancer Brother        x 2  . Colon cancer Neg Hx    Scheduled Meds: . Chlorhexidine Gluconate Cloth  6 each Topical Daily  . pantoprazole (PROTONIX) IV  40 mg Intravenous Q12H   Continuous Infusions: . sodium chloride 75 mL/hr at 02/21/19 1004   PRN Meds:.acetaminophen **OR** acetaminophen, albuterol, hydrALAZINE, ondansetron **OR** ondansetron (ZOFRAN) IV Medications Prior to Admission:  Prior to Admission medications   Medication Sig Start Date End Date Taking? Authorizing Provider  albuterol (PROVENTIL HFA;VENTOLIN HFA) 108 (90 BASE) MCG/ACT inhaler Inhale 2 puffs into the lungs every 6 (six) hours as needed. Wheezing  10/09/10   Noralee Space, MD  alendronate (FOSAMAX) 70 MG tablet Take 70 mg by mouth once a week. 11/26/18   [provider]  amLODipine (NORVASC) 5 MG tablet TAKE 1 TABLET BY MOUTH EVERY DAY Patient taking differently: Take 5 mg by mouth daily.  08/31/18   Noralee Space, MD  apixaban (ELIQUIS) 2.5 MG  TABS tablet Take 1 tablet (2.5 mg total) by mouth 2 (two) times daily. 10/11/18   Camnitz, Ocie Doyne, MD  ascorbic acid (VITAMIN C) 250 MG CHEW Chew 250 mg by mouth daily.     [provider]  atorvastatin (LIPITOR) 20 MG tablet Take 0.5 tablets (10 mg total) by mouth daily. 09/08/17   Noralee Space, MD  Calcium Carbonate-Vitamin D (CALCIUM-VITAMIN D) 500-200 MG-UNIT per tablet Take 2 tablets by mouth daily.     [provider]  Cholecalciferol (VITAMIN D3) 1000 UNITS CAPS Take 1 capsule by mouth daily.     [provider]  docusate sodium (COLACE) 100 MG capsule Take 1 capsule (100 mg total) by mouth 2 (two) times daily. 02/19/19   Shawna Clamp, MD  furosemide (LASIX) 20 MG tablet TAKE 1 TABLET BY MOUTH AS NEEDED FOR SWELLING Patient taking differently: Take 20 mg by mouth daily as needed for fluid (swelling).  09/13/18   Noralee Space, MD  gabapentin (NEURONTIN) 300 MG capsule Take 1 capsule (300 mg total) by mouth at bedtime. 02/19/19   Shawna Clamp, MD  HYDROcodone-acetaminophen (NORCO/VICODIN) 5-325 MG tablet Take 1-2 tablets by mouth every 6 (six) hours as needed for moderate pain. 02/19/19   Shawna Clamp, MD  methocarbamol (ROBAXIN) 500 MG tablet Take 1 tablet (500 mg total) by mouth every 6 (six) hours as needed for muscle spasms. 02/19/19   Shawna Clamp, MD  pantoprazole (PROTONIX) 40 MG tablet Take 1 tablet (40 mg total) by mouth daily. 03/14/2019   Shawna Clamp, MD  vitamin B-12 (CYANOCOBALAMIN) 250 MCG tablet Take 250 mcg by mouth daily.     [provider]  vitamin E (VITAMIN E) 200 UNIT capsule Take 200 Units by mouth daily.     [provider]   Allergies  Allergen Reactions  . Sulfonamide Derivatives Nausea And Vomiting    Happened a long time ago, does not remember the other reaction she had.   Review of Systems  Constitutional: Positive for activity change and fatigue.    Physical Exam Vitals signs and nursing note  reviewed.  Constitutional:      Comments: Frail, cachectic  Skin:    Coloration: Skin is pale.  Neurological:     Mental Status: She is alert and oriented to person, place, and time.     Vital Signs: BP (!) 110/46 (BP Location: Right Arm)   Pulse 63   Temp 98 F (36.7 C) (Axillary)   Resp 18   Ht 5\' 7"  (1.702 m)   Wt 58.3 kg   SpO2 97%   BMI 20.13 kg/m  Pain Scale: 0-10   Pain Score: 2    SpO2: SpO2: 97 % O2 Device:SpO2: 97 % O2 Flow Rate: .O2 Flow Rate (L/min): 2 L/min  IO: Intake/output summary:   Intake/Output Summary (Last 24 hours) at 02/21/2019 1335 Last data filed at 02/21/2019 2330 Gross per 24 hour  Intake 1719.62 ml  Output 1100 ml  Net 619.62 ml    LBM: Last BM Date: 02/16/19 Baseline Weight: Weight: 61.3 kg Most recent weight: Weight: 58.3 kg     Palliative Assessment/Data: PPS: 20%     Thank you for this consult. Palliative medicine will continue to follow and assist as needed.   Time In: 1230 Time Out: 1345 Time Total: 75 mins Greater than 50%  of this time was spent counseling and coordinating care related to the above assessment and plan.  Signed by: Mariana Kaufman, AGNP-C Palliative Medicine    Please contact Palliative Medicine Team phone at [phone] for questions and concerns.  For individual provider: See Shea Evans

## 2019-02-21 NOTE — Evaluation (Signed)
Physical Therapy Evaluation Patient Details Name: Hannah Mercado MRN: [id number] DOB: 08/06/1922 Today's Date: 02/21/2019   History of Present Illness  85yo female s/p R hip IM nail placement 02/17/19, was transferred to CIR then had 638m coffee ground emesis. Imaging indicates SOB. Transferred back to acute care due to new SBO and ARF. PMH A-fib, s/p PPM, CAD, HLD, HTN, mitral valve disorder, pulmonary fibrosis, rectal CA, colon resection, peripheral neuropathy  Clinical Impression   Patient received in bed, very pleasant and gracious with PT, willing to "try anything that will help me get better". She required ModA for functional bed mobility with HOB elevated, able to perform sit to stand with MaxA/RW and bed elevated but very effortful and also very tremulous/very weak once standing- had to sit back down with ModA for slow controlled lower after standing for no more than 10-15 seconds. Requested assistance for transfer from floor staff and waited for a considerable amount of time, but no assist available- however patient able to maintain sitting at EOB with min guard for 20-25 minutes while waiting.  Pulse ox signal unreliable at times but seemed to remain in the 90s with activity on 2LPM O2.  Will require +2 for safety to progress mobility due to severe deconditioning as well as multiple critical lines/leads. Returned to bed with MFillmoreand was able to use BLEs to push herself up in the bed with cues from therapist. She was left in bed with all needs met, bed alarm active. Strongly recommend return to CIR moving forward due to high fall risk, severe deconditioning, and loss of independence at this time.     Follow Up Recommendations CIR    Equipment Recommendations  Rolling walker with 5" wheels;3in1 (PT)    Recommendations for Other Services       Precautions / Restrictions Precautions Precautions: Fall;Other (comment) Precaution Comments: NG tube, watch O2 sats, WBAT R  LE Restrictions Weight Bearing Restrictions: Yes RLE Weight Bearing: Weight bearing as tolerated      Mobility  Bed Mobility Overal bed mobility: Needs Assistance Bed Mobility: Supine to Sit;Sit to Supine     Supine to sit: Mod assist;HOB elevated Sit to supine: Mod assist;HOB elevated   General bed mobility comments: ModA for LE management, patient able to manage trunk with HOB elevated. Min guard-MinA for balance due to posterior lean.  Transfers Overall transfer level: Needs assistance Equipment used: Rolling walker (2 wheeled) Transfers: Sit to/from Stand Sit to Stand: Max assist;From elevated surface         General transfer comment: VCs for hand placement and sequencing, performed standing with MaxA with +1 assist twice, too weak and unsteady to transfer with just one person  and will need +2 to safely progress  Ambulation/Gait             General Gait Details: will need +2 for safety  Stairs            Wheelchair Mobility    Modified Rankin (Stroke Patients Only)       Balance Overall balance assessment: Needs assistance Sitting-balance support: Feet supported;Bilateral upper extremity supported Sitting balance-Leahy Scale: Fair Sitting balance - Comments: posterior lean, MinA initially then faded to close min guard for upright   Standing balance support: Bilateral upper extremity supported;During functional activity Standing balance-Leahy Scale: Zero Standing balance comment: reliant on RW and additional external support  Pertinent Vitals/Pain Pain Assessment: Faces Faces Pain Scale: Hurts little more Pain Location: R LE with movement Pain Descriptors / Indicators: Grimacing;Discomfort Pain Intervention(s): Limited activity within patient's tolerance;Monitored during session;Repositioned    Home Living Family/patient expects to be discharged to:: Inpatient rehab Living Arrangements:  Children Available Help at Discharge: Family;Available 24 hours/day   Home Access: Stairs to enter;Elevator   Entrance Stairs-Number of Steps: 1 Home Layout: Two level Home Equipment: Cane - single point      Prior Function           Comments: uses cane when out of house     Hand Dominance   Dominant Hand: Right    Extremity/Trunk Assessment   Upper Extremity Assessment Upper Extremity Assessment: Generalized weakness    Lower Extremity Assessment Lower Extremity Assessment: Generalized weakness    Cervical / Trunk Assessment Cervical / Trunk Assessment: Kyphotic  Communication   Communication: No difficulties  Cognition Arousal/Alertness: Awake/alert Behavior During Therapy: WFL for tasks assessed/performed Overall Cognitive Status: Within Functional Limits for tasks assessed                                 General Comments: very pleasant and cooperative with therapy, good awareness of deficits and understanding of safety principles with PT      General Comments      Exercises     Assessment/Plan    PT Assessment Patient needs continued PT services  PT Problem List Decreased strength;Decreased activity tolerance;Decreased balance;Decreased knowledge of use of DME;Decreased mobility;Decreased safety awareness;Decreased coordination       PT Treatment Interventions DME instruction;Gait training;Stair training;Functional mobility training;Therapeutic activities;Therapeutic exercise;Balance training;Patient/family education    PT Goals (Current goals can be found in the Care Plan section)  Acute Rehab PT Goals Patient Stated Goal: get better and go to rehab PT Goal Formulation: With patient Time For Goal Achievement: 02/22/2019 Potential to Achieve Goals: Good    Frequency Min 4X/week   Barriers to discharge        Co-evaluation               AM-PAC PT "6 Clicks" Mobility  Outcome Measure Help needed turning from your back to  your side while in a flat bed without using bedrails?: A Lot Help needed moving from lying on your back to sitting on the side of a flat bed without using bedrails?: A Lot Help needed moving to and from a bed to a chair (including a wheelchair)?: A Lot Help needed standing up from a chair using your arms (e.g., wheelchair or bedside chair)?: A Lot Help needed to walk in hospital room?: A Lot Help needed climbing 3-5 steps with a railing? : Total 6 Click Score: 11    End of Session Equipment Utilized During Treatment: Gait belt Activity Tolerance: Patient limited by fatigue Patient left: in bed;with call bell/phone within reach;with bed alarm set Nurse Communication: Mobility status;Need for lift equipment PT Visit Diagnosis: Difficulty in walking, not elsewhere classified (R26.2);Muscle weakness (generalized) (M62.81);History of falling (Z91.81);Unsteadiness on feet (R26.81)    Time: 1410-1505 PT Time Calculation (min) (ACUTE ONLY): 55 min   Charges:   PT Evaluation $PT Eval Moderate Complexity: 1 Mod PT Treatments $Therapeutic Activity: 38-52 mins        Windell Norfolk, DPT, PN1   Supplemental Physical Therapist [place]    Pager [phone] Acute Rehab Office [phone]

## 2019-02-21 NOTE — Progress Notes (Signed)
Spoke with son [name]) concerning patient's code status. Patient's code status currently is full code. Prior to going to CIR, she was DNR. Palliative consulted and made a note that patient was DNR. Spoke with patient and stated that she did not want to be hooked up to a machine.   Clarified with son. Son wants to keep patient full code until he and his sister can talk with the doctor. Will continue to monitor.  Will notified on call [facility]) of decision.

## 2019-02-21 NOTE — Progress Notes (Signed)
[facility] Progress Note     Subjective: CC-  Sleeping heavily this morning. Snoring, difficult to arouse. O2 sats stable.  NG with 1100cc out last 24 hours. No bowel function. Foley placed yesterday afternoon due to retention.  Objective: Vital signs in last 24 hours: Temp:  [98.3 F (36.8 C)-99.7 F (37.6 C)] 98.5 F (36.9 C) (12/02 0437) Pulse Rate:  [61-75] 63 (12/02 0703) Resp:  [18-33] 19 (12/02 0703) BP: (115-121)/(42-53) 115/50 (12/02 0437) SpO2:  [88 %-99 %] 99 % (12/02 0703) Weight:  [58.3 kg] 58.3 kg (12/02 0703) Last BM Date: 02/16/19  Intake/Output from previous day: 12/01 0701 - 12/02 0700 In: 1719.6 [I.V.:1719.6] Out: 1100 [Emesis/NG output:1100] Intake/Output this shift: Total I/O In: -  Out: 850 [Urine:850]  PE: Gen:  Sleeping Pulm:  Rate and effort normal Abd: Soft, distended, nontender, no HSM Skin: warm and dry  Lab Results:  Recent Labs    03/14/2019 0755 02/27/2019 1221  WBC 7.0 5.5  HGB 9.2* 9.5*  HCT 28.2* 29.2*  PLT 301 308   BMET Recent Labs    03/21/2019 0154 02/21/19 0301  NA 137 137  K 5.3* 4.5  CL 90* 92*  CO2 29 32  GLUCOSE 194* 147*  BUN 83* 106*  CREATININE 2.31* 2.65*  CALCIUM 8.7* 7.4*   PT/INR No results for input(s): LABPROT, INR in the last 72 hours. CMP     Component Value Date/Time   NA 137 02/21/2019 0301   K 4.5 02/21/2019 0301   CL 92 (L) 02/21/2019 0301   CO2 32 02/21/2019 0301   GLUCOSE 147 (H) 02/21/2019 0301   BUN 106 (H) 02/21/2019 0301   CREATININE 2.65 (H) 02/21/2019 0301   CREATININE 1.08 (H) 10/09/2015 1130   CALCIUM 7.4 (L) 02/21/2019 0301   PROT 7.1 02/22/2019 0154   ALBUMIN 3.1 (L) 03/21/2019 0154   AST 25 02/25/2019 0154   ALT 7 02/26/2019 0154   ALKPHOS 48 02/24/2019 0154   BILITOT 1.5 (H) 03/20/2019 0154   GFRNONAA 15 (L) 02/21/2019 0301   GFRAA 17 (L) 02/21/2019 0301   Lipase  No results found for: LIPASE     Studies/Results: Ct Abdomen Pelvis Wo  Contrast  Result Date: 03/07/2019 CLINICAL DATA:  Generalized abdominal distension following orthopedic surgery on 02/17/2019, recent plain film suggests small bowel obstruction. EXAM: CT ABDOMEN AND PELVIS WITHOUT CONTRAST TECHNIQUE: Multidetector CT imaging of the abdomen and pelvis was performed following the standard protocol without IV contrast. COMPARISON:  03/03/2019 plain film, CT from 03/07/2017 FINDINGS: Lower chest: Patchy scarring is noted in the bases bilaterally. No focal effusion or nodularity is seen. Aortic atherosclerotic calcifications are seen. Sliding-type hiatal hernia is noted with contrast in the distal esophagus. Hepatobiliary: No focal liver abnormality is seen. No gallstones, gallbladder wall thickening, or biliary dilatation. Pancreas: Unremarkable. No pancreatic ductal dilatation or surrounding inflammatory changes. Spleen: Normal in size without focal abnormality. Adrenals/Urinary Tract: Adrenal glands are within normal limits. No renal calculi or obstructive changes are noted. Renal cystic changes are noted similar to that seen on prior CT examination. Bladder is well distended. Stomach/Bowel: Postsurgical changes are noted deep in the pelvis consistent with prior sigmoid resection. The anastomosis is widely patent. Colon demonstrates diffuse diverticular change without evidence of diverticulitis. The colon demonstrates some mild fecal material although is predominately decompressed. The appendix is not seen consistent with a prior surgical history. Stomach is significantly distended with contrast material. Dilatation of the duodenum and jejunum is seen. This extends  at least to the distal jejunum and likely proximal ileum. There is a transition point in the right hemipelvis best seen on sagittal image 62 of series 3 and coronal image fifty-eight of series 6. This is likely related to adhesions and consistent with at least a partial small bowel obstruction. No definitive mass lesion  is seen. Vascular/Lymphatic: Aortic atherosclerosis. No enlarged abdominal or pelvic lymph nodes. Reproductive: Status post hysterectomy. No adnexal masses. Other: No abdominal wall hernia or abnormality. No abdominopelvic ascites. Musculoskeletal: Postsurgical changes are noted in the proximal right femur. Degenerative changes of lumbar spine are noted. IMPRESSION: Changes consistent with small-bowel obstruction in the right deep pelvis likely related to adhesions as no mass lesion is seen. This appears to lie in the proximal aspect of the ileum. Follow-up films are recommended. Sliding-type hiatal hernia. Recent postoperative changes in the proximal right hip. Stable renal cysts and bibasilar lung scarring. Electronically Signed   By: Inez Catalina M.D.   On: 03/16/2019 08:39   Dg Abd 1 View  Result Date: 03/19/2019 CLINICAL DATA:  Gastric catheter replacement EXAM: ABDOMEN - 1 VIEW COMPARISON:  None. FINDINGS: Gastric catheter has been replaced and now lies with the tip within the herniated portion of the stomach in the chest cavity. This could be advanced further into the stomach as the proximal side port lies within the distal esophagus. IMPRESSION: Gastric catheter as described. This could be advanced further into the stomach. Electronically Signed   By: Inez Catalina M.D.   On: 03/19/2019 11:13   Dg Abd 1 View  Result Date: 03/09/2019 CLINICAL DATA:  Check nasogastric catheter placement EXAM: ABDOMEN - 1 VIEW COMPARISON:  None. FINDINGS: Gastric catheter is coiled upon itself within the mid esophagus. This should be withdrawn totally and readvanced. IMPRESSION: Gastric catheter coiled upon itself within the chest. Electronically Signed   By: Inez Catalina M.D.   On: 03/09/2019 11:11   Dg Abd Portable 1v  Result Date: 03/22/2019 CLINICAL DATA:  85 year old female is vomiting blood. EXAM: PORTABLE ABDOMEN - 1 VIEW COMPARISON:  CT Abdomen and Pelvis 03/07/2017. lumbar radiographs 03/18/2018. FINDINGS:  Portable AP supine view at 0137 hours. Dilated gas-filled small bowel in the lower abdomen and pelvis measures up to 51 millimeters diameter. Paucity of bowel gas in the upper abdomen, no transverse colon or stomach air identified. No pneumoperitoneum is evident on this supine view. There does appear to be some gas in the ascending and descending colon. Lung bases are not included. Chronic cardiac pacemaker lead. Multilevel lumbar compression fractures again noted. Interval proximal right femur ORIF. IMPRESSION: 1. Positive for Small-bowel Obstruction. 2. Proximal right femur ORIF.  Chronic lumbar compression fractures. These results will be called to the ordering clinician or representative by the Radiologist Assistant, and communication documented in the PACS or zVision Dashboard. Electronically Signed   By: Genevie Ann M.D.   On: 02/23/2019 01:49    Anti-infectives: Anti-infectives (From admission, onward)   None       Assessment/Plan HTN CAD Pacemaker in place Pulmonary fibrosis/pulmonary HTN CKD A fib on eliquis (last dose 02/18/19) - please hold eliquis, ok for heparin gtt Right intertrochanteric femur fracture s/p IMN 02/17/19 by Dr. Lyla Glassing Hiatal hernia AKI  SBO vs Ileus - more likely ileus related to recent orthopedic surgery - Continue NG tube decompression. Mobilize, PT/OT. Repeat film in AM.  ID - none VTE - SCDs, eliquis on hold/ ok for heparin gtt FEN - IVF, NPO/NGT to LIWS Foley - placed 12/1  for retention Follow up - TBD   LOS: 1 day    Wellington [place], [facility] 02/21/2019, 8:19 AM Please see Amion for pager number during day hours 7:00am-4:30pm

## 2019-02-21 NOTE — Progress Notes (Signed)
Rehab Admissions Coordinator Note:  Per therapy recommendation, patient was screened by Michel Santee for appropriateness for an Inpatient Acute Rehab Consult.  At this time, we are recommending Inpatient Rehab consult.  I will place an order.   Michel Santee 02/21/2019, 3:38 PM  I can be reached at [phone].

## 2019-02-21 NOTE — Progress Notes (Signed)
PROGRESS NOTE    Patient: Hannah Mercado                            PCP: Patient, No Pcp Per                    DOB: 1923/02/06            DOA: 03/11/2019 [id number]             DOS: 02/21/2019, 1:14 PM   LOS: 1 day   Date of Service: The patient was seen and examined on 02/21/2019  Subjective:   The patient was seen and examined this morning, remained stable awake alert, following command, NG was successfully placed yesterday. Still having abdominal distention, discomfort. Reporting of no gas or bowel movements.  Brief Narrative:   Hannah Mercado  is a pleasant 85 year old female with past medical history of HTN, CAD, pacemaker, pulmonary fibrosis/pulmonary, CKD, A-fib on eliquis (last dose 02/18/19), and recent right intertrochanteric femur fracture s/p IMN 02/17/19 by Dr. Lyla Glassing, who was discharged from the hospital yesterday to inpatient rehab.  Presented with pain, distention, nausea vomiting.  . Patient reported that over the last 2 days she has had worsening abdominal pain and decreased appetite. She feels bloated,with nausea over night and had multiple bouts of emesis.  Abdominal xray followed by CT scan were obtained. This reports significantly dilated stomach, dilatation of the duodenum and jejunum with transition point in the right hemipelvis; suspect SBO related to adhesions.   Patient seems to have a history of prior abdominal surgeries including microscopic oophorectomy, anterior resection on 02/08/2011 by Dr. Barry Dienes for rectal cancer, also history of appendectomy, ectopic pregnancy, vaginal hysterectomy.   02/27/2019 - PTA -600 mL of coffee-ground vomitus -remained stable continue to complain of abdominal pain distention, nausea and vomiting. - general surgery following Remains n.p.o. NG tube was placed-discussed daughter in detail  02/21/2019 NG tube remains in place, abdomen soft distended with tenderness. Hemodynamically stable -noted of elevated  creatinine from baseline 2.6 Hemoglobin stable at 9.5  Assessment & Plan:   Principal Problem:   Small bowel obstruction (HCC) Active Problems:   Hypertension   Persistent atrial fibrillation (HCC)   Pulmonary hypertension (HCC)   Protein-calorie malnutrition, severe   Vomiting   ARF (acute renal failure) (HCC)   SBO (small bowel obstruction) (HCC)   Small bowel obstruction -No abdominal gas reported by patient - Remains distended but soft, tenderness with deep palpation  -CT abdomen pelvis -reviewed confirming SBO on the right -Remain n.p.o. -Continue gentle IV fluid hydration    History of prior abdominal surgeries including microscopic oophorectomy, anterior resection on 02/08/2011 by Dr. Barry Dienes for rectal cancer, also history of appendectomy, ectopic pregnancy, vaginal hysterectomy.  -General surgery -following recommending continue NG tube for decompression,  Acute renal insufficiency/hyperkalemia -Current BUN/creatinine 83/2.31 (baseline around 1.08) >> 2.65  -We will continue gentle IV fluid hydration, -Status post Kayexalate, calcium gluconate if needed for hyper kalemia, potassium 4.5 today  -Monitoring closely -Avoiding nephrotoxins  Hematemesis -Gastric fluid greenish in color, no overt bleeding  -Gastroccult positive -Vomiting was reported by nursing staff but no hematemesis  -Continue IV Protonix -Monitoring H&H, hemoglobin stable at 9.2  -Home medication of apixaban on hold, -Anticipating if patient remained stable next few days to restart heparin or apixaban  H/o  Atrial fibrillation  -with complete heart block, with history of pacemaker in place -On digoxin, currently on  hold apixaban on hold -ruling out overt GI bleed at this time  History of coronary artery disease -May stable denies any chest pain  Hypertension -Currently n.p.o. -Blood pressure remained stable, monitoring -On IV hydralazine  Acute on chronic anemia with possible blood loss  anemia -Controlled H&H, currently hemoglobin stable around 9.2  Status post recent hip surgery -Remained stable  History of rectal cancer  -status post resection follow CT scan.  DVT prophylaxis: SCDs. Code Status: Full code. Family Communication: Discussed current patient situation with her son and daughter Ms. Brooks Sailors Disposition Plan: To be determined. Consults called: General surgery. Admission status: Inpatient  Please update patient's daughter Ms. Brooks Sailors at [phone]   CT abdomen pelvis  IMPRESSION: Changes consistent with small-bowel obstruction in the right deep pelvis likely related to adhesions as no mass lesion is seen. This appears to lie in the proximal aspect of the ileum. Follow-up films are recommended. Sliding-type hiatal hernia. Recent postoperative changes in the proximal right hip. Stable renal cysts and bibasilar lung scarring.  Electronically Signed   By: Inez Catalina M.D.   On: 03/14/2019 08:39     Procedures:   No admission procedures for hospital encounter.   NGT  Antimicrobials:  Anti-infectives (From admission, onward)   None       Medication:  . Chlorhexidine Gluconate Cloth  6 each Topical Daily  . pantoprazole (PROTONIX) IV  40 mg Intravenous Q12H    acetaminophen **OR** acetaminophen, albuterol, hydrALAZINE, ondansetron **OR** ondansetron (ZOFRAN) IV   Objective:   Vitals:   02/21/19 0437 02/21/19 0703 02/21/19 0802 02/21/19 1152  BP: (!) 115/50   (!) 110/46  Pulse: 63 63  63  Resp: 20 19  18   Temp: 98.5 F (36.9 C)   98 F (36.7 C)  TempSrc: Oral   Axillary  SpO2: 98% 99%  97%  Weight:  58.3 kg    Height:   5\' 7"  (1.702 m)     Intake/Output Summary (Last 24 hours) at 02/21/2019 1314 Last data filed at 02/21/2019 0702 Gross per 24 hour  Intake 1719.62 ml  Output 1100 ml  Net 619.62 ml   Filed Weights   03/11/2019 0500 02/21/19 0703  Weight: 61.3 kg 58.3 kg     Examination:   BP  (!) 110/46 (BP Location: Right Arm)   Pulse 63   Temp 98 F (36.7 C) (Axillary)   Resp 18   Ht 5\' 7"  (1.702 m)   Wt 58.3 kg   SpO2 97%   BMI 20.13 kg/m    Physical Exam  Constitution:  Alert, cooperative, no distress,  Psychiatric: Normal and stable mood and affect, cognition intact,   HEENT: NGT in place normocephalic, PERRL, otherwise with in Normal limits  Chest:Chest symmetric Cardio vascular:  S1/S2, RRR, No murmure, No Rubs or Gallops  pulmonary: Clear to auscultation bilaterally, respirations unlabored, negative wheezes / crackles Abdomen: Soft diffusely tender, distended, hypoactive bowel sounds,no masses, no organomegaly Muscular skeletal: Limited exam - in bed, able to move all 4 extremities, Normal strength,  Neuro: CNII-XII intact. , normal motor and sensation, reflexes intact  Extremities: No pitting edema lower extremities, +2 pulses  Skin: Dry, warm to touch, negative for any Rashes, No open wounds Wounds: per nursing documentation       LABs:  CBC Latest Ref Rng & Units 03/11/2019 02/23/2019 03/11/2019  WBC 4.0 - 10.5 K/uL 5.5 7.0 7.3  Hemoglobin 12.0 - 15.0 g/dL 9.5(L) 9.2(L) 9.4(L)  Hematocrit 36.0 - 46.0 %  29.2(L) 28.2(L) 28.0(L)  Platelets 150 - 400 K/uL 308 301 262   CMP Latest Ref Rng & Units 02/21/2019 03/17/2019 02/19/2019  Glucose 70 - 99 mg/dL 147(H) 194(H) -  BUN 8 - 23 mg/dL 106(H) 83(H) -  Creatinine 0.44 - 1.00 mg/dL 2.65(H) 2.31(H) -  Sodium 135 - 145 mmol/L 137 137 -  Potassium 3.5 - 5.1 mmol/L 4.5 5.3(H) 6.0(H)  Chloride 98 - 111 mmol/L 92(L) 90(L) -  CO2 22 - 32 mmol/L 32 29 -  Calcium 8.9 - 10.3 mg/dL 7.4(L) 8.7(L) -  Total Protein 6.5 - 8.1 g/dL - 7.1 -  Total Bilirubin 0.3 - 1.2 mg/dL - 1.5(H) -  Alkaline Phos 38 - 126 U/L - 48 -  AST 15 - 41 U/L - 25 -  ALT 0 - 44 U/L - 7 -        SIGNED: Deatra James, MD, FACP, FHM. [facility],  Pager [phone]  If 7PM-7AM, please contact night-coverage [email or web address] [email or web address] [facility] 02/21/2019, 1:14 PM

## 2019-02-22 ENCOUNTER — Inpatient Hospital Stay (HOSPITAL_COMMUNITY): Payer: Medicare Other

## 2019-02-22 DIAGNOSIS — Z7189 Other specified counseling: Secondary | ICD-10-CM

## 2019-02-22 DIAGNOSIS — N189 Chronic kidney disease, unspecified: Secondary | ICD-10-CM

## 2019-02-22 DIAGNOSIS — N179 Acute kidney failure, unspecified: Secondary | ICD-10-CM

## 2019-02-22 LAB — GLUCOSE, CAPILLARY
Glucose-Capillary: 113 mg/dL — ABNORMAL HIGH (ref 70–99)
Glucose-Capillary: 120 mg/dL — ABNORMAL HIGH (ref 70–99)
Glucose-Capillary: 132 mg/dL — ABNORMAL HIGH (ref 70–99)
Glucose-Capillary: 157 mg/dL — ABNORMAL HIGH (ref 70–99)

## 2019-02-22 LAB — CBC
HCT: 25.8 % — ABNORMAL LOW (ref 36.0–46.0)
Hemoglobin: 8.1 g/dL — ABNORMAL LOW (ref 12.0–15.0)
MCH: 34 pg (ref 26.0–34.0)
MCHC: 31.4 g/dL (ref 30.0–36.0)
MCV: 108.4 fL — ABNORMAL HIGH (ref 80.0–100.0)
Platelets: 268 10*3/uL (ref 150–400)
RBC: 2.38 MIL/uL — ABNORMAL LOW (ref 3.87–5.11)
RDW: 14.5 % (ref 11.5–15.5)
WBC: 10.9 10*3/uL — ABNORMAL HIGH (ref 4.0–10.5)
nRBC: 0.9 % — ABNORMAL HIGH (ref 0.0–0.2)

## 2019-02-22 LAB — COMPREHENSIVE METABOLIC PANEL
ALT: 12 U/L (ref 0–44)
AST: 36 U/L (ref 15–41)
Albumin: 2.5 g/dL — ABNORMAL LOW (ref 3.5–5.0)
Alkaline Phosphatase: 47 U/L (ref 38–126)
Anion gap: 13 (ref 5–15)
BUN: 72 mg/dL — ABNORMAL HIGH (ref 8–23)
CO2: 30 mmol/L (ref 22–32)
Calcium: 7.3 mg/dL — ABNORMAL LOW (ref 8.9–10.3)
Chloride: 100 mmol/L (ref 98–111)
Creatinine, Ser: 1.27 mg/dL — ABNORMAL HIGH (ref 0.44–1.00)
GFR calc Af Amer: 42 mL/min — ABNORMAL LOW (ref >60)
GFR calc non Af Amer: 36 mL/min — ABNORMAL LOW (ref >60)
Glucose, Bld: 138 mg/dL — ABNORMAL HIGH (ref 70–99)
Potassium: 3.4 mmol/L — ABNORMAL LOW (ref 3.5–5.1)
Sodium: 143 mmol/L (ref 135–145)
Total Bilirubin: 1.8 mg/dL — ABNORMAL HIGH (ref 0.3–1.2)
Total Protein: 5.7 g/dL — ABNORMAL LOW (ref 6.5–8.1)

## 2019-02-22 MED ORDER — SODIUM CHLORIDE 0.9 % IV SOLN
250.0000 mg | Freq: Four times a day (QID) | INTRAVENOUS | Status: DC
  Administered 2019-02-22 – 2019-02-28 (×24): 250 mg via INTRAVENOUS
  Filled 2019-02-22 (×30): qty 5

## 2019-02-22 MED ORDER — DEXTROSE-NACL 5-0.45 % IV SOLN
INTRAVENOUS | Status: DC
  Administered 2019-02-22: 09:00:00 via INTRAVENOUS

## 2019-02-22 NOTE — Evaluation (Signed)
Occupational Therapy Evaluation Patient Details Name: Hannah Mercado MRN: [id number] DOB: June 08, 1922 Today's Date: 02/22/2019    History of Present Illness 85yo female s/p R hip IM nail placement 02/17/19, was transferred to CIR then had 644mL coffee ground emesis. Imaging indicates SOB. Transferred back to acute care due to new SBO and ARF. PMH A-fib, s/p PPM, CAD, HLD, HTN, mitral valve disorder, pulmonary fibrosis, rectal CA, colon resection, peripheral neuropathy   Clinical Impression   Pt transferred from CIR where she was participating in intensive rehab. Pt agreeable to working with therapies and demonstrated ability to achieve sitting EOB with min assist and transferred with 2 person moderate assistance to steady with buckling knees. Pt requires set up to max assist for ADL. She will need to return to rehab when medically ready. Will follow acutely.    Follow Up Recommendations  CIR;Supervision/Assistance - 24 hour    Equipment Recommendations  Other (comment)(defer to next venue)    Recommendations for Other Services       Precautions / Restrictions Precautions Precautions: Fall Precaution Comments: NG tube, watch O2 sats, WBAT R LE, knees buckle Restrictions Weight Bearing Restrictions: Yes RLE Weight Bearing: Weight bearing as tolerated      Mobility Bed Mobility Overal bed mobility: Needs Assistance Bed Mobility: Sit to Supine     Supine to sit: Min assist Sit to supine: Min assist   General bed mobility comments: increased time and effort, assist for R LE over EOB  Transfers Overall transfer level: Needs assistance Equipment used: Rolling walker (2 wheeled) Transfers: Sit to/from [facility] Sit to Stand: From elevated surface;+2 physical assistance;Min assist Stand pivot transfers: +2 physical assistance;Mod assist       General transfer comment: pt with knee buckling with transfer    Balance Overall balance assessment: Needs  assistance Sitting-balance support: Feet supported Sitting balance-Leahy Scale: Fair     Standing balance support: Bilateral upper extremity supported;During functional activity Standing balance-Leahy Scale: Poor Standing balance comment: reliant on RW and additional external support                           ADL either performed or assessed with clinical judgement   ADL Overall ADL's : Needs assistance/impaired Eating/Feeding: Independent;Sitting   Grooming: Set up;Sitting   Upper Body Bathing: Set up;Sitting   Lower Body Bathing: Maximal assistance;Sit to/from stand   Upper Body Dressing : Set up;Sitting   Lower Body Dressing: Maximal assistance;Sit to/from stand   Toilet Transfer: Stand-pivot;RW;Moderate assistance;+2 for safety/equipment   Toileting- Water quality scientist and Hygiene: Moderate assistance;Sit to/from stand               Vision Baseline Vision/History: Wears glasses Wears Glasses: At all times Patient Visual Report: No change from baseline       Perception     Praxis      Pertinent Vitals/Pain Pain Assessment: Faces     Hand Dominance Right   Extremity/Trunk Assessment         Cervical / Trunk Assessment Cervical / Trunk Assessment: Kyphotic   Communication Communication Communication: No difficulties   Cognition Arousal/Alertness: Awake/alert Behavior During Therapy: WFL for tasks assessed/performed Overall Cognitive Status: Within Functional Limits for tasks assessed                                     General Comments  Exercises     Shoulder Instructions      Home Living Family/patient expects to be discharged to:: Inpatient rehab Living Arrangements: Children Available Help at Discharge: Family;Available 24 hours/day Type of Home: House Home Access: Stairs to Probation officer of Steps: 1   Home Layout: Two level Alternate Level Stairs-Number of Steps: elevator  to 2nd floor   Bathroom Shower/Tub: Occupational psychologist: Handicapped height     Home Equipment: Cane - single point          Prior Functioning/Environment Level of Independence: Independent with assistive device(s)        Comments: uses cane when out of house, pt golfed until age 25        OT Problem List: Decreased strength;Decreased range of motion;Impaired balance (sitting and/or standing);Pain;Decreased knowledge of use of DME or AE      OT Treatment/Interventions: Self-care/ADL training;DME and/or AE instruction;Patient/family education;Balance training    OT Goals(Current goals can be found in the care plan section) Acute Rehab OT Goals Patient Stated Goal: get better and go to rehab OT Goal Formulation: With patient Time For Goal Achievement: 03/08/19 Potential to Achieve Goals: Good ADL Goals Pt Will Perform Grooming: with min assist;standing Pt Will Perform Lower Body Bathing: with min assist;with adaptive equipment;sit to/from stand Pt Will Perform Lower Body Dressing: with min assist;with adaptive equipment;sit to/from stand Pt Will Transfer to Toilet: with min assist;ambulating;bedside commode Pt Will Perform Toileting - Clothing Manipulation and hygiene: with min assist;sit to/from stand  OT Frequency: Min 2X/week   Barriers to D/C:            Co-evaluation PT/OT/SLP Co-Evaluation/Treatment: Yes Reason for Co-Treatment: For patient/therapist safety   OT goals addressed during session: ADL's and self-care      AM-PAC OT "6 Clicks" Daily Activity     Outcome Measure Help from another person eating meals?: None Help from another person taking care of personal grooming?: A Little Help from another person toileting, which includes using toliet, bedpan, or urinal?: A Lot Help from another person bathing (including washing, rinsing, drying)?: A Lot Help from another person to put on and taking off regular upper body clothing?: A  Little Help from another person to put on and taking off regular lower body clothing?: A Lot 6 Click Score: 16   End of Session Equipment Utilized During Treatment: Gait belt;Rolling walker  Activity Tolerance: Patient tolerated treatment well Patient left: in chair;with call bell/phone within reach;with chair alarm set  OT Visit Diagnosis: Unsteadiness on feet (R26.81);Other abnormalities of gait and mobility (R26.89);Muscle weakness (generalized) (M62.81);Pain                Time: 7619-5093 OT Time Calculation (min): 13 min Charges:  OT General Charges $OT Visit: 1 Visit OT Evaluation $OT Eval Moderate Complexity: 1 Mod  Malka So 02/22/2019, 4:21 PM  Nestor Lewandowsky, OTR/L Acute Rehabilitation Services Pager: [phone] Office: [phone]

## 2019-02-22 NOTE — Progress Notes (Signed)
Physical Therapy Treatment Patient Details Name: Hannah Mercado MRN: [id number] DOB: Jan 14, 1923 Today's Date: 02/22/2019    History of Present Illness 85yo female s/p R hip IM nail placement 02/17/19, was transferred to CIR then had 639mL coffee ground emesis. Imaging indicates SOB. Transferred back to acute care due to new SBO and ARF. PMH A-fib, s/p PPM, CAD, HLD, HTN, mitral valve disorder, pulmonary fibrosis, rectal CA, colon resection, peripheral neuropathy    PT Comments    Pt tolerated treatment well, enthusiastic and eager to mobilize. Pt with knee buckling during upright mobility and continues to require physical assistance for bed mobility and transfers, although less assistance than previous session. Pt continues to demonstrate LE strength and mobility deficits and will benefit from continued acute PT POC to reduce falls risk and restore independent mobility.   Follow Up Recommendations  CIR     Equipment Recommendations  Rolling walker with 5" wheels;3in1 (PT)    Recommendations for Other Services       Precautions / Restrictions Precautions Precautions: Fall Precaution Comments: NG tube, watch O2 sats, WBAT R LE, knees buckle Restrictions Weight Bearing Restrictions: Yes RLE Weight Bearing: Weight bearing as tolerated    Mobility  Bed Mobility Overal bed mobility: Needs Assistance Bed Mobility: Sit to Supine     Supine to sit: Min assist Sit to supine: Min assist   General bed mobility comments: increased time and effort, assist for R LE over EOB  Transfers Overall transfer level: Needs assistance Equipment used: Rolling walker (2 wheeled) Transfers: Sit to/from [facility] Sit to Stand: From elevated surface;+2 physical assistance;Min assist Stand pivot transfers: +2 physical assistance;Mod assist       General transfer comment: pt with knee buckling with transfer  Ambulation/Gait Ambulation/Gait assistance: Mod assist;+2  physical assistance Gait Distance (Feet): 3 Feet Assistive device: Rolling walker (2 wheeled) Gait Pattern/deviations: Step-to pattern Gait velocity: reduced Gait velocity interpretation: <1.31 ft/sec, indicative of household ambulator General Gait Details: pt with 2 noted instances of knee buckling during short ambulation distance from bed to recliner   Stairs             Wheelchair Mobility    Modified Rankin (Stroke Patients Only)       Balance Overall balance assessment: Needs assistance Sitting-balance support: Feet supported Sitting balance-Leahy Scale: Fair Sitting balance - Comments: supervision   Standing balance support: Bilateral upper extremity supported;During functional activity Standing balance-Leahy Scale: Poor Standing balance comment: reliant on RW and additional external support                            Cognition Arousal/Alertness: Awake/alert Behavior During Therapy: WFL for tasks assessed/performed Overall Cognitive Status: Within Functional Limits for tasks assessed                                        Exercises General Exercises - Lower Extremity Ankle Circles/Pumps: AROM;Both;15 reps Gluteal Sets: AROM;Both;15 reps Long Arc Quad: AROM;Both;15 reps Hip Flexion/Marching: AROM;Both;15 reps    General Comments General comments (skin integrity, edema, etc.): VSS      Pertinent Vitals/Pain Pain Assessment: Faces    Home Living Family/patient expects to be discharged to:: Inpatient rehab Living Arrangements: Children Available Help at Discharge: Family;Available 24 hours/day Type of Home: House Home Access: Stairs to enter;Elevator   Home Layout: Two level Home Equipment:  Cane - single point      Prior Function Level of Independence: Independent with assistive device(s)      Comments: uses cane when out of house, pt golfed until age 35   PT Goals (current goals can now be found in the care plan  section) Acute Rehab PT Goals Patient Stated Goal: get better and go to rehab Progress towards PT goals: Progressing toward goals    Frequency    Min 4X/week      PT Plan Current plan remains appropriate    Co-evaluation PT/OT/SLP Co-Evaluation/Treatment: Yes Reason for Co-Treatment: For patient/therapist safety   OT goals addressed during session: ADL's and self-care      AM-PAC PT "6 Clicks" Mobility   Outcome Measure  Help needed turning from your back to your side while in a flat bed without using bedrails?: A Little Help needed moving from lying on your back to sitting on the side of a flat bed without using bedrails?: A Little Help needed moving to and from a bed to a chair (including a wheelchair)?: A Lot Help needed standing up from a chair using your arms (e.g., wheelchair or bedside chair)?: A Lot Help needed to walk in hospital room?: A Lot Help needed climbing 3-5 steps with a railing? : Total 6 Click Score: 13    End of Session Equipment Utilized During Treatment: (none) Activity Tolerance: Patient tolerated treatment well Patient left: in chair;with call bell/phone within reach;with chair alarm set Nurse Communication: Mobility status PT Visit Diagnosis: Difficulty in walking, not elsewhere classified (R26.2);Muscle weakness (generalized) (M62.81);History of falling (Z91.81);Unsteadiness on feet (R26.81)     Time: 8616-8372 PT Time Calculation (min) (ACUTE ONLY): 23 min  Charges:  $Therapeutic Exercise: 8-22 mins                     Zenaida Niece, PT, DPT Acute Rehabilitation Pager: [phone]    Zenaida Niece 02/22/2019, 4:59 PM

## 2019-02-22 NOTE — Plan of Care (Signed)
  Problem: Education: Goal: Knowledge of General Education information will improve Description Including pain rating scale, medication(s)/side effects and non-pharmacologic comfort measures Outcome: Progressing   

## 2019-02-22 NOTE — Progress Notes (Signed)
Inpatient Rehab Admissions:  Inpatient Rehab Consult received.  I met with patient at the bedside for rehabilitation assessment and to discuss goals and expectations of an inpatient rehab admission.  She is hopeful to return to CIR once SBO/ileus resolves.  Will continue to follow for timing and will reach out to her daughter, [name], to touch base as well.   Signed: Shann Medal, PT, DPT Admissions Coordinator [phone] 02/22/19  10:01 AM

## 2019-02-22 NOTE — Progress Notes (Addendum)
PROGRESS NOTE    Patient: Hannah Mercado                            PCP: Patient, No Pcp Per                    DOB: 1922/07/06            DOA: 03/19/2019 LXB:[id number]             DOS: 02/22/2019, 12:46 PM   LOS: 2 days   Date of Service: The patient was seen and examined on 02/22/2019  Subjective:   The patient was seen and examined this morning.  Stable no acute distress Remained stable NG tube in place, reporting of improving abdominal pain and distention.  Subjective report of gas but no bowel movement.  No further emesis were noted gastric content greenish in color.  Hemoglobin dropped to 8.1, no overt signs of bleeding, hemodynamically stable     Brief Narrative:   Hannah Mercado  is a pleasant 85 year old female with past medical history of HTN, CAD, pacemaker, pulmonary fibrosis/pulmonary, CKD, A-fib on eliquis (last dose 02/18/19), and recent right intertrochanteric femur fracture s/p IMN 02/17/19 by Dr. Lyla Glassing, who was discharged from the hospital yesterday to inpatient rehab.  Presented with pain, distention, nausea vomiting.  . Patient reported that over the last 2 days she has had worsening abdominal pain and decreased appetite. She feels bloated,with nausea over night and had multiple bouts of emesis.  Abdominal xray followed by CT scan were obtained. This reports significantly dilated stomach, dilatation of the duodenum and jejunum with transition point in the right hemipelvis; suspect SBO related to adhesions.   Patient seems to have a history of prior abdominal surgeries including microscopic oophorectomy, anterior resection on 02/08/2011 by Dr. Barry Dienes for rectal cancer, also history of appendectomy, ectopic pregnancy, vaginal hysterectomy.   02/26/2019 - PTA -600 mL of coffee-ground vomitus -remained stable continue to complain of abdominal pain distention, nausea and vomiting. - general surgery following Remains n.p.o. NG tube was placed-discussed  daughter in detail  02/21/2019 NG tube remains in place, abdomen soft distended with tenderness. Hemodynamically stable -noted of elevated creatinine from baseline 2.6 Hemoglobin stable at 9.5  02/22/2019  -Patient remained stable, NG tube still in place.  H&H stable, Eliquis and on hold ------------------------------------------------------------------------------------------------------------------------   Assessment & Plan:   Principal Problem:   Small bowel obstruction (HCC) Active Problems:   Hypertension   Persistent atrial fibrillation (HCC)   Pulmonary hypertension (HCC)   Protein-calorie malnutrition, severe   Vomiting   ARF (acute renal failure) (HCC)   SBO (small bowel obstruction) (HCC)   Palliative care by specialist   Goals of care, counseling/discussion   Small bowel obstruction -NG tube in place, 50 mL out, -Subjective report of gas but no bowel movements -Abdomen remained distended, remains soft, worsening tenderness with deep palpation Hypoactive bowel sounds noted   -CT abdomen pelvis -reviewed confirming SBO on the right -Remain n.p.o. -Continue gentle IV fluid hydration    History of prior abdominal surgeries including microscopic oophorectomy, anterior resection on 02/08/2011 by Dr. Barry Dienes for rectal cancer, also history of appendectomy, ectopic pregnancy, vaginal hysterectomy.  -General surgery -following recommending continue NG tube for decompression, Reporting that the findings are consistent with postop ileus-anticipating initiation and trial of erythromycin.  Acute renal insufficiency/hyperkalemia -Current BUN/creatinine 83/2.31 (baseline around 1.08) >> 2.65 >> 1.27 -We will continue gentle IV  fluid hydration, -Status post Kayexalate, calcium gluconate if needed for hyper kalemia, potassium 4.5 today  -Monitoring closely -Avoiding nephrotoxins  Hematemesis /with history of anemia of chronic disease. -Improved, monitoring gastric content,  greenish in color, -Gastroccult positive -No reported further emesis by nursing staff and patient. -Continue IV Protonix -Monitoring H&H, hemoglobin  9.8 >>9.5 >>8.1 today   -Home medication of apixaban on hold, -Anticipating if patient remained stable next few days to restart heparin or apixaban  H/o  Atrial fibrillation  -with complete heart block, with history of pacemaker in place -On digoxin, currently on hold apixaban on hold -ruling out overt GI bleed at this time  History of coronary artery disease -May stable denies any chest pain  Hypertension -Currently n.p.o. -Blood pressure remained stable, monitoring -On IV hydralazine  Acute on chronic anemia with possible blood loss anemia -Controlled H&H, currently hemoglobin stable around 9.2  Status post recent hip surgery -Remained stable  History of rectal cancer  -status post resection follow CT scan.  DVT prophylaxis: SCDs. (Eliquis on hold -due to positive Gastroccult) Code Status: Full code. Family Communication: Discussed current patient situation with her son and daughter Ms. Brooks Sailors Disposition Plan: To be determined. Consults called: General surgery. Admission status: Inpatient  Please update patient's daughter Ms. Brooks Sailors at [phone]   CT abdomen pelvis  IMPRESSION: Changes consistent with small-bowel obstruction in the right deep pelvis likely related to adhesions as no mass lesion is seen. This appears to lie in the proximal aspect of the ileum. Follow-up films are recommended. Sliding-type hiatal hernia. Recent postoperative changes in the proximal right hip. Stable renal cysts and bibasilar lung scarring.  Electronically Signed   By: Inez Catalina M.D.   On: 02/22/2019 08:39     Procedures:   No admission procedures for hospital encounter.   NGT  Antimicrobials:  Anti-infectives (From admission, onward)   Start     Dose/Rate Route Frequency Ordered Stop   02/22/19  1230  erythromycin 250 mg in sodium chloride 0.9 % 100 mL IVPB     250 mg 100 mL/hr over 60 Minutes Intravenous Every 6 hours 02/22/19 1130         Medication:  . Chlorhexidine Gluconate Cloth  6 each Topical Daily  . pantoprazole (PROTONIX) IV  40 mg Intravenous Q12H    acetaminophen **OR** acetaminophen, albuterol, hydrALAZINE, ondansetron **OR** ondansetron (ZOFRAN) IV   Objective:   Intake/Output Summary (Last 24 hours) at 02/22/2019 1246 Last data filed at 02/22/2019 1000 Gross per 24 hour  Intake 1810 ml  Output 750 ml  Net 1060 ml   Filed Weights   03/09/2019 0500 02/21/19 0703 02/22/19 0500  Weight: 61.3 kg 58.3 kg 60 kg     Examination:  BP (!) 119/48 (BP Location: Right Arm)   Pulse 62   Temp 98.7 F (37.1 C) (Oral)   Resp 20   Ht 5\' 7"  (1.702 m)   Wt 60 kg   SpO2 100%   BMI 20.72 kg/m    Physical Exam  Constitution:  Alert, cooperative, no distress,  Psychiatric: Normal and stable mood and affect, cognition intact,   HEENT: Normocephalic, PERRL, otherwise with in Normal limits  Chest:Chest symmetric Cardio vascular:  S1/S2, RRR, No murmure, No Rubs or Gallops  pulmonary: Clear to auscultation bilaterally, respirations unlabored, negative wheezes / crackles Abdomen: Soft, mild diffuse tenderness, improved distention, hypoactive bowel sounds,no masses, no organomegaly Muscular skeletal: Limited exam - in bed, able to move all 4 extremities, Normal  strength,  Neuro: CNII-XII intact. , normal motor and sensation, reflexes intact  Extremities: No pitting edema lower extremities, +2 pulses  Skin: Dry, warm to touch, negative for any Rashes, No open wounds Wounds: per nursing documentation     LABs:  CBC Latest Ref Rng & Units 02/22/2019 03/03/2019 02/21/2019  WBC 4.0 - 10.5 K/uL 10.9(H) 5.5 7.0  Hemoglobin 12.0 - 15.0 g/dL 8.1(L) 9.5(L) 9.2(L)  Hematocrit 36.0 - 46.0 % 25.8(L) 29.2(L) 28.2(L)  Platelets 150 - 400 K/uL 268 308 301   CMP Latest Ref Rng &  Units 02/22/2019 02/21/2019 02/21/2019  Glucose 70 - 99 mg/dL 138(H) 147(H) 194(H)  BUN 8 - 23 mg/dL 72(H) 106(H) 83(H)  Creatinine 0.44 - 1.00 mg/dL 1.27(H) 2.65(H) 2.31(H)  Sodium 135 - 145 mmol/L 143 137 137  Potassium 3.5 - 5.1 mmol/L 3.4(L) 4.5 5.3(H)  Chloride 98 - 111 mmol/L 100 92(L) 90(L)  CO2 22 - 32 mmol/L 30 32 29  Calcium 8.9 - 10.3 mg/dL 7.3(L) 7.4(L) 8.7(L)  Total Protein 6.5 - 8.1 g/dL 5.7(L) - 7.1  Total Bilirubin 0.3 - 1.2 mg/dL 1.8(H) - 1.5(H)  Alkaline Phos 38 - 126 U/L 47 - 48  AST 15 - 41 U/L 36 - 25  ALT 0 - 44 U/L 12 - 7        SIGNED: Deatra James, MD, FACP, FHM. [facility],  Pager [phone]  If 7PM-7AM, please contact night-coverage [email or web address] 02/22/2019, 12:46 PM

## 2019-02-22 NOTE — Progress Notes (Signed)
PA made aware of abdominal xray result, verbal order received to replace NGT, patient did not feel good about removing and replacing NGT, but agreed to advancing NGT, NGT advanced further through the nose, more amount of thick greenish drainage suctioned out of the NGT tube,no sign of distress noted, procedure well tolerated  will get another Xray and continue to monitor.

## 2019-02-22 NOTE — Progress Notes (Signed)
Daily Progress Note   Patient Name: Hannah Mercado       Date: 02/22/2019 DOB: 02-02-1923  Age: 85 y.o. MRN#: [id number] Attending Physician: Deatra James, MD Primary Care Physician: Patient, No Pcp Per Admit Date: 02/27/2019  Reason for Consultation/Follow-up: Establishing goals of care  Subjective: Met with patient with son, [name] at bedside. [name] denies pain- only complains of discomfort with NG tube needing to be moved. No abdominal pain or nausea.  [name] reviewed his previous discussion with MD regarding concerns about patient's kidney function. All agreed that patient wishes for continued medical care in hopes of being able to discharge to CIR and eventually home- however, if she declined further- she would not want aggressive measures including dialysis, CPR, intubation.  I also spoke with patient's daughter- [name]- updated her and answered her questions regarding plan of care and Goals. Cheryl agreed.  We did discuss concerns that in frail elderly patients- often a hip fracture is the beginning of progressing decline towards end of life. [name] was very realistic and stated all family is "hoping for the best, but prepared for the worst" outcomes.   Review of Systems  Gastrointestinal: Negative for abdominal pain and nausea.    Length of Stay: 2  Current Medications: Scheduled Meds:  . Chlorhexidine Gluconate Cloth  6 each Topical Daily  . pantoprazole (PROTONIX) IV  40 mg Intravenous Q12H    Continuous Infusions: . dextrose 5 % and 0.45% NaCl 75 mL/hr at 02/22/19 0830  . erythromycin 250 mg (02/22/19 1355)    PRN Meds: acetaminophen **OR** acetaminophen, albuterol, hydrALAZINE, ondansetron **OR** ondansetron (ZOFRAN) IV  Physical Exam Vitals signs and nursing  note reviewed.  Constitutional:      Comments: frail  Skin:    Coloration: Skin is pale.  Neurological:     Mental Status: She is alert and oriented to person, place, and time.  Psychiatric:        Mood and Affect: Mood normal.        Behavior: Behavior normal.        Thought Content: Thought content normal.        Judgment: Judgment normal.             Vital Signs: BP (!) 129/96   Pulse 63   Temp 97.8 F (36.6 C) (Oral)  Resp (!) 22   Ht 5' 7"  (1.702 m)   Wt 60 kg   SpO2 99%   BMI 20.72 kg/m  SpO2: SpO2: 99 % O2 Device: O2 Device: Nasal Cannula O2 Flow Rate: O2 Flow Rate (L/min): 1 L/min  Intake/output summary:   Intake/Output Summary (Last 24 hours) at 02/22/2019 1736 Last data filed at 02/22/2019 1700 Gross per 24 hour  Intake 2164.98 ml  Output 1250 ml  Net 914.98 ml   LBM: Last BM Date: 02/15/19 Baseline Weight: Weight: 61.3 kg Most recent weight: Weight: 60 kg       Palliative Assessment/Data: PPS: 10%      Patient Active Problem List   Diagnosis Date Noted  . Palliative care by specialist   . Goals of care, counseling/discussion   . Small bowel obstruction ([place]) 03/11/2019  . Vomiting 02/22/2019  . ARF (acute renal failure) ([place]) 03/03/2019  . SBO (small bowel obstruction) ([place]) 03/16/2019  . Hip fracture ([place]) 02/19/2019  . Protein-calorie malnutrition, severe 02/16/2019  . Closed hip fracture, right, initial encounter ([place]) 02/15/2019  . Stage 3a chronic kidney disease 02/15/2019  . Back pain 02/21/2018  . Compression fracture of lumbar vertebra ([place]) 02/21/2018  . Osteoporosis 02/21/2018  . Pulmonary hypertension ([place]) 02/20/2018  . Cardiac pacemaker in situ 02/24/2016  . Persistent atrial fibrillation ([place])   . Chronic venous insufficiency 09/11/2015  . Edema 09/11/2015  . Carotid artery disease ([place]) 03/12/2015  . Diverticulosis of colon without hemorrhage 08/02/2012  . UTI (lower urinary tract infection) 02/03/2012  . Hypertension  04/14/2011  . Ovarian cyst, left 12/29/2010  . pT2pN0 (0/12 lymph nodes) cM0 Rectal Cancer 11/27/2010  . Mononeuritis 07/31/2008  . PULMONARY FIBROSIS, POSTINFLAMMATORY 07/31/2008  . Diaphragmatic hernia 07/31/2008  . Mitral valve disorder 04/20/2007  . HYPERCHOLESTEROLEMIA 04/19/2007  . Anxiety state 04/19/2007  . GERD 04/19/2007  . Osteoarthritis 04/19/2007  . ALLERGY 04/19/2007    Palliative Care Assessment & Plan   Patient Profile: 85 y.o. female  with past medical history of rectal cancer, a fib, a fib with pacemaker, HTN, HLD, chronic renal insufficiency, pulmonary fibrosis, hip fracture- was recovering in CIR- admitted on 02/27/2019 with nausea and vomiting- workup reveals small bowel obstruction- likely illeus from recent hip surgery. Has NG placed. Palliative medicine team consulted for [place] and code status discussion.   Assessment/Recommendations/Plan   DNR  Continue current level of care- limits set at DNR, no dialysis  GOC- hopeful for d/c to CIR and eventually home  PMT will monitor for progress/decline and revisit GOC if needed  Recommend continued f/u from Palliative when patient is discharged home  Goals of Care and Additional Recommendations:  Limitations on Scope of Treatment: No Hemodialysis  Code Status:  DNR  Prognosis:   Unable to determine  Discharge Planning:  CIR  Care plan was discussed with patient, son- [name]; daughter- [name]  Thank you for allowing the Palliative Medicine Team to assist in the care of this patient.   Time In: 1600 Time Out: 1700 Total Time 60 mins Prolonged Time Billed yes      Greater than 50%  of this time was spent counseling and coordinating care related to the above assessment and plan.  Mariana Kaufman, AGNP-C Palliative Medicine   Please contact Palliative Medicine Team phone at [phone] for questions and concerns.

## 2019-02-22 NOTE — Progress Notes (Signed)
[facility] Progress Note     Subjective: CC-  Feeling well this morning. Denies abdominal bloating or pain. States that she has started passing some flatus, no BM. She is also burping some. NG without 0 output recorded last 24 hours, but does not seem to be working. States that she got up to the side of the bed yesterday.  Objective: Vital signs in last 24 hours: Temp:  [98 F (36.7 C)-99.7 F (37.6 C)] 98.7 F (37.1 C) (12/03 0729) Pulse Rate:  [60-63] 62 (12/03 0729) Resp:  [18-25] 20 (12/03 0729) BP: (110-125)/(46-51) 119/48 (12/03 0729) SpO2:  [97 %-100 %] 100 % (12/03 0729) Weight:  [60 kg] 60 kg (12/03 0500) Last BM Date: 02/15/19  Intake/Output from previous day: 12/02 0701 - 12/03 0700 In: 1810 [I.V.:1110; NG/GT:50] Out: 1050 [Urine:1050] Intake/Output this shift: No intake/output data recorded.  PE: Gen:  Alert, NAD, pleasant Pulm:  Rate and effort normal Abd: Soft, distended, nontender, +BS, no HSM Neuro: moving all 4 extremities Skin: warm and dry  Lab Results:  Recent Labs    03/10/2019 0755 03/01/2019 1221  WBC 7.0 5.5  HGB 9.2* 9.5*  HCT 28.2* 29.2*  PLT 301 308   BMET Recent Labs    03/22/2019 0154 02/21/19 0301  NA 137 137  K 5.3* 4.5  CL 90* 92*  CO2 29 32  GLUCOSE 194* 147*  BUN 83* 106*  CREATININE 2.31* 2.65*  CALCIUM 8.7* 7.4*   PT/INR No results for input(s): LABPROT, INR in the last 72 hours. CMP     Component Value Date/Time   NA 137 02/21/2019 0301   K 4.5 02/21/2019 0301   CL 92 (L) 02/21/2019 0301   CO2 32 02/21/2019 0301   GLUCOSE 147 (H) 02/21/2019 0301   BUN 106 (H) 02/21/2019 0301   CREATININE 2.65 (H) 02/21/2019 0301   CREATININE 1.08 (H) 10/09/2015 1130   CALCIUM 7.4 (L) 02/21/2019 0301   PROT 7.1 03/14/2019 0154   ALBUMIN 3.1 (L) 03/05/2019 0154   AST 25 03/14/2019 0154   ALT 7 03/08/2019 0154   ALKPHOS 48 02/25/2019 0154   BILITOT 1.5 (H) 02/24/2019 0154   GFRNONAA 15 (L) 02/21/2019 0301   GFRAA 17 (L) 02/21/2019 0301   Lipase  No results found for: LIPASE     Studies/Results: Dg Abd 1 View  Result Date: 03/13/2019 CLINICAL DATA:  Gastric catheter replacement EXAM: ABDOMEN - 1 VIEW COMPARISON:  None. FINDINGS: Gastric catheter has been replaced and now lies with the tip within the herniated portion of the stomach in the chest cavity. This could be advanced further into the stomach as the proximal side port lies within the distal esophagus. IMPRESSION: Gastric catheter as described. This could be advanced further into the stomach. Electronically Signed   By: Inez Catalina M.D.   On: 03/13/2019 11:13   Dg Abd 1 View  Result Date: 03/01/2019 CLINICAL DATA:  Check nasogastric catheter placement EXAM: ABDOMEN - 1 VIEW COMPARISON:  None. FINDINGS: Gastric catheter is coiled upon itself within the mid esophagus. This should be withdrawn totally and readvanced. IMPRESSION: Gastric catheter coiled upon itself within the chest. Electronically Signed   By: Inez Catalina M.D.   On: 03/04/2019 11:11    Anti-infectives: Anti-infectives (From admission, onward)   None       Assessment/Plan HTN CAD Pacemaker in place Pulmonary fibrosis/pulmonary HTN CKD A fib on eliquis (last dose 02/18/19) - please hold eliquis, ok for heparin gtt Right intertrochanteric femur  fracture s/p IMN 02/17/19 by Dr. Lyla Glassing Hiatal hernia AKI  SBO vs Ileus - more likely ileus related to recent orthopedic surgery - NG tube flushed and is working again. Continue NG tube to LIWS. Mobilize. Repeat film in AM.  ID -none VTE -SCDs, eliquis on hold/ ok for heparin gtt FEN -IVF, NPO/NGT to LIWS Foley -placed 12/1 for retention Follow up -TBD   LOS: 2 days    Wellington [place], [facility] 02/22/2019, 8:30 AM Please see Amion for pager number during day hours 7:00am-4:30pm

## 2019-02-23 ENCOUNTER — Inpatient Hospital Stay (HOSPITAL_COMMUNITY): Payer: Medicare Other

## 2019-02-23 LAB — GLUCOSE, CAPILLARY
Glucose-Capillary: 123 mg/dL — ABNORMAL HIGH (ref 70–99)
Glucose-Capillary: 140 mg/dL — ABNORMAL HIGH (ref 70–99)
Glucose-Capillary: 150 mg/dL — ABNORMAL HIGH (ref 70–99)
Glucose-Capillary: 151 mg/dL — ABNORMAL HIGH (ref 70–99)
Glucose-Capillary: 153 mg/dL — ABNORMAL HIGH (ref 70–99)
Glucose-Capillary: 156 mg/dL — ABNORMAL HIGH (ref 70–99)

## 2019-02-23 LAB — CBC
HCT: 25 % — ABNORMAL LOW (ref 36.0–46.0)
Hemoglobin: 7.9 g/dL — ABNORMAL LOW (ref 12.0–15.0)
MCH: 34.1 pg — ABNORMAL HIGH (ref 26.0–34.0)
MCHC: 31.6 g/dL (ref 30.0–36.0)
MCV: 107.8 fL — ABNORMAL HIGH (ref 80.0–100.0)
Platelets: 271 10*3/uL (ref 150–400)
RBC: 2.32 MIL/uL — ABNORMAL LOW (ref 3.87–5.11)
RDW: 14.6 % (ref 11.5–15.5)
WBC: 12 10*3/uL — ABNORMAL HIGH (ref 4.0–10.5)
nRBC: 0.5 % — ABNORMAL HIGH (ref 0.0–0.2)

## 2019-02-23 LAB — BASIC METABOLIC PANEL
Anion gap: 11 (ref 5–15)
BUN: 62 mg/dL — ABNORMAL HIGH (ref 8–23)
CO2: 30 mmol/L (ref 22–32)
Calcium: 7.3 mg/dL — ABNORMAL LOW (ref 8.9–10.3)
Chloride: 103 mmol/L (ref 98–111)
Creatinine, Ser: 1.08 mg/dL — ABNORMAL HIGH (ref 0.44–1.00)
GFR calc Af Amer: 51 mL/min — ABNORMAL LOW (ref >60)
GFR calc non Af Amer: 44 mL/min — ABNORMAL LOW (ref >60)
Glucose, Bld: 158 mg/dL — ABNORMAL HIGH (ref 70–99)
Potassium: 3.2 mmol/L — ABNORMAL LOW (ref 3.5–5.1)
Sodium: 144 mmol/L (ref 135–145)

## 2019-02-23 MED ORDER — KCL IN DEXTROSE-NACL 20-5-0.45 MEQ/L-%-% IV SOLN
INTRAVENOUS | Status: DC
  Administered 2019-02-23 – 2019-02-26 (×5): via INTRAVENOUS
  Filled 2019-02-23 (×6): qty 1000

## 2019-02-23 MED ORDER — DIATRIZOATE MEGLUMINE & SODIUM 66-10 % PO SOLN
90.0000 mL | Freq: Once | ORAL | Status: AC
  Administered 2019-02-23: 90 mL via NASOGASTRIC
  Filled 2019-02-23: qty 90

## 2019-02-23 NOTE — Progress Notes (Signed)
Physical Therapy Treatment Patient Details Name: Hannah Mercado MRN: [id number] DOB: 04-Jul-1922 Today's Date: 02/23/2019    History of Present Illness 85yo female s/p R hip IM nail placement 02/17/19, was transferred to CIR then had 676mL coffee ground emesis. Imaging indicates SOB. Transferred back to acute care due to new SBO and ARF. PMH A-fib, s/p PPM, CAD, HLD, HTN, mitral valve disorder, pulmonary fibrosis, rectal CA, colon resection, peripheral neuropathy    PT Comments    On arrival to room pt reports it has been a busy day and she is very weary. She would like to sit up in the chair but doesn't feel up for walking. Pt was able to progress OOB to recliner chair with mod A +2. During transfer pt's knees were buckling requiring mod A +2 to remain upright. RN advised stedy may be ideal for transfer back to bed. Pt with c/o dizziness that worsened on rising and improved once seated in recliner chair. Will continue to follow acutely for mobility progression.    Follow Up Recommendations  CIR     Equipment Recommendations  Rolling walker with 5" wheels;3in1 (PT)    Recommendations for Other Services Rehab consult     Precautions / Restrictions Precautions Precautions: Fall Precaution Comments: NG tube, watch O2 sats, WBAT R LE, knees buckle Restrictions Weight Bearing Restrictions: Yes RLE Weight Bearing: Weight bearing as tolerated    Mobility  Bed Mobility Overal bed mobility: Needs Assistance Bed Mobility: Sit to Supine     Supine to sit: Min assist     General bed mobility comments: Increased time and effort with use of bed rails. Min A to progress LE off EOB and to elevate trunk.  Transfers Overall transfer level: Needs assistance Equipment used: Rolling walker (2 wheeled) Transfers: Sit to/from Stand Sit to Stand: Min assist;+2 safety/equipment         General transfer comment: Pt with buckling and unable to fully extend. Cues for hand placement on  RW and postural control  Ambulation/Gait Ambulation/Gait assistance: Mod assist;+2 physical assistance Gait Distance (Feet): 4 Feet Assistive device: Rolling walker (2 wheeled) Gait Pattern/deviations: Step-to pattern;Shuffle;Trunk flexed Gait velocity: reduced Gait velocity interpretation: <1.31 ft/sec, indicative of household ambulator General Gait Details: Pt with knees buckling and trunk flexed for few steps to recliner chair. VC for safety and physical assist required for pt to remain upright   Stairs             Wheelchair Mobility    Modified Rankin (Stroke Patients Only)       Balance Overall balance assessment: Needs assistance Sitting-balance support: Feet supported;Bilateral upper extremity supported Sitting balance-Leahy Scale: Fair Sitting balance - Comments: Pt dizzy on rising into seated position. Reaching for support from therapist and tech.   Standing balance support: Bilateral upper extremity supported;During functional activity Standing balance-Leahy Scale: Poor Standing balance comment: reliant on RW and additional external support                            Cognition Arousal/Alertness: Awake/alert Behavior During Therapy: WFL for tasks assessed/performed Overall Cognitive Status: Within Functional Limits for tasks assessed                                 General Comments: very pleasant and cooperative with therapy, good awareness of deficits and understanding of safety principles with PT  Exercises General Exercises - Lower Extremity Long Arc Quad: AROM;Both;10 reps    General Comments        Pertinent Vitals/Pain Pain Assessment: Faces Faces Pain Scale: Hurts little more Pain Location: R LE with movement Pain Descriptors / Indicators: Grimacing;Discomfort Pain Intervention(s): Monitored during session;Limited activity within patient's tolerance;Repositioned    Home Living                       Prior Function            PT Goals (current goals can now be found in the care plan section) Acute Rehab PT Goals Patient Stated Goal: get better and go to rehab PT Goal Formulation: With patient Time For Goal Achievement: 03/14/2019 Potential to Achieve Goals: Good Progress towards PT goals: Progressing toward goals    Frequency    Min 4X/week      PT Plan Current plan remains appropriate    Co-evaluation              AM-PAC PT "6 Clicks" Mobility   Outcome Measure  Help needed turning from your back to your side while in a flat bed without using bedrails?: A Little Help needed moving from lying on your back to sitting on the side of a flat bed without using bedrails?: A Little Help needed moving to and from a bed to a chair (including a wheelchair)?: A Lot Help needed standing up from a chair using your arms (e.g., wheelchair or bedside chair)?: A Lot Help needed to walk in hospital room?: A Lot Help needed climbing 3-5 steps with a railing? : Total 6 Click Score: 13    End of Session Equipment Utilized During Treatment: Gait belt(none) Activity Tolerance: Patient limited by lethargy;Patient tolerated treatment well Patient left: in chair;with call bell/phone within reach;with chair alarm set Nurse Communication: Mobility status;Need for lift equipment(may need stedy for return to bed.) PT Visit Diagnosis: Difficulty in walking, not elsewhere classified (R26.2);Muscle weakness (generalized) (M62.81);History of falling (Z91.81);Unsteadiness on feet (R26.81)     Time: 8676-1950 PT Time Calculation (min) (ACUTE ONLY): 18 min  Charges:  $Therapeutic Activity: 8-22 mins                    Benjiman Core, [place] Pager [phone] Acute Rehab   Allena Katz 02/23/2019, 2:08 PM

## 2019-02-23 NOTE — Progress Notes (Signed)
    CC: Transferred from rehab with coffee-ground emesis/SBO  Subjective: She says she has some flatus, no BM.  Her abdomen is still distended.  I think her NG tube is in her stomach but perhaps above the diaphragm.  She has 500 in the NG canister this AM.  Flush to and replaced the sump filter.  It is working, and continues to produce green-colored fluid.  Objective: Vital signs in last 24 hours: Temp:  [97.8 F (36.6 C)-98.9 F (37.2 C)] 98.9 F (37.2 C) (12/04 0552) Pulse Rate:  [63-66] 63 (12/04 0552) Resp:  [18-22] 18 (12/04 0552) BP: (113-129)/(47-96) 121/51 (12/04 0552) SpO2:  [98 %-99 %] 98 % (12/04 0552) Weight:  [59.2 kg] 59.2 kg (12/04 0500) Last BM Date: 02/15/19 60 p.o. 1600 IV 1600 urine NG 300 Afebrile vital signs are stable O2 sats 9900% on 1 L nasal cannula A.m. labs shows potassium of 3.2 Creatinine improving at 1.08 WBC 12.0 H/H 7.9/25 A.m. film shows tip of the side-port of the NG tube within the stomach.  There is a large hiatal hernia is unclear whether the NG tube is in the intrathoracic or intra-abdominal portion of the stomach.  Intake/Output from previous day: 12/03 0701 - 12/04 0700 In: 1665.8 [P.O.:60; I.V.:1385.8; NG/GT:120; IV Piggyback:100] Out: 36 [Urine:1600; Emesis/NG output:300] Intake/Output this shift: No intake/output data recorded.  General appearance: alert, cooperative and no distress Resp: clear to auscultation bilaterally and Anterior exam GI: Abdomen still distended.  She does have a few bowel sounds and they are high-pitched she reports some flatus no BM.  Lab Results:  Recent Labs    02/22/19 0945 02/23/19 0204  WBC 10.9* 12.0*  HGB 8.1* 7.9*  HCT 25.8* 25.0*  PLT 268 271    BMET Recent Labs    02/22/19 1121 02/23/19 0204  NA 143 144  K 3.4* 3.2*  CL 100 103  CO2 30 30  GLUCOSE 138* 158*  BUN 72* 62*  CREATININE 1.27* 1.08*  CALCIUM 7.3* 7.3*   PT/INR No results for input(s): LABPROT, INR in the last  72 hours.  Recent Labs  Lab 02/28/2019 0154 02/22/19 1121  AST 25 36  ALT 7 12  ALKPHOS 48 47  BILITOT 1.5* 1.8*  PROT 7.1 5.7*  ALBUMIN 3.1* 2.5*     Lipase  No results found for: LIPASE   Medications: . Chlorhexidine Gluconate Cloth  6 each Topical Daily  . pantoprazole (PROTONIX) IV  40 mg Intravenous Q12H   . dextrose 5 % and 0.45 % NaCl with KCl 20 mEq/L    . erythromycin 250 mg (02/23/19 0606)   Assessment/Plan HTN CAD Pacemaker in place Pulmonary fibrosis/pulmonary HTN CKD A fib on eliquis (last dose 02/18/19) - please hold eliquis, ok for heparin gtt Right intertrochanteric femur fracture s/p IMN 02/17/19 by Dr. Lyla Glassing Hiatal hernia AKI  SBO vs Ileus -more likely ileus related to recent orthopedic surgery - NG tube flushed and is working again. Continue NG tube to LIWS. Mobilize. Repeat film in AM.  - Started on erythromycin 250 mg IV every 6 for motility 12/3 >> day 2  ID -none VTE -SCDs, eliquison hold/ok for heparin gtt FEN -IVF, NPO/NGT to LIWS Foley -placed 12/1 for retention Follow up -TBD   Plan: We will review with Dr. Rosendo Gros and discuss small bowel protocol. Continue NG suction for now.   LOS: 3 days    Darnice Comrie 02/23/2019 Please see Amion

## 2019-02-23 NOTE — Progress Notes (Signed)
PROGRESS NOTE    Patient: Hannah Mercado                            PCP: Patient, No Pcp Per                    DOB: 11/26/1922            DOA: 03/14/2019 RCV:[id number]             DOS: 02/23/2019, 12:08 PM   LOS: 3 days   Date of Service: The patient was seen and examined on 02/23/2019  Subjective:   The patient was seen and examined this morning, remained stable awake alert pleasant. Reporting of gas but no bowel movement. NG tube is still in place. No issues overnight.  Brief Narrative:   Hannah Mercado  is a pleasant 85 year old female with past medical history of HTN, CAD, pacemaker, pulmonary fibrosis/pulmonary, CKD, A-fib on eliquis (last dose 02/18/19), and recent right intertrochanteric femur fracture s/p IMN 02/17/19 by Dr. Lyla Glassing, who was discharged from the hospital yesterday to inpatient rehab.  Presented with pain, distention, nausea vomiting.  . Patient reported that over the last 2 days she has had worsening abdominal pain and decreased appetite. She feels bloated,with nausea over night and had multiple bouts of emesis.  Abdominal xray followed by CT scan were obtained. This reports significantly dilated stomach, dilatation of the duodenum and jejunum with transition point in the right hemipelvis; suspect SBO related to adhesions.   Patient seems to have a history of prior abdominal surgeries including microscopic oophorectomy, anterior resection on 02/08/2011 by Dr. Barry Dienes for rectal cancer, also history of appendectomy, ectopic pregnancy, vaginal hysterectomy.   03/21/2019 - PTA -600 mL of coffee-ground vomitus -remained stable continue to complain of abdominal pain distention, nausea and vomiting. - general surgery following Remains n.p.o. NG tube was placed-discussed daughter in detail  02/21/2019 NG tube remains in place, abdomen soft distended with tenderness. Hemodynamically stable -noted of elevated creatinine from baseline 2.6 Hemoglobin stable  at 9.5  02/22/2019  -Patient remained stable, NG tube still in place.  H&H stable, Eliquis and on hold  02/23/2019 -Patient reporting of some gas but no bowel movements yet, positive bowel sounds but hypoactive, NG tube still in place -General surgery following... On IV erythromycin   Assessment & Plan:   Principal Problem:   Small bowel obstruction (HCC) Active Problems:   Hypertension   Persistent atrial fibrillation (HCC)   Pulmonary hypertension (HCC)   Protein-calorie malnutrition, severe   Vomiting   ARF (acute renal failure) (HCC)   SBO (small bowel obstruction) ([place])   Palliative care by specialist   Goals of care, counseling/discussion   Advanced care planning/counseling discussion   Acute renal failure superimposed on chronic kidney disease ([place])   Small bowel obstruction -NG tube still in place, greenish gastric content noted -Positive gas, hypoactive bowel sounds at present, no BMs recorded -Abdomen remains distended, soft,  -CT abdomen pelvis -reviewed confirming SBO on the right -Remain n.p.o. -Continue gentle IV fluid hydration (Discussed with patient and family if no improvement in next 1 to 2 days, TPN may be considered)   History of prior abdominal surgeries including microscopic oophorectomy, anterior resection on 02/08/2011 by Dr. Barry Dienes for rectal cancer, also history of appendectomy, ectopic pregnancy, vaginal hysterectomy.  -General surgery -following recommending continue NG tube for decompression, Reporting that the findings are consistent with postop ileus -On IV  Romycin per surgery recommendation  Acute renal insufficiency/hyperkalemia -Current BUN/creatinine 83/2.31 (baseline around 1.08) >> 2.65 >> 1.27 >> 1.08 -We will continue gentle IV fluid hydration,  Hyperkalemia -Resolved -Status post Kayexalate, calcium gluconate if needed for hyper kalemia,   -Monitoring closely -Avoiding nephrotoxins  Hematemesis /with history of anemia of  chronic disease. -Improved, monitoring gastric content, greenish in color, -Gastroccult positive -No reported further emesis by nursing staff and patient. -Continue IV Protonix -Monitoring H&H, hemoglobin  9.8 >>9.5 >>8.1 >> 7.9today   -Home medication of apixaban on hold, -Anticipating if patient remained stable next few days to restart heparin or apixaban  H/o  Atrial fibrillation  -with complete heart block, with history of pacemaker in place -On digoxin, currently on hold apixaban on hold -ruling out overt GI bleed at this time  History of coronary artery disease -May stable denies any chest pain  Hypertension -Currently n.p.o. -Blood pressure remained stable, monitoring -On IV hydralazine  Acute on chronic anemia with possible blood loss anemia -Controlled H&H, currently hemoglobin stable around 9.2  Status post recent hip surgery -Remained stable  History of rectal cancer  -status post resection follow CT scan.  DVT prophylaxis: SCDs. (Eliquis on hold -due to positive Gastroccult) Code Status: Full code. Family Communication:  Discussed with the patient's son and daughter in detail regarding current care, positive gastrocolic, declining hemoglobin, therefore holding Eliquis... Emphasized the patient remains frail.. They both expressed understanding and agreement with current plan.    Disposition Plan: To be determined. Consults called: General surgery. Admission status: Inpatient  Please update patient's daughter Ms. Brooks Sailors at [phone]   CT abdomen pelvis  IMPRESSION: Changes consistent with small-bowel obstruction in the right deep pelvis likely related to adhesions as no mass lesion is seen. This appears to lie in the proximal aspect of the ileum. Follow-up films are recommended. Sliding-type hiatal hernia. Recent postoperative changes in the proximal right hip. Stable renal cysts and bibasilar lung scarring.  Electronically Signed    By: Inez Catalina M.D.   On: 02/25/2019 08:39     Procedures:   No admission procedures for hospital encounter.   NGT  Antimicrobials:  Anti-infectives (From admission, onward)   Start     Dose/Rate Route Frequency Ordered Stop   02/22/19 1230  erythromycin 250 mg in sodium chloride 0.9 % 100 mL IVPB     250 mg 100 mL/hr over 60 Minutes Intravenous Every 6 hours 02/22/19 1130         Medication:  . Chlorhexidine Gluconate Cloth  6 each Topical Daily  . diatrizoate meglumine-sodium  90 mL Per NG tube Once  . pantoprazole (PROTONIX) IV  40 mg Intravenous Q12H    acetaminophen **OR** acetaminophen, albuterol, hydrALAZINE, ondansetron **OR** ondansetron (ZOFRAN) IV   Objective:    Intake/Output Summary (Last 24 hours) at 02/23/2019 1208 Last data filed at 02/23/2019 0603 Gross per 24 hour  Intake 1645.84 ml  Output 1350 ml  Net 295.84 ml   Filed Weights   02/21/19 0703 02/22/19 0500 02/23/19 0500  Weight: 58.3 kg 60 kg 59.2 kg     Examination:  BP (!) 121/51 (BP Location: Left Arm)   Pulse 63   Temp 98.9 F (37.2 C) (Oral)   Resp 18   Ht 5\' 7"  (1.702 m)   Wt 59.2 kg   SpO2 98%   BMI 20.44 kg/m    Physical Exam  Constitution:  Alert, cooperative, no distress,  Psychiatric: Normal and stable mood and affect, cognition  intact,   HEENT: Normocephalic, PERRL, otherwise with in Normal limits  Chest:Chest symmetric Cardio vascular:  S1/S2, RRR, No murmure, No Rubs or Gallops  pulmonary: Clear to auscultation bilaterally, respirations unlabored, negative wheezes / crackles Abdomen: Distended, soft, tenderness with deep palpation, positive but hypoactive bowel sounds, no masses, no organomegaly Muscular skeletal: Limited exam - in bed, able to move all 4 extremities, Normal strength,  Neuro: CNII-XII intact. , normal motor and sensation, reflexes intact  Extremities: No pitting edema lower extremities, +2 pulses  Skin: Dry, warm to touch, negative for any  Rashes, No open wounds Wounds: per nursing documentation      LABs:  CBC Latest Ref Rng & Units 02/23/2019 02/22/2019 02/25/2019  WBC 4.0 - 10.5 K/uL 12.0(H) 10.9(H) 5.5  Hemoglobin 12.0 - 15.0 g/dL 7.9(L) 8.1(L) 9.5(L)  Hematocrit 36.0 - 46.0 % 25.0(L) 25.8(L) 29.2(L)  Platelets 150 - 400 K/uL 271 268 308   CMP Latest Ref Rng & Units 02/23/2019 02/22/2019 02/21/2019  Glucose 70 - 99 mg/dL 158(H) 138(H) 147(H)  BUN 8 - 23 mg/dL 62(H) 72(H) 106(H)  Creatinine 0.44 - 1.00 mg/dL 1.08(H) 1.27(H) 2.65(H)  Sodium 135 - 145 mmol/L 144 143 137  Potassium 3.5 - 5.1 mmol/L 3.2(L) 3.4(L) 4.5  Chloride 98 - 111 mmol/L 103 100 92(L)  CO2 22 - 32 mmol/L 30 30 32  Calcium 8.9 - 10.3 mg/dL 7.3(L) 7.3(L) 7.4(L)  Total Protein 6.5 - 8.1 g/dL - 5.7(L) -  Total Bilirubin 0.3 - 1.2 mg/dL - 1.8(H) -  Alkaline Phos 38 - 126 U/L - 47 -  AST 15 - 41 U/L - 36 -  ALT 0 - 44 U/L - 12 -        SIGNED: Deatra James, MD, FACP, FHM. [facility],  Pager [phone]  If 7PM-7AM, please contact night-coverage [email or web address] [email or web address] 02/23/2019, 12:08 PM

## 2019-02-23 NOTE — Plan of Care (Signed)
  Problem: Education: Goal: Knowledge of General Education information will improve Description: Including pain rating scale, medication(s)/side effects and non-pharmacologic comfort measures Outcome: Progressing   Problem: Clinical Measurements: Goal: Ability to maintain clinical measurements within normal limits will improve Outcome: Progressing   Problem: Pain Managment: Goal: General experience of comfort will improve Outcome: Progressing   

## 2019-02-24 LAB — CBC
HCT: 26.3 % — ABNORMAL LOW (ref 36.0–46.0)
Hemoglobin: 8.1 g/dL — ABNORMAL LOW (ref 12.0–15.0)
MCH: 33.1 pg (ref 26.0–34.0)
MCHC: 30.8 g/dL (ref 30.0–36.0)
MCV: 107.3 fL — ABNORMAL HIGH (ref 80.0–100.0)
Platelets: 301 10*3/uL (ref 150–400)
RBC: 2.45 MIL/uL — ABNORMAL LOW (ref 3.87–5.11)
RDW: 14.5 % (ref 11.5–15.5)
WBC: 16.8 10*3/uL — ABNORMAL HIGH (ref 4.0–10.5)
nRBC: 0.5 % — ABNORMAL HIGH (ref 0.0–0.2)

## 2019-02-24 LAB — GLUCOSE, CAPILLARY
Glucose-Capillary: 130 mg/dL — ABNORMAL HIGH (ref 70–99)
Glucose-Capillary: 101 mg/dL — ABNORMAL HIGH (ref 70–99)

## 2019-02-24 LAB — BASIC METABOLIC PANEL
Anion gap: 10 (ref 5–15)
BUN: 48 mg/dL — ABNORMAL HIGH (ref 8–23)
CO2: 31 mmol/L (ref 22–32)
Calcium: 7.6 mg/dL — ABNORMAL LOW (ref 8.9–10.3)
Chloride: 106 mmol/L (ref 98–111)
Creatinine, Ser: 0.97 mg/dL (ref 0.44–1.00)
GFR calc Af Amer: 58 mL/min — ABNORMAL LOW (ref >60)
GFR calc non Af Amer: 50 mL/min — ABNORMAL LOW (ref >60)
Glucose, Bld: 162 mg/dL — ABNORMAL HIGH (ref 70–99)
Potassium: 3.5 mmol/L (ref 3.5–5.1)
Sodium: 147 mmol/L — ABNORMAL HIGH (ref 135–145)

## 2019-02-24 LAB — MAGNESIUM: Magnesium: 2.7 mg/dL — ABNORMAL HIGH (ref 1.7–2.4)

## 2019-02-24 MED ORDER — MENTHOL 3 MG MT LOZG: 1.0000 | LOZENGE | OROMUCOSAL | Status: DC | PRN

## 2019-02-24 MED ORDER — ACETAMINOPHEN 650 MG RE SUPP
650.0000 mg | Freq: Four times a day (QID) | RECTAL | Status: DC | PRN
  Administered 2019-02-26 – 2019-02-27 (×2): 650 mg via RECTAL
  Filled 2019-02-24 (×2): qty 1

## 2019-02-24 MED ORDER — LACTATED RINGERS IV BOLUS
1000.0000 mL | Freq: Once | INTRAVENOUS | Status: AC
  Administered 2019-02-24: 1000 mL via INTRAVENOUS

## 2019-02-24 MED ORDER — BISACODYL 10 MG RE SUPP
10.0000 mg | Freq: Every day | RECTAL | Status: DC
  Administered 2019-02-24 – 2019-03-02 (×7): 10 mg via RECTAL
  Filled 2019-02-24 (×7): qty 1

## 2019-02-24 MED ORDER — MAGIC MOUTHWASH
15.0000 mL | Freq: Four times a day (QID) | ORAL | Status: DC | PRN
  Filled 2019-02-24: qty 15

## 2019-02-24 MED ORDER — LIP MEDEX EX OINT
1.0000 "application " | TOPICAL_OINTMENT | Freq: Two times a day (BID) | CUTANEOUS | Status: DC
  Administered 2019-02-24 – 2019-03-02 (×11): 1 via TOPICAL
  Filled 2019-02-24: qty 7

## 2019-02-24 MED ORDER — POTASSIUM CHLORIDE 10 MEQ/100ML IV SOLN
10.0000 meq | INTRAVENOUS | Status: AC
  Administered 2019-02-24 (×4): 10 meq via INTRAVENOUS
  Filled 2019-02-24: qty 100

## 2019-02-24 MED ORDER — PHENOL 1.4 % MT LIQD: 2.0000 | OROMUCOSAL | Status: DC | PRN

## 2019-02-24 MED ORDER — SODIUM CHLORIDE 0.9 % IV SOLN
500.0000 mg | Freq: Once | INTRAVENOUS | Status: AC
  Administered 2019-02-24: 500 mg via INTRAVENOUS
  Filled 2019-02-24 (×2): qty 10

## 2019-02-24 MED ORDER — ALUM & MAG HYDROXIDE-SIMETH 200-200-20 MG/5ML PO SUSP: 30.0000 mL | Freq: Four times a day (QID) | ORAL | Status: DC | PRN

## 2019-02-24 MED ORDER — SODIUM CHLORIDE 0.9 % IV SOLN
25.0000 mg | Freq: Once | INTRAVENOUS | Status: AC
  Administered 2019-02-24: 25 mg via INTRAVENOUS
  Filled 2019-02-24: qty 0.5

## 2019-02-24 MED ORDER — BISACODYL 10 MG RE SUPP: 10.0000 mg | Freq: Two times a day (BID) | RECTAL | Status: DC | PRN

## 2019-02-24 NOTE — Plan of Care (Signed)
  Problem: Clinical Measurements: Goal: Ability to maintain clinical measurements within normal limits will improve Outcome: Progressing Goal: Will remain free from infection Outcome: Progressing Goal: Diagnostic test results will improve Outcome: Progressing   Problem: Pain Managment: Goal: General experience of comfort will improve Outcome: Progressing   Problem: Safety: Goal: Ability to remain free from injury will improve Outcome: Progressing   Problem: Skin Integrity: Goal: Risk for impaired skin integrity will decrease Outcome: Progressing   

## 2019-02-24 NOTE — Progress Notes (Addendum)
PROGRESS NOTE    Hannah Mercado  BZJ:[id number] DOB: 1922-05-21 DOA: 02/22/2019 PCP: Patient, No Pcp Per     Brief Narrative:  85 year old woman admitted to the hospital on 02/22/2019 due to abdominal pain, distention, nausea and vomiting.  Her past medical history significant for hypertension, coronary artery disease, chronic kidney disease, atrial fibrillation on Eliquis status post pacemaker, history of pulmonary fibrosis and a recent right intertrochanteric femur fracture status post intramedullary nail on 11/28.  X-ray and CT scan of abdomen were obtained on admission that showed a significantly dilated stomach, duodenum and jejunum with transition point in the right hemipelvis, suspecting SBO related to adhesions.  She was admitted by surgery and we were asked to consult to manage medical issues.   Assessment & Plan:   Principal Problem:   Small bowel obstruction (HCC) Active Problems:   Hypertension   Persistent atrial fibrillation (HCC)   Pulmonary hypertension (HCC)   Protein-calorie malnutrition, severe   Vomiting   ARF (acute renal failure) (HCC)   SBO (small bowel obstruction) ([place])   Palliative care by specialist   Goals of care, counseling/discussion   Advanced care planning/counseling discussion   Acute renal failure superimposed on chronic kidney disease ([place])   Small bowel obstruction/ileus -Followed by surgery, continue NG tube to low intermittent suction, mobilize, plan to repeat abdominal film in a.m. -She is on erythromycin for motility. -Electrolytes are within range today.  Acute renal failure -Likely due to prerenal azotemia. -Resolved with IV fluids.  Hyperkalemia -Did receive Kayexalate this admission. -Potassium is 3.5 today.  Anemia of chronic disease -With acute worsening.  Baseline appears to be in the mid 9 range. -Hemoglobin today is 8.1, no need for transfusion but will follow.  History of atrial fibrillation -Rate controlled,  she is paced. -She is anticoagulated on Eliquis as an outpatient. -Anticoagulation is on hold in the hospital presumably due to anemia and possibility of surgery.  History of coronary artery disease -Stable, no chest pains  History of hypertension -Blood pressure has been well controlled while hospitalized.  History of rectal cancer -Noted  Status post recent hip surgery -Stable   DVT prophylaxis: SCDs Code Status: DNR Family Communication: No family at bedside Disposition Plan: Pending resolution of ileus   Antimicrobials:  Anti-infectives (From admission, onward)   Start     Dose/Rate Route Frequency Ordered Stop   02/22/19 1230  erythromycin 250 mg in sodium chloride 0.9 % 100 mL IVPB     250 mg 100 mL/hr over 60 Minutes Intravenous Every 6 hours 02/22/19 1130         Subjective: She denies abdominal pain today, thinks she may have passed some flatus, finds NG tube uncomfortable.  Objective: Vitals:   02/23/19 0552 02/23/19 1707 02/23/19 2136 02/24/19 0524  BP: (!) 121/51 122/62 (!) 121/50 (!) 124/45  Pulse: 63 63 63 62  Resp: 18 17 18 20   Temp: 98.9 F (37.2 C) 98.5 F (36.9 C) 97.7 F (36.5 C) 98.1 F (36.7 C)  TempSrc: Oral  Oral Oral  SpO2: 98% 96% 100% 95%  Weight:    58.2 kg  Height:        Intake/Output Summary (Last 24 hours) at 02/24/2019 1024 Last data filed at 02/24/2019 0522 Gross per 24 hour  Intake 2229.25 ml  Output 2100 ml  Net 129.25 ml   Filed Weights   02/22/19 0500 02/23/19 0500 02/24/19 0524  Weight: 60 kg 59.2 kg 58.2 kg    Examination:  General  exam: Alert, awake, oriented x 3 Respiratory system: Clear to auscultation. Respiratory effort normal. Cardiovascular system:RRR. No murmurs, rubs, gallops. Gastrointestinal system: Soft, mildly distended, nontender, positive bowel sounds Central nervous system: Alert and oriented. No focal neurological deficits. Extremities: No C/C/E, +pedal pulses Skin: No rashes, lesions or  ulcers Psychiatry: Judgement and insight appear normal. Mood & affect appropriate.     Data Reviewed: I have personally reviewed following labs and imaging studies  CBC: Recent Labs  Lab 03/07/2019 0154 03/17/2019 0755 03/17/2019 1221 02/22/19 0945 02/23/19 0204 02/24/19 0212  WBC 7.3 7.0 5.5 10.9* 12.0* 16.8*  NEUTROABS 5.8  --   --   --   --   --   HGB 9.4* 9.2* 9.5* 8.1* 7.9* 8.1*  HCT 28.0* 28.2* 29.2* 25.8* 25.0* 26.3*  MCV 102.6* 103.7* 105.0* 108.4* 107.8* 107.3*  PLT 262 301 308 268 271 124   Basic Metabolic Panel: Recent Labs  Lab 03/01/2019 0154 02/21/19 0301 02/22/19 1121 02/23/19 0204 02/24/19 0212  NA 137 137 143 144 147*  K 5.3* 4.5 3.4* 3.2* 3.5  CL 90* 92* 100 103 106  CO2 29 32 30 30 31   GLUCOSE 194* 147* 138* 158* 162*  BUN 83* 106* 72* 62* 48*  CREATININE 2.31* 2.65* 1.27* 1.08* 0.97  CALCIUM 8.7* 7.4* 7.3* 7.3* 7.6*  MG  --  2.5*  --   --   --    GFR: Estimated Creatinine Clearance: 32.6 mL/min (by C-G formula based on SCr of 0.97 mg/dL). Liver Function Tests: Recent Labs  Lab 03/19/2019 0154 02/22/19 1121  AST 25 36  ALT 7 12  ALKPHOS 48 47  BILITOT 1.5* 1.8*  PROT 7.1 5.7*  ALBUMIN 3.1* 2.5*   No results for input(s): LIPASE, AMYLASE in the last 168 hours. No results for input(s): AMMONIA in the last 168 hours. Coagulation Profile: No results for input(s): INR, PROTIME in the last 168 hours. Cardiac Enzymes: No results for input(s): CKTOTAL, CKMB, CKMBINDEX, TROPONINI in the last 168 hours. BNP (last 3 results) No results for input(s): PROBNP in the last 8760 hours. HbA1C: No results for input(s): HGBA1C in the last 72 hours. CBG: Recent Labs  Lab 02/23/19 1136 02/23/19 1723 02/23/19 2323 02/23/19 2349 02/24/19 0520  GLUCAP 140* 123* 151* 153* 130*   Lipid Profile: No results for input(s): CHOL, HDL, LDLCALC, TRIG, CHOLHDL, LDLDIRECT in the last 72 hours. Thyroid Function Tests: No results for input(s): TSH, T4TOTAL, FREET4,  T3FREE, THYROIDAB in the last 72 hours. Anemia Panel: No results for input(s): VITAMINB12, FOLATE, FERRITIN, TIBC, IRON, RETICCTPCT in the last 72 hours. Urine analysis:    Component Value Date/Time   COLORURINE YELLOW 08/24/2016 1020   APPEARANCEUR CLEAR 08/24/2016 1020   LABSPEC 1.025 08/24/2016 1020   PHURINE 5.5 08/24/2016 1020   GLUCOSEU NEGATIVE 08/24/2016 1020   HGBUR NEGATIVE 08/24/2016 1020   BILIRUBINUR NEGATIVE 08/24/2016 1020   KETONESUR NEGATIVE 08/24/2016 1020   PROTEINUR NEGATIVE 02/01/2011 1545   UROBILINOGEN 0.2 08/24/2016 1020   NITRITE POSITIVE (A) 08/24/2016 1020   LEUKOCYTESUR MODERATE (A) 08/24/2016 1020   Sepsis Labs: @LABRCNTIP (procalcitonin:4,lacticidven:4)  ) Recent Results (from the past 240 hour(s))  Surgical pcr screen     Status: None   Collection Time: 02/15/19  3:24 PM   Specimen: Nasal Mucosa; Nasal Swab  Result Value Ref Range Status   MRSA, PCR NEGATIVE NEGATIVE Final   Staphylococcus aureus NEGATIVE NEGATIVE Final    Comment: (NOTE) The Xpert SA Assay (FDA approved for NASAL  specimens in patients 63 years of age and older), is one component of a comprehensive surveillance program. It is not intended to diagnose infection nor to guide or monitor treatment. Performed at [facility], [place] [address], [place], [place] [address]   SARS CORONAVIRUS 2 (TAT 6-24 HRS) Nasopharyngeal Nasopharyngeal Swab     Status: None   Collection Time: 03/06/2019  2:32 PM   Specimen: Nasopharyngeal Swab  Result Value Ref Range Status   SARS Coronavirus 2 NEGATIVE NEGATIVE Final    Comment: (NOTE) SARS-CoV-2 target nucleic acids are NOT DETECTED. The SARS-CoV-2 RNA is generally detectable in upper and lower respiratory specimens during the acute phase of infection. Negative results do not preclude SARS-CoV-2 infection, do not rule out co-infections with other pathogens, and should not be used as the sole basis for treatment or other patient management  decisions. Negative results must be combined with clinical observations, patient history, and epidemiological information. The expected result is Negative. Fact Sheet for Patients: SugarRoll.be Fact Sheet for Healthcare Providers: https://www.woods-mathews.com/ This test is not yet approved or cleared by the [place] FDA and  has been authorized for detection and/or diagnosis of SARS-CoV-2 by FDA under an Emergency Use Authorization (EUA). This EUA will remain  in effect (meaning this test can be used) for the duration of the COVID-19 declaration under Section 56 4(b)(1) of the Act, 21 U.S.C. section 360bbb-3(b)(1), unless the authorization is terminated or revoked sooner. Performed at [facility], [place] [address], [place], [place] [address]          Radiology Studies: Dg Abd 1 View  Result Date: 02/22/2019 CLINICAL DATA:  Concern for NG tube position. EXAM: ABDOMEN - 1 VIEW 3:07 p.m. COMPARISON:  Radiograph dated 02/22/2019 5:54 a.m. FINDINGS: There is no visible NG tube in the esophagus or abdomen. No distended loops of bowel. Slight atelectasis at both lung bases. Pacemaker in place. Cardiomegaly. No acute bone abnormality. Multiple old compression fractures in the lumbar spine. IMPRESSION: 1. Benign-appearing abdomen. No visible NG tube. 2. Slight atelectasis at the lung bases. 3. Cardiomegaly. Electronically Signed   By: Lorriane Shire M.D.   On: 02/22/2019 15:28   Dg Abd Portable 1v-small Bowel Obstruction Protocol-initial, 8 Hr Delay  Result Date: 02/23/2019 CLINICAL DATA:  85 year old female with small bowel obstruction follow-up. EXAM: PORTABLE ABDOMEN - 1 VIEW COMPARISON:  Earlier radiograph dated 02/23/2019. FINDINGS: Stable positioning of the NG tube which appears kinked just distal to the side port. Persistent dilatation of small-bowel measuring up to 4.5 cm in caliber. Degenerative changes of the spine. IMPRESSION: 1.  Persistent small bowel dilatation. 2. Stable appearance and positioning of the NG tube as above. Electronically Signed   By: Anner Crete M.D.   On: 02/23/2019 23:03   Dg Abd Portable 1v  Result Date: 02/23/2019 CLINICAL DATA:  Small bowel obstruction. EXAM: PORTABLE ABDOMEN - 1 VIEW COMPARISON:  One-view abdomen 02/22/2019 FINDINGS: NG tube is in the stomach. A large hiatal hernia is again noted. The NG tube is likely within the intrathoracic component of the stomach. Dilated loops of small bowel are seen over the lower abdomen. There is some gas in the colon. No free air is evident. IMPRESSION: 1. Persistent dilated loops of small bowel compatible with small bowel obstruction/ileus. 2. No free air. 3. Stable positioning of the nasogastric tube. Electronically Signed   By: San Morelle M.D.   On: 02/23/2019 07:44   Dg Abd Portable 1v  Result Date: 02/22/2019 CLINICAL  DATA:  NG tube placement. EXAM: PORTABLE ABDOMEN - 1 VIEW COMPARISON:  Prior abdominal radiographs. CT 02/21/2019 FINDINGS: Tip and side port of the enteric tube within the stomach, patient is large hiatal hernia, and it is unclear whether the enteric tube is in the stomach located above rib below the diaphragm. Dilated fluid-filled bowel on recent CT not visualized radiographically. IMPRESSION: 1. Tip and side port of the enteric tube within the stomach. 2. Large hiatal hernia, it is unclear whether the enteric tube is within the intrathoracic or intra-abdominal portion of the stomach. Electronically Signed   By: Keith Rake M.D.   On: 02/22/2019 17:28        Scheduled Meds: . bisacodyl  10 mg Rectal Daily  . Chlorhexidine Gluconate Cloth  6 each Topical Daily  . lip balm  1 application Topical BID  . pantoprazole (PROTONIX) IV  40 mg Intravenous Q12H   Continuous Infusions: . dextrose 5 % and 0.45 % NaCl with KCl 20 mEq/L 75 mL/hr at 02/23/19 2324  . erythromycin 250 mg (02/24/19 0505)  . iron dextran  (INFED/DEXFERRUM) infusion     Followed by  . iron dextran (INFED/DEXFERRUM) infusion    . potassium chloride 10 mEq (02/24/19 0933)     LOS: 4 days    Time spent: 25 minutes. Greater than 50% of this time was spent in direct contact with the patient, coordinating care and discussing relevant ongoing clinical issues.     Lelon Frohlich, MD [facility] Pager [phone]  If 7PM-7AM, please contact night-coverage [email or web address] Password [email or web address] 02/24/2019, 10:24 AM

## 2019-02-24 NOTE — Progress Notes (Signed)
Hannah Mercado [id number] 1922/05/29  CARE TEAM:  PCP: Patient, No Pcp Per  Outpatient Care Team: Patient Care Team: Patient, No Pcp Per as PCP - General (General Practice) Curt Bears, Ocie Doyne, MD as PCP - Electrophysiology (Cardiology)  Inpatient Treatment Team: Treatment Team: Attending Provider: Isaac Bliss, Rayford Halsted, MD; Technician: Jabier Gauss, NT; Consulting Physician: Nolon Nations, MD; Case Manager: Michel Santee, PT; Registered Nurse: Charyl Dancer, RN; Registered Nurse: Kennith Center, RN; Technician: Marletta Lor, NT; Rounding Team: Briscoe Deutscher, MD   Problem List:   Principal Problem:   Small bowel obstruction [facility]) Active Problems:   Hypertension   Persistent atrial fibrillation ([place])   Pulmonary hypertension ([place])   Protein-calorie malnutrition, severe   Vomiting   ARF (acute renal failure) ([place])   SBO (small bowel obstruction) ([place])   Palliative care by specialist   Goals of care, counseling/discussion   Advanced care planning/counseling discussion   Acute renal failure superimposed on chronic kidney disease ([place])      * No surgery found *      Assessment   HTN CAD Pacemaker in place Pulmonary fibrosis/pulmonary HTN CKD A fib on eliquis (last dose 02/18/19) - please hold eliquis, ok for heparin gtt Right intertrochanteric femur fracture s/p IMN 02/17/19 by Dr. Lyla Glassing Hiatal hernia AKI  SBO vs Ileus -more likely ileus related to recent orthopedic surgery  [facility] Stay = 4 days)  Plan:  Continue NG tube to LIWS. Mobilize.Repeat film in AM.  - Started on erythromycin 250 mg IV every 6 for motility 12/3 >> day 2 -She is not toxic or sickly at this time.  Films not showing high-grade obstruction.  Looks more like ileostomy.  We will try and aggressively correct electrolytes and hydration and follow.  Hold off on operative exploration this weekend unless she declines.  ID -none VTE -SCDs, eliquison  hold/ok for heparin gtt FEN -IVF, NPO/NGT to LIWS Numbers look dehydrated.  Would give extra IV fluids since high output from NG tube.  Correct hypokalemia.  Check magnesium. ID: No definite infection but history of UTIs in the past with recent urine retention.  Send urine for culture just in case Anemia: Acute postoperative on top of chronic anemia.  Check anemia panel.  Gave IV iron . Foley -placed 12/1 for retention.  Consider removal.  INO cath as needed Follow up -TBD      LOS: 3 days   35 minutes spent in review, evaluation, examination, counseling, and coordination of care.  More than 50% of that time was spent in counseling.  02/24/2019    Subjective: (Chief complaint)  Denies abdominal pain.  Think she may have passed some gas.  Trying to tolerate nasogastric tube.  Pain controlled.  Objective:  Vital signs:  Vitals:   02/23/19 0552 02/23/19 1707 02/23/19 2136 02/24/19 0524  BP: (!) 121/51 122/62 (!) 121/50 (!) 124/45  Pulse: 63 63 63 62  Resp: 18 17 18 20   Temp: 98.9 F (37.2 C) 98.5 F (36.9 C) 97.7 F (36.5 C) 98.1 F (36.7 C)  TempSrc: Oral  Oral Oral  SpO2: 98% 96% 100% 95%  Weight:    58.2 kg  Height:        Last BM Date: 02/15/19  Intake/Output   Yesterday:  12/04 0701 - 12/05 0700 In: 2229.3 [P.O.:50; I.V.:1552.5; IV Piggyback:626.8] Out: 2100 [Urine:750; Emesis/NG output:1350] This shift:  No intake/output data recorded.  Bowel function:  Flatus: ??  BM:  No  Drain: Nasogastric tube with brown moderately thick effluent   Physical Exam:  General: Pt awake/alert/oriented x4 in no acute distress Eyes: PERRL, normal EOM.  Sclera clear.  No icterus Neuro: CN II-XII intact w/o focal sensory/motor deficits. Lymph: No head/neck/groin lymphadenopathy Psych:  No delerium/psychosis/paranoia.  Mumbles somewhat but alert.  No agitation nor somnolence HENT: Normocephalic, Mucus membranes moist.  No thrush Neck: Supple, No tracheal  deviation Chest: No chest wall pain w good excursion CV:  Pulses intact.  Regular rhythm MS: Normal AROM mjr joints.  No obvious deformity  Abdomen: Soft.  Mildy distended.  Nontender.  No evidence of peritonitis.  No incarcerated hernias.  Ext:  No deformity.  No mjr edema.  No cyanosis Skin: No petechiae / purpura  Results:   Cultures: Recent Results (from the past 720 hour(s))  Surgical pcr screen     Status: None   Collection Time: 02/15/19  3:24 PM   Specimen: Nasal Mucosa; Nasal Swab  Result Value Ref Range Status   MRSA, PCR NEGATIVE NEGATIVE Final   Staphylococcus aureus NEGATIVE NEGATIVE Final    Comment: (NOTE) The Xpert SA Assay (FDA approved for NASAL specimens in patients 42 years of age and older), is one component of a comprehensive surveillance program. It is not intended to diagnose infection nor to guide or monitor treatment. Performed at [facility], [place] [address] [place], [place] [address]   SARS CORONAVIRUS 2 (TAT 6-24 HRS) Nasopharyngeal Nasopharyngeal Swab     Status: None   Collection Time: 02/21/2019  2:32 PM   Specimen: Nasopharyngeal Swab  Result Value Ref Range Status   SARS Coronavirus 2 NEGATIVE NEGATIVE Final    Comment: (NOTE) SARS-CoV-2 target nucleic acids are NOT DETECTED. The SARS-CoV-2 RNA is generally detectable in upper and lower respiratory specimens during the acute phase of infection. Negative results do not preclude SARS-CoV-2 infection, do not rule out co-infections with other pathogens, and should not be used as the sole basis for treatment or other patient management decisions. Negative results must be combined with clinical observations, patient history, and epidemiological information. The expected result is Negative. Fact Sheet for Patients: SugarRoll.be Fact Sheet for Healthcare Providers: https://www.woods-mathews.com/ This test is not yet approved or cleared by the [place] FDA and  has been authorized for detection and/or diagnosis of SARS-CoV-2 by FDA under an Emergency Use Authorization (EUA). This EUA will remain  in effect (meaning this test can be used) for the duration of the COVID-19 declaration under Section 56 4(b)(1) of the Act, 21 U.S.C. section 360bbb-3(b)(1), unless the authorization is terminated or revoked sooner. Performed at [facility], [place] [address] [place], [place] [address]     Labs: Results for orders placed or performed during the hospital encounter of 03/17/2019 (from the past 48 hour(s))  CBC     Status: Abnormal   Collection Time: 02/22/19  9:45 AM  Result Value Ref Range   WBC 10.9 (H) 4.0 - 10.5 K/uL   RBC 2.38 (L) 3.87 - 5.11 MIL/uL   Hemoglobin 8.1 (L) 12.0 - 15.0 g/dL   HCT 25.8 (L) 36.0 - 46.0 %   MCV 108.4 (H) 80.0 - 100.0 fL   MCH 34.0 26.0 - 34.0 pg   MCHC 31.4 30.0 - 36.0 g/dL   RDW 14.5 11.5 - 15.5 %   Platelets 268 150 - 400 K/uL   nRBC 0.9 (H) 0.0 - 0.2 %    Comment: Performed at [facility],  [address], [place], [place] [address]  Glucose, capillary     Status: Abnormal   Collection Time: 02/22/19 11:16 AM  Result Value Ref Range   Glucose-Capillary 132 (H) 70 - 99 mg/dL   Comment 1 Notify RN   Comprehensive metabolic panel     Status: Abnormal   Collection Time: 02/22/19 11:21 AM  Result Value Ref Range   Sodium 143 135 - 145 mmol/L   Potassium 3.4 (L) 3.5 - 5.1 mmol/L   Chloride 100 98 - 111 mmol/L   CO2 30 22 - 32 mmol/L   Glucose, Bld 138 (H) 70 - 99 mg/dL   BUN 72 (H) 8 - 23 mg/dL   Creatinine, Ser 1.27 (H) 0.44 - 1.00 mg/dL    Comment: DELTA CHECK NOTED RESULTS VERIFIED VIA RECOLLECT    Calcium 7.3 (L) 8.9 - 10.3 mg/dL   Total Protein 5.7 (L) 6.5 - 8.1 g/dL   Albumin 2.5 (L) 3.5 - 5.0 g/dL   AST 36 15 - 41 U/L   ALT 12 0 - 44 U/L   Alkaline Phosphatase 47 38 - 126 U/L   Total Bilirubin 1.8 (H) 0.3 - 1.2 mg/dL   GFR calc non Af Amer 36 (L) >60 mL/min   GFR calc  Af Amer 42 (L) >60 mL/min   Anion gap 13 5 - 15    Comment: Performed at [facility], [place] [address] [place], [place] [address]  Glucose, capillary     Status: Abnormal   Collection Time: 02/22/19  5:44 PM  Result Value Ref Range   Glucose-Capillary 157 (H) 70 - 99 mg/dL  Glucose, capillary     Status: Abnormal   Collection Time: 02/23/19 12:28 AM  Result Value Ref Range   Glucose-Capillary 150 (H) 70 - 99 mg/dL   Comment 1 Notify RN    Comment 2 Document in Chart   CBC     Status: Abnormal   Collection Time: 02/23/19  2:04 AM  Result Value Ref Range   WBC 12.0 (H) 4.0 - 10.5 K/uL   RBC 2.32 (L) 3.87 - 5.11 MIL/uL   Hemoglobin 7.9 (L) 12.0 - 15.0 g/dL   HCT 25.0 (L) 36.0 - 46.0 %   MCV 107.8 (H) 80.0 - 100.0 fL   MCH 34.1 (H) 26.0 - 34.0 pg   MCHC 31.6 30.0 - 36.0 g/dL   RDW 14.6 11.5 - 15.5 %   Platelets 271 150 - 400 K/uL   nRBC 0.5 (H) 0.0 - 0.2 %    Comment: Performed at [facility], [address], [place], [place] [address]  Basic metabolic panel     Status: Abnormal   Collection Time: 02/23/19  2:04 AM  Result Value Ref Range   Sodium 144 135 - 145 mmol/L   Potassium 3.2 (L) 3.5 - 5.1 mmol/L   Chloride 103 98 - 111 mmol/L   CO2 30 22 - 32 mmol/L   Glucose, Bld 158 (H) 70 - 99 mg/dL   BUN 62 (H) 8 - 23 mg/dL   Creatinine, Ser 1.08 (H) 0.44 - 1.00 mg/dL   Calcium 7.3 (L) 8.9 - 10.3 mg/dL   GFR calc non Af Amer 44 (L) >60 mL/min   GFR calc Af Amer 51 (L) >60 mL/min   Anion gap 11 5 - 15    Comment: Performed at [facility] [address] [place], [place] [address]  Glucose, capillary     Status: Abnormal  Collection Time: 02/23/19  6:03 AM  Result Value Ref Range   Glucose-Capillary 156 (H) 70 - 99 mg/dL   Comment 1 Notify RN    Comment 2 Document in Chart   Glucose, capillary     Status: Abnormal   Collection Time: 02/23/19 11:36 AM  Result Value Ref Range   Glucose-Capillary 140 (H) 70 - 99 mg/dL  Glucose, capillary     Status:  Abnormal   Collection Time: 02/23/19  5:23 PM  Result Value Ref Range   Glucose-Capillary 123 (H) 70 - 99 mg/dL  Glucose, capillary     Status: Abnormal   Collection Time: 02/23/19 11:23 PM  Result Value Ref Range   Glucose-Capillary 151 (H) 70 - 99 mg/dL  Glucose, capillary     Status: Abnormal   Collection Time: 02/23/19 11:49 PM  Result Value Ref Range   Glucose-Capillary 153 (H) 70 - 99 mg/dL  CBC     Status: Abnormal   Collection Time: 02/24/19  2:12 AM  Result Value Ref Range   WBC 16.8 (H) 4.0 - 10.5 K/uL   RBC 2.45 (L) 3.87 - 5.11 MIL/uL   Hemoglobin 8.1 (L) 12.0 - 15.0 g/dL   HCT 26.3 (L) 36.0 - 46.0 %   MCV 107.3 (H) 80.0 - 100.0 fL   MCH 33.1 26.0 - 34.0 pg   MCHC 30.8 30.0 - 36.0 g/dL   RDW 14.5 11.5 - 15.5 %   Platelets 301 150 - 400 K/uL   nRBC 0.5 (H) 0.0 - 0.2 %    Comment: Performed at [facility], [address] [place], [place] [address]  Basic metabolic panel     Status: Abnormal   Collection Time: 02/24/19  2:12 AM  Result Value Ref Range   Sodium 147 (H) 135 - 145 mmol/L   Potassium 3.5 3.5 - 5.1 mmol/L   Chloride 106 98 - 111 mmol/L   CO2 31 22 - 32 mmol/L   Glucose, Bld 162 (H) 70 - 99 mg/dL   BUN 48 (H) 8 - 23 mg/dL   Creatinine, Ser 0.97 0.44 - 1.00 mg/dL   Calcium 7.6 (L) 8.9 - 10.3 mg/dL   GFR calc non Af Amer 50 (L) >60 mL/min   GFR calc Af Amer 58 (L) >60 mL/min   Anion gap 10 5 - 15    Comment: Performed at [facility] [address], [place], [place] [address]  Glucose, capillary     Status: Abnormal   Collection Time: 02/24/19  5:20 AM  Result Value Ref Range   Glucose-Capillary 130 (H) 70 - 99 mg/dL    Imaging / Studies: Dg Abd 1 View  Result Date: 02/22/2019 CLINICAL DATA:  Concern for NG tube position. EXAM: ABDOMEN - 1 VIEW 3:07 p.m. COMPARISON:  Radiograph dated 02/22/2019 5:54 a.m. FINDINGS: There is no visible NG tube in the esophagus or abdomen. No distended loops of bowel. Slight atelectasis at both lung  bases. Pacemaker in place. Cardiomegaly. No acute bone abnormality. Multiple old compression fractures in the lumbar spine. IMPRESSION: 1. Benign-appearing abdomen. No visible NG tube. 2. Slight atelectasis at the lung bases. 3. Cardiomegaly. Electronically Signed   By: Lorriane Shire M.D.   On: 02/22/2019 15:28   Dg Abd Portable 1v-small Bowel Obstruction Protocol-initial, 8 Hr Delay  Result Date: 02/23/2019 CLINICAL DATA:  85 year old female with small bowel obstruction follow-up. EXAM: PORTABLE ABDOMEN - 1 VIEW COMPARISON:  Earlier radiograph dated 02/23/2019. FINDINGS: Stable positioning of  the NG tube which appears kinked just distal to the side port. Persistent dilatation of small-bowel measuring up to 4.5 cm in caliber. Degenerative changes of the spine. IMPRESSION: 1. Persistent small bowel dilatation. 2. Stable appearance and positioning of the NG tube as above. Electronically Signed   By: Anner Crete M.D.   On: 02/23/2019 23:03   Dg Abd Portable 1v  Result Date: 02/23/2019 CLINICAL DATA:  Small bowel obstruction. EXAM: PORTABLE ABDOMEN - 1 VIEW COMPARISON:  One-view abdomen 02/22/2019 FINDINGS: NG tube is in the stomach. A large hiatal hernia is again noted. The NG tube is likely within the intrathoracic component of the stomach. Dilated loops of small bowel are seen over the lower abdomen. There is some gas in the colon. No free air is evident. IMPRESSION: 1. Persistent dilated loops of small bowel compatible with small bowel obstruction/ileus. 2. No free air. 3. Stable positioning of the nasogastric tube. Electronically Signed   By: San Morelle M.D.   On: 02/23/2019 07:44   Dg Abd Portable 1v  Result Date: 02/22/2019 CLINICAL DATA:  NG tube placement. EXAM: PORTABLE ABDOMEN - 1 VIEW COMPARISON:  Prior abdominal radiographs. CT 03/05/2019 FINDINGS: Tip and side port of the enteric tube within the stomach, patient is large hiatal hernia, and it is unclear whether the enteric tube  is in the stomach located above rib below the diaphragm. Dilated fluid-filled bowel on recent CT not visualized radiographically. IMPRESSION: 1. Tip and side port of the enteric tube within the stomach. 2. Large hiatal hernia, it is unclear whether the enteric tube is within the intrathoracic or intra-abdominal portion of the stomach. Electronically Signed   By: Keith Rake M.D.   On: 02/22/2019 17:28    Medications / Allergies: per chart  Antibiotics: Anti-infectives (From admission, onward)   Start     Dose/Rate Route Frequency Ordered Stop   02/22/19 1230  erythromycin 250 mg in sodium chloride 0.9 % 100 mL IVPB     250 mg 100 mL/hr over 60 Minutes Intravenous Every 6 hours 02/22/19 1130          Note: Portions of this report may have been transcribed using voice recognition software. Every effort was made to ensure accuracy; however, inadvertent computerized transcription errors may be present.   Any transcriptional errors that result from this process are unintentional.     Adin Hector, MD, FACS, MASCRS Gastrointestinal and Minimally Invasive Surgery    [address], [place], [place] [address] [phone] Main / Paging [phone] Fax

## 2019-02-25 ENCOUNTER — Inpatient Hospital Stay (HOSPITAL_COMMUNITY): Payer: Medicare Other

## 2019-02-25 DIAGNOSIS — N390 Urinary tract infection, site not specified: Secondary | ICD-10-CM

## 2019-02-25 DIAGNOSIS — E43 Unspecified severe protein-calorie malnutrition: Secondary | ICD-10-CM

## 2019-02-25 DIAGNOSIS — I10 Essential (primary) hypertension: Secondary | ICD-10-CM

## 2019-02-25 DIAGNOSIS — K9189 Other postprocedural complications and disorders of digestive system: Secondary | ICD-10-CM

## 2019-02-25 DIAGNOSIS — K567 Ileus, unspecified: Principal | ICD-10-CM

## 2019-02-25 DIAGNOSIS — A419 Sepsis, unspecified organism: Secondary | ICD-10-CM

## 2019-02-25 LAB — LACTIC ACID, PLASMA
Lactic Acid, Venous: 1.4 mmol/L (ref 0.5–1.9)
Lactic Acid, Venous: 1.5 mmol/L (ref 0.5–1.9)

## 2019-02-25 LAB — COMPREHENSIVE METABOLIC PANEL
ALT: 25 U/L (ref 0–44)
AST: 41 U/L (ref 15–41)
Albumin: 2.3 g/dL — ABNORMAL LOW (ref 3.5–5.0)
Alkaline Phosphatase: 60 U/L (ref 38–126)
Anion gap: 7 (ref 5–15)
BUN: 38 mg/dL — ABNORMAL HIGH (ref 8–23)
CO2: 29 mmol/L (ref 22–32)
Calcium: 7.6 mg/dL — ABNORMAL LOW (ref 8.9–10.3)
Chloride: 109 mmol/L (ref 98–111)
Creatinine, Ser: 0.96 mg/dL (ref 0.44–1.00)
GFR calc Af Amer: 59 mL/min — ABNORMAL LOW (ref >60)
GFR calc non Af Amer: 51 mL/min — ABNORMAL LOW (ref >60)
Glucose, Bld: 132 mg/dL — ABNORMAL HIGH (ref 70–99)
Potassium: 3.8 mmol/L (ref 3.5–5.1)
Sodium: 145 mmol/L (ref 135–145)
Total Bilirubin: 1.2 mg/dL (ref 0.3–1.2)
Total Protein: 5.5 g/dL — ABNORMAL LOW (ref 6.5–8.1)

## 2019-02-25 LAB — RETICULOCYTES
Immature Retic Fract: 27.6 % — ABNORMAL HIGH (ref 2.3–15.9)
RBC.: 2.31 MIL/uL — ABNORMAL LOW (ref 3.87–5.11)
Retic Count, Absolute: 128.4 10*3/uL (ref 19.0–186.0)
Retic Ct Pct: 5.6 % — ABNORMAL HIGH (ref 0.4–3.1)

## 2019-02-25 LAB — IRON AND TIBC
Iron: 180 ug/dL — ABNORMAL HIGH (ref 28–170)
Saturation Ratios: 87 % — ABNORMAL HIGH (ref 10.4–31.8)
TIBC: 207 ug/dL — ABNORMAL LOW (ref 250–450)
UIBC: 27 ug/dL

## 2019-02-25 LAB — CBC
HCT: 25.3 % — ABNORMAL LOW (ref 36.0–46.0)
Hemoglobin: 7.7 g/dL — ABNORMAL LOW (ref 12.0–15.0)
MCH: 33.3 pg (ref 26.0–34.0)
MCHC: 30.4 g/dL (ref 30.0–36.0)
MCV: 109.5 fL — ABNORMAL HIGH (ref 80.0–100.0)
Platelets: 317 10*3/uL (ref 150–400)
RBC: 2.31 MIL/uL — ABNORMAL LOW (ref 3.87–5.11)
RDW: 14.9 % (ref 11.5–15.5)
WBC: 22.5 10*3/uL — ABNORMAL HIGH (ref 4.0–10.5)
nRBC: 0.5 % — ABNORMAL HIGH (ref 0.0–0.2)

## 2019-02-25 LAB — FOLATE: Folate: 30.3 ng/mL (ref >5.9)

## 2019-02-25 LAB — CK TOTAL AND CKMB (NOT AT ARMC)
CK, MB: 1.8 ng/mL (ref 0.5–5.0)
Relative Index: INVALID (ref 0.0–2.5)
Total CK: 71 U/L (ref 38–234)

## 2019-02-25 LAB — GLUCOSE, CAPILLARY
Glucose-Capillary: 124 mg/dL — ABNORMAL HIGH (ref 70–99)
Glucose-Capillary: 124 mg/dL — ABNORMAL HIGH (ref 70–99)
Glucose-Capillary: 124 mg/dL — ABNORMAL HIGH (ref 70–99)
Glucose-Capillary: 112 mg/dL — ABNORMAL HIGH (ref 70–99)

## 2019-02-25 LAB — BASIC METABOLIC PANEL
Anion gap: 11 (ref 5–15)
BUN: 42 mg/dL — ABNORMAL HIGH (ref 8–23)
CO2: 29 mmol/L (ref 22–32)
Calcium: 7.6 mg/dL — ABNORMAL LOW (ref 8.9–10.3)
Chloride: 106 mmol/L (ref 98–111)
Creatinine, Ser: 0.97 mg/dL (ref 0.44–1.00)
GFR calc Af Amer: 58 mL/min — ABNORMAL LOW (ref >60)
GFR calc non Af Amer: 50 mL/min — ABNORMAL LOW (ref >60)
Glucose, Bld: 126 mg/dL — ABNORMAL HIGH (ref 70–99)
Potassium: 3.8 mmol/L (ref 3.5–5.1)
Sodium: 146 mmol/L — ABNORMAL HIGH (ref 135–145)

## 2019-02-25 LAB — PROCALCITONIN: Procalcitonin: 1.2 ng/mL

## 2019-02-25 LAB — VITAMIN B12: Vitamin B-12: 1895 pg/mL — ABNORMAL HIGH (ref 180–914)

## 2019-02-25 LAB — FERRITIN: Ferritin: 204 ng/mL (ref 11–307)

## 2019-02-25 MED ORDER — SODIUM CHLORIDE 0.9 % IV SOLN
1.0000 g | INTRAVENOUS | Status: DC
  Administered 2019-02-25 – 2019-02-27 (×3): 1 g via INTRAVENOUS
  Filled 2019-02-25: qty 10
  Filled 2019-02-25 (×3): qty 1

## 2019-02-25 NOTE — Progress Notes (Signed)
PROGRESS NOTE    Hannah Mercado  LEX:[id number] DOB: [birth year] DOA: 03/20/2019 PCP: Patient, No Pcp Per     Brief Narrative:  85 year old WF PMHx anxiety, persistent atrial fibrillation on Eliquis, CAD, cardiac pacemaker, HTN, MV disorder, HLD, pulmonary fibrosis, chronic respiratory failure with hypoxia on 2 L O2, CVI, hiatal hernia, rectal cancer, CKD, RIGHT Intertrochanteric  Femur Fx s/p intramedullary nail on 11/28   Admitted to the hospital on 03/01/2019 due to abdominal pain, distention, nausea and vomiting.  X-ray and CT scan of abdomen were obtained on admission that showed a significantly dilated stomach, duodenum and jejunum with transition point in the right hemipelvis, suspecting SBO related to adhesions.  She was admitted by surgery and we were asked to consult to manage medical issues.   Subjective: Last 24 hours afebrile, negative CP, negative abdominal pain, negative N/V.  States chronic S OB on 2 L O2 at home.   Assessment & Plan:   Principal Problem:   Small bowel obstruction (HCC) Active Problems:   Hypertension   Persistent atrial fibrillation (HCC)   Pulmonary hypertension (HCC)   Protein-calorie malnutrition, severe   Vomiting   ARF (acute renal failure) (HCC)   SBO (small bowel obstruction) ([place])   Palliative care by specialist   Goals of care, counseling/discussion   Advanced care planning/counseling discussion   Acute renal failure superimposed on chronic kidney disease ([place])  Small bowel obstruction/ileus  -NG tube accidentally pulled by patient overnight.  Currently asymptomatic. -Per surgery "replacing NG tube.  As patient continues to be asymptomatic -Low threshold for replacing NG tube -12/3 started on erythromycin 250 mg IV QID -If no improvement may need abdominal exploration  Urosepsis -Leukocytosis,+ procalcitonin -Ceftriaxone per pharmacy x5 days  Acute renal failure -Most likely secondary to urosepsis -D5W+ 0.45% saline +  KCl 20 mEq at 75 ml/hr  Recent Labs  Lab 02/22/19 1121 02/23/19 0204 02/24/19 0212 02/25/19 0248 02/25/19 0919  CREATININE 1.27* 1.08* 0.97 0.97 0.96    Hyperkalemia -Resolved  Anemia of chronic disease (baseline mid 9) Recent Labs  Lab 03/08/2019 1221 02/22/19 0945 02/23/19 0204 02/24/19 0212 02/25/19 0248  HGB 9.5* 8.1* 7.9* 8.1* 7.7*  -Slowly trending down most likely hemodilution -Monitor closely -No signs of overt bleed -Hemoccult pending -Gastroccult pending  Atrial fibrillation -Rate controlled/paced rhythm -As outpatient on Eliquis on hold in case of need for surgery -BP controlled  Essential HTN -See A. Fib  CAD -See A. fib  History of rectal cancer -Noted  Status post recent hip surgery -Stable     DVT prophylaxis: SCD Code Status: DNR Family Communication:  Disposition Plan: TBD   Consultants:  General surgery   Procedures/Significant Events:  12/6 acute abdominal series;-persistent gas-filled loops of small bowel in the lower abdomen, measuring up to 4.5 cm in diameter. Findings are consistent with persistent small bowel obstruction. -Interval removal of nasogastric tube. - Bibasilar atelectasis/airspace disease, right greater than left.  I have personally reviewed and interpreted all radiology studies and my findings are as above.  VENTILATOR SETTINGS:    Cultures 12/5 urine positive Serratia marcescens   Antimicrobials: Anti-infectives (From admission, onward)   Start     Stop   02/25/19 1445  cefTRIAXone (ROCEPHIN) 1 g in sodium chloride 0.9 % 100 mL IVPB         02/22/19 1230  erythromycin 250 mg in sodium chloride 0.9 % 100 mL IVPB             Devices  LINES / TUBES:      Continuous Infusions: . dextrose 5 % and 0.45 % NaCl with KCl 20 mEq/L 75 mL/hr at 02/24/19 2143  . erythromycin 250 mg (02/25/19 0602)     Objective: Vitals:   02/24/19 1446 02/24/19 2149 02/25/19 0500 02/25/19 0606  BP: (!)  113/54 (!) 120/51  (!) 104/58  Pulse: 63 63  62  Resp: 18 20  17   Temp: 98.2 F (36.8 C) 99.2 F (37.3 C)  99.3 F (37.4 C)  TempSrc: Oral Oral  Oral  SpO2: 100% 96%  94%  Weight:   59.7 kg   Height:        Intake/Output Summary (Last 24 hours) at 02/25/2019 0817 Last data filed at 02/25/2019 5170 Gross per 24 hour  Intake 2409.52 ml  Output 2100 ml  Net 309.52 ml   Filed Weights   02/23/19 0500 02/24/19 0524 02/25/19 0500  Weight: 59.2 kg 58.2 kg 59.7 kg    Examination:  General: A/O x4, positive chronic respiratory distress (on 2 L O2; home dose)  eyes: negative scleral hemorrhage, negative anisocoria, negative icterus ENT: Negative Runny nose, negative gingival bleeding, Neck:  Negative scars, masses, torticollis, lymphadenopathy, JVD Lungs: Clear to auscultation bilaterally without wheezes or crackles Cardiovascular: Regular rate and rhythm (paced) without murmur gallop or rub normal S1 and S2 Abdomen: negative abdominal pain, positive distention, positive soft, hypoactive bowel sounds, no rebound, no ascites, no appreciable mass Extremities: No significant cyanosis, clubbing, or edema bilateral lower extremities Skin: Negative rashes, lesions, ulcers Psychiatric:  Negative depression, negative anxiety, negative fatigue, negative mania  Central nervous system:  Cranial nerves II through XII intact, tongue/uvula midline, all extremities muscle strength 5/5, sensation intact throughout, negative dysarthria, negative expressive aphasia, negative receptive aphasia.  .     Data Reviewed: Care during the described time interval was provided by me .  I have reviewed this patient's available data, including medical history, events of note, physical examination, and all test results as part of my evaluation.  CBC: Recent Labs  Lab 02/21/2019 0154  03/15/2019 1221 02/22/19 0945 02/23/19 0204 02/24/19 0212 02/25/19 0248  WBC 7.3   < > 5.5 10.9* 12.0* 16.8* 22.5*  NEUTROABS  5.8  --   --   --   --   --   --   HGB 9.4*   < > 9.5* 8.1* 7.9* 8.1* 7.7*  HCT 28.0*   < > 29.2* 25.8* 25.0* 26.3* 25.3*  MCV 102.6*   < > 105.0* 108.4* 107.8* 107.3* 109.5*  PLT 262   < > 308 268 271 301 317   < > = values in this interval not displayed.   Basic Metabolic Panel: Recent Labs  Lab 02/21/19 0301 02/22/19 1121 02/23/19 0204 02/24/19 0212 02/25/19 0248  NA 137 143 144 147* 146*  K 4.5 3.4* 3.2* 3.5 3.8  CL 92* 100 103 106 106  CO2 32 30 30 31 29   GLUCOSE 147* 138* 158* 162* 126*  BUN 106* 72* 62* 48* 42*  CREATININE 2.65* 1.27* 1.08* 0.97 0.97  CALCIUM 7.4* 7.3* 7.3* 7.6* 7.6*  MG 2.5*  --   --  2.7*  --    GFR: Estimated Creatinine Clearance: 33.4 mL/min (by C-G formula based on SCr of 0.97 mg/dL). Liver Function Tests: Recent Labs  Lab 03/06/2019 0154 02/22/19 1121  AST 25 36  ALT 7 12  ALKPHOS 48 47  BILITOT 1.5* 1.8*  PROT 7.1 5.7*  ALBUMIN 3.1* 2.5*  No results for input(s): LIPASE, AMYLASE in the last 168 hours. No results for input(s): AMMONIA in the last 168 hours. Coagulation Profile: No results for input(s): INR, PROTIME in the last 168 hours. Cardiac Enzymes: No results for input(s): CKTOTAL, CKMB, CKMBINDEX, TROPONINI in the last 168 hours. BNP (last 3 results) No results for input(s): PROBNP in the last 8760 hours. HbA1C: No results for input(s): HGBA1C in the last 72 hours. CBG: Recent Labs  Lab 02/23/19 2349 02/24/19 0520 02/24/19 1203 02/25/19 0248 02/25/19 0602  GLUCAP 153* 130* 101* 124* 124*   Lipid Profile: No results for input(s): CHOL, HDL, LDLCALC, TRIG, CHOLHDL, LDLDIRECT in the last 72 hours. Thyroid Function Tests: No results for input(s): TSH, T4TOTAL, FREET4, T3FREE, THYROIDAB in the last 72 hours. Anemia Panel: Recent Labs    02/25/19 0248  VITAMINB12 1,895*  FOLATE 30.3  FERRITIN 204  TIBC 207*  IRON 180*  RETICCTPCT 5.6*   Sepsis Labs: No results for input(s): PROCALCITON, LATICACIDVEN in the last  168 hours.  Recent Results (from the past 240 hour(s))  Surgical pcr screen     Status: None   Collection Time: 02/15/19  3:24 PM   Specimen: Nasal Mucosa; Nasal Swab  Result Value Ref Range Status   MRSA, PCR NEGATIVE NEGATIVE Final   Staphylococcus aureus NEGATIVE NEGATIVE Final    Comment: (NOTE) The Xpert SA Assay (FDA approved for NASAL specimens in patients 84 years of age and older), is one component of a comprehensive surveillance program. It is not intended to diagnose infection nor to guide or monitor treatment. Performed at [facility], [place] [address] [place], [place] [address]   SARS CORONAVIRUS 2 (TAT 6-24 HRS) Nasopharyngeal Nasopharyngeal Swab     Status: None   Collection Time: 03/22/2019  2:32 PM   Specimen: Nasopharyngeal Swab  Result Value Ref Range Status   SARS Coronavirus 2 NEGATIVE NEGATIVE Final    Comment: (NOTE) SARS-CoV-2 target nucleic acids are NOT DETECTED. The SARS-CoV-2 RNA is generally detectable in upper and lower respiratory specimens during the acute phase of infection. Negative results do not preclude SARS-CoV-2 infection, do not rule out co-infections with other pathogens, and should not be used as the sole basis for treatment or other patient management decisions. Negative results must be combined with clinical observations, patient history, and epidemiological information. The expected result is Negative. Fact Sheet for Patients: SugarRoll.be Fact Sheet for Healthcare Providers: https://www.[employer] [employer]-mathews.com/ This test is not yet approved or cleared by the [place] FDA and  has been authorized for detection and/or diagnosis of SARS-CoV-2 by FDA under an Emergency Use Authorization (EUA). This EUA will remain  in effect (meaning this test can be used) for the duration of the COVID-19 declaration under Section 56 4(b)(1) of the Act, 21 U.S.C. section 360bbb-3(b)(1), unless the  authorization is terminated or revoked sooner. Performed at [facility], [place] [address], [place],  [address]          Radiology Studies: Dg Abd Portable 1v-small Bowel Obstruction Protocol-initial, 8 Hr Delay  Result Date: 02/23/2019 CLINICAL DATA:  85 year old female with small bowel obstruction follow-up. EXAM: PORTABLE ABDOMEN - 1 VIEW COMPARISON:  Earlier radiograph dated 02/23/2019. FINDINGS: Stable positioning of the NG tube which appears kinked just distal to the side port. Persistent dilatation of small-bowel measuring up to 4.5 cm in caliber. Degenerative changes of the spine. IMPRESSION: 1. Persistent small bowel dilatation. 2. Stable appearance and positioning of the NG tube as above. Electronically  Signed   By: Anner Crete M.D.   On: 02/23/2019 23:03        Scheduled Meds: . bisacodyl  10 mg Rectal Daily  . Chlorhexidine Gluconate Cloth  6 each Topical Daily  . lip balm  1 application Topical BID  . pantoprazole (PROTONIX) IV  40 mg Intravenous Q12H   Continuous Infusions: . dextrose 5 % and 0.45 % NaCl with KCl 20 mEq/L 75 mL/hr at 02/24/19 2143  . erythromycin 250 mg (02/25/19 0602)     LOS: 5 days    Time spent:40 min    Airica Schwartzkopf, Geraldo Docker, MD [facility] Pager [phone]  If 7PM-7AM, please contact night-coverage [email or web address] Password [email or web address] 02/25/2019, 8:17 AM

## 2019-02-25 NOTE — Progress Notes (Signed)
Hannah Mercado [id number] 07-11-1922  CARE TEAM:  PCP: Patient, No Pcp Per  Outpatient Care Team: Patient Care Team: Patient, No Pcp Per as PCP - General (General Practice) Curt Bears, Ocie Doyne, MD as PCP - Electrophysiology (Cardiology) Stark Klein, MD as Consulting Physician (Surgical Oncology)  Inpatient Treatment Team: Treatment Team: Attending Provider: Allie Bossier, MD; Technician: Jabier Gauss, NT; Consulting Physician: Nolon Nations, MD; Case Manager: Michel Santee, PT; Registered Nurse: Charyl Dancer, RN; Registered Nurse: Kennith Center, RN; Technician: Marletta Lor, NT; Rounding Team: Briscoe Deutscher, MD; Registered Nurse: Roselind Rily, RN; Technician: Gasper Sells, [place]; Technician: Corliss Marcus, [place]; Respiratory Therapist: Judith Part, RRT; Case Manager: Claudie Leach, RN   Problem List:   Principal Problem:   Small bowel obstruction [facility]) Active Problems:   Hypertension   Persistent atrial fibrillation ([place])   Pulmonary hypertension (HCC)   Protein-calorie malnutrition, severe   Vomiting   ARF (acute renal failure) (HCC)   SBO (small bowel obstruction) ([place])   Palliative care by specialist   Goals of care, counseling/discussion   Advanced care planning/counseling discussion   Acute renal failure superimposed on chronic kidney disease ([place])      * No surgery found *      Assessment   HTN CAD Pacemaker in place Pulmonary fibrosis/pulmonary HTN CKD A fib on eliquis (last dose 02/18/19) - please hold eliquis, ok for heparin gtt Right intertrochanteric femur fracture s/p IMN 02/17/19 by Dr. Lyla Glassing Hiatal hernia AKI  SBO vs Ileus -more likely ileus related to recent orthopedic surgery  [facility] Stay = 5 days)  Plan:  Patient pulled NG tube last night when she was confused.  She denies any nausea or pain.  Claims she has had flatus.  Her abdomen seems much softer today.  I discussed with  the patient and her nurse.  We will try and hold off on nasogastric tube for now.  Sips only.  Very low threshold to replace if she feels nauseated or full.  Awaiting up follow-up films.  I do not see worsening obstruction.  Should she not open up, may need abdominal exploration.  She is not toxic or sickly at this time.  Try and hold off.    - Started on erythromycin 250 mg IV every 6 for motility 12/3 >> day 2   ID -none VTE -SCDs, eliquison hold/ok for heparin gtt FEN -IVF, NPO/sips   Correct hypokalemia.  Check magnesium. ID: No definite infection but history of UTIs in the past with recent urine retention.  Send urine for culture just in case Anemia: Acute postoperative on top of chronic anemia.  Check anemia panel.  Gave IV iron . Foley -placed 12/1 for retention.  Consider removal.  INO cath as needed Follow up -TBD      LOS: 3 days   35 minutes spent in review, evaluation, examination, counseling, and coordination of care.  More than 50% of that time was spent in counseling.  02/25/2019    Subjective: (Chief complaint)  Confused.  Pulled NG tube.  Very apologetic.  "I am sorry.  You can put it back in if you need to, hon"  Denies any abdominal pain.  No nausea.  Claims to have flatus.  Objective:  Vital signs:  Vitals:   02/24/19 1446 02/24/19 2149 02/25/19 0500 02/25/19 0606  BP: (!) 113/54 (!) 120/51  (!) 104/58  Pulse: 63 63  62  Resp: 18 20  17  Temp: 98.2 F (36.8 C) 99.2 F (37.3 C)  99.3 F (37.4 C)  TempSrc: Oral Oral  Oral  SpO2: 100% 96%  94%  Weight:   59.7 kg   Height:        Last BM Date: 02/24/19  Intake/Output   Yesterday:  12/05 0701 - 12/06 0700 In: 2409.5 [I.V.:1736.3; IV Piggyback:673.3] Out: 2100 [Urine:750; Emesis/NG output:1350] This shift:  No intake/output data recorded.  Bowel function:  Flatus: YES  BM:  Scant  Drain:    Physical Exam:  General: Pt awake/alert/oriented x4 in no acute distress Eyes:  PERRL, normal EOM.  Sclera clear.  No icterus Neuro: CN II-XII intact w/o focal sensory/motor deficits. Lymph: No head/neck/groin lymphadenopathy Psych:  No delerium/psychosis/paranoia.  Mumbles somewhat but alert.  No agitation nor somnolence HENT: Normocephalic, Mucus membranes moist.  No thrush Neck: Supple, No tracheal deviation Chest: No chest wall pain w good excursion CV:  Pulses intact.  Regular rhythm MS: Normal AROM mjr joints.  No obvious deformity  Abdomen: Soft.  Mildy distended.  Nontender.  Softer today.  No evidence of peritonitis.  No incarcerated hernias.  Ext:  No deformity.  No mjr edema.  No cyanosis Skin: No petechiae / purpura  Results:   Cultures: Recent Results (from the past 720 hour(s))  Surgical pcr screen     Status: None   Collection Time: 02/15/19  3:24 PM   Specimen: Nasal Mucosa; Nasal Swab  Result Value Ref Range Status   MRSA, PCR NEGATIVE NEGATIVE Final   Staphylococcus aureus NEGATIVE NEGATIVE Final    Comment: (NOTE) The Xpert SA Assay (FDA approved for NASAL specimens in patients 54 years of age and older), is one component of a comprehensive surveillance program. It is not intended to diagnose infection nor to guide or monitor treatment. Performed at [facility], [place] [address] [place], [place] [address]   SARS CORONAVIRUS 2 (TAT 6-24 HRS) Nasopharyngeal Nasopharyngeal Swab     Status: None   Collection Time: 03/21/2019  2:32 PM   Specimen: Nasopharyngeal Swab  Result Value Ref Range Status   SARS Coronavirus 2 NEGATIVE NEGATIVE Final    Comment: (NOTE) SARS-CoV-2 target nucleic acids are NOT DETECTED. The SARS-CoV-2 RNA is generally detectable in upper and lower respiratory specimens during the acute phase of infection. Negative results do not preclude SARS-CoV-2 infection, do not rule out co-infections with other pathogens, and should not be used as the sole basis for treatment or other patient management decisions. Negative  results must be combined with clinical observations, patient history, and epidemiological information. The expected result is Negative. Fact Sheet for Patients: SugarRoll.be Fact Sheet for Healthcare Providers: https://www.woods-mathews.com/ This test is not yet approved or cleared by the [place] FDA and  has been authorized for detection and/or diagnosis of SARS-CoV-2 by FDA under an Emergency Use Authorization (EUA). This EUA will remain  in effect (meaning this test can be used) for the duration of the COVID-19 declaration under Section 56 4(b)(1) of the Act, 21 U.S.C. section 360bbb-3(b)(1), unless the authorization is terminated or revoked sooner. Performed at [facility], [place] [address], [place], [place] [address]   Urine culture     Status: Abnormal (Preliminary result)   Collection Time: 02/24/19  9:12 AM   Specimen: Urine, Catheterized  Result Value Ref Range Status   Specimen Description URINE, CATHETERIZED  Final   Special Requests   Final    Normal Performed at [facility], [address] [place], [place] [address]    Culture >=100,000 COLONIES/mL GRAM NEGATIVE RODS (A)  Final   Report Status PENDING  Incomplete    Labs: Results for orders placed or performed during the hospital encounter of 03/01/2019 (from the past 48 hour(s))  Glucose, capillary     Status: Abnormal   Collection Time: 02/23/19 11:36 AM  Result Value Ref Range   Glucose-Capillary 140 (H) 70 - 99 mg/dL  Glucose, capillary     Status: Abnormal   Collection Time: 02/23/19  5:23 PM  Result Value Ref Range   Glucose-Capillary 123 (H) 70 - 99 mg/dL  Glucose, capillary     Status: Abnormal   Collection Time: 02/23/19 11:23 PM  Result Value Ref Range   Glucose-Capillary 151 (H) 70 - 99 mg/dL  Glucose, capillary     Status: Abnormal   Collection Time: 02/23/19 11:49 PM  Result Value Ref Range   Glucose-Capillary 153 (H) 70 - 99 mg/dL  CBC      Status: Abnormal   Collection Time: 02/24/19  2:12 AM  Result Value Ref Range   WBC 16.8 (H) 4.0 - 10.5 K/uL   RBC 2.45 (L) 3.87 - 5.11 MIL/uL   Hemoglobin 8.1 (L) 12.0 - 15.0 g/dL   HCT 26.3 (L) 36.0 - 46.0 %   MCV 107.3 (H) 80.0 - 100.0 fL   MCH 33.1 26.0 - 34.0 pg   MCHC 30.8 30.0 - 36.0 g/dL   RDW 14.5 11.5 - 15.5 %   Platelets 301 150 - 400 K/uL   nRBC 0.5 (H) 0.0 - 0.2 %    Comment: Performed at [facility], [address], [place], [place] [address]  Basic metabolic panel     Status: Abnormal   Collection Time: 02/24/19  2:12 AM  Result Value Ref Range   Sodium 147 (H) 135 - 145 mmol/L   Potassium 3.5 3.5 - 5.1 mmol/L   Chloride 106 98 - 111 mmol/L   CO2 31 22 - 32 mmol/L   Glucose, Bld 162 (H) 70 - 99 mg/dL   BUN 48 (H) 8 - 23 mg/dL   Creatinine, Ser 0.97 0.44 - 1.00 mg/dL   Calcium 7.6 (L) 8.9 - 10.3 mg/dL   GFR calc non Af Amer 50 (L) >60 mL/min   GFR calc Af Amer 58 (L) >60 mL/min   Anion gap 10 5 - 15    Comment: Performed at [facility] [address] [place], [place] [address]  Magnesium - add on     Status: Abnormal   Collection Time: 02/24/19  2:12 AM  Result Value Ref Range   Magnesium 2.7 (H) 1.7 - 2.4 mg/dL    Comment: Performed at [facility] [address] [place], [place] [address]  Glucose, capillary     Status: Abnormal   Collection Time: 02/24/19  5:20 AM  Result Value Ref Range   Glucose-Capillary 130 (H) 70 - 99 mg/dL  Urine culture     Status: Abnormal (Preliminary result)   Collection Time: 02/24/19  9:12 AM   Specimen: Urine, Catheterized  Result Value Ref Range   Specimen Description URINE, CATHETERIZED    Special Requests      Normal Performed at [facility], [place] [address] [place], [place] [address]    Culture >=100,000 COLONIES/mL GRAM NEGATIVE RODS (A)    Report Status PENDING   Glucose, capillary     Status: Abnormal   Collection  Time: 02/24/19 12:03 PM  Result Value Ref Range    Glucose-Capillary 101 (H) 70 - 99 mg/dL   Comment 1 Notify RN    Comment 2 Document in Chart   CBC     Status: Abnormal   Collection Time: 02/25/19  2:48 AM  Result Value Ref Range   WBC 22.5 (H) 4.0 - 10.5 K/uL   RBC 2.31 (L) 3.87 - 5.11 MIL/uL   Hemoglobin 7.7 (L) 12.0 - 15.0 g/dL   HCT 25.3 (L) 36.0 - 46.0 %   MCV 109.5 (H) 80.0 - 100.0 fL   MCH 33.3 26.0 - 34.0 pg   MCHC 30.4 30.0 - 36.0 g/dL   RDW 14.9 11.5 - 15.5 %   Platelets 317 150 - 400 K/uL   nRBC 0.5 (H) 0.0 - 0.2 %    Comment: Performed at [facility] [address] [place], [place] [address]  Basic metabolic panel     Status: Abnormal   Collection Time: 02/25/19  2:48 AM  Result Value Ref Range   Sodium 146 (H) 135 - 145 mmol/L   Potassium 3.8 3.5 - 5.1 mmol/L   Chloride 106 98 - 111 mmol/L   CO2 29 22 - 32 mmol/L   Glucose, Bld 126 (H) 70 - 99 mg/dL   BUN 42 (H) 8 - 23 mg/dL   Creatinine, Ser 0.97 0.44 - 1.00 mg/dL   Calcium 7.6 (L) 8.9 - 10.3 mg/dL   GFR calc non Af Amer 50 (L) >60 mL/min   GFR calc Af Amer 58 (L) >60 mL/min   Anion gap 11 5 - 15    Comment: Performed at [facility] [address] [place], [place] [address]  Vitamin B12     Status: Abnormal   Collection Time: 02/25/19  2:48 AM  Result Value Ref Range   Vitamin B-12 1,895 (H) 180 - 914 pg/mL    Comment: (NOTE) This assay is not validated for testing neonatal or myeloproliferative syndrome specimens for Vitamin B12 levels. Performed at [facility], [place] [address] [place], [place] [address]   Folate     Status: None   Collection Time: 02/25/19  2:48 AM  Result Value Ref Range   Folate 30.3 >5.9 ng/mL    Comment: Performed at [facility] [address] [place], [place] [address]  Iron and TIBC     Status: Abnormal   Collection Time: 02/25/19  2:48 AM  Result Value Ref Range   Iron 180 (H) 28 - 170 ug/dL   TIBC 207 (L) 250 - 450 ug/dL   Saturation Ratios 87 (H) 10.4 - 31.8 %   UIBC 27 ug/dL     Comment: Performed at [facility] [address], [place], [place] [address]  Ferritin     Status: None   Collection Time: 02/25/19  2:48 AM  Result Value Ref Range   Ferritin 204 11 - 307 ng/mL    Comment: Performed at [facility], [place] [address] [place], [place] [address]  Reticulocytes     Status: Abnormal   Collection Time: 02/25/19  2:48 AM  Result Value Ref Range   Retic Ct Pct 5.6 (H) 0.4 - 3.1 %   RBC. 2.31 (L) 3.87 - 5.11 MIL/uL   Retic Count, Absolute 128.4 19.0 - 186.0 K/uL   Immature Retic Fract 27.6 (H) 2.3 - 15.9 %    Comment: Performed at [facility],  [address] [place], [place] [address]  Glucose, capillary     Status: Abnormal   Collection Time: 02/25/19  2:48 AM  Result Value Ref Range   Glucose-Capillary 124 (H) 70 - 99 mg/dL  Glucose, capillary     Status: Abnormal   Collection Time: 02/25/19  6:02 AM  Result Value Ref Range   Glucose-Capillary 124 (H) 70 - 99 mg/dL  Lactic acid, plasma     Status: None   Collection Time: 02/25/19  9:19 AM  Result Value Ref Range   Lactic Acid, Venous 1.4 0.5 - 1.9 mmol/L    Comment: Performed at [facility], [place] [address], [place], [place] [address]    Imaging / Studies: Dg Abd Portable 1v-small Bowel Obstruction Protocol-initial, 8 Hr Delay  Result Date: 02/23/2019 CLINICAL DATA:  85 year old female with small bowel obstruction follow-up. EXAM: PORTABLE ABDOMEN - 1 VIEW COMPARISON:  Earlier radiograph dated 02/23/2019. FINDINGS: Stable positioning of the NG tube which appears kinked just distal to the side port. Persistent dilatation of small-bowel measuring up to 4.5 cm in caliber. Degenerative changes of the spine. IMPRESSION: 1. Persistent small bowel dilatation. 2. Stable appearance and positioning of the NG tube as above. Electronically Signed   By: Anner Crete M.D.   On: 02/23/2019 23:03    Medications / Allergies: per chart  Antibiotics: Anti-infectives (From admission,  onward)   Start     Dose/Rate Route Frequency Ordered Stop   02/22/19 1230  erythromycin 250 mg in sodium chloride 0.9 % 100 mL IVPB     250 mg 100 mL/hr over 60 Minutes Intravenous Every 6 hours 02/22/19 1130          Note: Portions of this report may have been transcribed using voice recognition software. Every effort was made to ensure accuracy; however, inadvertent computerized transcription errors may be present.   Any transcriptional errors that result from this process are unintentional.     Adin Hector, MD, FACS, MASCRS Gastrointestinal and Minimally Invasive Surgery    [address], [place], [place] [address] [phone] Main / Paging [phone] Fax

## 2019-02-25 NOTE — Plan of Care (Signed)
  Problem: Clinical Measurements: Goal: Diagnostic test results will improve Outcome: Progressing Goal: Respiratory complications will improve Outcome: Progressing Goal: Cardiovascular complication will be avoided Outcome: Progressing   Problem: Coping: Goal: Level of anxiety will decrease Outcome: Progressing   

## 2019-02-25 NOTE — Progress Notes (Addendum)
Pt NG tube accidentally pulled out by pt. Notified on call doctor  C. Bodenheimer via text page.

## 2019-02-26 ENCOUNTER — Inpatient Hospital Stay (HOSPITAL_COMMUNITY): Payer: Medicare Other

## 2019-02-26 DIAGNOSIS — I272 Pulmonary hypertension, unspecified: Secondary | ICD-10-CM

## 2019-02-26 LAB — URINE CULTURE
Culture: >=100000 — AB
Special Requests: NORMAL

## 2019-02-26 LAB — BASIC METABOLIC PANEL
Anion gap: 11 (ref 5–15)
BUN: 34 mg/dL — ABNORMAL HIGH (ref 8–23)
CO2: 28 mmol/L (ref 22–32)
Calcium: 7.5 mg/dL — ABNORMAL LOW (ref 8.9–10.3)
Chloride: 109 mmol/L (ref 98–111)
Creatinine, Ser: 0.92 mg/dL (ref 0.44–1.00)
GFR calc Af Amer: >60 mL/min (ref >60)
GFR calc non Af Amer: 53 mL/min — ABNORMAL LOW (ref >60)
Glucose, Bld: 123 mg/dL — ABNORMAL HIGH (ref 70–99)
Potassium: 3.9 mmol/L (ref 3.5–5.1)
Sodium: 148 mmol/L — ABNORMAL HIGH (ref 135–145)

## 2019-02-26 LAB — CBC
HCT: 26.1 % — ABNORMAL LOW (ref 36.0–46.0)
Hemoglobin: 7.7 g/dL — ABNORMAL LOW (ref 12.0–15.0)
MCH: 32.8 pg (ref 26.0–34.0)
MCHC: 29.5 g/dL — ABNORMAL LOW (ref 30.0–36.0)
MCV: 111.1 fL — ABNORMAL HIGH (ref 80.0–100.0)
Platelets: 361 10*3/uL (ref 150–400)
RBC: 2.35 MIL/uL — ABNORMAL LOW (ref 3.87–5.11)
RDW: 15.4 % (ref 11.5–15.5)
WBC: 21.9 10*3/uL — ABNORMAL HIGH (ref 4.0–10.5)
nRBC: 1.1 % — ABNORMAL HIGH (ref 0.0–0.2)

## 2019-02-26 LAB — GLUCOSE, CAPILLARY
Glucose-Capillary: 105 mg/dL — ABNORMAL HIGH (ref 70–99)
Glucose-Capillary: 128 mg/dL — ABNORMAL HIGH (ref 70–99)
Glucose-Capillary: 133 mg/dL — ABNORMAL HIGH (ref 70–99)
Glucose-Capillary: 98 mg/dL (ref 70–99)

## 2019-02-26 LAB — BRAIN NATRIURETIC PEPTIDE: B Natriuretic Peptide: 874.2 pg/mL — ABNORMAL HIGH (ref 0.0–100.0)

## 2019-02-26 LAB — SARS CORONAVIRUS 2 (TAT 6-24 HRS): SARS Coronavirus 2: NEGATIVE

## 2019-02-26 LAB — PROCALCITONIN: Procalcitonin: 0.82 ng/mL

## 2019-02-26 MED ORDER — FUROSEMIDE 10 MG/ML IJ SOLN
40.0000 mg | Freq: Once | INTRAMUSCULAR | Status: AC
  Administered 2019-02-26: 40 mg via INTRAVENOUS
  Filled 2019-02-26: qty 4

## 2019-02-26 NOTE — Progress Notes (Signed)
Pt refused to put it back the NGT tube but her son said if we can give like ativan before put a new ngt, I paged on call MD for [facility].

## 2019-02-26 NOTE — Progress Notes (Signed)
Inpatient Rehab Admissions Coordinator:   Pt sleeping soundly so I did not wake her.  Note from surgery team indicates NG tube to be replaced today and possible surgery pending improvement of ileus.   Shann Medal, PT, DPT Admissions Coordinator [phone] 02/26/19  11:55 AM

## 2019-02-26 NOTE — Progress Notes (Signed)
Pt NGT was placed and after taking x-ray , pt vomited and the tube came out thru the mouth, I removed it and pt said I don't want it back Dr. Bonner Puna informed.

## 2019-02-26 NOTE — Progress Notes (Addendum)
[facility] Progress Note     Subjective: CC-  Up in chair. Complaining of abdominal distension but denies any abdominal pain. She had a couple episodes of nausea and vomiting yesterday. She does state that she passed a lot of gas yesterday and had a small BM. Thinks she has passed a little gas today.  Objective: Vital signs in last 24 hours: Temp:  [97.4 F (36.3 C)-98.6 F (37 C)] 98.6 F (37 C) (12/07 0443) Pulse Rate:  [63-64] 63 (12/07 0443) Resp:  [15-24] 16 (12/07 0443) BP: (111-115)/(53-55) 112/55 (12/07 0443) SpO2:  [93 %-98 %] 93 % (12/07 0443) Weight:  [59.9 kg] 59.9 kg (12/07 0500) Last BM Date: 02/25/19  Intake/Output from previous day: 12/06 0701 - 12/07 0700 In: 837.5 [I.V.:737.5; IV Piggyback:100] Out: 700 [Urine:600; Emesis/NG output:100] Intake/Output this shift: No intake/output data recorded.  PE: Gen:  Alert, NAD, pleasant HEENT: EOM's intact, pupils equal and round Card:  RRR Pulm:  CTAB, no W/R/R, rate and effort normal Abd: Soft, distended, nontender, +BS, no HSM Skin: warm and dry  Lab Results:  Recent Labs    02/25/19 0248 02/26/19 0226  WBC 22.5* 21.9*  HGB 7.7* 7.7*  HCT 25.3* 26.1*  PLT 317 361   BMET Recent Labs    02/25/19 0919 02/26/19 0226  NA 145 148*  K 3.8 3.9  CL 109 109  CO2 29 28  GLUCOSE 132* 123*  BUN 38* 34*  CREATININE 0.96 0.92  CALCIUM 7.6* 7.5*   PT/INR No results for input(s): LABPROT, INR in the last 72 hours. CMP     Component Value Date/Time   NA 148 (H) 02/26/2019 0226   K 3.9 02/26/2019 0226   CL 109 02/26/2019 0226   CO2 28 02/26/2019 0226   GLUCOSE 123 (H) 02/26/2019 0226   BUN 34 (H) 02/26/2019 0226   CREATININE 0.92 02/26/2019 0226   CREATININE 1.08 (H) 10/09/2015 1130   CALCIUM 7.5 (L) 02/26/2019 0226   PROT 5.5 (L) 02/25/2019 0919   ALBUMIN 2.3 (L) 02/25/2019 0919   AST 41 02/25/2019 0919   ALT 25 02/25/2019 0919   ALKPHOS 60 02/25/2019 0919   BILITOT 1.2 02/25/2019  0919   GFRNONAA 53 (L) 02/26/2019 0226   GFRAA >60 02/26/2019 0226   Lipase  No results found for: LIPASE     Studies/Results: Dg Abd Acute W/chest  Result Date: 02/25/2019 CLINICAL DATA:  Postoperative ileus, small bowel obstruction EXAM: DG ABDOMEN ACUTE W/ 1V CHEST COMPARISON:  Abdominal radiograph dated 02/23/2019 and CT abdomen pelvis dated 02/22/2019 FINDINGS: A nasogastric tube has been removed. A left subclavian approach cardiac device is unchanged. The heart remains enlarged. Right greater than left bibasilar atelectasis/airspace disease appears increased on the right and unchanged on the left since 02/21/2019. A small right pleural effusion may contribute. There is no pneumothorax. A few gas-filled loops of small bowel are seen in the lower abdomen, measuring up to 4.5 cm in diameter. This is not significantly changed since 02/23/2019. Degenerative changes are seen in the spine. Fixation hardware is seen in the proximal right femur. IMPRESSION: 1. Persistent gas-filled loops of small bowel in the lower abdomen, measuring up to 4.5 cm in diameter. Findings are consistent with persistent small bowel obstruction. 2. Interval removal of nasogastric tube. 3. Bibasilar atelectasis/airspace disease, right greater than left. Electronically Signed   By: Zerita Boers M.D.   On: 02/25/2019 14:03    Anti-infectives: Anti-infectives (From admission, onward)   Start  Dose/Rate Route Frequency Ordered Stop   02/25/19 1445  cefTRIAXone (ROCEPHIN) 1 g in sodium chloride 0.9 % 100 mL IVPB     1 g 200 mL/hr over 30 Minutes Intravenous Every 24 hours 02/25/19 1441     02/22/19 1230  erythromycin 250 mg in sodium chloride 0.9 % 100 mL IVPB     250 mg 100 mL/hr over 60 Minutes Intravenous Every 6 hours 02/22/19 1130         Assessment/Plan HTN CAD Pacemaker in place Pulmonary fibrosis/pulmonary HTN CKD A fib on eliquis (last dose 02/18/19) - please hold eliquis, ok for heparin gtt Right  intertrochanteric femur fracture s/p IMN 02/17/19 by Dr. Lyla Glassing Hiatal hernia AKI UTI - Ucx grew Serratia marcescens, on rocephin 12/6>>  SBO vs Ileus - initially felt this more likely ileus related to recent orthopedic surgery - multiple prior abdominal surgeries: Laparoscopic Left oophorectomy and low anterior resection 02/08/2011 by Dr. Barry Dienes for rectal cancer; appendectomy; surgery for ectopic pregnancy; vaginal hysterectomy  - Started on erythromycin 250 mg IV every 6 for motility 12/3 >>   ID -none VTE -SCDs, eliquison hold/ok for heparin gtt FEN -IVF, NPO Foley -placed 12/1 for retention Follow up -TBD  Plan: Patient does not look ill and she is having some bowel function, but she is still very distended. Xray shows persistent loops of small bowel. Replace NG tube. If she does not improve with NG decompression she may need surgery.   LOS: 6 days    [facility] 02/26/2019, 10:03 AM Please see Amion for pager number during day hours 7:00am-4:30pm

## 2019-02-26 NOTE — Progress Notes (Signed)
PROGRESS NOTE  Hannah Mercado  JKD:[id number] DOB: [birth year] DOA: 03/20/2019 PCP: Patient, No Pcp Per   Brief Narrative: Hannah Mercado is a 85 y.o. female with a history of multiple abdominal surgeries, Chronic AFib s/p PPM, CAD, HTN, HLD, pulmonary fibrosis, rectal CA s/p resection, and recent fall s/p right hip IM nail 02/17/2019. She was discharged to [facility] after orthopedic surgery but returned 12/1 after an episode of coffee-ground emesis, abdominal pain found to have significant dilatation of stomach, duodenum, and jejunum with transition point in the right pelvis on CT scan. She was admitted with working diagnosis of SBO vs. ileus and acute renal failure.   Assessment & Plan: Principal Problem:   Small bowel obstruction (HCC) Active Problems:   Hypertension   Persistent atrial fibrillation (HCC)   Pulmonary hypertension (HCC)   Protein-calorie malnutrition, severe   Vomiting   ARF (acute renal failure) (HCC)   SBO (small bowel obstruction) ([place])   Palliative care by specialist   Goals of care, counseling/discussion   Advanced care planning/counseling discussion   Acute renal failure superimposed on chronic kidney disease ([place])  Small bowel obstruction vs. post-operative ileus:  - Management per surgery. NG tube replaced today, redundant in esophagus, needs replacement. RN paged notifying of pt refusal to reinsert, asked to page surgery. - Continuing erythromycin started 12/3.   Acute hypoxic respiratory failure with multifocal pneumonia and acute pulmonary edema: Was given IVF due to renal failure and hypoxia has remained stable. She is afebrile without cough, though CXR findings on my personal assessment from today demonstrate opacities bilaterally. Vascular congestion, presence of pleural effusions, and lack of typical lab findings (lymphopenia, transaminitis, thrombocytopenia, etc.) make covid unlikely, though this will be ruled out.  - Give lasix x1, stop  IVF. - Ceftriaxone and macrolide as already started - R/o SARS-CoV-2  Serratia UTI: Sepsis ruled out.  - Start ceftriaxone per susceptibility data.  - Trend leukocytosis which has been increasing since admission.  Acute renal failure on stage IIIb CKD: Resolved, now at presumed baseline.  - Avoid nephrotoxins, hypotension - Monitor Cr, UOP.   Anemia of chronic kidney disease complicated by acute blood loss anemia s/p surgery 11/28: Stable/slightly lower than baseline in the absence of ongoing gross bleeding. - Continue monitoring with transfusion threshold of 7g/dl.   Chronic atrial fibrillation, MVP: V paced rhythm noted on telemetry with 3 beats NSVT on 12/6.  - Continue telemetry for now. - Rate is controlled off medications.   Hypernatremia:  - Lasix x1, stop IVF today, monitor.  Hyperkalemia: Resolved.  - Monitor  CAD:  - Continue meds once no longer NPO  HTN:  - Continue meds once no longer NPO  History of rectal cancer s/p colectomy.   History of right hip fracture s/p IM nail 02/17/2019:  - Continue PT/OT, possibly DC back to CIR pending clinical course.  DVT prophylaxis: SCDs Code Status: DNR confirmed Family Communication: None at bedside Disposition Plan: Uncertain  Consultants:   General surgery  Palliative care medicine team  Procedures:   None  Antimicrobials:  Erythromycin  Ceftriaxone   Subjective: Abdomen got more painful this morning, at time of my exam, denies abdominal pain, distention about the same. After my exam, reported escalating pain, nausea, had bilious emesis with NG tube attempt.  Objective: Vitals:   02/25/19 2019 02/26/19 0443 02/26/19 0500 02/26/19 0930  BP: (!) 111/53 (!) 112/55    Pulse: 64 63    Resp: 15 16    Temp: 98.1  F (36.7 C) 98.6 F (37 C)    TempSrc: Oral Oral    SpO2: 98% 93%  91%  Weight:   59.9 kg   Height:        Intake/Output Summary (Last 24 hours) at 02/26/2019 1429 Last data filed at  02/26/2019 0450 Gross per 24 hour  Intake 837.5 ml  Output 700 ml  Net 137.5 ml   Filed Weights   02/24/19 0524 02/25/19 0500 02/26/19 0500  Weight: 58.2 kg 59.7 kg 59.9 kg    Gen: Elderly female in no distress Pulm: Non-labored breathing 1LPM. Crackles bilaterally.  CV: Regular rate and rhythm. No murmur, rub, or gallop. No JVD, Trace pedal edema. GI: Abdomen soft, some distention in L > R, minimally tender without rebound or guarding. Hypoactive BS. Ext: Warm, no deformities Skin: No rashes, lesions or ulcers Neuro: Alert and oriented. No focal neurological deficits. Psych: Judgement and insight appear normal. Mood & affect appropriate.   Data Reviewed: I have personally reviewed following labs and imaging studies  CBC: Recent Labs  Lab 03/20/2019 0154  02/22/19 0945 02/23/19 0204 02/24/19 0212 02/25/19 0248 02/26/19 0226  WBC 7.3   < > 10.9* 12.0* 16.8* 22.5* 21.9*  NEUTROABS 5.8  --   --   --   --   --   --   HGB 9.4*   < > 8.1* 7.9* 8.1* 7.7* 7.7*  HCT 28.0*   < > 25.8* 25.0* 26.3* 25.3* 26.1*  MCV 102.6*   < > 108.4* 107.8* 107.3* 109.5* 111.1*  PLT 262   < > 268 271 301 317 361   < > = values in this interval not displayed.   Basic Metabolic Panel: Recent Labs  Lab 02/21/19 0301  02/23/19 0204 02/24/19 0212 02/25/19 0248 02/25/19 0919 02/26/19 0226  NA 137   < > 144 147* 146* 145 148*  K 4.5   < > 3.2* 3.5 3.8 3.8 3.9  CL 92*   < > 103 106 106 109 109  CO2 32   < > 30 31 29 29 28   GLUCOSE 147*   < > 158* 162* 126* 132* 123*  BUN 106*   < > 62* 48* 42* 38* 34*  CREATININE 2.65*   < > 1.08* 0.97 0.97 0.96 0.92  CALCIUM 7.4*   < > 7.3* 7.6* 7.6* 7.6* 7.5*  MG 2.5*  --   --  2.7*  --   --   --    < > = values in this interval not displayed.   GFR: Estimated Creatinine Clearance: 35.4 mL/min (by C-G formula based on SCr of 0.92 mg/dL). Liver Function Tests: Recent Labs  Lab 03/07/2019 0154 02/22/19 1121 02/25/19 0919  AST 25 36 41  ALT 7 12 25     ALKPHOS 48 47 60  BILITOT 1.5* 1.8* 1.2  PROT 7.1 5.7* 5.5*  ALBUMIN 3.1* 2.5* 2.3*   No results for input(s): LIPASE, AMYLASE in the last 168 hours. No results for input(s): AMMONIA in the last 168 hours. Coagulation Profile: No results for input(s): INR, PROTIME in the last 168 hours. Cardiac Enzymes: Recent Labs  Lab 02/25/19 0919  CKTOTAL 71  CKMB 1.8   BNP (last 3 results) No results for input(s): PROBNP in the last 8760 hours. HbA1C: No results for input(s): HGBA1C in the last 72 hours. CBG: Recent Labs  Lab 02/25/19 1210 02/25/19 1747 02/25/19 2358 02/26/19 0608 02/26/19 1209  GLUCAP 124* 112* 98 105* 133*   Lipid  Profile: No results for input(s): CHOL, HDL, LDLCALC, TRIG, CHOLHDL, LDLDIRECT in the last 72 hours. Thyroid Function Tests: No results for input(s): TSH, T4TOTAL, FREET4, T3FREE, THYROIDAB in the last 72 hours. Anemia Panel: Recent Labs    02/25/19 0248  VITAMINB12 1,895*  FOLATE 30.3  FERRITIN 204  TIBC 207*  IRON 180*  RETICCTPCT 5.6*   Urine analysis:    Component Value Date/Time   COLORURINE YELLOW 08/24/2016 1020   APPEARANCEUR CLEAR 08/24/2016 1020   LABSPEC 1.025 08/24/2016 1020   PHURINE 5.5 08/24/2016 1020   GLUCOSEU NEGATIVE 08/24/2016 1020   HGBUR NEGATIVE 08/24/2016 1020   BILIRUBINUR NEGATIVE 08/24/2016 1020   KETONESUR NEGATIVE 08/24/2016 1020   PROTEINUR NEGATIVE 02/01/2011 1545   UROBILINOGEN 0.2 08/24/2016 1020   NITRITE POSITIVE (A) 08/24/2016 1020   LEUKOCYTESUR MODERATE (A) 08/24/2016 1020   Recent Results (from the past 240 hour(s))  SARS CORONAVIRUS 2 (TAT 6-24 HRS) Nasopharyngeal Nasopharyngeal Swab     Status: None   Collection Time: 03/07/2019  2:32 PM   Specimen: Nasopharyngeal Swab  Result Value Ref Range Status   SARS Coronavirus 2 NEGATIVE NEGATIVE Final    Comment: (NOTE) SARS-CoV-2 target nucleic acids are NOT DETECTED. The SARS-CoV-2 RNA is generally detectable in upper and lower respiratory  specimens during the acute phase of infection. Negative results do not preclude SARS-CoV-2 infection, do not rule out co-infections with other pathogens, and should not be used as the sole basis for treatment or other patient management decisions. Negative results must be combined with clinical observations, patient history, and epidemiological information. The expected result is Negative. Fact Sheet for Patients: SugarRoll.be Fact Sheet for Healthcare Providers: https://www.woods-mathews.com/ This test is not yet approved or cleared by the [place] FDA and  has been authorized for detection and/or diagnosis of SARS-CoV-2 by FDA under an Emergency Use Authorization (EUA). This EUA will remain  in effect (meaning this test can be used) for the duration of the COVID-19 declaration under Section 56 4(b)(1) of the Act, 21 U.S.C. section 360bbb-3(b)(1), unless the authorization is terminated or revoked sooner. Performed at [facility], [place] [address] [place], [place] [address]   Urine culture     Status: Abnormal   Collection Time: 02/24/19  9:12 AM   Specimen: Urine, Catheterized  Result Value Ref Range Status   Specimen Description URINE, CATHETERIZED  Final   Special Requests   Final    Normal Performed at [facility], [place] [address] [place], [place] [address]    Culture (A)  Final    >=100,000 COLONIES/mL SERRATIA MARCESCENS 60,000 COLONIES/mL YEAST    Report Status 02/26/2019 FINAL  Final   Organism ID, Bacteria SERRATIA MARCESCENS (A)  Final      Susceptibility   Serratia marcescens - MIC*    CEFAZOLIN >=64 RESISTANT Resistant     CEFTRIAXONE <=1 SENSITIVE Sensitive     CIPROFLOXACIN <=0.25 SENSITIVE Sensitive     GENTAMICIN <=1 SENSITIVE Sensitive     NITROFURANTOIN 128 RESISTANT Resistant     TRIMETH/SULFA <=20 SENSITIVE Sensitive     * >=100,000 COLONIES/mL SERRATIA MARCESCENS      Radiology Studies: Dg  Abd 1 View  Result Date: 02/26/2019 CLINICAL DATA:  Nasogastric tube placement EXAM: ABDOMEN - 1 VIEW COMPARISON:  Portable exam 1305 hours compared to 1051 hours FINDINGS: Tip of nasogastric tube is above the carina and projects over the trachea, uncertain if within esophagus or airway. Enlargement of cardiac silhouette post pacemaker. Nonobstructive  bowel gas pattern. Bones demineralized with compression deformities of T11, L2, L3, L4. IMPRESSION: Tip of nasogastric tube projects over the trachea above the carina, uncertain if within esophagus or trachea; recommend removal and replacement. Normal bowel gas pattern. Findings called to [facility] on 6N on 02/26/2019 at 1340 hours. Electronically Signed   By: Lavonia Dana M.D.   On: 02/26/2019 13:42   Dg Chest Port 1 View  Result Date: 02/26/2019 CLINICAL DATA:  85 year old female with history of NG tube placement. EXAM: PORTABLE CHEST 1 VIEW COMPARISON:  Chest x-ray 02/15/2019. FINDINGS: Tip of nasogastric tube in the mid esophagus shortly beyond the level of the thoracic inlet. Redundant tubing appears coiled in the upper esophagus. Bibasilar opacities which may reflect areas of atelectasis and/or consolidation. Patchy multifocal areas of interstitial prominence an ill-defined airspace disease in the lungs bilaterally (right greater than left), concerning for multilobar pneumonia. Small to moderate bilateral pleural effusions increased compared to the prior study. Mild cardiomegaly. Cephalization of the pulmonary vasculature. Aortic atherosclerosis. Left-sided pacemaker device in place with lead tips projecting over the expected location of the right ventricular apex. IMPRESSION: 1. Nasogastric tube tip is in the mid esophagus with redundant portion of the tube coiled in the proximal esophagus. 2. Marked worsening aeration throughout the lungs bilaterally, with asymmetrically distributed areas of interstitial prominence and patchy multifocal ill-defined  airspace disease concerning for developing multilobar bilateral pneumonia, with small to moderate bilateral pleural effusions which have increased compared to the prior examination. 3. Cardiomegaly with cephalization of the pulmonary vasculature, suggesting some degree of developing congestive heart failure. 4. Aortic atherosclerosis. Electronically Signed   By: Vinnie Langton M.D.   On: 02/26/2019 13:43   Dg Abd Acute W/chest  Result Date: 02/25/2019 CLINICAL DATA:  Postoperative ileus, small bowel obstruction EXAM: DG ABDOMEN ACUTE W/ 1V CHEST COMPARISON:  Abdominal radiograph dated 02/23/2019 and CT abdomen pelvis dated 02/25/2019 FINDINGS: A nasogastric tube has been removed. A left subclavian approach cardiac device is unchanged. The heart remains enlarged. Right greater than left bibasilar atelectasis/airspace disease appears increased on the right and unchanged on the left since 03/11/2019. A small right pleural effusion may contribute. There is no pneumothorax. A few gas-filled loops of small bowel are seen in the lower abdomen, measuring up to 4.5 cm in diameter. This is not significantly changed since 02/23/2019. Degenerative changes are seen in the spine. Fixation hardware is seen in the proximal right femur. IMPRESSION: 1. Persistent gas-filled loops of small bowel in the lower abdomen, measuring up to 4.5 cm in diameter. Findings are consistent with persistent small bowel obstruction. 2. Interval removal of nasogastric tube. 3. Bibasilar atelectasis/airspace disease, right greater than left. Electronically Signed   By: Zerita Boers M.D.   On: 02/25/2019 14:03   Dg Abd Portable 1v  Result Date: 02/26/2019 CLINICAL DATA:  Abdominal pain and distension. Small bowel obstruction. EXAM: PORTABLE ABDOMEN - 1 VIEW COMPARISON:  Abdominal pain 02/25/2019. FINDINGS: There is continued gaseous distention of small bowel up to 5.6 cm. Gas and stool are again seen in the colon. IMPRESSION: No change in  gaseous distention of small bowel compatible with obstruction. Electronically Signed   By: Inge Rise M.D.   On: 02/26/2019 11:15    Scheduled Meds:  bisacodyl  10 mg Rectal Daily   Chlorhexidine Gluconate Cloth  6 each Topical Daily   lip balm  1 application Topical BID   pantoprazole (PROTONIX) IV  40 mg Intravenous Q12H   Continuous Infusions:  cefTRIAXone (ROCEPHIN)  IV 1 g (02/25/19 1500)   dextrose 5 % and 0.45 % NaCl with KCl 20 mEq/L 75 mL/hr at 02/26/19 0510   erythromycin 250 mg (02/26/19 0511)     LOS: 6 days   Time spent: 35 minutes.  Patrecia Pour, MD [facility] [email or web address] 02/26/2019, 2:29 PM

## 2019-02-26 NOTE — Progress Notes (Signed)
Physical Therapy Treatment Patient Details Name: Hannah Mercado MRN: [id number] DOB: 1922/11/05 Today's Date: 02/26/2019    History of Present Illness 85yo female s/p R hip IM nail placement 02/17/19, was transferred to CIR then had 679mL coffee ground emesis with return to acute with SBO and ARF. PMH A-fib, s/p PPM, CAD, HLD, HTN, mitral valve disorder, pulmonary fibrosis, rectal CA, colon resection, peripheral neuropathy    PT Comments    Pt pleasant finishing bath during session with NT. Pt with SpO2 91% on 2L with HR 66 and increased time to achieve pulse ox reading. Pt reports no abdominal pain and only once attempting to stand did she state Rt hip pain but so intense that it was terrible and made movement unbearable with pt educated for need for pain medication to allow further mobility. Pt able to tolerate HEP with AAROM on RLE and encouraged to discuss pain management with nurse and MD and increase mobility with staff. Will continue to follow.     Follow Up Recommendations  CIR;Supervision/Assistance - 24 hour     Equipment Recommendations  Rolling walker with 5" wheels;3in1 (PT)    Recommendations for Other Services       Precautions / Restrictions Precautions Precautions: Fall Precaution Comments: watch O2 sats, knees buckle Restrictions RLE Weight Bearing: Weight bearing as tolerated    Mobility  Bed Mobility Overal bed mobility: Needs Assistance Bed Mobility: Supine to Sit     Supine to sit: Mod assist     General bed mobility comments: mod assist to EOB with increased time, effort and cues for use of rail .  Transfers Overall transfer level: Needs assistance   Transfers: Sit to/from Stand;Stand Pivot Transfers Sit to Stand: Min assist;+2 safety/equipment Stand pivot transfers: +2 physical assistance;Mod assist       General transfer comment: pt able to rise with increased cues and assist from bed. Once in standing cues for taking steps and  attempting gait with chair follow. However, pt maintains flexed trunk with RW too anterior. partial knee buckling with pt pivoting to chair and refusing further standing due to RLE pain  Ambulation/Gait             General Gait Details: unable due to pain   Stairs             Wheelchair Mobility    Modified Rankin (Stroke Patients Only)       Balance Overall balance assessment: Needs assistance Sitting-balance support: Feet supported;Bilateral upper extremity supported Sitting balance-Leahy Scale: Fair     Standing balance support: Bilateral upper extremity supported;During functional activity Standing balance-Leahy Scale: Poor Standing balance comment: reliant on RW and additional external support                            Cognition Arousal/Alertness: Awake/alert Behavior During Therapy: WFL for tasks assessed/performed Overall Cognitive Status: Impaired/Different from baseline Area of Impairment: Problem solving                             Problem Solving: Slow processing General Comments: pt reporting pain untolerable with movement of RLE, pt with no pain meds and states she refuses them when offered. Pt educated for need for pain control to allow mobility advancement      Exercises General Exercises - Lower Extremity Long Arc Quad: AAROM;AROM;Right;Left;Seated;15 reps(AAROM on RLE) Hip ABduction/ADduction: AROM;Both;Seated;15 reps Hip Flexion/Marching: AROM;15 reps;AAROM;Right;Left;Seated(AAROM on  RLE)    General Comments        Pertinent Vitals/Pain Pain Score: 9  Pain Location: R LE with movement Pain Descriptors / Indicators: Grimacing;Discomfort Pain Intervention(s): Limited activity within patient's tolerance;Monitored during session;Repositioned;Other (comment)(pt refusing pain meds but stating pain is unbearable for mobility)    Home Living                      Prior Function            PT Goals  (current goals can now be found in the care plan section) Progress towards PT goals: Progressing toward goals    Frequency    Min 4X/week      PT Plan Current plan remains appropriate    Co-evaluation              AM-PAC PT "6 Clicks" Mobility   Outcome Measure  Help needed turning from your back to your side while in a flat bed without using bedrails?: A Little Help needed moving from lying on your back to sitting on the side of a flat bed without using bedrails?: A Lot Help needed moving to and from a bed to a chair (including a wheelchair)?: A Lot Help needed standing up from a chair using your arms (e.g., wheelchair or bedside chair)?: A Lot Help needed to walk in hospital room?: A Lot Help needed climbing 3-5 steps with a railing? : Total 6 Click Score: 12    End of Session Equipment Utilized During Treatment: Gait belt Activity Tolerance: Patient limited by pain Patient left: in chair;with call bell/phone within reach;with chair alarm set;with nursing/sitter in room Nurse Communication: Mobility status;Precautions PT Visit Diagnosis: Difficulty in walking, not elsewhere classified (R26.2);Muscle weakness (generalized) (M62.81);History of falling (Z91.81);Unsteadiness on feet (R26.81)     Time: 0920-0950 PT Time Calculation (min) (ACUTE ONLY): 30 min  Charges:  $Therapeutic Exercise: 8-22 mins $Therapeutic Activity: 8-22 mins                     Jodeen Mclin P, PT Acute Rehabilitation Services Pager: [phone] Office: [place]ela[place] Reindel 02/26/2019, 1:47 PM

## 2019-02-26 NOTE — Progress Notes (Signed)
OT Cancellation Note  Patient Details Name: Hannah Mercado MRN: [id number] DOB: 1923-02-11   Cancelled Treatment:     First attempt RN reports patient in a lot of pain and getting stronger pain medications ordered from MD and to try back later. Second attempt upon arrival to patient's room, patient with significant emesis to the point where she vomited up her NG tube. RN notified. Will re-attempt 12/8 as appropriate.   [place] OT office: [place] 02/26/2019, 1:51 PM

## 2019-02-26 NOTE — Progress Notes (Addendum)
Pt c/o nausea and vomiting, PRN Zofran given. Pt stated its okay to place NG tube if needed. Notified on call  X. Blount,NP via text page, no new order.

## 2019-02-26 NOTE — Progress Notes (Signed)
Dr. Juline Patch put the NGT tube for x-ray.

## 2019-02-27 ENCOUNTER — Inpatient Hospital Stay (HOSPITAL_COMMUNITY): Payer: Medicare Other

## 2019-02-27 DIAGNOSIS — J189 Pneumonia, unspecified organism: Secondary | ICD-10-CM

## 2019-02-27 LAB — CBC WITH DIFFERENTIAL/PLATELET
Abs Immature Granulocytes: 0.53 10*3/uL — ABNORMAL HIGH (ref 0.00–0.07)
Basophils Absolute: 0.1 10*3/uL (ref 0.0–0.1)
Basophils Relative: 0 %
Eosinophils Absolute: 0 10*3/uL (ref 0.0–0.5)
Eosinophils Relative: 0 %
HCT: 27.9 % — ABNORMAL LOW (ref 36.0–46.0)
Hemoglobin: 8.3 g/dL — ABNORMAL LOW (ref 12.0–15.0)
Immature Granulocytes: 2 %
Lymphocytes Relative: 6 %
Lymphs Abs: 1.7 10*3/uL (ref 0.7–4.0)
MCH: 33.3 pg (ref 26.0–34.0)
MCHC: 29.7 g/dL — ABNORMAL LOW (ref 30.0–36.0)
MCV: 112 fL — ABNORMAL HIGH (ref 80.0–100.0)
Monocytes Absolute: 0.8 10*3/uL (ref 0.1–1.0)
Monocytes Relative: 3 %
Neutro Abs: 25.1 10*3/uL — ABNORMAL HIGH (ref 1.7–7.7)
Neutrophils Relative %: 89 %
Platelets: 402 10*3/uL — ABNORMAL HIGH (ref 150–400)
RBC: 2.49 MIL/uL — ABNORMAL LOW (ref 3.87–5.11)
RDW: 15.7 % — ABNORMAL HIGH (ref 11.5–15.5)
WBC: 28.2 10*3/uL — ABNORMAL HIGH (ref 4.0–10.5)
nRBC: 0.7 % — ABNORMAL HIGH (ref 0.0–0.2)

## 2019-02-27 LAB — COMPREHENSIVE METABOLIC PANEL
ALT: 21 U/L (ref 0–44)
AST: 28 U/L (ref 15–41)
Albumin: 2.4 g/dL — ABNORMAL LOW (ref 3.5–5.0)
Alkaline Phosphatase: 63 U/L (ref 38–126)
Anion gap: 10 (ref 5–15)
BUN: 41 mg/dL — ABNORMAL HIGH (ref 8–23)
CO2: 28 mmol/L (ref 22–32)
Calcium: 7.6 mg/dL — ABNORMAL LOW (ref 8.9–10.3)
Chloride: 108 mmol/L (ref 98–111)
Creatinine, Ser: 1.31 mg/dL — ABNORMAL HIGH (ref 0.44–1.00)
GFR calc Af Amer: 40 mL/min — ABNORMAL LOW (ref >60)
GFR calc non Af Amer: 35 mL/min — ABNORMAL LOW (ref >60)
Glucose, Bld: 136 mg/dL — ABNORMAL HIGH (ref 70–99)
Potassium: 4.2 mmol/L (ref 3.5–5.1)
Sodium: 146 mmol/L — ABNORMAL HIGH (ref 135–145)
Total Bilirubin: 1.2 mg/dL (ref 0.3–1.2)
Total Protein: 6 g/dL — ABNORMAL LOW (ref 6.5–8.1)

## 2019-02-27 LAB — GLUCOSE, CAPILLARY: Glucose-Capillary: 111 mg/dL — ABNORMAL HIGH (ref 70–99)

## 2019-02-27 LAB — PROCALCITONIN: Procalcitonin: 1.16 ng/mL

## 2019-02-27 MED ORDER — DEXTROSE 5 % IV SOLN
INTRAVENOUS | Status: DC
  Administered 2019-02-27 – 2019-02-28 (×2): via INTRAVENOUS

## 2019-02-27 MED ORDER — METRONIDAZOLE IN NACL 5-0.79 MG/ML-% IV SOLN
500.0000 mg | Freq: Three times a day (TID) | INTRAVENOUS | Status: DC
  Administered 2019-02-27 – 2019-02-28 (×3): 500 mg via INTRAVENOUS
  Filled 2019-02-27 (×2): qty 100

## 2019-02-27 NOTE — Progress Notes (Signed)
Inpatient Rehab Admissions Coordinator:   Met with pt at bedside to discuss progress towards possible return to CIR.  NG tube out last night and, per surgery note, potentially some return of bowel function.  Will continue to follow.   Shann Medal, PT, DPT Admissions Coordinator [phone] 02/27/19  11:34 AM

## 2019-02-27 NOTE — Progress Notes (Signed)
Daily Progress Note   Patient Name: Hannah Mercado       Date: 02/27/2019 DOB: June 02, 1922  Age: 85 y.o. MRN#: [id number] Attending Physician: Patrecia Pour, MD Primary Care Physician: Patient, No Pcp Per Admit Date: 03/13/2019  Reason for Consultation/Follow-up: Establishing goals of care   Subjective: Patient awake, denies pain. States her stomach feels "so nice and soft".  Noted patient has developed multifocal pneumonia.  Called and discussed with [name]- patient's daughter- answered her questions regarding prognosis and treatment. Continue to "hope for best"- which would be recovery from pneumonia and discharge to rehab- but prepared for worst- which would be if patient does not show recovery- advancing diet, improvement in pneumonia.   Review of Systems  Constitutional: Positive for malaise/fatigue.  Respiratory: Positive for cough.     Length of Stay: 7  Current Medications: Scheduled Meds:  . bisacodyl  10 mg Rectal Daily  . Chlorhexidine Gluconate Cloth  6 each Topical Daily  . lip balm  1 application Topical BID  . pantoprazole (PROTONIX) IV  40 mg Intravenous Q12H    Continuous Infusions: . cefTRIAXone (ROCEPHIN)  IV 1 g (02/27/19 1313)  . dextrose 75 mL/hr at 02/27/19 0729  . erythromycin 250 mg (02/27/19 1033)  . metronidazole 500 mg (02/27/19 1311)    PRN Meds: acetaminophen, albuterol, alum & mag hydroxide-simeth, hydrALAZINE, magic mouthwash, menthol-cetylpyridinium, ondansetron **OR** ondansetron (ZOFRAN) IV, phenol  Physical Exam Vitals signs and nursing note reviewed.  Constitutional:      Comments: Frail, cachetic appearing  Skin:    Coloration: Skin is pale.  Neurological:     Mental Status: She is alert and oriented to person, place, and time.              Vital Signs: BP (!) 120/48 (BP Location: Left Arm)   Pulse 66   Temp 98.1 F (36.7 C) (Oral)   Resp 20   Ht 5\' 7"  (1.702 m)   Wt 59.9 kg   SpO2 95%   BMI 20.68 kg/m  SpO2: SpO2: 95 % O2 Device: O2 Device: Nasal Cannula O2 Flow Rate: O2 Flow Rate (L/min): 2 L/min  Intake/output summary:   Intake/Output Summary (Last 24 hours) at 02/27/2019 1639 Last data filed at 02/27/2019 0603 Gross per 24 hour  Intake 200 ml  Output 200 ml  Net 0 ml   LBM: Last BM Date: 02/24/19 Baseline Weight: Weight: 61.3 kg Most recent weight: Weight: 59.9 kg       Palliative Assessment/Data: PPS: 20%      Patient Active Problem List   Diagnosis Date Noted  . Advanced care planning/counseling discussion   . Acute renal failure superimposed on chronic kidney disease ([place])   . Palliative care by specialist   . Goals of care, counseling/discussion   . Small bowel obstruction ([place]) 02/26/2019  . Vomiting 03/01/2019  . ARF (acute renal failure) ([place]) 03/12/2019  . SBO (small bowel obstruction) ([place]) 03/22/2019  . Hip fracture ([place]) 02/19/2019  . Protein-calorie malnutrition, severe 02/16/2019  . Closed hip fracture, right, initial encounter ([place]) 02/15/2019  . Stage 3a chronic kidney disease 02/15/2019  . Back pain 02/21/2018  . Compression fracture of lumbar vertebra ([place]) 02/21/2018  . Osteoporosis 02/21/2018  . Pulmonary hypertension ([place]) 02/20/2018  . Cardiac pacemaker in situ 02/24/2016  . Persistent atrial fibrillation ([place])   . Chronic venous insufficiency 09/11/2015  . Edema 09/11/2015  . Carotid artery disease ([place]) 03/12/2015  . Diverticulosis of colon without hemorrhage 08/02/2012  . UTI (lower urinary tract infection) 02/03/2012  . Hypertension 04/14/2011  . Ovarian cyst, left 12/29/2010  . pT2pN0 (0/12 lymph nodes) cM0 Rectal Cancer 11/27/2010  . Mononeuritis 07/31/2008  . PULMONARY FIBROSIS, POSTINFLAMMATORY 07/31/2008  . Diaphragmatic hernia 07/31/2008  .  Mitral valve disorder 04/20/2007  . HYPERCHOLESTEROLEMIA 04/19/2007  . Anxiety state 04/19/2007  . GERD 04/19/2007  . Osteoarthritis 04/19/2007  . ALLERGY 04/19/2007    Palliative Care Assessment & Plan   Patient Profile: 85 y.o.femalewith past medical history of rectal cancer, a fib, a fib with pacemaker, HTN, HLD, chronic renal insufficiency, pulmonary fibrosis, hip fracture- was recovering in CIR-admitted on12/1/2020with nausea and vomiting- workup reveals small bowel obstruction- likely illeus from recent hip surgery.Has NG placed. Palliative medicine team consulted for [place] and code status discussion.Illeus improving, however, patient now with multifocal pneumonia- likely from aspiration.  Assessment/Recommendations/Plan   Continue current care  PMT will continue to follow and discuss [place] with patient's daughter- [name]  Goals of Care and Additional Recommendations:  Limitations on Scope of Treatment: Full Scope Treatment  Code Status:  DNR  Prognosis:   Unable to determine  Discharge Planning:  To Be Determined  Care plan was discussed with Wandra Feinstein.  Thank you for allowing the Palliative Medicine Team to assist in the care of this patient.   Time In:  1615 Time Out: 1700 Total Time 45 mins Prolonged Time Billed no      Greater than 50%  of this time was spent counseling and coordinating care related to the above assessment and plan.  Mariana Kaufman, AGNP-C Palliative Medicine   Please contact Palliative Medicine Team phone at [phone] for questions and concerns.

## 2019-02-27 NOTE — Progress Notes (Signed)
PROGRESS NOTE  Hannah Mercado  JIR:[id number] DOB: 06/09/1922 DOA: 02/22/2019 PCP: Patient, No Pcp Per   Brief Narrative: Hannah Mercado is a 85 y.o. female with a history of multiple abdominal surgeries, Chronic AFib s/p PPM, CAD, HTN, HLD, pulmonary fibrosis, rectal CA s/p resection, and recent fall s/p right hip IM nail 02/17/2019. She was discharged to [facility] after orthopedic surgery but returned 12/1 after an episode of coffee-ground emesis, abdominal pain found to have significant dilatation of stomach, duodenum, and jejunum with transition point in the right pelvis on CT scan. She was admitted with working diagnosis of SBO vs. ileus and acute renal failure.   Assessment & Plan: Principal Problem:   Small bowel obstruction (HCC) Active Problems:   Hypertension   Persistent atrial fibrillation (HCC)   Pulmonary hypertension (HCC)   Protein-calorie malnutrition, severe   Vomiting   ARF (acute renal failure) (HCC)   SBO (small bowel obstruction) ([place])   Palliative care by specialist   Goals of care, counseling/discussion   Advanced care planning/counseling discussion   Acute renal failure superimposed on chronic kidney disease ([place])  Small bowel obstruction vs. post-operative ileus:  - Management per surgery. NG tube placement has been fraught with difficulty. Start clears today per surgery with improving XR and exam, +flatus.  - Continuing erythromycin started 12/3.  - Encouraged OOB at length as did therapy.  Acute on chronic hypoxic respiratory failure with multifocal pneumonia and acute pulmonary edema with pulmonary HTN on chronic intermittent 2L O2: Covid negative.  - BNP elevated but pt had AKI w/ one dose of lasix, will not continue. Restart low rate IVF given NPO status. - Ceftriaxone and macrolide as already started. Has had negative MRSA PCR. PCT and WBC trending upward. There is possibility of aspiration given frequent bouts of emesis, will add flagyl.  -  Incentive spirometry (significant volume loss on recent CXRs)  Serratia UTI: Sepsis ruled out.  - Continue ceftriaxone per susceptibility data.  - Trend leukocytosis which has been increasing since admission with mild left shift, though remains afebrile.  Acute renal failure on stage IIIb CKD: Reworsened with diuretic challenge 12/7. - Avoid nephrotoxins, hypotension - Monitor Cr, UOP.  - Hold further diuresis.   Anemia of chronic kidney disease complicated by acute blood loss anemia s/p surgery 11/28: Stable/improving. - Continue monitoring with transfusion threshold of 7g/dl.   Chronic atrial fibrillation, MVP: V paced rhythm noted on telemetry with 3 beats NSVT on 12/6.  - Continue telemetry for now. - Rate is controlled off medications.   Hypernatremia:  - Improving, hypotonic IVF continues.  Hyperkalemia: Resolved.  - Monitor  CAD:  - Continue meds once no longer NPO  HTN:  - Continue meds once no longer NPO  History of rectal cancer s/p colectomy.   History of right hip fracture s/p IM nail 02/17/2019:  - Continue PT/OT, possibly DC back to CIR pending clinical course.  DVT prophylaxis: SCDs Code Status: DNR confirmed Family Communication: Daughter and SIL by phone daily Disposition Plan: Uncertain  Consultants:   General surgery  Palliative care medicine team  Procedures:   None  Antimicrobials:  Erythromycin  Ceftriaxone   Flagyl  Subjective: Reports decreased pain and distention in stomach. Has had several traumatic attempts at NG insertion, none reaching stomach and has declined further attempts. No fever, denies cough or shortness of breath. Wears 2L O2 intermittently at home.    Objective: Vitals:   02/26/19 0500 02/26/19 0930 02/26/19 2121 02/27/19 0434  BP:   (!) 107/42 (!) 113/45  Pulse:   63 60  Resp:   20 20  Temp:   99.3 F (37.4 C) 97.8 F (36.6 C)  TempSrc:   Oral   SpO2:  91% 93% 100%  Weight: 59.9 kg     Height:         Intake/Output Summary (Last 24 hours) at 02/27/2019 1242 Last data filed at 02/27/2019 0603 Gross per 24 hour  Intake 200 ml  Output 200 ml  Net 0 ml   Filed Weights   02/24/19 0524 02/25/19 0500 02/26/19 0500  Weight: 58.2 kg 59.7 kg 59.9 kg   Gen: Thin, elderly female in no distress Pulm: Nonlabored breathing 2L O2, 98%. Crackles bibasilar, decreased, no wheezes. CV: Regular rate and rhythm. No murmur, rub, or gallop. No JVD, trace upper thigh dependent edema. GI: Abdomen soft, not appreciably tender, minimally distended without rebound or guarding or rigidity, with normoactive bowel sounds.  Ext: Warm, no deformities Skin: No new rashes, lesions or ulcers on visualized skin. Neuro: Alert and oriented. No focal neurological deficits. Psych: Judgement and insight appear fair. Mood euthymic & affect congruent. Behavior is appropriate.    Data Reviewed: I have personally reviewed following labs and imaging studies  CBC: Recent Labs  Lab 02/23/19 0204 02/24/19 0212 02/25/19 0248 02/26/19 0226 02/27/19 0313  WBC 12.0* 16.8* 22.5* 21.9* 28.2*  NEUTROABS  --   --   --   --  25.1*  HGB 7.9* 8.1* 7.7* 7.7* 8.3*  HCT 25.0* 26.3* 25.3* 26.1* 27.9*  MCV 107.8* 107.3* 109.5* 111.1* 112.0*  PLT 271 301 317 361 159*   Basic Metabolic Panel: Recent Labs  Lab 02/21/19 0301  02/24/19 0212 02/25/19 0248 02/25/19 0919 02/26/19 0226 02/27/19 0313  NA 137   < > 147* 146* 145 148* 146*  K 4.5   < > 3.5 3.8 3.8 3.9 4.2  CL 92*   < > 106 106 109 109 108  CO2 32   < > 31 29 29 28 28   GLUCOSE 147*   < > 162* 126* 132* 123* 136*  BUN 106*   < > 48* 42* 38* 34* 41*  CREATININE 2.65*   < > 0.97 0.97 0.96 0.92 1.31*  CALCIUM 7.4*   < > 7.6* 7.6* 7.6* 7.5* 7.6*  MG 2.5*  --  2.7*  --   --   --   --    < > = values in this interval not displayed.   GFR: Estimated Creatinine Clearance: 24.8 mL/min (A) (by C-G formula based on SCr of 1.31 mg/dL (H)). Liver Function Tests: Recent Labs    Lab 02/22/19 1121 02/25/19 0919 02/27/19 0313  AST 36 41 28  ALT 12 25 21   ALKPHOS 47 60 63  BILITOT 1.8* 1.2 1.2  PROT 5.7* 5.5* 6.0*  ALBUMIN 2.5* 2.3* 2.4*   No results for input(s): LIPASE, AMYLASE in the last 168 hours. No results for input(s): AMMONIA in the last 168 hours. Coagulation Profile: No results for input(s): INR, PROTIME in the last 168 hours. Cardiac Enzymes: Recent Labs  Lab 02/25/19 0919  CKTOTAL 71  CKMB 1.8   BNP (last 3 results) No results for input(s): PROBNP in the last 8760 hours. HbA1C: No results for input(s): HGBA1C in the last 72 hours. CBG: Recent Labs  Lab 02/25/19 2358 02/26/19 0608 02/26/19 1209 02/26/19 2326 02/27/19 0603  GLUCAP 98 105* 133* 128* 111*   Lipid Profile: No results for  input(s): CHOL, HDL, LDLCALC, TRIG, CHOLHDL, LDLDIRECT in the last 72 hours. Thyroid Function Tests: No results for input(s): TSH, T4TOTAL, FREET4, T3FREE, THYROIDAB in the last 72 hours. Anemia Panel: Recent Labs    02/25/19 0248  VITAMINB12 1,895*  FOLATE 30.3  FERRITIN 204  TIBC 207*  IRON 180*  RETICCTPCT 5.6*   Urine analysis:    Component Value Date/Time   COLORURINE YELLOW 08/24/2016 1020   APPEARANCEUR CLEAR 08/24/2016 1020   LABSPEC 1.025 08/24/2016 1020   PHURINE 5.5 08/24/2016 1020   GLUCOSEU NEGATIVE 08/24/2016 1020   HGBUR NEGATIVE 08/24/2016 1020   BILIRUBINUR NEGATIVE 08/24/2016 1020   KETONESUR NEGATIVE 08/24/2016 1020   PROTEINUR NEGATIVE 02/01/2011 1545   UROBILINOGEN 0.2 08/24/2016 1020   NITRITE POSITIVE (A) 08/24/2016 1020   LEUKOCYTESUR MODERATE (A) 08/24/2016 1020   Recent Results (from the past 240 hour(s))  SARS CORONAVIRUS 2 (TAT 6-24 HRS) Nasopharyngeal Nasopharyngeal Swab     Status: None   Collection Time: 02/26/2019  2:32 PM   Specimen: Nasopharyngeal Swab  Result Value Ref Range Status   SARS Coronavirus 2 NEGATIVE NEGATIVE Final    Comment: (NOTE) SARS-CoV-2 target nucleic acids are NOT  DETECTED. The SARS-CoV-2 RNA is generally detectable in upper and lower respiratory specimens during the acute phase of infection. Negative results do not preclude SARS-CoV-2 infection, do not rule out co-infections with other pathogens, and should not be used as the sole basis for treatment or other patient management decisions. Negative results must be combined with clinical observations, patient history, and epidemiological information. The expected result is Negative. Fact Sheet for Patients: SugarRoll.be Fact Sheet for Healthcare Providers: https://www.woods-mathews.com/ This test is not yet approved or cleared by the [place] FDA and  has been authorized for detection and/or diagnosis of SARS-CoV-2 by FDA under an Emergency Use Authorization (EUA). This EUA will remain  in effect (meaning this test can be used) for the duration of the COVID-19 declaration under Section 56 4(b)(1) of the Act, 21 U.S.C. section 360bbb-3(b)(1), unless the authorization is terminated or revoked sooner. Performed at [facility], [place] [address] [place], [place] [address]   Urine culture     Status: Abnormal   Collection Time: 02/24/19  9:12 AM   Specimen: Urine, Catheterized  Result Value Ref Range Status   Specimen Description URINE, CATHETERIZED  Final   Special Requests   Final    Normal Performed at [facility], [place] [address], [place], [place] [address]    Culture (A)  Final    >=100,000 COLONIES/mL SERRATIA MARCESCENS 60,000 COLONIES/mL YEAST    Report Status 02/26/2019 FINAL  Final   Organism ID, Bacteria SERRATIA MARCESCENS (A)  Final      Susceptibility   Serratia marcescens - MIC*    CEFAZOLIN >=64 RESISTANT Resistant     CEFTRIAXONE <=1 SENSITIVE Sensitive     CIPROFLOXACIN <=0.25 SENSITIVE Sensitive     GENTAMICIN <=1 SENSITIVE Sensitive     NITROFURANTOIN 128 RESISTANT Resistant     TRIMETH/SULFA <=20 SENSITIVE  Sensitive     * >=100,000 COLONIES/mL SERRATIA MARCESCENS  SARS CORONAVIRUS 2 (TAT 6-24 HRS) Nasopharyngeal Nasopharyngeal Swab     Status: None   Collection Time: 02/26/19  3:57 PM   Specimen: Nasopharyngeal Swab  Result Value Ref Range Status   SARS Coronavirus 2 NEGATIVE NEGATIVE Final    Comment: (NOTE) SARS-CoV-2 target nucleic acids are NOT DETECTED. The SARS-CoV-2 RNA is generally detectable in upper and lower respiratory specimens  during the acute phase of infection. Negative results do not preclude SARS-CoV-2 infection, do not rule out co-infections with other pathogens, and should not be used as the sole basis for treatment or other patient management decisions. Negative results must be combined with clinical observations, patient history, and epidemiological information. The expected result is Negative. Fact Sheet for Patients: SugarRoll.be Fact Sheet for Healthcare Providers: https://www.woods-mathews.com/ This test is not yet approved or cleared by the [place] FDA and  has been authorized for detection and/or diagnosis of SARS-CoV-2 by FDA under an Emergency Use Authorization (EUA). This EUA will remain  in effect (meaning this test can be used) for the duration of the COVID-19 declaration under Section 56 4(b)(1) of the Act, 21 U.S.C. section 360bbb-3(b)(1), unless the authorization is terminated or revoked sooner. Performed at [facility], [place] [address], [place], [place] [address]       Radiology Studies: Dg Chest 1 View  Result Date: 02/27/2019 CLINICAL DATA:  Congestive heart failure. Weakness. EXAM: CHEST  1 VIEW COMPARISON:  02/26/2019 FINDINGS: Pacemaker remains in place. Orogastric or nasogastric 2 has been placed and shows multiple loops in the cervical and thoracic region, the tip only extending to the distal thoracic region. Widespread bilateral patchy pulmonary infiltrates persist with lower lobe  consolidation and volume loss. IMPRESSION: 1. Persistent widespread bilateral patchy pulmonary infiltrates and bilateral lower lobe volume loss. 2. Unsatisfactory nasogastric position with multiple loops in the cervical and thoracic region, the tip only extending to the distal thoracic region. Electronically Signed   By: Nelson Chimes M.D.   On: 02/27/2019 08:29   Dg Abd 1 View  Result Date: 02/27/2019 CLINICAL DATA:  Nasogastric placement. EXAM: ABDOMEN - 1 VIEW COMPARISON:  02/26/2019 FINDINGS: Nasogastric tube does not reach the abdomen. Tube tip is only at the distal thoracic esophagus region, not extending below the diaphragm. Abdominal gas pattern is unremarkable. IMPRESSION: Nasogastric tube tip does not enters the abdomen. See results of chest radiography. Electronically Signed   By: Nelson Chimes M.D.   On: 02/27/2019 08:30   Dg Abd 1 View  Result Date: 02/26/2019 CLINICAL DATA:  Enteric tube placement EXAM: ABDOMEN - 1 VIEW COMPARISON:  February 26, 2019 FINDINGS: The enteric tube projects over the left lower lung zone. This may be be located within the patient's previously demonstrated large hiatal hernia. Vertebral plana is noted of several lumbar vertebral bodies, better visualized on prior CT. IMPRESSION: The enteric tube projects over the left lower lung zone. This may be located within the patient's previously demonstrated large hiatal hernia. Consider further advancement if possible. Electronically Signed   By: Constance Holster M.D.   On: 02/26/2019 20:10   Dg Abd 1 View  Result Date: 02/26/2019 CLINICAL DATA:  Nasogastric tube placement EXAM: ABDOMEN - 1 VIEW COMPARISON:  Portable exam 1305 hours compared to 1051 hours FINDINGS: Tip of nasogastric tube is above the carina and projects over the trachea, uncertain if within esophagus or airway. Enlargement of cardiac silhouette post pacemaker. Nonobstructive bowel gas pattern. Bones demineralized with compression deformities of T11, L2,  L3, L4. IMPRESSION: Tip of nasogastric tube projects over the trachea above the carina, uncertain if within esophagus or trachea; recommend removal and replacement. Normal bowel gas pattern. Findings called to [facility] on 6N on 02/26/2019 at 1340 hours. Electronically Signed   By: Lavonia Dana M.D.   On: 02/26/2019 13:42   Dg Chest Port 1 View  Result Date: 02/26/2019 CLINICAL DATA:  85 year old  female with history of NG tube placement. EXAM: PORTABLE CHEST 1 VIEW COMPARISON:  Chest x-ray 02/15/2019. FINDINGS: Tip of nasogastric tube in the mid esophagus shortly beyond the level of the thoracic inlet. Redundant tubing appears coiled in the upper esophagus. Bibasilar opacities which may reflect areas of atelectasis and/or consolidation. Patchy multifocal areas of interstitial prominence an ill-defined airspace disease in the lungs bilaterally (right greater than left), concerning for multilobar pneumonia. Small to moderate bilateral pleural effusions increased compared to the prior study. Mild cardiomegaly. Cephalization of the pulmonary vasculature. Aortic atherosclerosis. Left-sided pacemaker device in place with lead tips projecting over the expected location of the right ventricular apex. IMPRESSION: 1. Nasogastric tube tip is in the mid esophagus with redundant portion of the tube coiled in the proximal esophagus. 2. Marked worsening aeration throughout the lungs bilaterally, with asymmetrically distributed areas of interstitial prominence and patchy multifocal ill-defined airspace disease concerning for developing multilobar bilateral pneumonia, with small to moderate bilateral pleural effusions which have increased compared to the prior examination. 3. Cardiomegaly with cephalization of the pulmonary vasculature, suggesting some degree of developing congestive heart failure. 4. Aortic atherosclerosis. Electronically Signed   By: Vinnie Langton M.D.   On: 02/26/2019 13:43   Dg Abd Portable 1v  Result  Date: 02/26/2019 CLINICAL DATA:  Abdominal pain and distension. Small bowel obstruction. EXAM: PORTABLE ABDOMEN - 1 VIEW COMPARISON:  Abdominal pain 02/25/2019. FINDINGS: There is continued gaseous distention of small bowel up to 5.6 cm. Gas and stool are again seen in the colon. IMPRESSION: No change in gaseous distention of small bowel compatible with obstruction. Electronically Signed   By: Inge Rise M.D.   On: 02/26/2019 11:15    Scheduled Meds:  bisacodyl  10 mg Rectal Daily   Chlorhexidine Gluconate Cloth  6 each Topical Daily   lip balm  1 application Topical BID   pantoprazole (PROTONIX) IV  40 mg Intravenous Q12H   Continuous Infusions:  cefTRIAXone (ROCEPHIN)  IV 1 g (02/26/19 1453)   dextrose 75 mL/hr at 02/27/19 0729   erythromycin 250 mg (02/27/19 1033)     LOS: 7 days   Time spent: 35 minutes.  Patrecia Pour, MD [facility] [email or web address] 02/27/2019, 12:42 PM

## 2019-02-27 NOTE — Progress Notes (Signed)
Occupational Therapy Treatment Patient Details Name: Hannah Mercado MRN: [id number] DOB: [birth year] Today's Date: 02/27/2019    History of present illness 85yo female s/p R hip IM nail placement 02/17/19, was transferred to CIR then had 676mL coffee ground emesis with return to acute with SBO and ARF. PMH A-fib, s/p PPM, CAD, HLD, HTN, mitral valve disorder, pulmonary fibrosis, rectal CA, colon resection, peripheral neuropathy   OT comments  Patient semi-supine upon arrival, reports feeling tired this AM but wants to try and sit on commode. Min/mod A supine to sit to progress R LE to edge of bed, verbal cues for sequencing trunk mobility. Mod A for transfer bed to commode with cues for body mechanics and safety as patient is letting go of walker before aligned with commode. Pt unsuccessful with bowel movement, requires max A to stand from commode with decreased activity tolerance, strength, balance and eccentric control with sitting. Min/mod A to lift LEs onto the bed, repositioned for comfort.   Upon arrival pt on room air, on commode O2 82%. Donned 2L O2 per RN instruction with O2 sats reading 100% once returned to bed.    Follow Up Recommendations  CIR;Supervision/Assistance - 24 hour    Equipment Recommendations  Other (comment)(TBD)       Precautions / Restrictions Precautions Precautions: Fall Precaution Comments: watch O2 sats, knees buckle Restrictions Weight Bearing Restrictions: Yes RLE Weight Bearing: Weight bearing as tolerated       Mobility Bed Mobility Overal bed mobility: Needs Assistance Bed Mobility: Supine to Sit;Sit to Supine     Supine to sit: Min assist;Mod assist Sit to supine: Min assist;Mod assist   General bed mobility comments: assist with mobilizing R LE toward EOB, able to push through elbows for trunk mobility. Min/mod A to lift LEs onto edge of bed.   Transfers Overall transfer level: Needs assistance Equipment used: Rolling walker (2  wheeled) Transfers: Sit to/from [facility] Sit to Stand: Mod assist;Max assist Stand pivot transfers: Max assist;Mod assist       General transfer comment: decreased safety awareness with body mechanics, patient has difficulty putting weight through UEs and maintaining upright posture. patient letting go of walker prematurely before aligned with surface.     Balance Overall balance assessment: Needs assistance Sitting-balance support: Feet supported;Bilateral upper extremity supported Sitting balance-Leahy Scale: Fair Sitting balance - Comments: patient reports feeling dizzy upon sitting up, require increase time for recovery   Standing balance support: Bilateral upper extremity supported;During functional activity Standing balance-Leahy Scale: Poor Standing balance comment: reliant on RW and additional external support                           ADL either performed or assessed with clinical judgement   ADL Overall ADL's : Needs assistance/impaired                         Toilet Transfer: Moderate assistance;Maximal assistance;Stand-pivot;BSC;RW Toilet Transfer Details (indicate cue type and reason): mod A from EOB to commode, max A from commode to EOB. decreased balance, safety awareness, strength. high fall risk         Functional mobility during ADLs: Moderate assistance;Maximal assistance;Rolling walker;Cueing for safety;Cueing for sequencing       Vision   Additional Comments: patient reports she can't see without her glasses, they have been lost          Cognition Arousal/Alertness: Awake/alert Behavior During Therapy:  WFL for tasks assessed/performed Overall Cognitive Status: Impaired/Different from baseline Area of Impairment: Following commands;Safety/judgement                       Following Commands: Follows one step commands inconsistently Safety/Judgement: Decreased awareness of safety     General  Comments: letting go of walker during transfer to grab commode, bed rail, cues to redirect              General Comments pt on room air upon arrival, 82% post transfer. with 2L O2 donned patient 100% once back to bed.     Pertinent Vitals/ Pain       Pain Assessment: Faces Faces Pain Scale: Hurts little more Pain Location: R LE with movement Pain Descriptors / Indicators: Grimacing;Discomfort Pain Intervention(s): Limited activity within patient's tolerance;Monitored during session;Repositioned         Frequency  Min 2X/week        Progress Toward Goals  OT Goals(current goals can now be found in the care plan section)  Progress towards OT goals: Progressing toward goals  Acute Rehab OT Goals Patient Stated Goal: to go to the bathroom OT Goal Formulation: With patient Time For Goal Achievement: 03/08/19 Potential to Achieve Goals: Good ADL Goals Pt Will Perform Grooming: with min assist;standing Pt Will Perform Lower Body Bathing: with min assist;with adaptive equipment;sit to/from stand Pt Will Perform Lower Body Dressing: with min assist;with adaptive equipment;sit to/from stand Pt Will Transfer to Toilet: with min assist;ambulating;bedside commode Pt Will Perform Toileting - Clothing Manipulation and hygiene: with min assist;sit to/from stand  Plan Discharge plan remains appropriate       AM-PAC OT "6 Clicks" Daily Activity     Outcome Measure   Help from another person eating meals?: Total(NPO) Help from another person taking care of personal grooming?: A Little Help from another person toileting, which includes using toliet, bedpan, or urinal?: A Lot Help from another person bathing (including washing, rinsing, drying)?: A Lot Help from another person to put on and taking off regular upper body clothing?: A Little Help from another person to put on and taking off regular lower body clothing?: A Lot 6 Click Score: 13    End of Session Equipment Utilized  During Treatment: Gait belt;Rolling walker;Oxygen  OT Visit Diagnosis: Unsteadiness on feet (R26.81);Other abnormalities of gait and mobility (R26.89);Muscle weakness (generalized) (M62.81);Pain Pain - Right/Left: Right Pain - part of body: Leg   Activity Tolerance Patient tolerated treatment well   Patient Left in bed;with call bell/phone within reach;with bed alarm set   Nurse Communication Mobility status        Time: 0812-0850 OT Time Calculation (min): 38 min  Charges: OT General Charges $OT Visit: 1 Visit OT Treatments $Self Care/Home Management : 38-52 mins  [place] OT office: [place] 02/27/2019, 9:07 AM

## 2019-02-27 NOTE — Progress Notes (Signed)
PT Cancellation Note  Patient Details Name: Hannah Mercado MRN: [id number] DOB: 1923-01-17   Cancelled Treatment:    Reason Eval/Treat Not Completed: Fatigue/lethargy limiting ability to participate(pt talking with MD on arrival and stated fatigue from lack of nutrition and conversation. Despite MD and PT education for encouragement and benefit of mobility pt ultimately declined all mobility and HEP at this time)   Sandy Salaam Dyshon Philbin 02/27/2019, 10:27 AM  Bayard Males, PT Acute Rehabilitation Services Pager: [phone] Office: [phone]

## 2019-02-27 NOTE — Progress Notes (Signed)
[facility] Progress Note     Subjective: CC-  Denies any abdominal pain or nausea. NG tube is not in the stomach. States that she feels less bloated and she is a little hungry. Passing some flatus. No BM yesterday or today.   Objective: Vital signs in last 24 hours: Temp:  [97.8 F (36.6 C)-99.3 F (37.4 C)] 97.8 F (36.6 C) (12/08 0434) Pulse Rate:  [60-63] 60 (12/08 0434) Resp:  [20] 20 (12/08 0434) BP: (107-113)/(42-45) 113/45 (12/08 0434) SpO2:  [93 %-100 %] 100 % (12/08 0434) Last BM Date: 02/25/19  Intake/Output from previous day: 12/07 0701 - 12/08 0700 In: 200 [IV Piggyback:200] Out: 200 [Urine:200] Intake/Output this shift: No intake/output data recorded.  PE: Gen:  Alert, NAD, pleasant HEENT: EOM's intact, pupils equal and round Card:  RRR Pulm:  rate and effort normal, decreased breath sounds bilateral bases, no wheezing or rhonchi Abd: Soft, mild distension, nontender, +BS, no HSM Skin: warm and dry   Lab Results:  Recent Labs    02/26/19 0226 02/27/19 0313  WBC 21.9* 28.2*  HGB 7.7* 8.3*  HCT 26.1* 27.9*  PLT 361 402*   BMET Recent Labs    02/26/19 0226 02/27/19 0313  NA 148* 146*  K 3.9 4.2  CL 109 108  CO2 28 28  GLUCOSE 123* 136*  BUN 34* 41*  CREATININE 0.92 1.31*  CALCIUM 7.5* 7.6*   PT/INR No results for input(s): LABPROT, INR in the last 72 hours. CMP     Component Value Date/Time   NA 146 (H) 02/27/2019 0313   K 4.2 02/27/2019 0313   CL 108 02/27/2019 0313   CO2 28 02/27/2019 0313   GLUCOSE 136 (H) 02/27/2019 0313   BUN 41 (H) 02/27/2019 0313   CREATININE 1.31 (H) 02/27/2019 0313   CREATININE 1.08 (H) 10/09/2015 1130   CALCIUM 7.6 (L) 02/27/2019 0313   PROT 6.0 (L) 02/27/2019 0313   ALBUMIN 2.4 (L) 02/27/2019 0313   AST 28 02/27/2019 0313   ALT 21 02/27/2019 0313   ALKPHOS 63 02/27/2019 0313   BILITOT 1.2 02/27/2019 0313   GFRNONAA 35 (L) 02/27/2019 0313   GFRAA 40 (L) 02/27/2019 0313   Lipase  No  results found for: LIPASE     Studies/Results: Dg Chest 1 View  Result Date: 02/27/2019 CLINICAL DATA:  Congestive heart failure. Weakness. EXAM: CHEST  1 VIEW COMPARISON:  02/26/2019 FINDINGS: Pacemaker remains in place. Orogastric or nasogastric 2 has been placed and shows multiple loops in the cervical and thoracic region, the tip only extending to the distal thoracic region. Widespread bilateral patchy pulmonary infiltrates persist with lower lobe consolidation and volume loss. IMPRESSION: 1. Persistent widespread bilateral patchy pulmonary infiltrates and bilateral lower lobe volume loss. 2. Unsatisfactory nasogastric position with multiple loops in the cervical and thoracic region, the tip only extending to the distal thoracic region. Electronically Signed   By: Nelson Chimes M.D.   On: 02/27/2019 08:29   Dg Abd 1 View  Result Date: 02/27/2019 CLINICAL DATA:  Nasogastric placement. EXAM: ABDOMEN - 1 VIEW COMPARISON:  02/26/2019 FINDINGS: Nasogastric tube does not reach the abdomen. Tube tip is only at the distal thoracic esophagus region, not extending below the diaphragm. Abdominal gas pattern is unremarkable. IMPRESSION: Nasogastric tube tip does not enters the abdomen. See results of chest radiography. Electronically Signed   By: Nelson Chimes M.D.   On: 02/27/2019 08:30   Dg Abd 1 View  Result Date: 02/26/2019 CLINICAL DATA:  Enteric tube placement EXAM: ABDOMEN - 1 VIEW COMPARISON:  February 26, 2019 FINDINGS: The enteric tube projects over the left lower lung zone. This may be be located within the patient's previously demonstrated large hiatal hernia. Vertebral plana is noted of several lumbar vertebral bodies, better visualized on prior CT. IMPRESSION: The enteric tube projects over the left lower lung zone. This may be located within the patient's previously demonstrated large hiatal hernia. Consider further advancement if possible. Electronically Signed   By: Constance Holster M.D.    On: 02/26/2019 20:10   Dg Abd 1 View  Result Date: 02/26/2019 CLINICAL DATA:  Nasogastric tube placement EXAM: ABDOMEN - 1 VIEW COMPARISON:  Portable exam 1305 hours compared to 1051 hours FINDINGS: Tip of nasogastric tube is above the carina and projects over the trachea, uncertain if within esophagus or airway. Enlargement of cardiac silhouette post pacemaker. Nonobstructive bowel gas pattern. Bones demineralized with compression deformities of T11, L2, L3, L4. IMPRESSION: Tip of nasogastric tube projects over the trachea above the carina, uncertain if within esophagus or trachea; recommend removal and replacement. Normal bowel gas pattern. Findings called to [facility] on 6N on 02/26/2019 at 1340 hours. Electronically Signed   By: Lavonia Dana M.D.   On: 02/26/2019 13:42   Dg Chest Port 1 View  Result Date: 02/26/2019 CLINICAL DATA:  85 year old female with history of NG tube placement. EXAM: PORTABLE CHEST 1 VIEW COMPARISON:  Chest x-ray 02/15/2019. FINDINGS: Tip of nasogastric tube in the mid esophagus shortly beyond the level of the thoracic inlet. Redundant tubing appears coiled in the upper esophagus. Bibasilar opacities which may reflect areas of atelectasis and/or consolidation. Patchy multifocal areas of interstitial prominence an ill-defined airspace disease in the lungs bilaterally (right greater than left), concerning for multilobar pneumonia. Small to moderate bilateral pleural effusions increased compared to the prior study. Mild cardiomegaly. Cephalization of the pulmonary vasculature. Aortic atherosclerosis. Left-sided pacemaker device in place with lead tips projecting over the expected location of the right ventricular apex. IMPRESSION: 1. Nasogastric tube tip is in the mid esophagus with redundant portion of the tube coiled in the proximal esophagus. 2. Marked worsening aeration throughout the lungs bilaterally, with asymmetrically distributed areas of interstitial prominence and patchy  multifocal ill-defined airspace disease concerning for developing multilobar bilateral pneumonia, with small to moderate bilateral pleural effusions which have increased compared to the prior examination. 3. Cardiomegaly with cephalization of the pulmonary vasculature, suggesting some degree of developing congestive heart failure. 4. Aortic atherosclerosis. Electronically Signed   By: Vinnie Langton M.D.   On: 02/26/2019 13:43   Dg Abd Portable 1v  Result Date: 02/26/2019 CLINICAL DATA:  Abdominal pain and distension. Small bowel obstruction. EXAM: PORTABLE ABDOMEN - 1 VIEW COMPARISON:  Abdominal pain 02/25/2019. FINDINGS: There is continued gaseous distention of small bowel up to 5.6 cm. Gas and stool are again seen in the colon. IMPRESSION: No change in gaseous distention of small bowel compatible with obstruction. Electronically Signed   By: Inge Rise M.D.   On: 02/26/2019 11:15    Anti-infectives: Anti-infectives (From admission, onward)   Start     Dose/Rate Route Frequency Ordered Stop   02/25/19 1445  cefTRIAXone (ROCEPHIN) 1 g in sodium chloride 0.9 % 100 mL IVPB     1 g 200 mL/hr over 30 Minutes Intravenous Every 24 hours 02/25/19 1441     02/22/19 1230  erythromycin 250 mg in sodium chloride 0.9 % 100 mL IVPB     250 mg 100  mL/hr over 60 Minutes Intravenous Every 6 hours 02/22/19 1130         Assessment/Plan HTN CAD Pacemaker in place Pulmonary fibrosis/pulmonary HTN CKD A fib on eliquis (last dose 02/18/19) - eliquis on hold Code status DNR Right intertrochanteric femur fracture s/p IMN 02/17/19 by Dr. Lyla Glassing Hiatal hernia AKI - Cr up 1.31, low UOP UTI - Ucx grew Serratia marcescens, on rocephin 12/6>> Multifocal PNA - on rocephin  SBO vs Ileus - initially felt this more likely ileus related to recent orthopedic surgery - multiple prior abdominal surgeries: Laparoscopic Left oophorectomyandlow anterior resection11/19/2012 by Dr. Barry Dienes for rectal cancer;  appendectomy; surgery for ectopic pregnancy; vaginal hysterectomy  -Started on erythromycin 250 mg IV every 6 for motility 12/3>> - passing some gas, bowel gas pattern improved on xray  ID -rocephin 12/6>>. WBC worsening 28.2, afebrile VTE -SCDs, eliquison hold/ok for heparin gtt FEN -IVF, sips/chips Foley -placed 12/1 for retention Follow up -TBD  Plan: Patient is having some bowel function, she is less distended, and bowel gas pattern improved on xray. NG is not in her stomach. Will remove NG tube and allow sips of clears and ice chips from the floor. Give dulcolax suppository. Continue mobilizing.    LOS: 7 days    [facility] 02/27/2019, 9:42 AM Please see Amion for pager number during day hours 7:00am-4:30pm

## 2019-02-28 ENCOUNTER — Inpatient Hospital Stay (HOSPITAL_COMMUNITY): Payer: Medicare Other

## 2019-02-28 DIAGNOSIS — K567 Ileus, unspecified: Secondary | ICD-10-CM

## 2019-02-28 DIAGNOSIS — K9189 Other postprocedural complications and disorders of digestive system: Secondary | ICD-10-CM

## 2019-02-28 LAB — CBC WITH DIFFERENTIAL/PLATELET
Abs Immature Granulocytes: 0.75 10*3/uL — ABNORMAL HIGH (ref 0.00–0.07)
Basophils Absolute: 0.1 10*3/uL (ref 0.0–0.1)
Basophils Relative: 0 %
Eosinophils Absolute: 0 10*3/uL (ref 0.0–0.5)
Eosinophils Relative: 0 %
HCT: 25.9 % — ABNORMAL LOW (ref 36.0–46.0)
Hemoglobin: 7.9 g/dL — ABNORMAL LOW (ref 12.0–15.0)
Immature Granulocytes: 2 %
Lymphocytes Relative: 3 %
Lymphs Abs: 1.1 10*3/uL (ref 0.7–4.0)
MCH: 34.1 pg — ABNORMAL HIGH (ref 26.0–34.0)
MCHC: 30.5 g/dL (ref 30.0–36.0)
MCV: 111.6 fL — ABNORMAL HIGH (ref 80.0–100.0)
Monocytes Absolute: 0.8 10*3/uL (ref 0.1–1.0)
Monocytes Relative: 2 %
Neutro Abs: 40.7 10*3/uL — ABNORMAL HIGH (ref 1.7–7.7)
Neutrophils Relative %: 93 %
Platelets: 371 10*3/uL (ref 150–400)
RBC: 2.32 MIL/uL — ABNORMAL LOW (ref 3.87–5.11)
RDW: 15.9 % — ABNORMAL HIGH (ref 11.5–15.5)
WBC: 43.5 10*3/uL — ABNORMAL HIGH (ref 4.0–10.5)
nRBC: 0.4 % — ABNORMAL HIGH (ref 0.0–0.2)

## 2019-02-28 LAB — COMPREHENSIVE METABOLIC PANEL
ALT: 17 U/L (ref 0–44)
AST: 21 U/L (ref 15–41)
Albumin: 2 g/dL — ABNORMAL LOW (ref 3.5–5.0)
Alkaline Phosphatase: 69 U/L (ref 38–126)
Anion gap: 14 (ref 5–15)
BUN: 40 mg/dL — ABNORMAL HIGH (ref 8–23)
CO2: 24 mmol/L (ref 22–32)
Calcium: 7.4 mg/dL — ABNORMAL LOW (ref 8.9–10.3)
Chloride: 107 mmol/L (ref 98–111)
Creatinine, Ser: 1.3 mg/dL — ABNORMAL HIGH (ref 0.44–1.00)
GFR calc Af Amer: 41 mL/min — ABNORMAL LOW (ref >60)
GFR calc non Af Amer: 35 mL/min — ABNORMAL LOW (ref >60)
Glucose, Bld: 207 mg/dL — ABNORMAL HIGH (ref 70–99)
Potassium: 3.7 mmol/L (ref 3.5–5.1)
Sodium: 145 mmol/L (ref 135–145)
Total Bilirubin: 1.1 mg/dL (ref 0.3–1.2)
Total Protein: 5.3 g/dL — ABNORMAL LOW (ref 6.5–8.1)

## 2019-02-28 LAB — GLUCOSE, CAPILLARY
Glucose-Capillary: 139 mg/dL — ABNORMAL HIGH (ref 70–99)
Glucose-Capillary: 145 mg/dL — ABNORMAL HIGH (ref 70–99)
Glucose-Capillary: 168 mg/dL — ABNORMAL HIGH (ref 70–99)
Glucose-Capillary: 190 mg/dL — ABNORMAL HIGH (ref 70–99)

## 2019-02-28 MED ORDER — PIPERACILLIN-TAZOBACTAM 3.375 G IVPB
3.3750 g | Freq: Three times a day (TID) | INTRAVENOUS | Status: DC
  Administered 2019-02-28 – 2019-03-02 (×6): 3.375 g via INTRAVENOUS
  Filled 2019-02-28 (×6): qty 50

## 2019-02-28 MED ORDER — ACETAMINOPHEN 325 MG PO TABS
650.0000 mg | ORAL_TABLET | Freq: Four times a day (QID) | ORAL | Status: DC | PRN
  Administered 2019-02-28 (×2): 650 mg via ORAL
  Filled 2019-02-28 (×2): qty 2

## 2019-02-28 MED ORDER — ADULT MULTIVITAMIN W/MINERALS CH
1.0000 | ORAL_TABLET | Freq: Every day | ORAL | Status: DC
  Administered 2019-02-28 – 2019-03-01 (×2): 1 via ORAL
  Filled 2019-02-28 (×3): qty 1

## 2019-02-28 MED ORDER — BOOST / RESOURCE BREEZE PO LIQD CUSTOM
1.0000 | Freq: Three times a day (TID) | ORAL | Status: DC
  Administered 2019-02-28 – 2019-03-01 (×3): 1 via ORAL

## 2019-02-28 MED ORDER — FUROSEMIDE 10 MG/ML IJ SOLN
40.0000 mg | Freq: Once | INTRAMUSCULAR | Status: AC
  Administered 2019-02-28: 40 mg via INTRAVENOUS
  Filled 2019-02-28: qty 4

## 2019-02-28 NOTE — Progress Notes (Signed)
Pharmacy Antibiotic Note  Hannah Mercado is a 85 y.o. female admitted on 02/27/2019 with sepsis.  Pharmacy has been consulted for Zosyn dosing.  Plan: Zosyn 3.375 grams iv Q 8 hours - 4 hr infusion Pharmacy to sign off and follow peripherally for changes in renal function  Height: 5\' 7"  (170.2 cm) Weight: 132 lb 0.9 oz (59.9 kg) IBW/kg (Calculated) : 61.6  Temp (24hrs), Avg:98.4 F (36.9 C), Min:98.1 F (36.7 C), Max:98.7 F (37.1 C)  Recent Labs  Lab 02/24/19 0212 02/25/19 0248 02/25/19 0919 02/25/19 1813 02/26/19 0226 02/27/19 0313 02/28/19 0251  WBC 16.8* 22.5*  --   --  21.9* 28.2* 43.5*  CREATININE 0.97 0.97 0.96  --  0.92 1.31* 1.30*  LATICACIDVEN  --   --  1.4 1.5  --   --   --     Estimated Creatinine Clearance: 25 mL/min (A) (by C-G formula based on SCr of 1.3 mg/dL (H)).    Allergies  Allergen Reactions  . Sulfonamide Derivatives Nausea And Vomiting    Happened a long time ago, does not remember the other reaction she had.     Thank you Tad Moore 02/28/2019 11:32 AM

## 2019-02-28 NOTE — Progress Notes (Signed)
[facility] Progress Note     Subjective: CC-  Feeling well this morning. Tolerating liquids. Drank an entire [place] this morning and wants more. Denies any n/v but she does feel a little bloated. Continues to pass flatus. Last BM 3 days ago.  Objective: Vital signs in last 24 hours: Temp:  [98.1 F (36.7 C)-98.7 F (37.1 C)] 98.2 F (36.8 C) (12/09 1004) Pulse Rate:  [63-70] 63 (12/09 1004) Resp:  [21-34] 34 (12/09 1004) BP: (118-125)/(48-55) 118/49 (12/09 1004) SpO2:  [91 %-96 %] 95 % (12/09 1004) Last BM Date: 02/24/19  Intake/Output from previous day: 12/08 0701 - 12/09 0700 In: 1904.6 [P.O.:120; I.V.:713.5; IV Piggyback:1071.1] Out: 800 [Urine:800] Intake/Output this shift: No intake/output data recorded.  PE: Gen: Alert, NAD, pleasant HEENT: EOM's intact, pupils equal and round Card: RRR Pulm: tachypneic, decreased breath sounds bilateral bases, no wheezing or rhonchi Abd: Soft,mild distension, nontender, +BS, no HSM Skin: warm and dry    Lab Results:  Recent Labs    02/27/19 0313 02/28/19 0251  WBC 28.2* 43.5*  HGB 8.3* 7.9*  HCT 27.9* 25.9*  PLT 402* 371   BMET Recent Labs    02/27/19 0313 02/28/19 0251  NA 146* 145  K 4.2 3.7  CL 108 107  CO2 28 24  GLUCOSE 136* 207*  BUN 41* 40*  CREATININE 1.31* 1.30*  CALCIUM 7.6* 7.4*   PT/INR No results for input(s): LABPROT, INR in the last 72 hours. CMP     Component Value Date/Time   NA 145 02/28/2019 0251   K 3.7 02/28/2019 0251   CL 107 02/28/2019 0251   CO2 24 02/28/2019 0251   GLUCOSE 207 (H) 02/28/2019 0251   BUN 40 (H) 02/28/2019 0251   CREATININE 1.30 (H) 02/28/2019 0251   CREATININE 1.08 (H) 10/09/2015 1130   CALCIUM 7.4 (L) 02/28/2019 0251   PROT 5.3 (L) 02/28/2019 0251   ALBUMIN 2.0 (L) 02/28/2019 0251   AST 21 02/28/2019 0251   ALT 17 02/28/2019 0251   ALKPHOS 69 02/28/2019 0251   BILITOT 1.1 02/28/2019 0251   GFRNONAA 35 (L) 02/28/2019 0251   GFRAA 41 (L)  02/28/2019 0251   Lipase  No results found for: LIPASE     Studies/Results: Dg Chest 1 View  Result Date: 02/27/2019 CLINICAL DATA:  Congestive heart failure. Weakness. EXAM: CHEST  1 VIEW COMPARISON:  02/26/2019 FINDINGS: Pacemaker remains in place. Orogastric or nasogastric 2 has been placed and shows multiple loops in the cervical and thoracic region, the tip only extending to the distal thoracic region. Widespread bilateral patchy pulmonary infiltrates persist with lower lobe consolidation and volume loss. IMPRESSION: 1. Persistent widespread bilateral patchy pulmonary infiltrates and bilateral lower lobe volume loss. 2. Unsatisfactory nasogastric position with multiple loops in the cervical and thoracic region, the tip only extending to the distal thoracic region. Electronically Signed   By: Nelson Chimes M.D.   On: 02/27/2019 08:29   Dg Abd 1 View  Result Date: 02/27/2019 CLINICAL DATA:  Nasogastric placement. EXAM: ABDOMEN - 1 VIEW COMPARISON:  02/26/2019 FINDINGS: Nasogastric tube does not reach the abdomen. Tube tip is only at the distal thoracic esophagus region, not extending below the diaphragm. Abdominal gas pattern is unremarkable. IMPRESSION: Nasogastric tube tip does not enters the abdomen. See results of chest radiography. Electronically Signed   By: Nelson Chimes M.D.   On: 02/27/2019 08:30   Dg Abd 1 View  Result Date: 02/26/2019 CLINICAL DATA:  Enteric tube placement EXAM: ABDOMEN -  1 VIEW COMPARISON:  February 26, 2019 FINDINGS: The enteric tube projects over the left lower lung zone. This may be be located within the patient's previously demonstrated large hiatal hernia. Vertebral plana is noted of several lumbar vertebral bodies, better visualized on prior CT. IMPRESSION: The enteric tube projects over the left lower lung zone. This may be located within the patient's previously demonstrated large hiatal hernia. Consider further advancement if possible. Electronically Signed    By: Constance Holster M.D.   On: 02/26/2019 20:10   Dg Abd 1 View  Result Date: 02/26/2019 CLINICAL DATA:  Nasogastric tube placement EXAM: ABDOMEN - 1 VIEW COMPARISON:  Portable exam 1305 hours compared to 1051 hours FINDINGS: Tip of nasogastric tube is above the carina and projects over the trachea, uncertain if within esophagus or airway. Enlargement of cardiac silhouette post pacemaker. Nonobstructive bowel gas pattern. Bones demineralized with compression deformities of T11, L2, L3, L4. IMPRESSION: Tip of nasogastric tube projects over the trachea above the carina, uncertain if within esophagus or trachea; recommend removal and replacement. Normal bowel gas pattern. Findings called to [facility] on 6N on 02/26/2019 at 1340 hours. Electronically Signed   By: Lavonia Dana M.D.   On: 02/26/2019 13:42   Dg Chest Port 1 View  Result Date: 02/26/2019 CLINICAL DATA:  85 year old female with history of NG tube placement. EXAM: PORTABLE CHEST 1 VIEW COMPARISON:  Chest x-ray 02/15/2019. FINDINGS: Tip of nasogastric tube in the mid esophagus shortly beyond the level of the thoracic inlet. Redundant tubing appears coiled in the upper esophagus. Bibasilar opacities which may reflect areas of atelectasis and/or consolidation. Patchy multifocal areas of interstitial prominence an ill-defined airspace disease in the lungs bilaterally (right greater than left), concerning for multilobar pneumonia. Small to moderate bilateral pleural effusions increased compared to the prior study. Mild cardiomegaly. Cephalization of the pulmonary vasculature. Aortic atherosclerosis. Left-sided pacemaker device in place with lead tips projecting over the expected location of the right ventricular apex. IMPRESSION: 1. Nasogastric tube tip is in the mid esophagus with redundant portion of the tube coiled in the proximal esophagus. 2. Marked worsening aeration throughout the lungs bilaterally, with asymmetrically distributed areas of  interstitial prominence and patchy multifocal ill-defined airspace disease concerning for developing multilobar bilateral pneumonia, with small to moderate bilateral pleural effusions which have increased compared to the prior examination. 3. Cardiomegaly with cephalization of the pulmonary vasculature, suggesting some degree of developing congestive heart failure. 4. Aortic atherosclerosis. Electronically Signed   By: Vinnie Langton M.D.   On: 02/26/2019 13:43    Anti-infectives: Anti-infectives (From admission, onward)   Start     Dose/Rate Route Frequency Ordered Stop   02/27/19 1300  metroNIDAZOLE (FLAGYL) IVPB 500 mg     500 mg 100 mL/hr over 60 Minutes Intravenous Every 8 hours 02/27/19 1251     02/25/19 1445  cefTRIAXone (ROCEPHIN) 1 g in sodium chloride 0.9 % 100 mL IVPB  Status:  Discontinued     1 g 200 mL/hr over 30 Minutes Intravenous Every 24 hours 02/25/19 1441 02/28/19 1121   02/22/19 1230  erythromycin 250 mg in sodium chloride 0.9 % 100 mL IVPB  Status:  Discontinued     250 mg 100 mL/hr over 60 Minutes Intravenous Every 6 hours 02/22/19 1130 02/28/19 1121       Assessment/Plan HTN CAD Pacemaker in place Pulmonary fibrosis/pulmonary HTN CKD A fib on eliquis (last dose 02/18/19) - eliquis on hold Code status DNR Right intertrochanteric femur fracture s/p IMN  02/17/19 by Dr. Lyla Glassing Hiatal hernia AKI - Cr up 1.3, UOP improved today UTI - Ucx grew Serratia marcescens, on rocephin 12/6>> Multifocal PNA - on rocephin  SBO vs Ileus -initially felt this morelikely ileus related to recent orthopedic surgery - multiple prior abdominal surgeries:Laparoscopic Left oophorectomyandlow anterior resection11/19/2012 by Dr. Barry Dienes for rectal cancer; appendectomy; surgery for ectopic pregnancy; vaginal hysterectomy -Started on erythromycin 250 mg IV every 6 for motility 12/3>> - passing some gas, bowel gas pattern improved on xray  ID -rocephin 12/6>>12/9, flagyl  12/8>>. WBC worsening 43.5, afebrile VTE -SCDs, eliquison hold/ok for heparin gtt FEN -IVF, sips/chips Foley -placed 12/1 for retention Follow up -TBD  Plan:Tolerating sips of clears, passing flatus, mild distension on exam. Check film today. If ok will advance to clear liquid tray. Continue mobilizing.    LOS: 8 days    [facility] 02/28/2019, 11:24 AM Please see Amion for pager number during day hours 7:00am-4:30pm

## 2019-02-28 NOTE — Progress Notes (Signed)
Physical Therapy Treatment Patient Details Name: Hannah Mercado MRN: [id number] DOB: 30-Oct-1922 Today's Date: 02/28/2019    History of Present Illness Pt is a 85 y/o female s/p R hip IM nail placement 02/17/19, was transferred to CIR then had 673mL coffee ground emesis with return to acute with SBO and ARF. PMH A-fib, s/p PPM, CAD, HLD, HTN, mitral valve disorder, pulmonary fibrosis, rectal CA, colon resection, peripheral neuropathy    PT Comments    Pt remains greatly limited with functional mobility secondary to pain, fatigue, R LE weakness, poor safety awareness and difficulty motor planning. Pt required heavy physical assistance of two for bed mobility and transfers. Continuing to recommend further intensive therapy services at CIR to maximize her independence with functional mobility prior to returning home with family support. PT will continue to follow to progress mobility as tolerated.    Follow Up Recommendations  CIR;Supervision/Assistance - 24 hour     Equipment Recommendations  Rolling walker with 5" wheels;3in1 (PT)    Recommendations for Other Services       Precautions / Restrictions Precautions Precautions: Fall Precaution Comments: watch O2 sats, knees buckle Restrictions Weight Bearing Restrictions: Yes RLE Weight Bearing: Weight bearing as tolerated    Mobility  Bed Mobility Overal bed mobility: Needs Assistance Bed Mobility: Supine to Sit     Supine to sit: Mod assist;+2 for physical assistance;HOB elevated     General bed mobility comments: increased time and effort, assistance needed moving R LE towards EOB and to position off of bed, assistance needed for trunk elevation and use of bed pads to position pt's hips  Transfers Overall transfer level: Needs assistance Equipment used: Rolling walker (2 wheeled) Transfers: Sit to/from [employer] Sit to Stand: Mod assist;+2 physical assistance   Squat pivot transfers: Max  assist;+2 physical assistance;+2 safety/equipment     General transfer comment: cueing for safe hand placement, stood from EOB with mod A x2 to power up and for stability; however, pt unable to achieve full upright position and then impulsive to pivot to chair, requiring max A x2 secondary to instability and poor safety awareness  Ambulation/Gait                 Stairs             Wheelchair Mobility    Modified Rankin (Stroke Patients Only)       Balance Overall balance assessment: Needs assistance Sitting-balance support: Feet supported Sitting balance-Leahy Scale: Fair     Standing balance support: Bilateral upper extremity supported;During functional activity Standing balance-Leahy Scale: Poor                              Cognition Arousal/Alertness: Awake/alert Behavior During Therapy: WFL for tasks assessed/performed Overall Cognitive Status: Impaired/Different from baseline Area of Impairment: Following commands;Safety/judgement                       Following Commands: Follows one step commands inconsistently Safety/Judgement: Decreased awareness of safety            Exercises      General Comments        Pertinent Vitals/Pain Pain Assessment: Faces Faces Pain Scale: Hurts little more Pain Location: R LE with movement Pain Descriptors / Indicators: Grimacing;Discomfort Pain Intervention(s): Monitored during session;Repositioned    Home Living  Prior Function            PT Goals (current goals can now be found in the care plan section) Acute Rehab PT Goals PT Goal Formulation: With patient Time For Goal Achievement: 02/26/2019 Potential to Achieve Goals: Good Progress towards PT goals: Progressing toward goals    Frequency    Min 4X/week      PT Plan Current plan remains appropriate    Co-evaluation              AM-PAC PT "6 Clicks" Mobility   Outcome Measure   Help needed turning from your back to your side while in a flat bed without using bedrails?: A Lot Help needed moving from lying on your back to sitting on the side of a flat bed without using bedrails?: A Lot Help needed moving to and from a bed to a chair (including a wheelchair)?: A Lot Help needed standing up from a chair using your arms (e.g., wheelchair or bedside chair)?: A Lot Help needed to walk in hospital room?: A Lot Help needed climbing 3-5 steps with a railing? : Total 6 Click Score: 11    End of Session Equipment Utilized During Treatment: Gait belt Activity Tolerance: Patient limited by pain;Patient limited by fatigue Patient left: in chair;with call bell/phone within reach;with chair alarm set Nurse Communication: Mobility status;Precautions PT Visit Diagnosis: Difficulty in walking, not elsewhere classified (R26.2);Muscle weakness (generalized) (M62.81);History of falling (Z91.81);Unsteadiness on feet (R26.81)     Time: 1203-1221 PT Time Calculation (min) (ACUTE ONLY): 18 min  Charges:  $Therapeutic Activity: 8-22 mins                     Anastasio Champion, DPT  Acute Rehabilitation Services Pager [phone] Office [place] 02/28/2019, 3:39 PM

## 2019-02-28 NOTE — Progress Notes (Addendum)
PROGRESS NOTE    Micca Matura  AOZ:[id number] DOB: [birth year] DOA: 03/17/2019 PCP: Patient, No Pcp Per    Brief Narrative:  85 year old female who presented with nausea and vomiting.  She does have significant past medical history for atrial fibrillation with complete heart block status post pacemaker, coronary artery disease, and rectal cancer.  Patient had a recent right hip fracture s/p surgical intervention November 28, she was discharged to inpatient rehab.  On December 1 she had to be transferred back to the hospital due to coffee-ground emesis, abdominal distention and abdominal pain.  Work-up with abdominal films showed small bowel obstruction.  On her initial physical examination blood pressure was 128/56, heart rate 63, temperature 98.3, oxygen saturation 98%, her lungs are clear to auscultation, heart S1-S2 present rhythm, abdomen was distended, no guarding or rigidity, no lower extremity edema. CT of the abdomen and pelvis showed changes consistent with small bowel obstruction in the right deep pelvis likely related to additions.  Patient was admitted to the hospital with the working diagnosis of small bowel obstruction.  Patient has been managed conservatively, with intravenous fluids, azithromycin and nothing by mouth.  Difficult to place nasogastric tube.  She has been diagnosed with multifocal pneumonia and a urinary tract infection, receiving antibiotic therapy with no major complications.  Her kidney function has been worsening.  Assessment & Plan:   Principal Problem:   Small bowel obstruction (HCC) Active Problems:   Hypertension   Persistent atrial fibrillation (HCC)   Pulmonary hypertension (HCC)   Protein-calorie malnutrition, severe   Vomiting   ARF (acute renal failure) (HCC)   SBO (small bowel obstruction) ([place])   Palliative care by specialist   Goals of care, counseling/discussion   Advanced care planning/counseling discussion   Acute renal failure  superimposed on chronic kidney disease ([place])   HCAP (healthcare-associated pneumonia)   1. Postoperative ileus with small bowel obstruction. Patient is tolerating clears, no nausea or vomiting. Continue to have abdominal distention. Continue supportive medical therapy.   2. Acute on chronic hypoxic respiratory failure, due to multifocal pneumonia, and acute pulmonary edema (present on admission). Patient with persistent tachypnea, her wbc is now up to 43 from 28. Chest film personally reviewed noted bilateral infiltrates and small bilateral pleural effusions. Will change antibiotic therapy to Zoysn and will trial diuresis with furosemide. Continue oxymetry monitoring. Continue bronchodilator therapy as needed.   Patient is at high risk for worsening hypoxic respiratory failure.   3. Urine infection, serratia, present on admission.  Patient has been on ceftriaxone with worsening leukocytosis, will change to Zosyn, continue to follow cell count.   4. AKI on CKD stage 3b/ hypernatremia and hyperkalemia/ with anemia of chronic renal disease. Worsening renal function, with serum cr up to 1,3, K 3,7 and serum bicarbonate is 24, positive fluid balance since admission, will do a new trial of diuresis and will follow on renal panel in am.  Hgb continue to be stable.   5. Atrial fibrillation. Continue with rate control.   6. Hip fracture sp repair 11/20. Patient very deconditioned, will need further therapy.   7. Unspecified calorie protein malnutrition. Will continue with nutritional supplements.   DVT prophylaxis: enoxaparin   Code Status:  dnr  Family Communication: no family at the bedside Disposition Plan/ discharge barriers:  Pending clinical improvement.   Body mass index is 20.68 kg/m. Malnutrition Type:      Malnutrition Characteristics:      Nutrition Interventions:     RN Pressure Injury  Documentation:     Consultants:   Surgery   Procedures:     Antimicrobials:    Zosyn     Subjective: Patient continue to be very weak and deconditioned, has noted this am to have increased respiratory rate, but no chest pain. Reports dyspnea with movement. No nausea or vomiting, and no significant abdominal pain.   Objective: Vitals:   02/27/19 2009 02/28/19 0431 02/28/19 0733 02/28/19 0809  BP: (!) 123/49 (!) 124/55  (!) 125/51  Pulse: 70 63  64  Resp: (!) 21 (!) 22  (!) 32  Temp: 98.7 F (37.1 C) 98.4 F (36.9 C)  98.4 F (36.9 C)  TempSrc: Oral Oral  Oral  SpO2: 95% 91% 96% 94%  Weight:      Height:        Intake/Output Summary (Last 24 hours) at 02/28/2019 0819 Last data filed at 02/28/2019 0500 Gross per 24 hour  Intake 1904.6 ml  Output 800 ml  Net 1104.6 ml   Filed Weights   02/24/19 0524 02/25/19 0500 02/26/19 0500  Weight: 58.2 kg 59.7 kg 59.9 kg    Examination:   General: deconditioned and ill looking appearing.  Neurology: Awake and alert, non focal  E ENT: positive pallor, no icterus, oral mucosa moist Cardiovascular: No JVD. S1-S2 present, rhythmic, no gallops, rubs, or murmurs. No lower extremity edema. Pulmonary: decreased breath sounds bilaterally at base, no wheezing, or rhonchi, positive bilateral rales. Gastrointestinal. Abdomen mild distended and tympanic with no organomegaly, no rebound or guarding Skin. No rashes Musculoskeletal: no joint deformities     Data Reviewed: I have personally reviewed following labs and imaging studies  CBC: Recent Labs  Lab 02/24/19 0212 02/25/19 0248 02/26/19 0226 02/27/19 0313 02/28/19 0251  WBC 16.8* 22.5* 21.9* 28.2* 43.5*  NEUTROABS  --   --   --  25.1* 40.7*  HGB 8.1* 7.7* 7.7* 8.3* 7.9*  HCT 26.3* 25.3* 26.1* 27.9* 25.9*  MCV 107.3* 109.5* 111.1* 112.0* 111.6*  PLT 301 317 361 402* 185   Basic Metabolic Panel: Recent Labs  Lab 02/24/19 0212 02/25/19 0248 02/25/19 0919 02/26/19 0226 02/27/19 0313 02/28/19 0251  NA 147* 146* 145 148* 146* 145  K 3.5 3.8 3.8 3.9  4.2 3.7  CL 106 106 109 109 108 107  CO2 31 29 29 28 28 24   GLUCOSE 162* 126* 132* 123* 136* 207*  BUN 48* 42* 38* 34* 41* 40*  CREATININE 0.97 0.97 0.96 0.92 1.31* 1.30*  CALCIUM 7.6* 7.6* 7.6* 7.5* 7.6* 7.4*  MG 2.7*  --   --   --   --   --    GFR: Estimated Creatinine Clearance: 25 mL/min (A) (by C-G formula based on SCr of 1.3 mg/dL (H)). Liver Function Tests: Recent Labs  Lab 02/22/19 1121 02/25/19 0919 02/27/19 0313 02/28/19 0251  AST 36 41 28 21  ALT 12 25 21 17   ALKPHOS 47 60 63 69  BILITOT 1.8* 1.2 1.2 1.1  PROT 5.7* 5.5* 6.0* 5.3*  ALBUMIN 2.5* 2.3* 2.4* 2.0*   No results for input(s): LIPASE, AMYLASE in the last 168 hours. No results for input(s): AMMONIA in the last 168 hours. Coagulation Profile: No results for input(s): INR, PROTIME in the last 168 hours. Cardiac Enzymes: Recent Labs  Lab 02/25/19 0919  CKTOTAL 71  CKMB 1.8   BNP (last 3 results) No results for input(s): PROBNP in the last 8760 hours. HbA1C: No results for input(s): HGBA1C in the last 72 hours. CBG: Recent  Labs  Lab 02/26/19 1209 02/26/19 2326 02/27/19 0603 02/28/19 0139 02/28/19 0530  GLUCAP 133* 128* 111* 190* 168*   Lipid Profile: No results for input(s): CHOL, HDL, LDLCALC, TRIG, CHOLHDL, LDLDIRECT in the last 72 hours. Thyroid Function Tests: No results for input(s): TSH, T4TOTAL, FREET4, T3FREE, THYROIDAB in the last 72 hours. Anemia Panel: No results for input(s): VITAMINB12, FOLATE, FERRITIN, TIBC, IRON, RETICCTPCT in the last 72 hours.    Radiology Studies: I have reviewed all of the imaging during this hospital visit personally     Scheduled Meds: . bisacodyl  10 mg Rectal Daily  . Chlorhexidine Gluconate Cloth  6 each Topical Daily  . furosemide  40 mg Intravenous Once  . lip balm  1 application Topical BID  . pantoprazole (PROTONIX) IV  40 mg Intravenous Q12H   Continuous Infusions: . cefTRIAXone (ROCEPHIN)  IV 1 g (02/27/19 1313)  . erythromycin 250  mg (02/28/19 0459)  . metronidazole 500 mg (02/28/19 0600)     LOS: 8 days        Mauricio Gerome Apley, MD

## 2019-02-28 NOTE — Progress Notes (Signed)
Palliative:   Patient asleep, noted to have increased RR. Noted WBC doubled since yesterday- primary team starting patient on zosyn. Per RN pt did sit up in chair today.   Assessment/Plan:   -85 yo patient recovering from postop illeus, however, now with pneumonia likely from aspiration -Appears may be worsening- if no turn around in next 24 hours will contact patient's daughter to again discuss [place]- I am concerned she may be approaching EOL if pneumonia is not improving  Mariana Kaufman, AGNP-C Palliative Medicine  Please call Palliative Medicine team phone with any questions [phone]. For individual providers please see AMION.  Total time: 25 minutes

## 2019-02-28 NOTE — Progress Notes (Signed)
OT Cancellation Note  Patient Details Name: Hannah Mercado MRN: [id number] DOB: September 06, 1922   Cancelled Treatment:    Reason Eval/Treat Not Completed: Fatigue/lethargy limiting ability to participate;Other (comment) Pt asleep upon OT arrival, politely declining OT session as she had just gotten back in bed from sitting in chair. Will check back as time allows.  Lanier Clam., COTA/L Acute Rehabilitation Services [phone] [place] 02/28/2019, 2:23 PM

## 2019-02-28 NOTE — Progress Notes (Addendum)
Initial Nutrition Assessment  DOCUMENTATION CODES:   Not applicable  INTERVENTION:   -Boost Breeze po TID, each supplement provides 250 kcal and 9 grams of protein -MVI with minerals -RD will follow for diet advancement and supplement as appropriate -Pt has been without adequate nutrition since admission ( 8 days). If prolonged NPO/ clear liquid diet status is anticipated, consider initiation of nutrition support  NUTRITION DIAGNOSIS:   Inadequate oral intake related to altered GI function as evidenced by NPO status.  GOAL:   Patient will meet greater than or equal to 90% of their needs  MONITOR:   Diet advancement, Labs, Weight trends, Skin, I & O's  REASON FOR ASSESSMENT:   NPO/Clear Liquid Diet    ASSESSMENT:   Hannah Mercado is a 85 y.o. female with history of A. fib complete heart block status post pacemaker placement on apixaban, CAD, rectal cancer status post LAR by Dr. Barry Dienes had recent surgery for right hip and was transferred to rehab yesterday was found to have 600 mL of coffee-ground vomitus.  Not have any abdominal pain.  Patient had acute abdominal series done which shows small bowel obstruction and we were consulted for further management.  Labs was initially showing potassium of 6 for which patient was given Kayexalate.  Has not had any bowel movements after the Kayexalate.  Repeat labs show creatinine has worsened to 2.3 potassium is improved to 5.3 hemoglobin 9.4 platelets 262.  Patient is admitted for further management of small bowel obstruction with acute renal failure and possible hematemesis.  On exam initially patient was mildly lethargic but was following commands.  Pt admitted with SBO.   12/1- NGT inserted 12/6- NGT out 12/7- NGT replaced 12/8- NGT out, advanced to sips and chips  Reviewed I/O's: +1.1 L x 24 hours and +2.8L since admission  UOP: 800 ml x 24 hours  Per general surgery notes, likely advancement to clear liquid diet today.  Noted pt has been without significant nutrition throughout hospital admission ( 8 days). No indications for surgical interventions at this time. Pt refusing further NGT placements.   Attempted to speak with pt via phone, however, no answer. RD unable to obtain further nutrition-related history at this time.   Pt familiar to this RD due to prior admission at end of November 2020, at which pt was identified with severe malnutrition in the setting of chronic illness. Pt has experienced a general decline in health over the past year secondary to back trouble, which she treated conservatively, but intake and mobility were limited secondary to pain. Pt lived with her daughter earlier this year prior to the COVID-19 pandemic for additional support and improve nutritional status. She was maintaining wt of 120-125# over the past year (pt's lowest wt was 96# and was usually around 140# prior to illness).   Noted pt with 28% wt gain over the past year, however, suspect some may be related to fluid status (net +2.8 L since admission).   Suspect malnutrition is ongoing, however, RD unable to identify at this time.   Palliative care following for goals of care discussions.   Labs reviewed: CBGS: 111-190.   Diet Order:   Diet Order            Diet NPO time specified Except for: Ice Chips, Sips with Meds, Other (See Comments)  Diet effective now              EDUCATION NEEDS:   No education needs have been identified at  this time  Skin:  Skin Assessment: Skin Integrity Issues: Skin Integrity Issues:: Incisions Incisions: closed rt leg  Last BM:  02/24/19  Height:   Ht Readings from Last 1 Encounters:  02/21/19 5\' 7"  (1.702 m)    Weight:   Wt Readings from Last 1 Encounters:  02/26/19 59.9 kg    Ideal Body Weight:  61.4 kg  BMI:  Body mass index is 20.68 kg/m.  Estimated Nutritional Needs:   Kcal:  1600-1800  Protein:  75-90 grams  Fluid:  > 1.6 L   Hannah Mercado A. Hannah Mercado, RD,  LDN, [place] Registered Dietitian II Certified Diabetes Care and Education Specialist Pager: [phone] After hours Pager: [phone]

## 2019-03-01 LAB — CBC WITH DIFFERENTIAL/PLATELET
Abs Immature Granulocytes: 0 10*3/uL (ref 0.00–0.07)
Basophils Absolute: 0 10*3/uL (ref 0.0–0.1)
Basophils Relative: 0 %
Eosinophils Absolute: 0 10*3/uL (ref 0.0–0.5)
Eosinophils Relative: 0 %
HCT: 30.4 % — ABNORMAL LOW (ref 36.0–46.0)
Hemoglobin: 9 g/dL — ABNORMAL LOW (ref 12.0–15.0)
Lymphocytes Relative: 2 %
Lymphs Abs: 0.7 10*3/uL (ref 0.7–4.0)
MCH: 33.8 pg (ref 26.0–34.0)
MCHC: 29.6 g/dL — ABNORMAL LOW (ref 30.0–36.0)
MCV: 114.3 fL — ABNORMAL HIGH (ref 80.0–100.0)
Monocytes Absolute: 0.3 10*3/uL (ref 0.1–1.0)
Monocytes Relative: 1 %
Neutro Abs: 33.4 10*3/uL — ABNORMAL HIGH (ref 1.7–7.7)
Neutrophils Relative %: 97 %
Platelets: 347 10*3/uL (ref 150–400)
RBC: 2.66 MIL/uL — ABNORMAL LOW (ref 3.87–5.11)
RDW: 16.2 % — ABNORMAL HIGH (ref 11.5–15.5)
WBC: 34.4 10*3/uL — ABNORMAL HIGH (ref 4.0–10.5)
nRBC: 0.7 % — ABNORMAL HIGH (ref 0.0–0.2)
nRBC: 2 /100 WBC — ABNORMAL HIGH

## 2019-03-01 LAB — GLUCOSE, CAPILLARY
Glucose-Capillary: 108 mg/dL — ABNORMAL HIGH (ref 70–99)
Glucose-Capillary: 120 mg/dL — ABNORMAL HIGH (ref 70–99)
Glucose-Capillary: 159 mg/dL — ABNORMAL HIGH (ref 70–99)

## 2019-03-01 LAB — BASIC METABOLIC PANEL
Anion gap: 14 (ref 5–15)
BUN: 48 mg/dL — ABNORMAL HIGH (ref 8–23)
CO2: 24 mmol/L (ref 22–32)
Calcium: 7.5 mg/dL — ABNORMAL LOW (ref 8.9–10.3)
Chloride: 107 mmol/L (ref 98–111)
Creatinine, Ser: 1.68 mg/dL — ABNORMAL HIGH (ref 0.44–1.00)
GFR calc Af Amer: 30 mL/min — ABNORMAL LOW (ref >60)
GFR calc non Af Amer: 26 mL/min — ABNORMAL LOW (ref >60)
Glucose, Bld: 131 mg/dL — ABNORMAL HIGH (ref 70–99)
Potassium: 3.4 mmol/L — ABNORMAL LOW (ref 3.5–5.1)
Sodium: 145 mmol/L (ref 135–145)

## 2019-03-01 MED ORDER — POLYETHYLENE GLYCOL 3350 17 G PO PACK: 17.0000 g | PACK | Freq: Every day | ORAL | Status: DC | PRN

## 2019-03-01 MED ORDER — DIPHENHYDRAMINE HCL 25 MG PO CAPS
25.0000 mg | ORAL_CAPSULE | Freq: Four times a day (QID) | ORAL | Status: DC | PRN
  Administered 2019-03-01: 25 mg via ORAL
  Filled 2019-03-01: qty 1

## 2019-03-01 MED ORDER — DOCUSATE SODIUM 100 MG PO CAPS
100.0000 mg | ORAL_CAPSULE | Freq: Two times a day (BID) | ORAL | Status: DC
  Administered 2019-03-01 – 2019-03-02 (×3): 100 mg via ORAL
  Filled 2019-03-01 (×3): qty 1

## 2019-03-01 NOTE — Progress Notes (Signed)
Inpatient Rehab Admissions Coordinator:   Pt now with PNA.  Bowel function improving, and diet being advanced as tolerated.  Pt declining mobility/therapy more frequently.  Palliative on board and discussing [place] and EOL with family.  Will step back and follow from a distance.    Shann Medal, PT, DPT Admissions Coordinator [phone] 03/01/19  2:35 PM

## 2019-03-01 NOTE — Progress Notes (Signed)
[facility] Progress Note     Subjective: CC-  Patient states that she had a bad night. She just feels bad but cannot elaborate. Denies any abdominal pain, nausea, vomiting. Passing flatus. Tolerating clears. States that she has no appetite but is enjoying some coffee. States that she's exhausted and thinks it may be time to go to [place].  Objective: Vital signs in last 24 hours: Temp:  [98 F (36.7 C)-98.5 F (36.9 C)] 98.3 F (36.8 C) (12/10 0334) Pulse Rate:  [60-70] 63 (12/10 0334) Resp:  [25-34] 26 (12/10 0334) BP: (106-121)/(45-59) 106/53 (12/10 0334) SpO2:  [94 %-98 %] 96 % (12/10 0334) Last BM Date: 02/24/19  Intake/Output from previous day: 12/09 0701 - 12/10 0700 In: 240 [P.O.:240] Out: 650 [Urine:650] Intake/Output this shift: No intake/output data recorded.  PE: Gen: Alert, NAD, pleasant HEENT: EOM's intact, pupils equal and round Card: RRR Pulm:tachypneic, decreased breath sounds bilateral bases, no wheezing or rhonchi Abd: Soft,mild distension, nontender, +BS, no HSM Skin: warm and dry   Lab Results:  Recent Labs    02/27/19 0313 02/28/19 0251  WBC 28.2* 43.5*  HGB 8.3* 7.9*  HCT 27.9* 25.9*  PLT 402* 371   BMET Recent Labs    02/27/19 0313 02/28/19 0251  NA 146* 145  K 4.2 3.7  CL 108 107  CO2 28 24  GLUCOSE 136* 207*  BUN 41* 40*  CREATININE 1.31* 1.30*  CALCIUM 7.6* 7.4*   PT/INR No results for input(s): LABPROT, INR in the last 72 hours. CMP     Component Value Date/Time   NA 145 02/28/2019 0251   K 3.7 02/28/2019 0251   CL 107 02/28/2019 0251   CO2 24 02/28/2019 0251   GLUCOSE 207 (H) 02/28/2019 0251   BUN 40 (H) 02/28/2019 0251   CREATININE 1.30 (H) 02/28/2019 0251   CREATININE 1.08 (H) 10/09/2015 1130   CALCIUM 7.4 (L) 02/28/2019 0251   PROT 5.3 (L) 02/28/2019 0251   ALBUMIN 2.0 (L) 02/28/2019 0251   AST 21 02/28/2019 0251   ALT 17 02/28/2019 0251   ALKPHOS 69 02/28/2019 0251   BILITOT 1.1 02/28/2019  0251   GFRNONAA 35 (L) 02/28/2019 0251   GFRAA 41 (L) 02/28/2019 0251   Lipase  No results found for: LIPASE     Studies/Results: DG Abd Portable 1V  Result Date: 02/28/2019 CLINICAL DATA:  Small bowel obstruction. EXAM: PORTABLE ABDOMEN - 1 VIEW COMPARISON:  02/27/2019 FINDINGS: The enteric tube which terminated in the region of the distal esophagus on the prior study is no longer visible. No dilated loops of bowel are seen to suggest obstruction. Cardiomegaly and a pacemaker are noted. L2, L3, and L4 compression fractures are again seen. IMPRESSION: No evidence of bowel obstruction. Electronically Signed   By: Logan Bores M.D.   On: 02/28/2019 12:04    Anti-infectives: Anti-infectives (From admission, onward)   Start     Dose/Rate Route Frequency Ordered Stop   02/28/19 1200  piperacillin-tazobactam (ZOSYN) IVPB 3.375 g     3.375 g 12.5 mL/hr over 240 Minutes Intravenous Every 8 hours 02/28/19 1131     02/27/19 1300  metroNIDAZOLE (FLAGYL) IVPB 500 mg  Status:  Discontinued     500 mg 100 mL/hr over 60 Minutes Intravenous Every 8 hours 02/27/19 1251 02/28/19 1131   02/25/19 1445  cefTRIAXone (ROCEPHIN) 1 g in sodium chloride 0.9 % 100 mL IVPB  Status:  Discontinued     1 g 200 mL/hr over 30 Minutes  Intravenous Every 24 hours 02/25/19 1441 02/28/19 1121   02/22/19 1230  erythromycin 250 mg in sodium chloride 0.9 % 100 mL IVPB  Status:  Discontinued     250 mg 100 mL/hr over 60 Minutes Intravenous Every 6 hours 02/22/19 1130 02/28/19 1121       Assessment/Plan HTN CAD Pacemaker in place Pulmonary fibrosis/pulmonary HTN CKD A fib on eliquis (last dose 02/18/19) - eliquison hold Code status DNR Right intertrochanteric femur fracture s/p IMN 02/17/19 by Dr. Lyla Glassing Hiatal hernia AKI UTI  Multifocal PNA   SBO vs Ileus -initially felt this morelikely ileus related to recent orthopedic surgery - multiple prior abdominal surgeries:Laparoscopic Left  oophorectomyandlow anterior resection11/19/2012 by Dr. Barry Dienes for rectal cancer; appendectomy; surgery for ectopic pregnancy; vaginal hysterectomy -Started on erythromycin 250 mg IV every 6 for motility 12/3>>12/9 - passing gas, bowel gas pattern improved on xray  ID -rocephin 12/6>>12/9, flagyl 12/8>>12/9, zosyn 12/9>> VTE -SCDs, eliquison hold/ok for heparin gtt FEN -IVF,FLD, bowel regimen Foley -placed 12/1 for retention Follow up -TBD  Plan:Bowels seems to be improving. [place] for full liquids, continue bowel regimen. Enocurage mobilization as tolerated. Palliative following for GOC.   LOS: 9 days    [facility] 03/01/2019, 9:52 AM Please see Amion for pager number during day hours 7:00am-4:30pm

## 2019-03-01 NOTE — Progress Notes (Signed)
Pt asking for a sedative. States she is "miserable" and wants to rest. Pt has received Tylenol,a breathing treatment, and been repositioned,but unable to get comfortable. She says her stomach "is not really hurting" and denies nausea. She says she is not hurting,but she is "exhausted." Lung sounds diminished. Abdomen slightly distended;bowel sounds hypoactive.BP 106/53;P 63,T 98.3,RR-26; O2 sat 96% on 2lL [place]. [facility] on call clinician paged.

## 2019-03-01 NOTE — Progress Notes (Signed)
Nutrition Follow-up  DOCUMENTATION CODES:   Not applicable  INTERVENTION:   -D/c Boost Breeze po TID, each supplement provides 250 kcal and 9 grams of protein -RD will follow for diet advancement and supplement as appropriate -Pt has been without adequate nutrition since admission (9 days). If prolonged NPO/ clear liquid diet status is anticipated, consider initiation of nutrition support if this is within pt's goals of care  NUTRITION DIAGNOSIS:   Inadequate oral intake related to altered GI function as evidenced by NPO status.  Ongoing  GOAL:   Patient will meet greater than or equal to 90% of their needs  Unmet  MONITOR:   Diet advancement, Labs, Weight trends, Skin, I & O's  REASON FOR ASSESSMENT:   NPO/Clear Liquid Diet    ASSESSMENT:   Hannah Mercado is a 85 y.o. female with history of A. fib complete heart block status post pacemaker placement on apixaban, CAD, rectal cancer status post LAR by Dr. Barry Dienes had recent surgery for right hip and was transferred to rehab yesterday was found to have 600 mL of coffee-ground vomitus.  Not have any abdominal pain.  Patient had acute abdominal series done which shows small bowel obstruction and we were consulted for further management.  Labs was initially showing potassium of 6 for which patient was given Kayexalate.  Has not had any bowel movements after the Kayexalate.  Repeat labs show creatinine has worsened to 2.3 potassium is improved to 5.3 hemoglobin 9.4 platelets 262.  Patient is admitted for further management of small bowel obstruction with acute renal failure and possible hematemesis.  On exam initially patient was mildly lethargic but was following commands.  12/1- NGT inserted 12/6- NGT out 12/7- NGT replaced 12/8- NGT out, advanced to sips and chips  Pt downgraded to NPO today. Noted pt has been without significant nutrition throughout hospital admission ( 8 days). No indications for surgical interventions  at this time. Per general surgery notes, pt expressed concern that "it was time to see Jesus".   Palliative care team and hospitalist confirm poor prognosis. At this time, pt would like to continue IV antibiotics and IV fluids, but will take progress day by day prior to potential transition to full comfort care.   Labs reviewed: CBGS: 108-145.   Diet Order:   Diet Order            Diet NPO time specified  Diet effective now              EDUCATION NEEDS:   No education needs have been identified at this time  Skin:  Skin Assessment: Skin Integrity Issues: Skin Integrity Issues:: Incisions Incisions: closed rt leg  Last BM:  02/24/19  Height:   Ht Readings from Last 1 Encounters:  02/21/19 5\' 7"  (1.702 m)    Weight:   Wt Readings from Last 1 Encounters:  02/26/19 59.9 kg    Ideal Body Weight:  61.4 kg  BMI:  Body mass index is 20.68 kg/m.  Estimated Nutritional Needs:   Kcal:  1600-1800  Protein:  75-90 grams  Fluid:  > 1.6 L    Carman Auxier A. [name], RD, LDN, [place] Registered Dietitian II Certified Diabetes Care and Education Specialist Pager: [phone] After hours Pager: [phone]

## 2019-03-01 NOTE — Progress Notes (Signed)
Physical Therapy Progress Note for Charges    03/01/19 1400  PT Visit Information  Last PT Received On 03/01/19  PT General Charges  $$ ACUTE PT VISIT 1 Visit  PT Treatments  $Therapeutic Exercise 8-22 mins  Franco Duley A, PT, DPT  Acute Rehabilitation Services Pager [phone] Office [phone]   

## 2019-03-01 NOTE — Progress Notes (Signed)
Physical Therapy Treatment Patient Details Name: Hannah Mercado MRN: [id number] DOB: 1923-01-15 Today's Date: 03/01/2019    History of Present Illness Pt is a 85 y/o female s/p R hip IM nail placement 02/17/19, was transferred to CIR then had 644mL coffee ground emesis with return to acute with SBO and ARF. PMH A-fib, s/p PPM, CAD, HLD, HTN, mitral valve disorder, pulmonary fibrosis, rectal CA, colon resection, peripheral neuropathy    PT Comments    Pt admitted with above diagnosis. Pt states she had a bad night last night and "knows how much she can do," and declines mobility today. She was agreeable to performing some bed-level exercises as described below. Once asked to complete glute sets, she declined, stating she was too tired and couldn't do anymore. Pt educated on importance of mobility but continues to decline. Pt would benefit from continued skilled PT intervention in order to address deficits.   Follow Up Recommendations  CIR;Supervision/Assistance - 24 hour     Equipment Recommendations  Rolling walker with 5" wheels;3in1 (PT)    Recommendations for Other Services Rehab consult     Precautions / Restrictions Precautions Precautions: Fall Restrictions Weight Bearing Restrictions: Yes RLE Weight Bearing: Weight bearing as tolerated    Mobility  Bed Mobility               General bed mobility comments: declined bed mobility except for bed level exercise  Transfers                 General transfer comment: declined  Ambulation/Gait                 Stairs             Wheelchair Mobility    Modified Rankin (Stroke Patients Only)       Balance                                            Cognition Arousal/Alertness: Lethargic Behavior During Therapy: WFL for tasks assessed/performed Overall Cognitive Status: Within Functional Limits for tasks assessed                                         Exercises General Exercises - Lower Extremity Ankle Circles/Pumps: AROM;Both;15 reps;Supine Quad Sets: AROM;Both;10 reps;Supine Short Arc Quad: AROM;Both;10 reps;Supine Heel Slides: AAROM;Both;10 reps;Supine Hip ABduction/ADduction: AAROM;Both;10 reps;Supine    General Comments        Pertinent Vitals/Pain Pain Assessment: Faces Faces Pain Scale: Hurts little more Pain Location: R LE with movement Pain Descriptors / Indicators: Grimacing;Discomfort Pain Intervention(s): Limited activity within patient's tolerance;Monitored during session    Home Living                      Prior Function            PT Goals (current goals can now be found in the care plan section) Acute Rehab PT Goals Patient Stated Goal: to decrease pain PT Goal Formulation: With patient Time For Goal Achievement: 03/04/2019 Potential to Achieve Goals: Good Progress towards PT goals: Not progressing toward goals - comment(pt declined all mobility except bed-level exercise)    Frequency    Min 4X/week      PT Plan Current plan remains appropriate  Co-evaluation              AM-PAC PT "6 Clicks" Mobility   Outcome Measure  Help needed turning from your back to your side while in a flat bed without using bedrails?: A Lot Help needed moving from lying on your back to sitting on the side of a flat bed without using bedrails?: A Lot Help needed moving to and from a bed to a chair (including a wheelchair)?: A Lot Help needed standing up from a chair using your arms (e.g., wheelchair or bedside chair)?: A Lot Help needed to walk in hospital room?: A Lot Help needed climbing 3-5 steps with a railing? : Total 6 Click Score: 11    End of Session   Activity Tolerance: Patient limited by fatigue Patient left: in bed;with bed alarm set;with call bell/phone within reach Nurse Communication: Mobility status PT Visit Diagnosis: Difficulty in walking, not elsewhere classified  (R26.2);Muscle weakness (generalized) (M62.81);History of falling (Z91.81);Unsteadiness on feet (R26.81)     Time: 1116-1130 PT Time Calculation (min) (ACUTE ONLY): 14 min  Charges:  $Therapeutic Exercise: 8-22 mins                     Christel Mormon, SPT   [facility] [place] 03/01/2019, 2:05 PM

## 2019-03-01 NOTE — Progress Notes (Signed)
PROGRESS NOTE    Hannah Mercado  BSJ:[id number] DOB: 1922/08/13 DOA: 02/25/2019 PCP: Patient, No Pcp Per    Brief Narrative:  85 year old female who presented with nausea and vomiting.  She does have significant past medical history for atrial fibrillation with complete heart block status post pacemaker, coronary artery disease, and rectal cancer.  Patient had a recent right hip fracture s/p surgical intervention November 28, she was discharged to inpatient rehab.  On December 1 she had to be transferred back to the hospital due to coffee-ground emesis, abdominal distention and abdominal pain.  Work-up with abdominal films showed small bowel obstruction.  On her initial physical examination blood pressure was 128/56, heart rate 63, temperature 98.3, oxygen saturation 98%, her lungs are clear to auscultation, heart S1-S2 present rhythm, abdomen was distended, no guarding or rigidity, no lower extremity edema. CT of the abdomen and pelvis showed changes consistent with small bowel obstruction in the right deep pelvis likely related to additions.  Patient was admitted to the hospital with the working diagnosis of small bowel obstruction.  Patient has been managed conservatively, with intravenous fluids, azithromycin and nothing by mouth.  Difficult to place nasogastric tube.  She has been diagnosed with multifocal pneumonia and a urinary tract infection, receiving antibiotic therapy with no major complications.  Her kidney function and leukocytosis have been worsening. Continue to be very weak and deconditioned. Her bowel function is slowly recovering.    Assessment & Plan:   Principal Problem:   Small bowel obstruction (HCC) Active Problems:   Hypertension   Persistent atrial fibrillation (HCC)   Pulmonary hypertension (HCC)   Protein-calorie malnutrition, severe   Vomiting   ARF (acute renal failure) (HCC)   SBO (small bowel obstruction) ([place])   Palliative care by specialist    Goals of care, counseling/discussion   Advanced care planning/counseling discussion   Acute renal failure superimposed on chronic kidney disease ([place])   HCAP (healthcare-associated pneumonia)   Ileus, postoperative ([place])    1. Postoperative ileus with small bowel obstruction. Persistent ileus, patient had large emesis today, converted back to NPO per surgery recommendations. Continue to have mild distended abdomen. Abdominal film from yesterday with no air fluid levels. Follow with surgery recommendations. Continue bowel regimen, antiacids and as needed antiemetics.   2. Acute on chronic hypoxic respiratory failure, due to multifocal pneumonia, and acute pulmonary edema (present on admission). Continue to have tachypnea rr 26, oxygenation 96% on 2 LPM per [place],pending wbc for today but yesterday was significantly elevated, changed antibiotic therapy to zosyn. Continue bronchodilator therapy. Aspiration precautions.   Patient is very weak and deconditioned, she continue to have a high risk for worsening hypoxic respiratory failure.   3. Urinary  tract infection, serratia, present on admission.  She has received therapy with ceftriaxone. Now changed to Zosyn.  4. AKI on CKD stage 3b/ hypernatremia and hyperkalemia/ with anemia of chronic renal disease. Pending renal function for today, her urine output over last 24 H has been 650 ml, will hold on further diuresis for now. Will continue to avoid hypotension and nephrotoxic medications.   5. Atrial fibrillation. She has remained rate controlled, continue telemetry monitoring.   6. Hip fracture sp repair 11/20. Continue to be very weak and deconditioned, she has a very poor prognosis.    7. Unspecified calorie protein malnutrition. Patient back to NPO today.   DVT prophylaxis: enoxaparin   Code Status:  dnr  Family Communication: I spoke over the phone with the patient's daughter  about patient's  condition, plan of care and all  questions were addressed.  Her daughter is aware patient is declining and has poor prognosis, will continue current aggressive medical therapy with IV antibiotics, and close monitor of renal and intestinal function.   Disposition Plan/ discharge barriers:  Patient with very poor prognosis, progressive decline.    Body mass index is 20.68 kg/m. Malnutrition Type:  Nutrition Problem: Inadequate oral intake Etiology: altered GI function   Malnutrition Characteristics:  Signs/Symptoms: NPO status   Nutrition Interventions:  Interventions: Refer to RD note for recommendations  RN Pressure Injury Documentation:     Consultants:   Surgery  Procedures:     Antimicrobials:   Zosyn     Subjective: Patient continue to be very weak and deconditioned, denies any chest pain or significant dyspnea at rest. Had a large emesis this am.   Objective: Vitals:   02/28/19 1608 02/28/19 2007 02/28/19 2343 03/01/19 0334  BP: (!) 106/45 (!) 118/59 (!) 121/56 (!) 106/53  Pulse: 61 65 70 63  Resp: (!) 29 (!) 25 (!) 26 (!) 26  Temp: 98.5 F (36.9 C) 98.2 F (36.8 C) 98 F (36.7 C) 98.3 F (36.8 C)  TempSrc: Oral Oral Axillary Oral  SpO2: 98% 96% 95% 96%  Weight:      Height:        Intake/Output Summary (Last 24 hours) at 03/01/2019 1045 Last data filed at 03/01/2019 1018 Gross per 24 hour  Intake 240 ml  Output 1450 ml  Net -1210 ml   Filed Weights   02/24/19 0524 02/25/19 0500 02/26/19 0500  Weight: 58.2 kg 59.7 kg 59.9 kg    Examination:   General: very weak and deconditioned, no nausea or vomiting.  Neurology: Awake and alert, non focal  E ENT: positive pallor, no icterus, oral mucosa moist Cardiovascular: No JVD. S1-S2 present, rhythmic, no gallops, rubs, or murmurs. No lower extremity edema. Pulmonary: positive breath sounds bilaterally,decreased air movement, no wheezing,or rhonchi, but bilateral rales. Gastrointestinal. Abdomen soft, mild distention, with  no organomegaly, non tender, no rebound or guarding Skin. No rashes Musculoskeletal: no joint deformities     Data Reviewed: I have personally reviewed following labs and imaging studies  CBC: Recent Labs  Lab 02/24/19 0212 02/25/19 0248 02/26/19 0226 02/27/19 0313 02/28/19 0251  WBC 16.8* 22.5* 21.9* 28.2* 43.5*  NEUTROABS  --   --   --  25.1* 40.7*  HGB 8.1* 7.7* 7.7* 8.3* 7.9*  HCT 26.3* 25.3* 26.1* 27.9* 25.9*  MCV 107.3* 109.5* 111.1* 112.0* 111.6*  PLT 301 317 361 402* 496   Basic Metabolic Panel: Recent Labs  Lab 02/24/19 0212 02/25/19 0248 02/25/19 0919 02/26/19 0226 02/27/19 0313 02/28/19 0251  NA 147* 146* 145 148* 146* 145  K 3.5 3.8 3.8 3.9 4.2 3.7  CL 106 106 109 109 108 107  CO2 31 29 29 28 28 24   GLUCOSE 162* 126* 132* 123* 136* 207*  BUN 48* 42* 38* 34* 41* 40*  CREATININE 0.97 0.97 0.96 0.92 1.31* 1.30*  CALCIUM 7.6* 7.6* 7.6* 7.5* 7.6* 7.4*  MG 2.7*  --   --   --   --   --    GFR: Estimated Creatinine Clearance: 25 mL/min (A) (by C-G formula based on SCr of 1.3 mg/dL (H)). Liver Function Tests: Recent Labs  Lab 02/22/19 1121 02/25/19 0919 02/27/19 0313 02/28/19 0251  AST 36 41 28 21  ALT 12 25 21 17   ALKPHOS 47 60 63 69  BILITOT 1.8* 1.2 1.2 1.1  PROT 5.7* 5.5* 6.0* 5.3*  ALBUMIN 2.5* 2.3* 2.4* 2.0*   No results for input(s): LIPASE, AMYLASE in the last 168 hours. No results for input(s): AMMONIA in the last 168 hours. Coagulation Profile: No results for input(s): INR, PROTIME in the last 168 hours. Cardiac Enzymes: Recent Labs  Lab 02/25/19 0919  CKTOTAL 71  CKMB 1.8   BNP (last 3 results) No results for input(s): PROBNP in the last 8760 hours. HbA1C: No results for input(s): HGBA1C in the last 72 hours. CBG: Recent Labs  Lab 02/28/19 0139 02/28/19 0530 02/28/19 1201 02/28/19 2342 03/01/19 0653  GLUCAP 190* 168* 145* 139* 108*   Lipid Profile: No results for input(s): CHOL, HDL, LDLCALC, TRIG, CHOLHDL, LDLDIRECT  in the last 72 hours. Thyroid Function Tests: No results for input(s): TSH, T4TOTAL, FREET4, T3FREE, THYROIDAB in the last 72 hours. Anemia Panel: No results for input(s): VITAMINB12, FOLATE, FERRITIN, TIBC, IRON, RETICCTPCT in the last 72 hours.    Radiology Studies: I have reviewed all of the imaging during this hospital visit personally     Scheduled Meds: . bisacodyl  10 mg Rectal Daily  . Chlorhexidine Gluconate Cloth  6 each Topical Daily  . docusate sodium  100 mg Oral BID  . feeding supplement  1 Container Oral TID BM  . lip balm  1 application Topical BID  . multivitamin with minerals  1 tablet Oral Daily  . pantoprazole (PROTONIX) IV  40 mg Intravenous Q12H   Continuous Infusions: . piperacillin-tazobactam (ZOSYN)  IV 3.375 g (03/01/19 0329)     LOS: 9 days         Gerome Apley, MD

## 2019-03-01 NOTE — Progress Notes (Signed)
Pt did not sleep after receiving Benadryl . Grimacing,almost crying,she states she is not having pain,but just feels terrible. Notified Lynnell Chad NP. No new orders.

## 2019-03-01 NOTE — Progress Notes (Signed)
Daily Progress Note   Patient Name: Hannah Mercado       Date: 03/01/2019 DOB: 1922/09/20  Age: 85 y.o. MRN#: [id number] Attending Physician: Tawni Millers Primary Care Physician: Patient, No Pcp Per Admit Date: 03/16/2019  Reason for Consultation/Follow-up: Establishing goals of care  Subjective: Noted note from surgery PA noting patient stated she was feeling it might be time to go see Jesus.  I met with patient- discussed her statement. Discussed with her possibilities of transitioning to comfort care, stopping aggressive medical care, and allowing her to go see Jesus in comfort surrounded by her family.  Hannah Mercado stated she "feels better" today, she's not ready to transition to full comfort just yet. She wishes to continue IV antibiotics and IV fluids.  I called and spoke with [name]- her daughter. Expressed my concern regarding's patient's illness trajectory. Her breathing appears better today than yesterday- however, I do get concerned when patient's mention "going to see Jesus".  [name] remains very reasonable and states as long as her Mom is desiring to "keep trying" and electing to receive aggressive medical care- then she supports this.  We agreed to re-eval patient's status tomorrow and revisit GOC daily.   Review of Systems  Constitutional: Positive for malaise/fatigue.    Length of Stay: 9  Current Medications: Scheduled Meds:  . bisacodyl  10 mg Rectal Daily  . Chlorhexidine Gluconate Cloth  6 each Topical Daily  . docusate sodium  100 mg Oral BID  . feeding supplement  1 Container Oral TID BM  . lip balm  1 application Topical BID  . multivitamin with minerals  1 tablet Oral Daily  . pantoprazole (PROTONIX) IV  40 mg Intravenous Q12H    Continuous  Infusions: . piperacillin-tazobactam (ZOSYN)  IV 3.375 g (03/01/19 0329)    PRN Meds: acetaminophen, albuterol, alum & mag hydroxide-simeth, diphenhydrAMINE, hydrALAZINE, magic mouthwash, menthol-cetylpyridinium, ondansetron **OR** ondansetron (ZOFRAN) IV, phenol, polyethylene glycol  Physical Exam Vitals and nursing note reviewed.  Cardiovascular:     Rate and Rhythm: Normal rate.  Pulmonary:     Effort: Pulmonary effort is normal.  Neurological:     Mental Status: She is alert and oriented to person, place, and time.  Psychiatric:        Mood and Affect: Mood normal.  Vital Signs: BP (!) 106/53 (BP Location: Right Arm)   Pulse 63   Temp 98.3 F (36.8 C) (Oral)   Resp (!) 26   Ht _0  (1.702 m)   Wt 59.9 kg   SpO2 96%   BMI 20.68 kg/m  SpO2: SpO2: 96 % O2 Device: O2 Device: Nasal Cannula O2 Flow Rate: O2 Flow Rate (L/min): 2 L/min  Intake/output summary:   Intake/Output Summary (Last 24 hours) at 03/01/2019 1231 Last data filed at 03/01/2019 1018 Gross per 24 hour  Intake 120 ml  Output 1250 ml  Net -1130 ml   LBM: Last BM Date: 02/24/19 Baseline Weight: Weight: 61.3 kg Most recent weight: Weight: 59.9 kg       Palliative Assessment/Data: PPS: 20%     Patient Active Problem List   Diagnosis Date Noted  . Ileus, postoperative ([place])   . HCAP (healthcare-associated pneumonia)   . Advanced care planning/counseling discussion   . Acute renal failure superimposed on chronic kidney disease ([place])   . Palliative care by specialist   . Goals of care, counseling/discussion   . Small bowel obstruction ([place]) 03/01/2019  . Vomiting 03/14/2019  . ARF (acute renal failure) ([place]) 03/08/2019  . SBO (small bowel obstruction) ([place]) 03/06/2019  . Hip fracture ([place]) 02/19/2019  . Protein-calorie malnutrition, severe 02/16/2019  . Closed hip fracture, right, initial encounter ([place]) 02/15/2019  . Stage 3a chronic kidney disease 02/15/2019  . Back pain  02/21/2018  . Compression fracture of lumbar vertebra ([place]) 02/21/2018  . Osteoporosis 02/21/2018  . Pulmonary hypertension ([place]) 02/20/2018  . Cardiac pacemaker in situ 02/24/2016  . Persistent atrial fibrillation ([place])   . Chronic venous insufficiency 09/11/2015  . Edema 09/11/2015  . Carotid artery disease ([place]) 03/12/2015  . Diverticulosis of colon without hemorrhage 08/02/2012  . UTI (lower urinary tract infection) 02/03/2012  . Hypertension 04/14/2011  . Ovarian cyst, left 12/29/2010  . pT2pN0 (0/12 lymph nodes) cM0 Rectal Cancer 11/27/2010  . Mononeuritis 07/31/2008  . PULMONARY FIBROSIS, POSTINFLAMMATORY 07/31/2008  . Diaphragmatic hernia 07/31/2008  . Mitral valve disorder 04/20/2007  . HYPERCHOLESTEROLEMIA 04/19/2007  . Anxiety state 04/19/2007  . GERD 04/19/2007  . Osteoarthritis 04/19/2007  . ALLERGY 04/19/2007    Palliative Care Assessment & Plan   Patient Profile: 85 y.o.femalewith past medical history of rectal cancer, a fib, a fib with pacemaker, HTN, HLD, chronic renal insufficiency, pulmonary fibrosis, hip fracture- was recovering in CIR-admitted on12/1/2020with nausea and vomiting- workup reveals small bowel obstruction- likely illeus from recent hip surgery.Has NG placed. Palliative medicine team consulted for [place] and code status discussion.Illeus improving, however, patient now with multifocal pneumonia- likely from aspiration.  Assessment/Recommendations/Plan   Patient with limited improvement, respiratory status looks better, she states she feels better, wishes to continue current care  Plan to re-eval patient tomorrow for progress/decline and continue [place] discussion  Goals of Care and Additional Recommendations:  Limitations on Scope of Treatment: Full Scope Treatment  Code Status:  DNR  Prognosis:   Unable to determine  Discharge Planning:  To Be Determined  Care plan was discussed with patient and daughter- [name]  Thank you for  allowing the Palliative Medicine Team to assist in the care of this patient.   Time In: 1230 Time Out: 1305 Total Time 35 mins Prolonged Time Billed no      Greater than 50%  of this time was spent counseling and coordinating care related to the above assessment and plan.  Mariana Kaufman, AGNP-C Palliative Medicine  Please contact Palliative Medicine Team phone at [phone] for questions and concerns.

## 2019-03-01 NOTE — Care Management Important Message (Signed)
Important Message  Patient Details  Name: Hannah Mercado MRN: [id number] Date of Birth: 1923-01-08   Medicare Important Message Given:  Yes     Memory Argue 03/01/2019, 3:14 PM

## 2019-03-02 ENCOUNTER — Inpatient Hospital Stay (HOSPITAL_COMMUNITY): Payer: Medicare Other

## 2019-03-02 LAB — BASIC METABOLIC PANEL
Anion gap: 17 — ABNORMAL HIGH (ref 5–15)
BUN: 54 mg/dL — ABNORMAL HIGH (ref 8–23)
CO2: 23 mmol/L (ref 22–32)
Calcium: 7.8 mg/dL — ABNORMAL LOW (ref 8.9–10.3)
Chloride: 105 mmol/L (ref 98–111)
Creatinine, Ser: 1.76 mg/dL — ABNORMAL HIGH (ref 0.44–1.00)
GFR calc Af Amer: 28 mL/min — ABNORMAL LOW (ref >60)
GFR calc non Af Amer: 24 mL/min — ABNORMAL LOW (ref >60)
Glucose, Bld: 102 mg/dL — ABNORMAL HIGH (ref 70–99)
Potassium: 4.3 mmol/L (ref 3.5–5.1)
Sodium: 145 mmol/L (ref 135–145)

## 2019-03-02 LAB — CBC WITH DIFFERENTIAL/PLATELET
Abs Immature Granulocytes: 0.58 10*3/uL — ABNORMAL HIGH (ref 0.00–0.07)
Basophils Absolute: 0.1 10*3/uL (ref 0.0–0.1)
Basophils Relative: 0 %
Eosinophils Absolute: 0 10*3/uL (ref 0.0–0.5)
Eosinophils Relative: 0 %
HCT: 29.2 % — ABNORMAL LOW (ref 36.0–46.0)
Hemoglobin: 8.9 g/dL — ABNORMAL LOW (ref 12.0–15.0)
Immature Granulocytes: 2 %
Lymphocytes Relative: 3 %
Lymphs Abs: 1.1 10*3/uL (ref 0.7–4.0)
MCH: 34.2 pg — ABNORMAL HIGH (ref 26.0–34.0)
MCHC: 30.5 g/dL (ref 30.0–36.0)
MCV: 112.3 fL — ABNORMAL HIGH (ref 80.0–100.0)
Monocytes Absolute: 0.9 10*3/uL (ref 0.1–1.0)
Monocytes Relative: 3 %
Neutro Abs: 30.7 10*3/uL — ABNORMAL HIGH (ref 1.7–7.7)
Neutrophils Relative %: 92 %
Platelets: 361 10*3/uL (ref 150–400)
RBC: 2.6 MIL/uL — ABNORMAL LOW (ref 3.87–5.11)
RDW: 16.2 % — ABNORMAL HIGH (ref 11.5–15.5)
WBC: 33.4 10*3/uL — ABNORMAL HIGH (ref 4.0–10.5)
nRBC: 1 % — ABNORMAL HIGH (ref 0.0–0.2)

## 2019-03-02 LAB — GLUCOSE, CAPILLARY
Glucose-Capillary: 102 mg/dL — ABNORMAL HIGH (ref 70–99)
Glucose-Capillary: 114 mg/dL — ABNORMAL HIGH (ref 70–99)
Glucose-Capillary: 128 mg/dL — ABNORMAL HIGH (ref 70–99)

## 2019-03-02 MED ORDER — PIPERACILLIN-TAZOBACTAM 3.375 G IVPB: 3.3750 g | Freq: Two times a day (BID) | INTRAVENOUS | Status: DC

## 2019-03-23 NOTE — Plan of Care (Signed)

## 2019-03-23 NOTE — Progress Notes (Signed)
PHARMACY NOTE:  ANTIMICROBIAL RENAL DOSAGE ADJUSTMENT  Current antimicrobial regimen includes a mismatch between antimicrobial dosage and estimated renal function.  As per policy approved by the Pharmacy & Therapeutics and Medical Executive Committees, the antimicrobial dosage will be adjusted accordingly.  Current antimicrobial dosage:  Zosyn 3.375g IV q8h (infuse over 4 hours)   Indication: pneumonia/sepsis  Renal Function:  Estimated Creatinine Clearance: 18.5 mL/min (A) (by C-G formula based on SCr of 1.76 mg/dL (H)).     Antimicrobial dosage has been changed to:  Zosyn 3.375g IV q12h (infuse over 4 hours)  Thank you for allowing pharmacy to be a part of this patient's care.  Candie Mile, [facility] 03-11-2019 8:36 AM

## 2019-03-23 NOTE — Progress Notes (Signed)
I visited Pt for spiritual support. Hannah Mercado was resting but alert. I offered Hannah Mercado space to talk. She mentioned that she was at peace with the decisions she made a while back about her health care and end of life decisions but has concerns that her family will take it hard. She said that is what she is worried about. She also stated she was ready for heaven and to be with the [place].  I asked her how she felt. She said she did not feel any pain, but felt very tired.  I was pastorially present and offered Nate spiritual support with words of comfort, prayer and ministry of presence. She requested that we continue to prayer for her and her family. I checked in with her Nurse about Hannah Mercado wanting to change position in the bed. Nurse stated she will be right in to tend to her. Hannah Mercado stated she was very thankful for the care she has had.   Chaplain Resident Hannah Mercado [phone]

## 2019-03-23 NOTE — Discharge Summary (Signed)
Death Summary  Hannah Mercado YQM:[id number] DOB: Sep 08, 1922 DOA: 03-17-2019  PCP: Patient, No Pcp Per  Admit date: 2019-03-17 Date of Death: March 27, 2019  Final Diagnoses:  Principal Problem:   Small bowel obstruction ([place]) Active Problems:   Hypertension   Persistent atrial fibrillation ([place])   Pulmonary hypertension (HCC)   Protein-calorie malnutrition, severe   Vomiting   ARF (acute renal failure) (HCC)   SBO (small bowel obstruction) ([place])   Palliative care by specialist   Goals of care, counseling/discussion   Advanced care planning/counseling discussion   Acute renal failure superimposed on chronic kidney disease ([place])   HCAP (healthcare-associated pneumonia)   Ileus, postoperative ([place])     History of present illness:  86 year old female who presented with nausea and vomiting. She does have significant past medical history for atrial fibrillation with complete heart block status post pacemaker, coronary artery disease, and rectal cancer. Patient had a recent right hip fracture s/p surgical intervention November 28, she was discharged to inpatient rehab. On Mar 17, 2023 she had to be transferred back to the hospital due to coffee-ground emesis, abdominal distention and abdominal pain. Work-up with abdominal films showed small bowel obstruction.On her initial physical examination blood pressure was 128/56, heart rate 63, temperature 98.3, oxygen saturation 98%, her lungs are clear to auscultation, heart S1-S2 present rhythm, abdomen was distended, no guarding or rigidity, no lower extremity edema. CT of the abdomen and pelvis showed changes consistent with small bowel obstruction in the right deep pelvis likely related to additions.  Patient was admitted to the hospital withtheworking diagnosis of small bowel obstruction.  Patient has been managed conservatively, with intravenous fluids, azithromycin and nothing by mouth. Difficult to place nasogastric tube. She  has been diagnosed with multifocal pneumonia and a urinary tract infection, receiving antibiotic therapy with no major complications.  Her kidney function and leukocytosis have been worsening. Continue to be very weak and deconditioned. Her bowel function is not recovering. Palliative care was consulted for goals of care, patient not improving on medical treatment. Patient continue to vomit, leukocytosis persist, acidosis got worse. She was on IV antibiotics. Patient likely develops arrhythmia, and subsequently died. Nurse was informed by telemetry of wide complex arrhythmia, when she went to check on patient patient was unresponsive, no pulse ,, no breathing. Patient died at 58;30.    1. Postoperative ileus with small bowel obstruction. Persistent ileus, patient continue to have large emesis. She is NPO per surgery recommendations. Continue to have mild distended abdomen. Abdominal film from today with density in stomach. Patient decline NG tube. Surgery was following.   2. Acute on chronic hypoxic respiratory failure, due to multifocal pneumonia, and acute pulmonary edema (present on admission). Continue to have tachypnea rr 26, oxygenation 96% on 2 LPM per [place],pending wbc for today but yesterday was significantly elevated, changed antibiotic therapy to zosyn. Continue bronchodilator therapy. Aspiration precautions.   Patient is very weak and deconditioned, she continue to have a high risk for worsening hypoxic respiratory failure.  3. Urinary  tract infection, serratia, present on admission. She has received therapy with ceftriaxone. On  Zosyn.  4. AKI on CKD stage 3b/ hypernatremia and hyperkalemia/ with anemia of chronic renal disease. Will continue to avoid hypotension and nephrotoxic medications. holding lasix due to worsening renal function.   5. Atrial fibrillation.She has remained rate controlled, continue telemetry monitoring.   6. Hip fracture sp repair 11/20. Continue to be  very weak and deconditioned, she has a very poor prognosis.  7. Unspecified calorie protein malnutrition. NPO.       Time: 2;30, March 17, 2019  Signed:  Jerald Mercado A Hannah Mercado  [facility] 03/17/2019, 2:39 PM

## 2019-03-23 NOTE — Progress Notes (Signed)
[facility]/Trauma Progress Note      Assessment/Plan HTN CAD Pacemaker in place Pulmonary fibrosis/pulmonary HTN CKD A fib on eliquis (last dose 02/18/19) - eliquison hold Code status DNR Right intertrochanteric femur fracture s/p IMN 02/17/19 by Dr. Lyla Glassing Hiatal hernia AKI UTI  Multifocal PNA   SBO vs Ileus -initially felt this morelikely ileus related to recent orthopedic surgery - multiple prior abdominal surgeries:Laparoscopic Left oophorectomyandlow anterior resection11/19/2012 by Dr. Barry Dienes for rectal cancer; appendectomy; surgery for ectopic pregnancy; vaginal hysterectomy -Started on erythromycin 250 mg IV every 6 for motility 12/3>>12/9 - passing gas per pt but emesis - 800cc emesis yesterday am and small amount of green emesis this am - repeat AXR pending  ID -rocephin 12/6>>12/9, flagyl 12/8>>12/9, zosyn 12/9>> VTE -SCDs, eliquison hold/ok for heparin gtt FEN -IVF,NPO, ice chips Follow up -TBD  Plan:AXR pending, continue NPO in setting of emesis. Will need to decide if pt needs IV nutrition based on GOC. It has been >7days of little PO intake. If no improvement pt may need surgery and she is agreeable if her children agree also. Palliative following and appreciate their assistance.    LOS: 10 days    Subjective: CC: emesis this am  Pt denies nausea or abdominal pain at this time. She states her belly only hurts "if you mash on it". She endorses bloating and states she is having flatus. I asked pt if she would be willing to have surgery if needed. She said yes. She stated that she knows its a risk. She understands she cannot live like this not being able to eat.   Objective: Vital signs in last 24 hours: Temp:  [97.4 F (36.3 C)-97.5 F (36.4 C)] 97.4 F (36.3 C) (12/11 0619) Pulse Rate:  [61-71] 61 (12/11 0619) Resp:  [16-22] 16 (12/11 0619) BP: (105-124)/(46-68) 124/48 (12/11 0619) SpO2:  [92 %-96 %] 95 % (12/11  0619) Last BM Date: 02/25/19  Intake/Output from previous day: 12/10 0701 - 12/11 0700 In: 250 [P.O.:100; IV Piggyback:150] Out: 1150 [Urine:350; Emesis/NG output:800] Intake/Output this shift: No intake/output data recorded.  PE:  Gen:  Alert, NAD, pleasant, cooperative Pulm:  Rate increased, effort normal Abd: Soft, mild distention, hypoactive BS, generalized TTP without guarding. No peritonitis  Skin: no rashes noted, warm and dry   Anti-infectives: Anti-infectives (From admission, onward)   Start     Dose/Rate Route Frequency Ordered Stop   Mar 13, 2019 1800  piperacillin-tazobactam (ZOSYN) IVPB 3.375 g     3.375 g 12.5 mL/hr over 240 Minutes Intravenous Every 12 hours 13-Mar-2019 0835     02/28/19 1200  piperacillin-tazobactam (ZOSYN) IVPB 3.375 g  Status:  Discontinued     3.375 g 12.5 mL/hr over 240 Minutes Intravenous Every 8 hours 02/28/19 1131 13-Mar-2019 0835   02/27/19 1300  metroNIDAZOLE (FLAGYL) IVPB 500 mg  Status:  Discontinued     500 mg 100 mL/hr over 60 Minutes Intravenous Every 8 hours 02/27/19 1251 02/28/19 1131   02/25/19 1445  cefTRIAXone (ROCEPHIN) 1 g in sodium chloride 0.9 % 100 mL IVPB  Status:  Discontinued     1 g 200 mL/hr over 30 Minutes Intravenous Every 24 hours 02/25/19 1441 02/28/19 1121   02/22/19 1230  erythromycin 250 mg in sodium chloride 0.9 % 100 mL IVPB  Status:  Discontinued     250 mg 100 mL/hr over 60 Minutes Intravenous Every 6 hours 02/22/19 1130 02/28/19 1121      Lab Results:  Recent Labs    03/01/19  1431 March 21, 2019 0220  WBC 34.4* 33.4*  HGB 9.0* 8.9*  HCT 30.4* 29.2*  PLT 347 361   BMET Recent Labs    03/01/19 1431 21-Mar-2019 0220  NA 145 145  K 3.4* 4.3  CL 107 105  CO2 24 23  GLUCOSE 131* 102*  BUN 48* 54*  CREATININE 1.68* 1.76*  CALCIUM 7.5* 7.8*   PT/INR No results for input(s): LABPROT, INR in the last 72 hours. CMP     Component Value Date/Time   NA 145 2019/03/21 0220   K 4.3 2019/03/21 0220   CL 105  03-21-19 0220   CO2 23 Mar 21, 2019 0220   GLUCOSE 102 (H) 2019/03/21 0220   BUN 54 (H) 2019-03-21 0220   CREATININE 1.76 (H) 03/21/19 0220   CREATININE 1.08 (H) 10/09/2015 1130   CALCIUM 7.8 (L) 03-21-19 0220   PROT 5.3 (L) 02/28/2019 0251   ALBUMIN 2.0 (L) 02/28/2019 0251   AST 21 02/28/2019 0251   ALT 17 02/28/2019 0251   ALKPHOS 69 02/28/2019 0251   BILITOT 1.1 02/28/2019 0251   GFRNONAA 24 (L) 2019/03/21 0220   GFRAA 28 (L) 21-Mar-2019 0220   Lipase  No results found for: LIPASE  Studies/Results: DG Abd Portable 1V  Result Date: 02/28/2019 CLINICAL DATA:  Small bowel obstruction. EXAM: PORTABLE ABDOMEN - 1 VIEW COMPARISON:  02/27/2019 FINDINGS: The enteric tube which terminated in the region of the distal esophagus on the prior study is no longer visible. No dilated loops of bowel are seen to suggest obstruction. Cardiomegaly and a pacemaker are noted. L2, L3, and L4 compression fractures are again seen. IMPRESSION: No evidence of bowel obstruction. Electronically Signed   By: Logan Bores M.D.   On: 02/28/2019 12:04     Kalman Drape, [facility] Surgery Please see amion for pager for the following: Cristine Polio, & Friday 7:00am - 4:30pm Thursdays 7:00am -11:30am

## 2019-03-23 NOTE — Progress Notes (Signed)
Patient refused NG tube placement.

## 2019-03-23 NOTE — Progress Notes (Signed)
Daily Progress Note   Patient Name: Hannah Mercado       Date: 03/19/2019 DOB: 1923/02/05  Age: 86 y.o. MRN#: [id number] Attending Physician: Elmarie Shiley, MD Primary Care Physician: Patient, No Pcp Per Admit Date: 03/14/2019  Reason for Consultation/Follow-up: Establishing goals of care  Subjective: Patient seen this morning, awake, tired. [place] discussion was had, but Finley was unable to tell me her preferences. I discussed with her concerns that surgery may cause her more suffering in the long run. Continued aggressive medical care, TPN, medications, IV fluids, vs comfort care and "going to see Jesus" (her words) were discussed. Hannah Mercado could only tell me that her children know what she wants. She then told me she knew everyone in the hospital was taking good care of her, and that she was going to "take a nap now".  I returned to Hannah Mercado's room this afternoon to see her again prior to calling her daughter and Hannah Mercado had just died. Per RN report she was called for an arrhythmia by telemetry and when she went to see patient, she had died. Patient appeared peaceful and no signs of distress prior to her dying.  I called Hannah Mercado who had just spoken to RN and received new of her mother's death. Gave emotional support to Hannah Mercado. Hannah Mercado expressed gratitude that her mother did not suffer. She was thankful for care her mother had received.   Review of Systems  Constitutional: Positive for malaise/fatigue and weight loss.    Length of Stay: 10  Current Medications: Scheduled Meds:  . bisacodyl  10 mg Rectal Daily  . Chlorhexidine Gluconate Cloth  6 each Topical Daily  . docusate sodium  100 mg Oral BID  . lip balm  1 application Topical BID  . multivitamin with minerals  1 tablet Oral  Daily  . pantoprazole (PROTONIX) IV  40 mg Intravenous Q12H    Continuous Infusions: . piperacillin-tazobactam (ZOSYN)  IV      PRN Meds: acetaminophen, albuterol, alum & mag hydroxide-simeth, diphenhydrAMINE, hydrALAZINE, magic mouthwash, menthol-cetylpyridinium, ondansetron **OR** ondansetron (ZOFRAN) IV, phenol, polyethylene glycol  Physical Exam Vitals and nursing note reviewed.  Cardiovascular:     Pulses: Normal pulses.  Pulmonary:     Effort: Pulmonary effort is normal.  Neurological:     Mental Status: She is alert and oriented  to person, place, and time.             Physical exam on my return visit revealed patient with no pulse and no heartbeat.  Vital Signs: BP (!) 124/48 (BP Location: Left Arm)   Pulse 61   Temp (!) 97.4 F (36.3 C) (Oral)   Resp 16   Ht 5\' 7"  (1.702 m)   Wt 59.9 kg   SpO2 95%   BMI 20.68 kg/m  SpO2: SpO2: 95 % O2 Device: O2 Device: Nasal Cannula O2 Flow Rate: O2 Flow Rate (L/min): 2 L/min  Intake/output summary:   Intake/Output Summary (Last 24 hours) at 2019/03/30 1509 Last data filed at 2019-03-30 7867 Gross per 24 hour  Intake 50 ml  Output 350 ml  Net -300 ml   LBM: Last BM Date: 02/25/19 Baseline Weight: Weight: 61.3 kg Most recent weight: Weight: 59.9 kg       Palliative Assessment/Data: PPS: 0%      Patient Active Problem List   Diagnosis Date Noted  . Ileus, postoperative ([place])   . HCAP (healthcare-associated pneumonia)   . Advanced care planning/counseling discussion   . Acute renal failure superimposed on chronic kidney disease ([place])   . Palliative care by specialist   . Goals of care, counseling/discussion   . Small bowel obstruction ([place]) 03/04/2019  . Vomiting 03/15/2019  . ARF (acute renal failure) ([place]) 02/26/2019  . SBO (small bowel obstruction) ([place]) 02/23/2019  . Hip fracture ([place]) 02/19/2019  . Protein-calorie malnutrition, severe 02/16/2019  . Closed hip fracture, right, initial encounter ([place])  02/15/2019  . Stage 3a chronic kidney disease 02/15/2019  . Back pain 02/21/2018  . Compression fracture of lumbar vertebra ([place]) 02/21/2018  . Osteoporosis 02/21/2018  . Pulmonary hypertension ([place]) 02/20/2018  . Cardiac pacemaker in situ 02/24/2016  . Persistent atrial fibrillation ([place])   . Chronic venous insufficiency 09/11/2015  . Edema 09/11/2015  . Carotid artery disease ([place]) 03/12/2015  . Diverticulosis of colon without hemorrhage 08/02/2012  . UTI (lower urinary tract infection) 02/03/2012  . Hypertension 04/14/2011  . Ovarian cyst, left 12/29/2010  . pT2pN0 (0/12 lymph nodes) cM0 Rectal Cancer 11/27/2010  . Mononeuritis 07/31/2008  . PULMONARY FIBROSIS, POSTINFLAMMATORY 07/31/2008  . Diaphragmatic hernia 07/31/2008  . Mitral valve disorder 04/20/2007  . HYPERCHOLESTEROLEMIA 04/19/2007  . Anxiety state 04/19/2007  . GERD 04/19/2007  . Osteoarthritis 04/19/2007  . ALLERGY 04/19/2007    Palliative Care Assessment & Plan   Patient Profile: 86 y.o.femalewith past medical history of rectal cancer, a fib, a fib with pacemaker, HTN, HLD, chronic renal insufficiency, pulmonary fibrosis, hip fracture- was recovering in CIR-admitted on12/1/2020with nausea and vomiting- workup reveals small bowel obstruction- likely illeus from recent hip surgery.Has NG placed. Palliative medicine team consulted for [place] and code status discussion.Illeus improving, however, patient now with multifocal pneumonia- likely from aspiration.   Assessment/Recommendations/Plan   Patient has died   Code Status:  DNR   Care plan was discussed with Wandra Feinstein.   Thank you for allowing the Palliative Medicine Team to assist in the care of this patient.   Time In: 1030, 1445 Time Out: 1105, 1500 Total Time 35 mins Prolonged Time Billed no      Greater than 50%  of this time was spent counseling and coordinating care related to the above assessment and plan.  Mariana Kaufman,  AGNP-C Palliative Medicine   Please contact Palliative Medicine Team phone at [phone] for questions and concerns.

## 2019-03-23 NOTE — Progress Notes (Signed)
Patient was noted unresponsive, no respirations, no pulse, patient is a DNR, expired at 14:30, verified by 2 nurses. Physician was notified, patient's daughter Brooks Sailors was also made aware. [facility] was also notified.

## 2019-03-23 DEATH — deceased

## 2019-12-27 IMAGING — DX DG CHEST 1V PORT
1 series · 2 of 2 positions shown · non-contrast
Comparison: 03/18/2018

CLINICAL DATA: Fall, hip fracture

EXAM:
PORTABLE CHEST 1 VIEW

[Series 1: chest · 0.14mm/px · 2 of 2 slices shown]
[im 1/2]
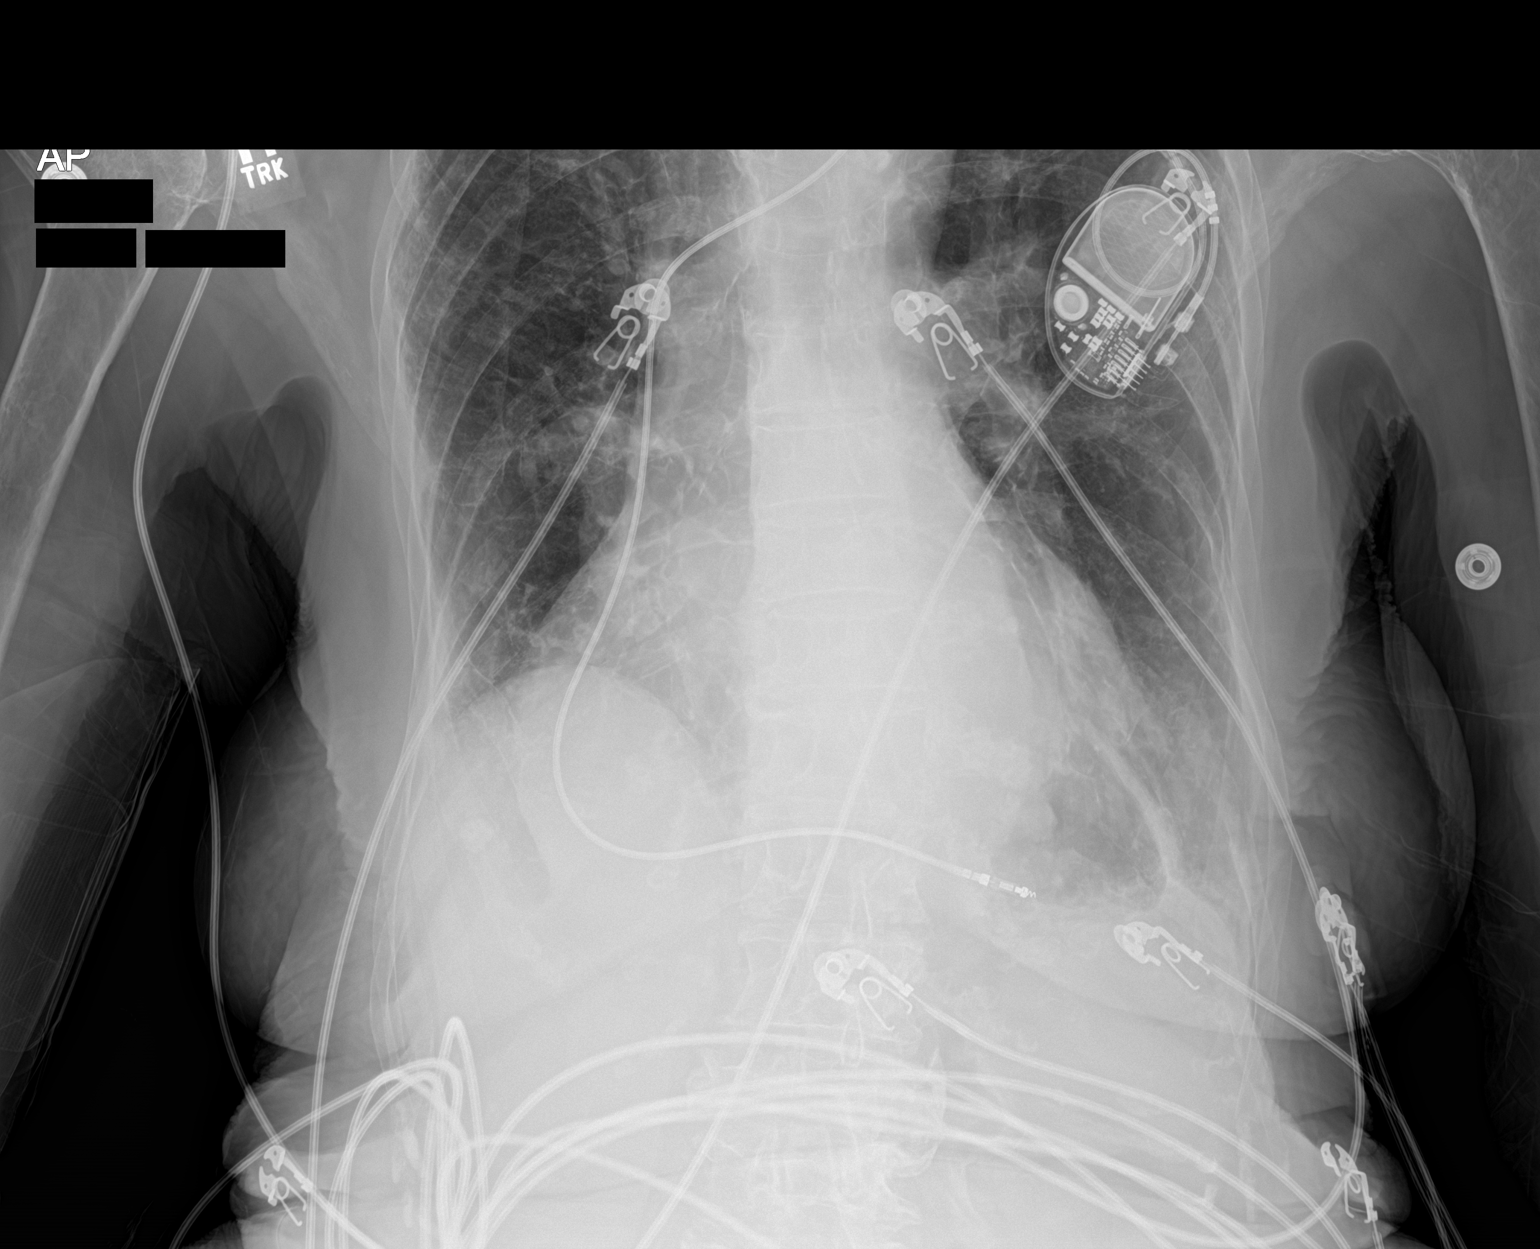
[im 2/2]
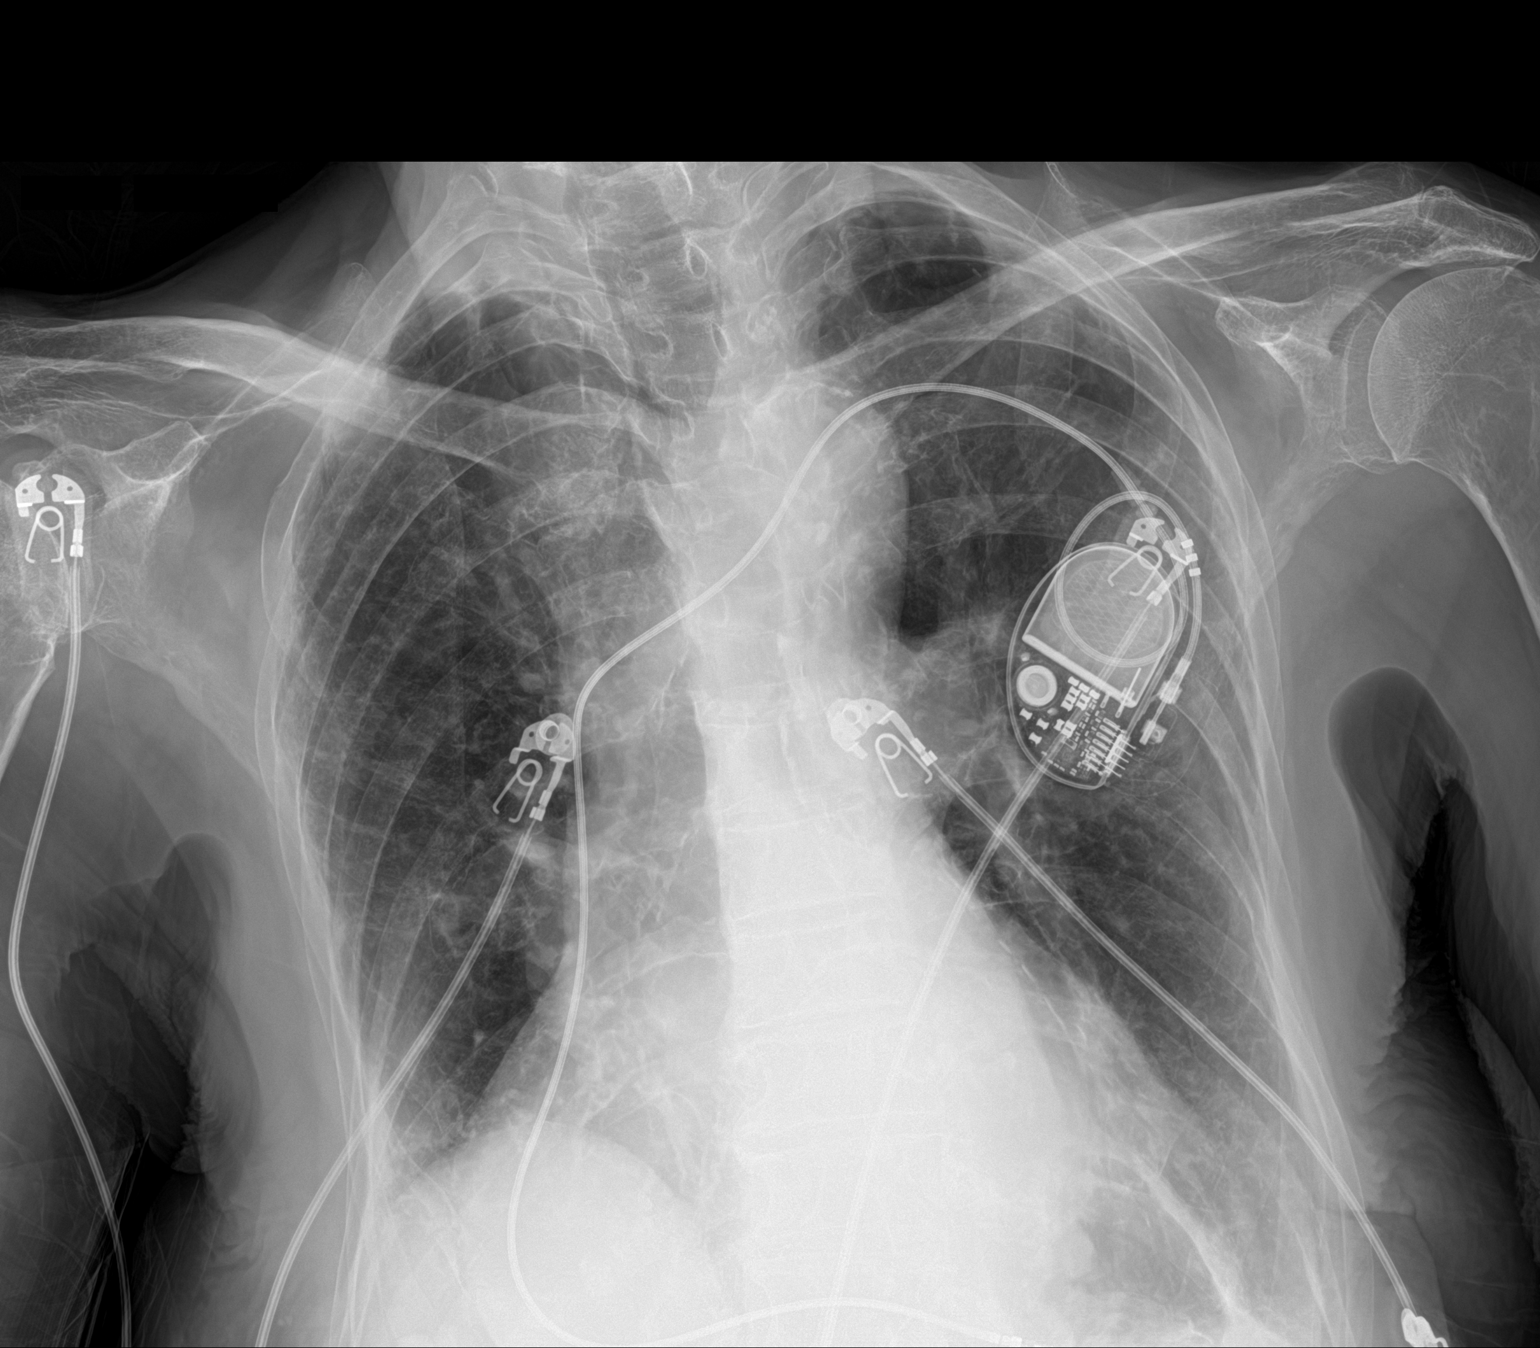

[2 of 2 positions shown; findings below may reference images not displayed]

FINDINGS: Increased interstitial markings. No focal consolidation. Left lower
lobe scarring. No pleural effusion or pneumothorax.

Cardiomegaly.  Left subclavian pacemaker.
IMPRESSION: No evidence of acute cardiopulmonary disease.

## 2019-12-27 IMAGING — DX DG HIP (WITH OR WITHOUT PELVIS) 1V PORT*R*
2 series · 3 of 3 positions shown · non-contrast
Comparison: None.

CLINICAL DATA: Fall, right hip deformity

EXAM:
DG HIP (WITH OR WITHOUT PELVIS) 1V PORT RIGHT

[pelvis]
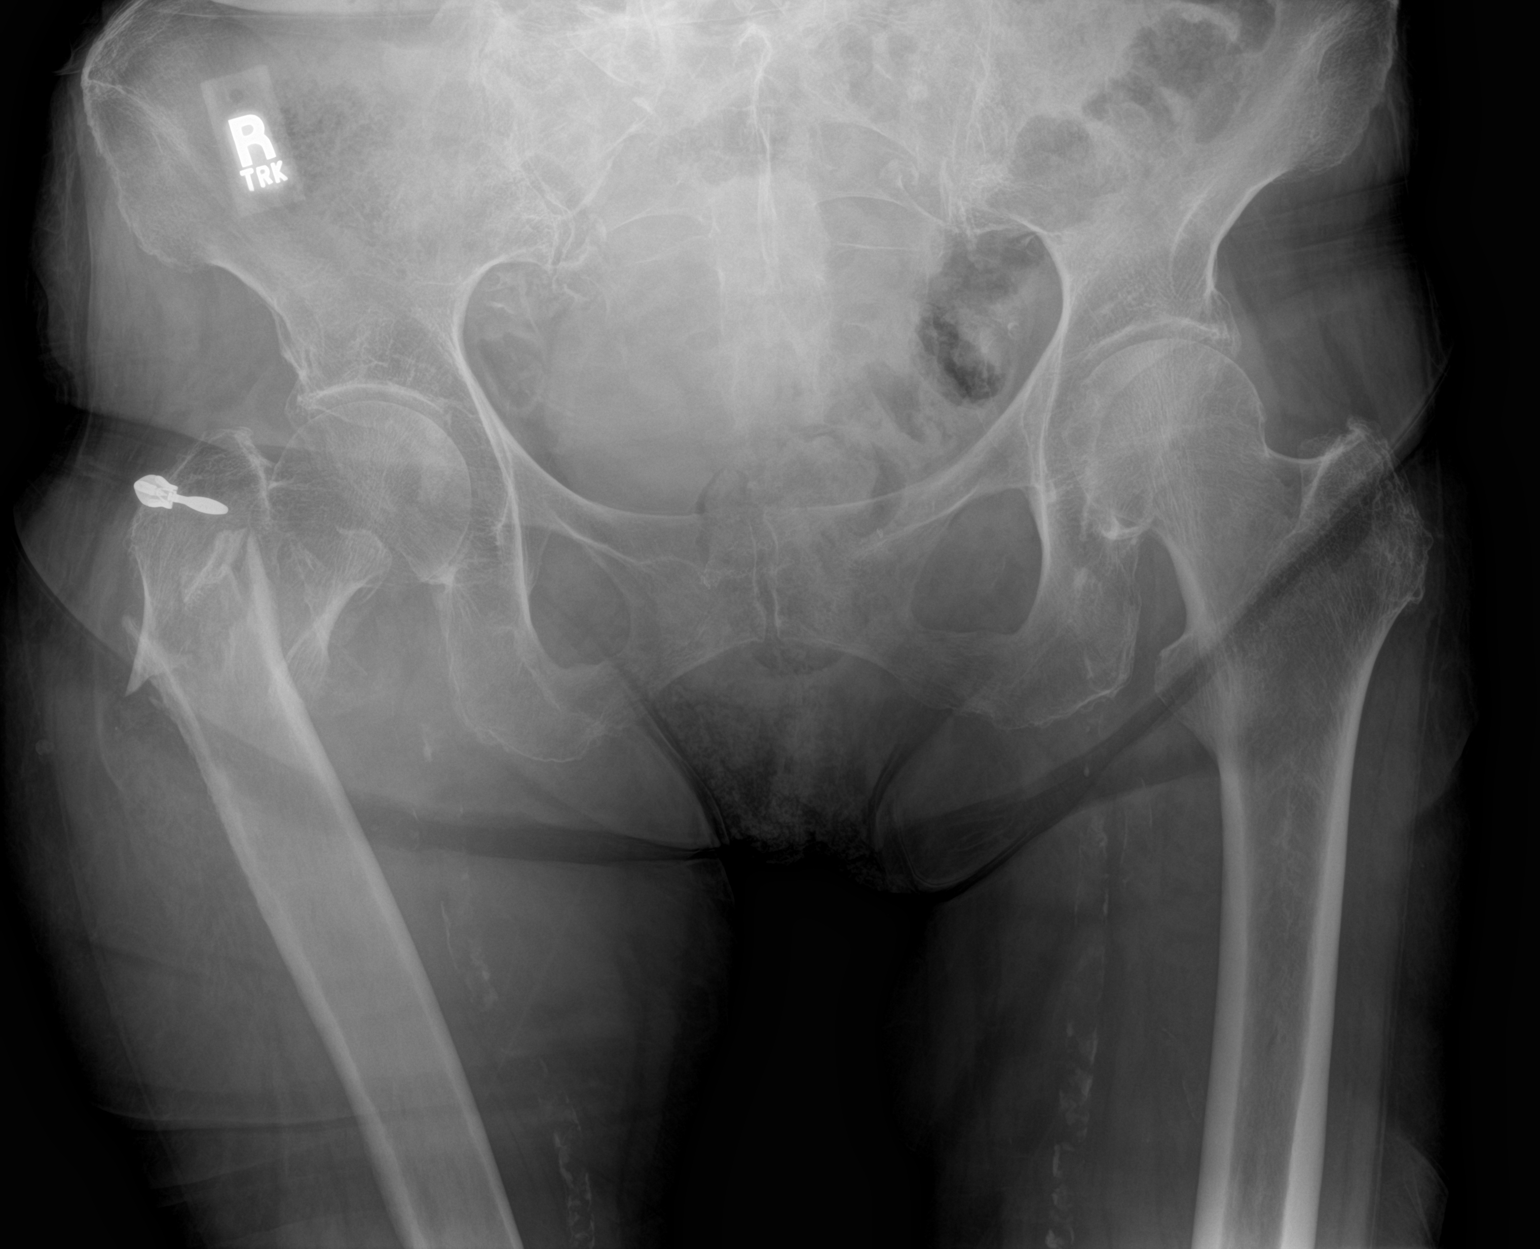

[Series 2: hip · 0.14mm/px · 2 of 2 slices shown]
[im 1/2]
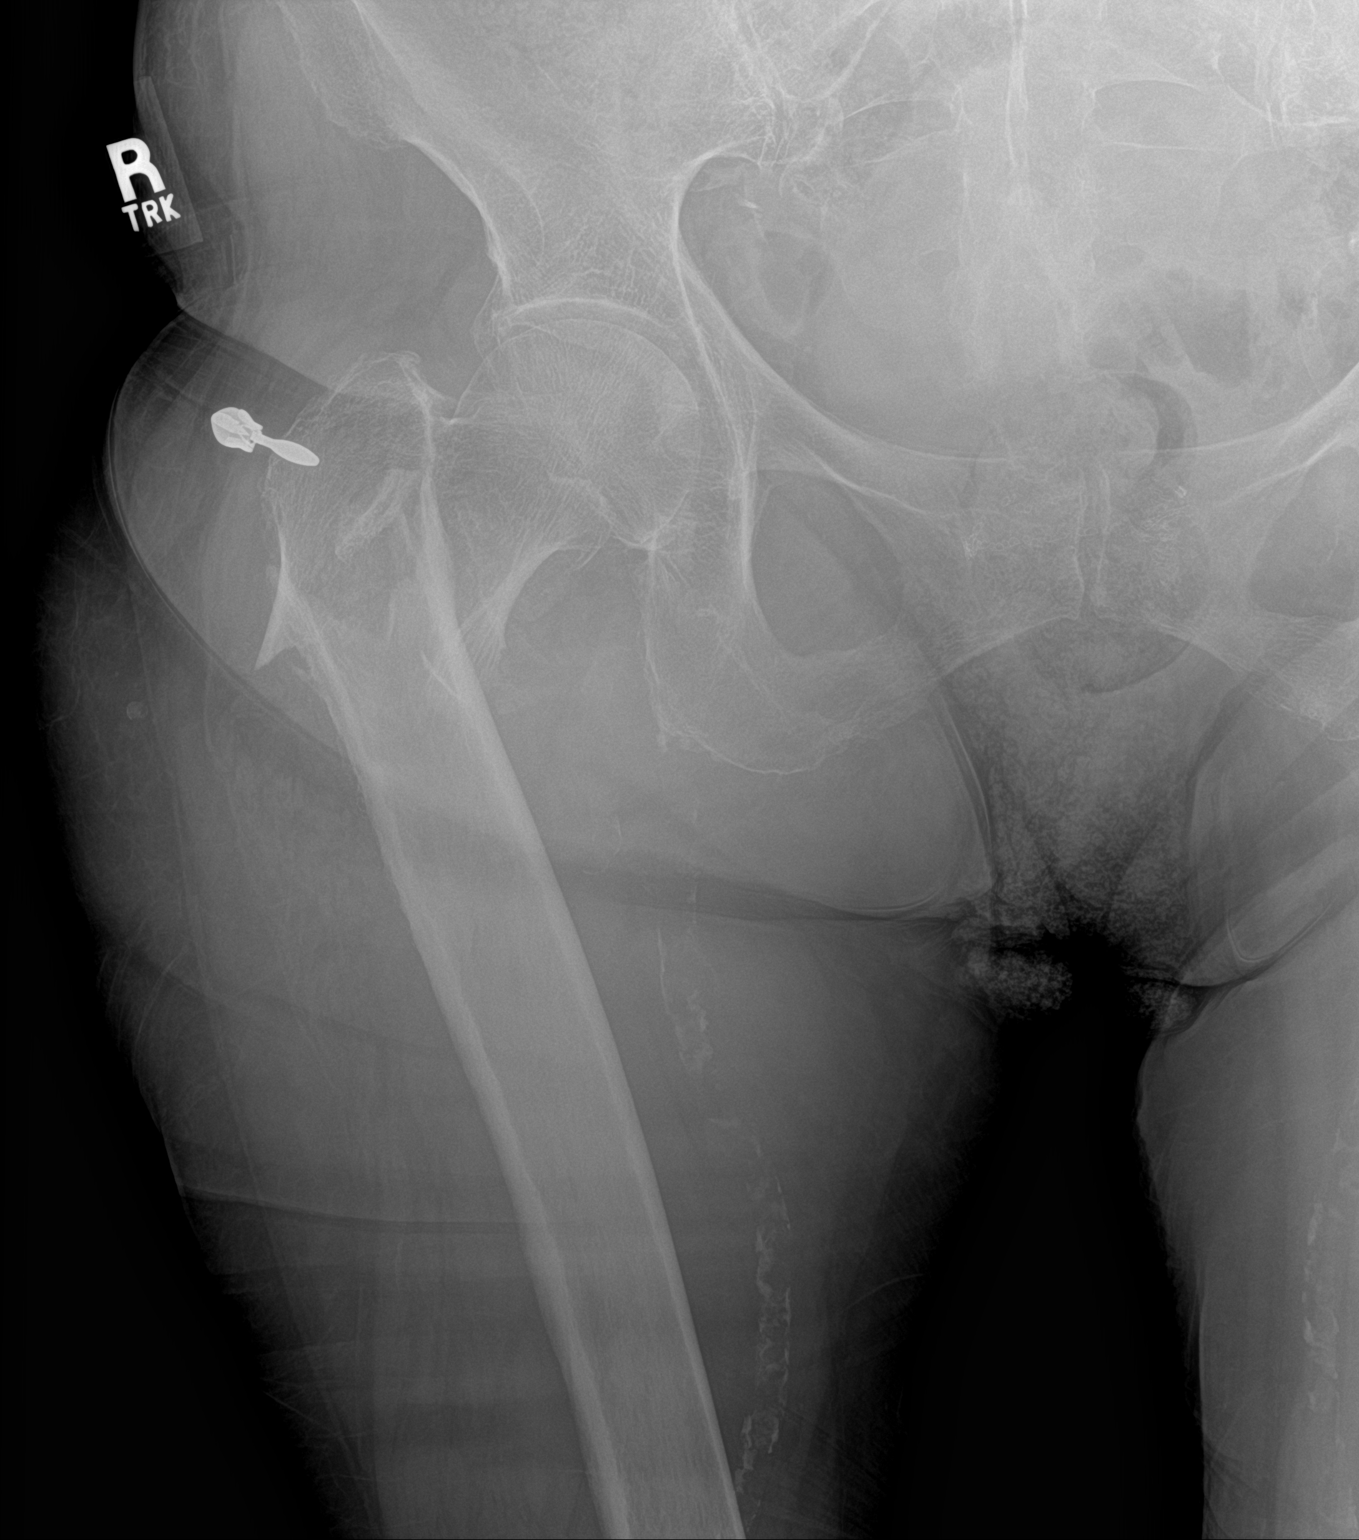
[im 2/2]
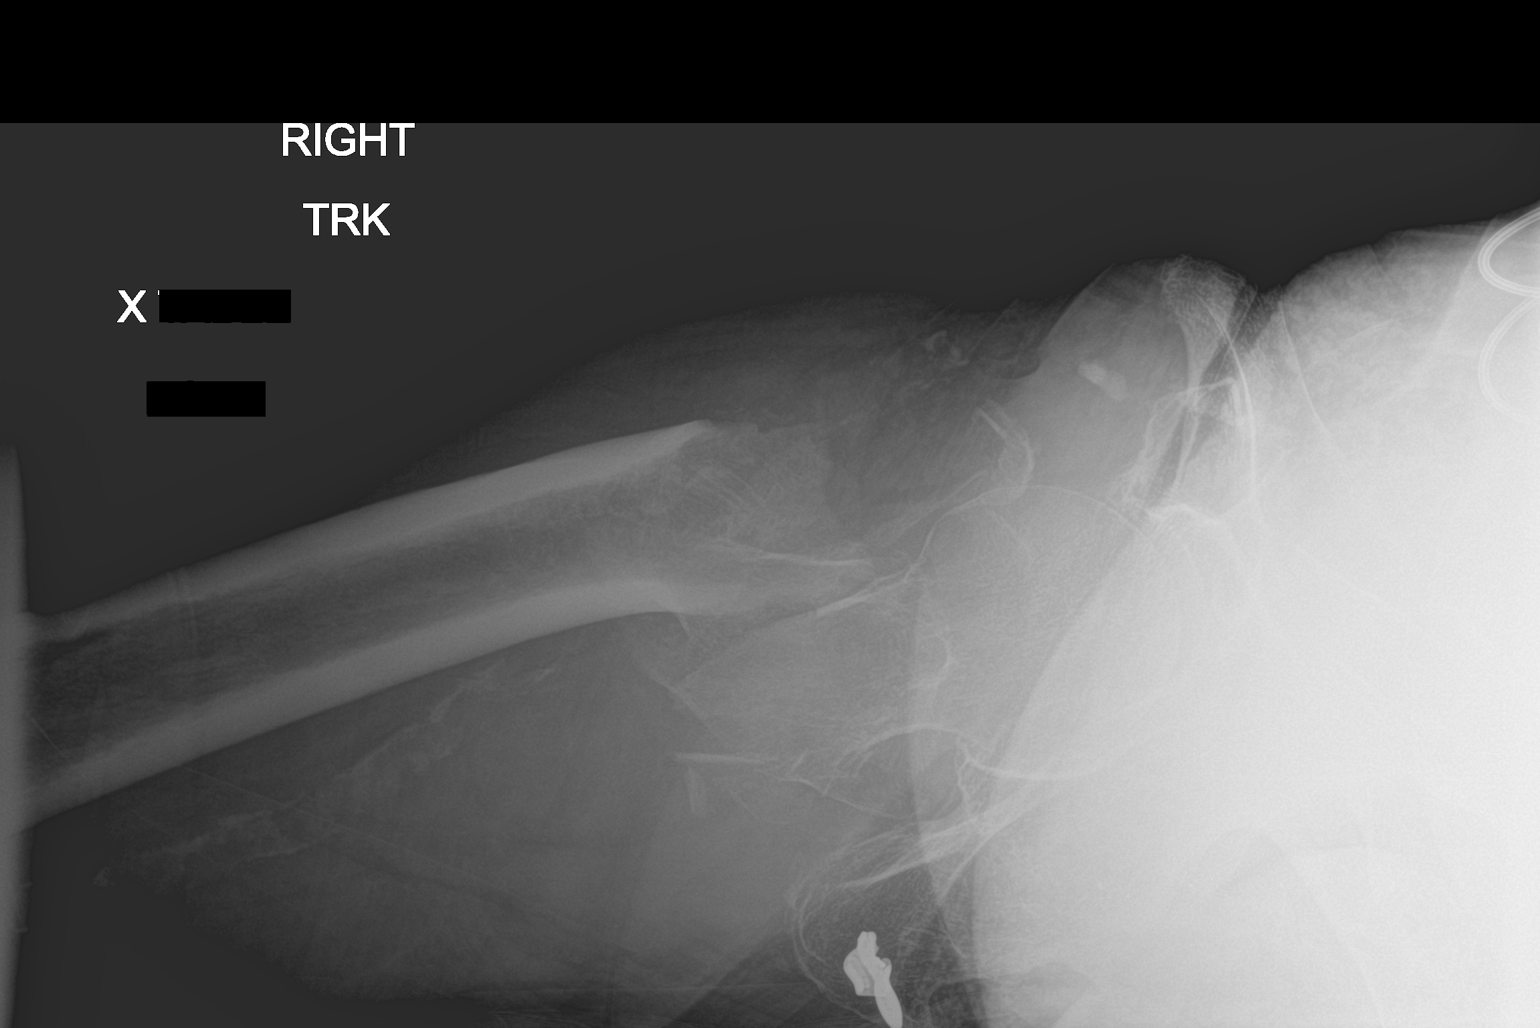

[3 of 3 positions shown; findings below may reference images not displayed]

FINDINGS: Comminuted intertrochanteric right hip fracture. Varus angulation
and foreshortening. Displaced lesser trochanteric fragment.

Left hip is intact.  Bilateral hip joint spaces are preserved.

Visualized bony pelvis appears intact.
IMPRESSION: Comminuted intertrochanteric right hip fracture, as above.

## 2019-12-29 IMAGING — RF DG C-ARM 1-60 MIN
1 series · 4 of 4 positions shown · non-contrast
Comparison: None.

CLINICAL DATA: IM nail RIGHT femur

EXAM:
DG C-ARM 1-60 MIN; RIGHT FEMUR 2 VIEWS

[Series 1: run · 4 of 4 slices shown]
[im 1/4]
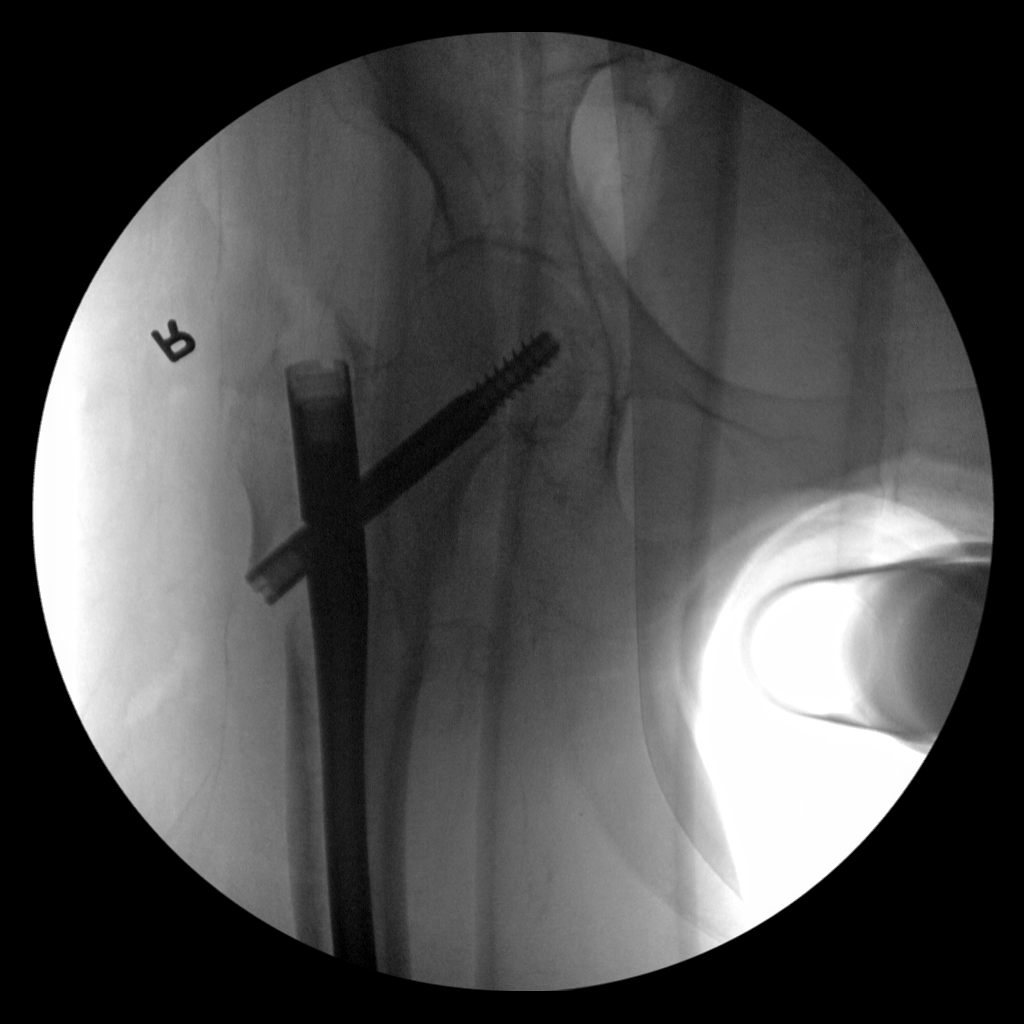
[im 2/4]
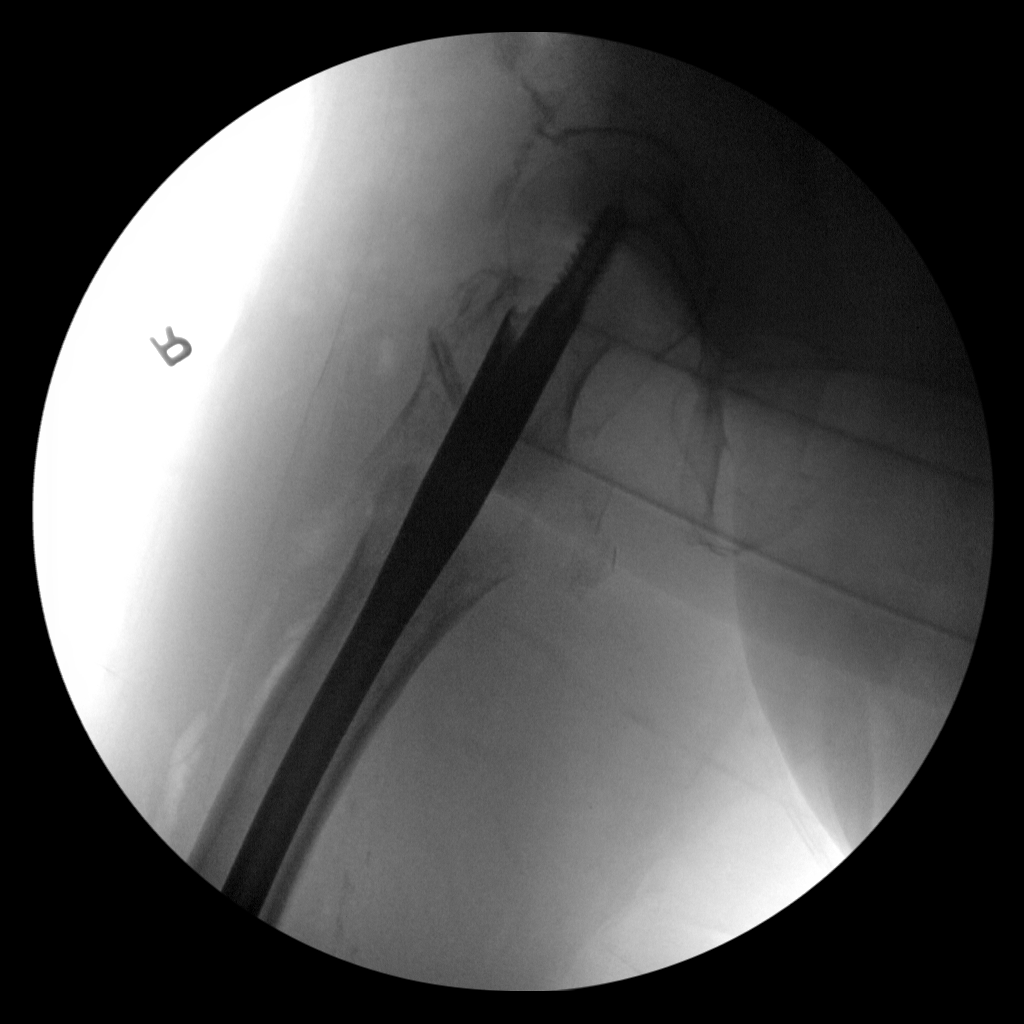
[im 3/4]
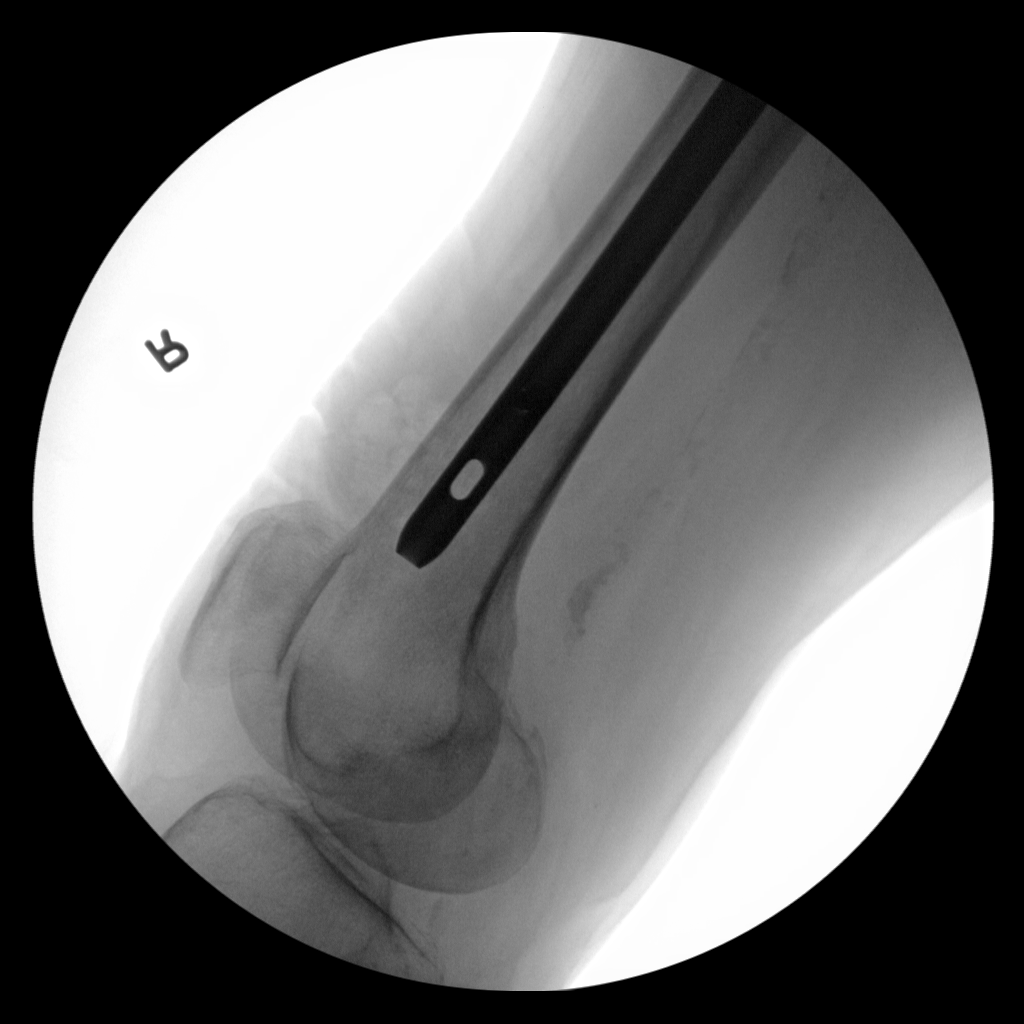
[im 4/4]
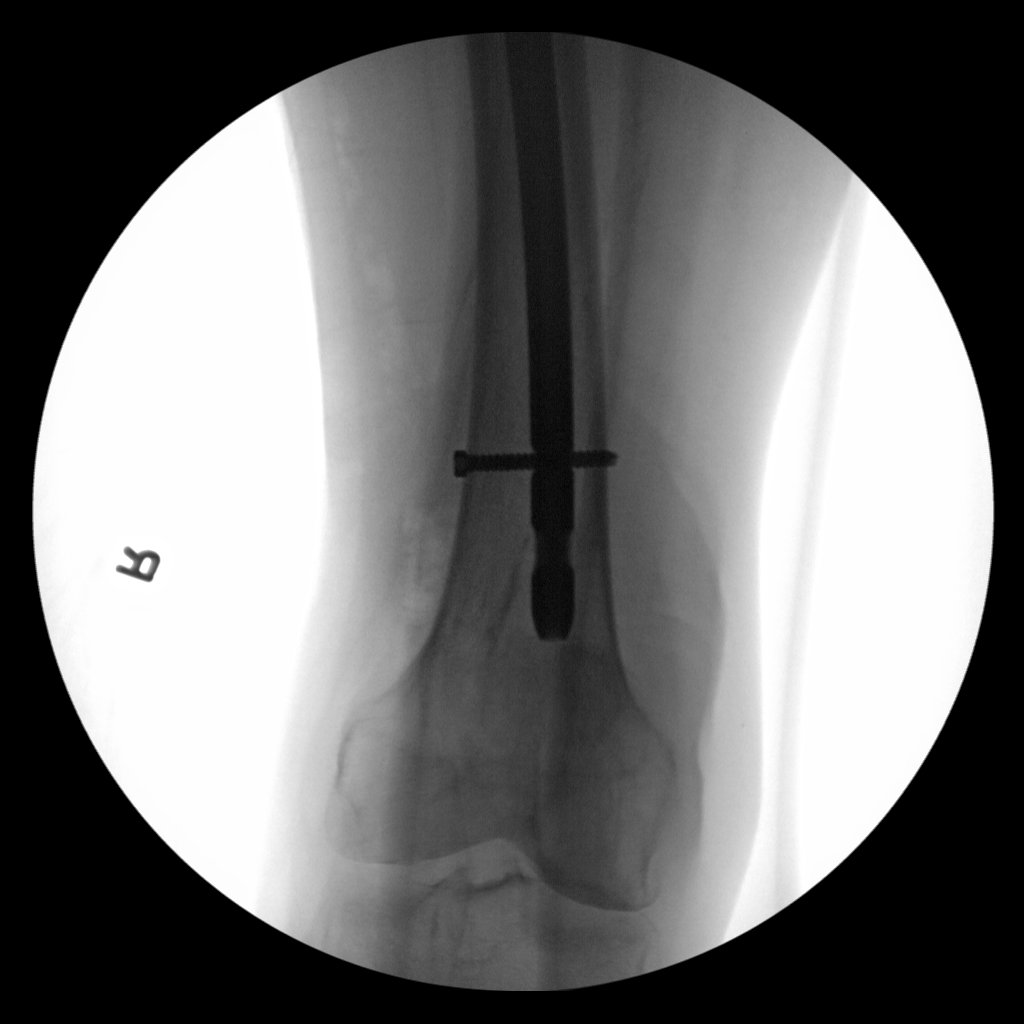

[4 of 4 positions shown; findings below may reference images not displayed]

FINDINGS: Four intraoperative fluoroscopic images are provided demonstrating
IM nail placement within the RIGHT femur. Hardware appears intact
and appropriately positioned. Osseous alignment is anatomic.
Fluoroscopy provided for 1 minutes 58 seconds.
IMPRESSION: Intraoperative fluoroscopic images demonstrating intramedullary nail
placement within the RIGHT femur. No evidence of surgical
complicating feature.

## 2019-12-29 IMAGING — RF DG FEMUR 2+V*R*
1 series · 4 of 4 positions shown · non-contrast
Comparison: None.

CLINICAL DATA: IM nail RIGHT femur

EXAM:
DG C-ARM 1-60 MIN; RIGHT FEMUR 2 VIEWS

[Series 1: run · 4 of 4 slices shown]
[im 1/4]
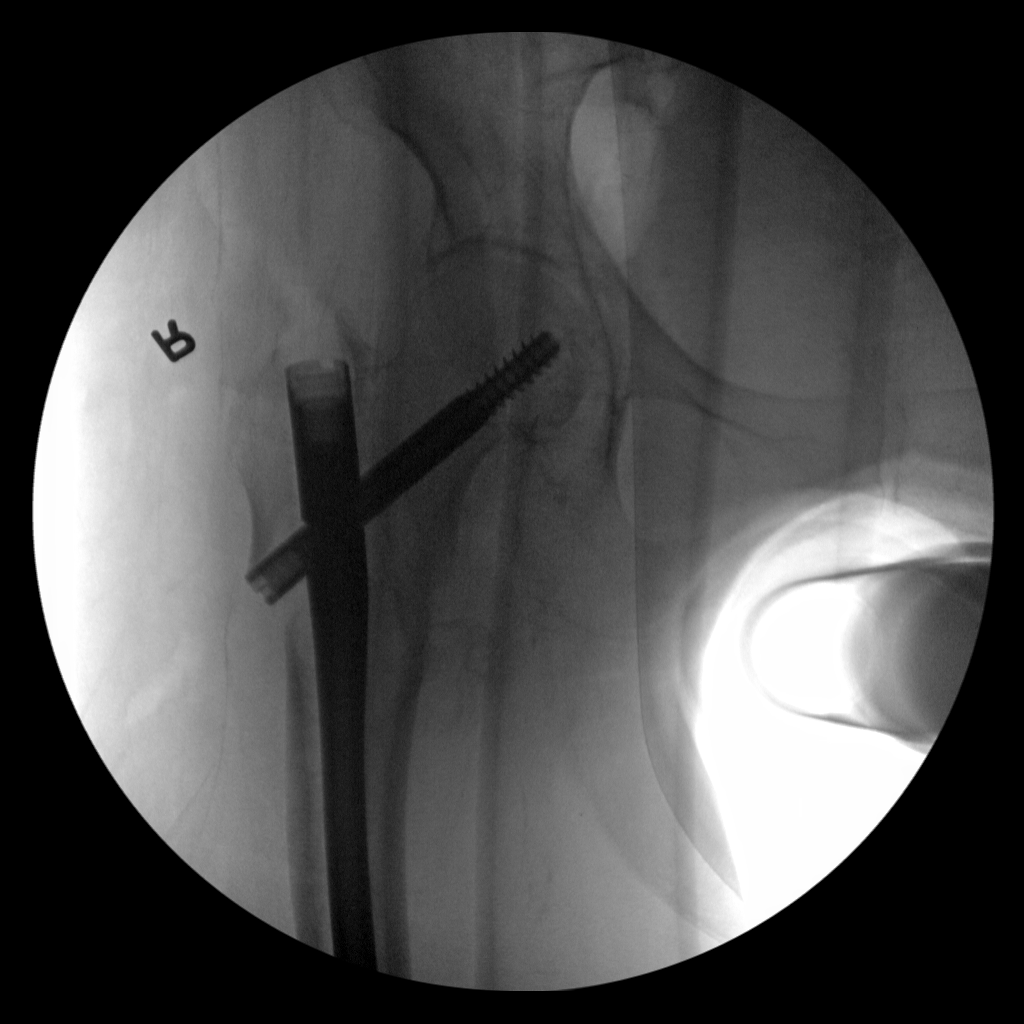
[im 2/4]
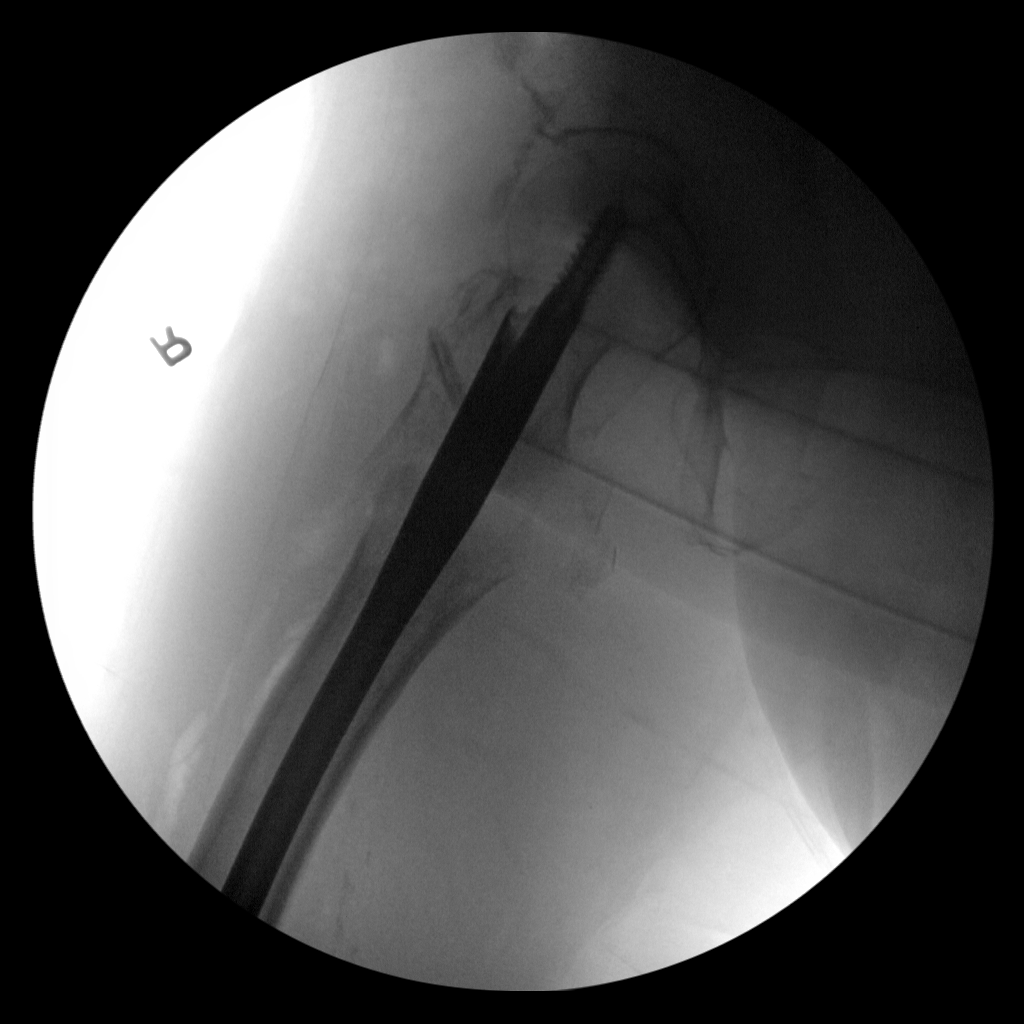
[im 3/4]
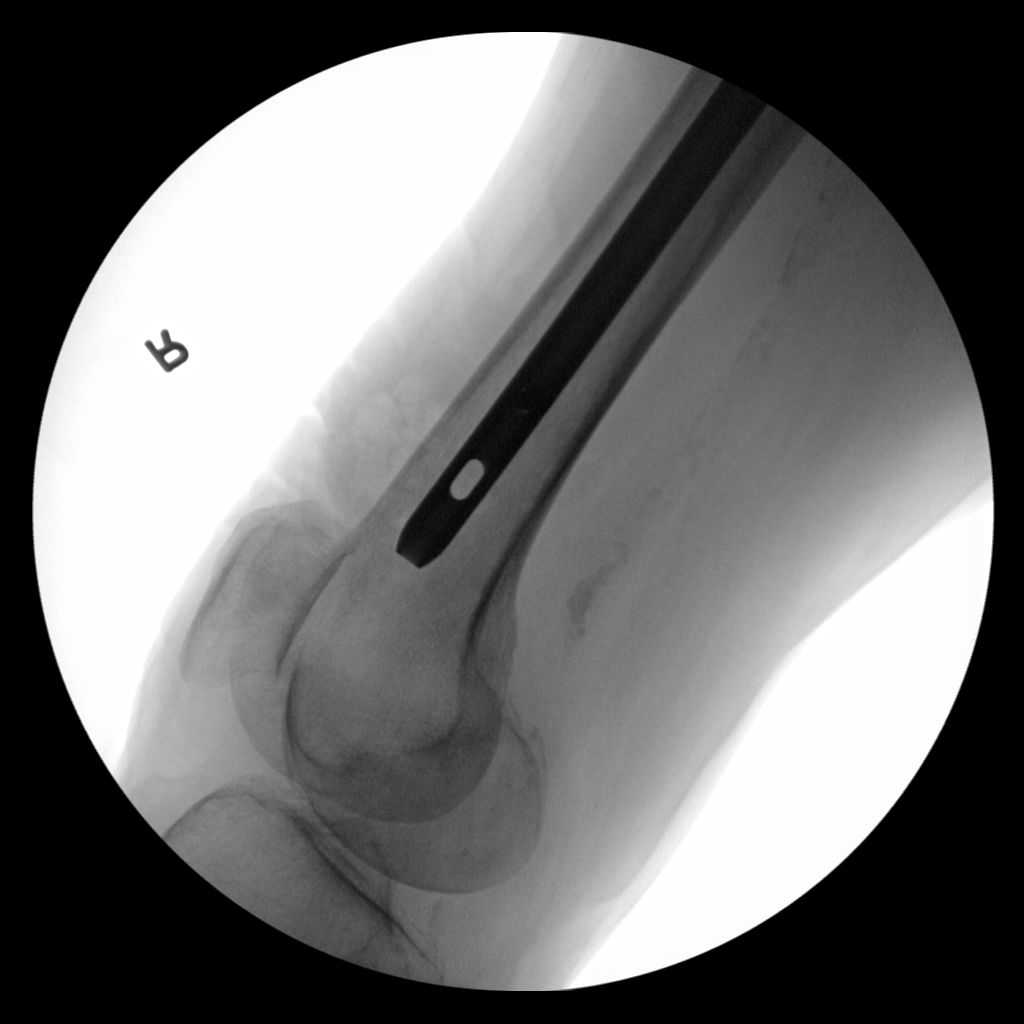
[im 4/4]
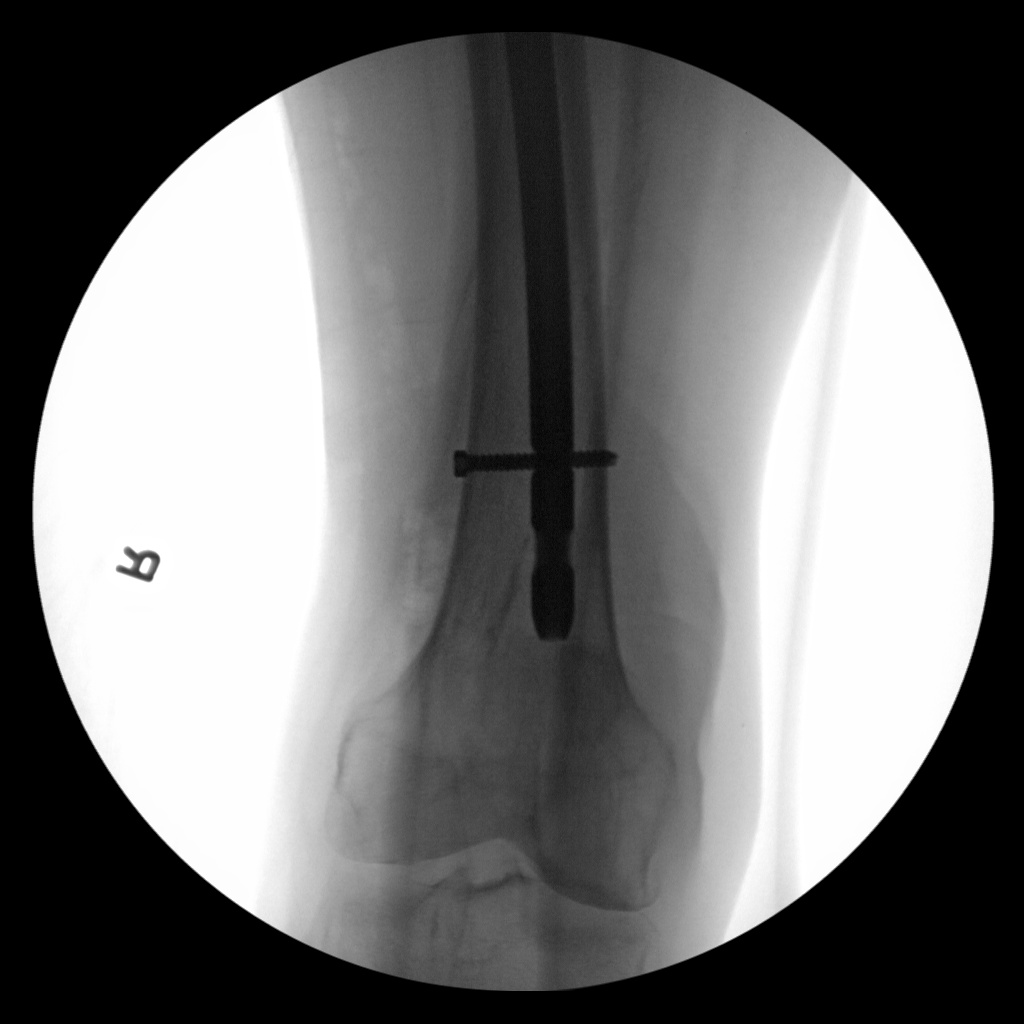

[4 of 4 positions shown; findings below may reference images not displayed]

FINDINGS: Four intraoperative fluoroscopic images are provided demonstrating
IM nail placement within the RIGHT femur. Hardware appears intact
and appropriately positioned. Osseous alignment is anatomic.
Fluoroscopy provided for 1 minutes 58 seconds.
IMPRESSION: Intraoperative fluoroscopic images demonstrating intramedullary nail
placement within the RIGHT femur. No evidence of surgical
complicating feature.
